# Patient Record
Sex: Female | Born: 1955
Health system: Southern US, Community
[De-identification: ages and names within clinical notes are randomized; demographics above are authoritative.]

## PROBLEM LIST (undated history)

## (undated) DIAGNOSIS — Z8042 Family history of malignant neoplasm of prostate: Secondary | ICD-10-CM

## (undated) DIAGNOSIS — F32A Depression, unspecified: Secondary | ICD-10-CM

## (undated) DIAGNOSIS — H9319 Tinnitus, unspecified ear: Secondary | ICD-10-CM

## (undated) DIAGNOSIS — J302 Other seasonal allergic rhinitis: Secondary | ICD-10-CM

## (undated) DIAGNOSIS — D751 Secondary polycythemia: Secondary | ICD-10-CM

## (undated) DIAGNOSIS — E6609 Other obesity due to excess calories: Secondary | ICD-10-CM

## (undated) DIAGNOSIS — K529 Noninfective gastroenteritis and colitis, unspecified: Secondary | ICD-10-CM

## (undated) DIAGNOSIS — E079 Disorder of thyroid, unspecified: Secondary | ICD-10-CM

## (undated) DIAGNOSIS — R5383 Other fatigue: Secondary | ICD-10-CM

## (undated) DIAGNOSIS — R61 Generalized hyperhidrosis: Secondary | ICD-10-CM

## (undated) DIAGNOSIS — T4145XA Adverse effect of unspecified anesthetic, initial encounter: Secondary | ICD-10-CM

## (undated) DIAGNOSIS — Z87440 Personal history of urinary (tract) infections: Secondary | ICD-10-CM

## (undated) DIAGNOSIS — Z803 Family history of malignant neoplasm of breast: Secondary | ICD-10-CM

## (undated) DIAGNOSIS — R112 Nausea with vomiting, unspecified: Secondary | ICD-10-CM

## (undated) DIAGNOSIS — Z8489 Family history of other specified conditions: Secondary | ICD-10-CM

## (undated) DIAGNOSIS — E039 Hypothyroidism, unspecified: Secondary | ICD-10-CM

## (undated) DIAGNOSIS — G4700 Insomnia, unspecified: Secondary | ICD-10-CM

## (undated) DIAGNOSIS — Z923 Personal history of irradiation: Secondary | ICD-10-CM

## (undated) DIAGNOSIS — I499 Cardiac arrhythmia, unspecified: Secondary | ICD-10-CM

## (undated) DIAGNOSIS — G8929 Other chronic pain: Secondary | ICD-10-CM

## (undated) DIAGNOSIS — I639 Cerebral infarction, unspecified: Secondary | ICD-10-CM

## (undated) DIAGNOSIS — K649 Unspecified hemorrhoids: Secondary | ICD-10-CM

## (undated) DIAGNOSIS — Z86718 Personal history of other venous thrombosis and embolism: Secondary | ICD-10-CM

## (undated) DIAGNOSIS — R2689 Other abnormalities of gait and mobility: Secondary | ICD-10-CM

## (undated) DIAGNOSIS — R296 Repeated falls: Secondary | ICD-10-CM

## (undated) DIAGNOSIS — M1990 Unspecified osteoarthritis, unspecified site: Secondary | ICD-10-CM

## (undated) DIAGNOSIS — Z87898 Personal history of other specified conditions: Secondary | ICD-10-CM

## (undated) DIAGNOSIS — Z9289 Personal history of other medical treatment: Secondary | ICD-10-CM

## (undated) DIAGNOSIS — Z72820 Sleep deprivation: Secondary | ICD-10-CM

## (undated) DIAGNOSIS — F419 Anxiety disorder, unspecified: Secondary | ICD-10-CM

## (undated) DIAGNOSIS — J189 Pneumonia, unspecified organism: Secondary | ICD-10-CM

## (undated) DIAGNOSIS — Z8619 Personal history of other infectious and parasitic diseases: Secondary | ICD-10-CM

## (undated) DIAGNOSIS — M545 Low back pain, unspecified: Secondary | ICD-10-CM

## (undated) DIAGNOSIS — Z9889 Other specified postprocedural states: Secondary | ICD-10-CM

## (undated) DIAGNOSIS — Z8709 Personal history of other diseases of the respiratory system: Secondary | ICD-10-CM

## (undated) DIAGNOSIS — Z87442 Personal history of urinary calculi: Secondary | ICD-10-CM

## (undated) DIAGNOSIS — G459 Transient cerebral ischemic attack, unspecified: Secondary | ICD-10-CM

## (undated) DIAGNOSIS — T8859XA Other complications of anesthesia, initial encounter: Secondary | ICD-10-CM

## (undated) DIAGNOSIS — W19XXXA Unspecified fall, initial encounter: Secondary | ICD-10-CM

## (undated) DIAGNOSIS — F329 Major depressive disorder, single episode, unspecified: Secondary | ICD-10-CM

## (undated) DIAGNOSIS — G4730 Sleep apnea, unspecified: Secondary | ICD-10-CM

## (undated) DIAGNOSIS — Z8719 Personal history of other diseases of the digestive system: Secondary | ICD-10-CM

## (undated) DIAGNOSIS — I10 Essential (primary) hypertension: Secondary | ICD-10-CM

## (undated) DIAGNOSIS — T7840XA Allergy, unspecified, initial encounter: Secondary | ICD-10-CM

## (undated) DIAGNOSIS — C801 Malignant (primary) neoplasm, unspecified: Secondary | ICD-10-CM

## (undated) HISTORY — DX: Depression, unspecified: F32.A

## (undated) HISTORY — PX: BACK SURGERY: SHX140

## (undated) HISTORY — DX: Allergy, unspecified, initial encounter: T78.40XA

## (undated) HISTORY — PX: BREAST BIOPSY: SHX20

## (undated) HISTORY — PX: TONSILLECTOMY: SUR1361

## (undated) HISTORY — DX: Other chronic pain: G89.29

## (undated) HISTORY — DX: Cerebral infarction, unspecified: I63.9

## (undated) HISTORY — DX: Other obesity due to excess calories: E66.09

## (undated) HISTORY — PX: OTHER SURGICAL HISTORY: SHX169

## (undated) HISTORY — PX: CHOLECYSTECTOMY: SHX55

## (undated) HISTORY — DX: Low back pain, unspecified: M54.50

## (undated) HISTORY — DX: Other seasonal allergic rhinitis: J30.2

## (undated) HISTORY — DX: Sleep apnea, unspecified: G47.30

## (undated) HISTORY — DX: Sleep deprivation: Z72.820

## (undated) HISTORY — PX: ABDOMINAL HYSTERECTOMY: SHX81

## (undated) HISTORY — PX: FRACTURE SURGERY: SHX138

## (undated) HISTORY — DX: Family history of malignant neoplasm of prostate: Z80.42

## (undated) HISTORY — DX: Family history of malignant neoplasm of breast: Z80.3

## (undated) HISTORY — PX: KNEE SURGERY: SHX244

## (undated) HISTORY — DX: Anxiety disorder, unspecified: F41.9

## (undated) HISTORY — DX: Noninfective gastroenteritis and colitis, unspecified: K52.9

## (undated) HISTORY — PX: BREAST LUMPECTOMY: SHX2

## (undated) HISTORY — PX: JOINT REPLACEMENT: SHX530

## (undated) HISTORY — PX: SPINE SURGERY: SHX786

---
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
## 1997-11-04 ENCOUNTER — Ambulatory Visit (HOSPITAL_BASED_OUTPATIENT_CLINIC_OR_DEPARTMENT_OTHER): Admission: RE | Admit: 1997-11-04 | Discharge: 1997-11-04 | Payer: Self-pay | Admitting: Orthopedic Surgery

## 1999-02-02 ENCOUNTER — Other Ambulatory Visit: Admission: RE | Admit: 1999-02-02 | Discharge: 1999-02-02 | Payer: Self-pay | Admitting: Family Medicine

## 1999-04-05 ENCOUNTER — Ambulatory Visit (HOSPITAL_COMMUNITY): Admission: RE | Admit: 1999-04-05 | Discharge: 1999-04-05 | Payer: Self-pay | Admitting: Gastroenterology

## 1999-09-13 ENCOUNTER — Other Ambulatory Visit: Admission: RE | Admit: 1999-09-13 | Discharge: 1999-09-13 | Payer: Self-pay | Admitting: Orthopedic Surgery

## 2000-10-14 ENCOUNTER — Encounter: Payer: Self-pay | Admitting: Emergency Medicine

## 2000-10-14 ENCOUNTER — Inpatient Hospital Stay (HOSPITAL_COMMUNITY): Admission: EM | Admit: 2000-10-14 | Discharge: 2000-10-15 | Payer: Self-pay | Admitting: Emergency Medicine

## 2001-01-02 ENCOUNTER — Encounter: Payer: Self-pay | Admitting: Emergency Medicine

## 2001-01-02 ENCOUNTER — Emergency Department (HOSPITAL_COMMUNITY): Admission: EM | Admit: 2001-01-02 | Discharge: 2001-01-02 | Payer: Self-pay | Admitting: Emergency Medicine

## 2001-01-17 ENCOUNTER — Encounter: Payer: Self-pay | Admitting: Emergency Medicine

## 2001-01-17 ENCOUNTER — Emergency Department (HOSPITAL_COMMUNITY): Admission: EM | Admit: 2001-01-17 | Discharge: 2001-01-18 | Payer: Self-pay | Admitting: Emergency Medicine

## 2001-01-18 ENCOUNTER — Encounter: Payer: Self-pay | Admitting: Emergency Medicine

## 2001-01-24 ENCOUNTER — Encounter: Payer: Self-pay | Admitting: Specialist

## 2001-01-24 ENCOUNTER — Encounter: Admission: RE | Admit: 2001-01-24 | Discharge: 2001-01-24 | Payer: Self-pay | Admitting: Specialist

## 2001-02-08 ENCOUNTER — Encounter: Payer: Self-pay | Admitting: Orthopedic Surgery

## 2001-02-09 ENCOUNTER — Inpatient Hospital Stay (HOSPITAL_COMMUNITY): Admission: RE | Admit: 2001-02-09 | Discharge: 2001-02-10 | Payer: Self-pay | Admitting: Orthopedic Surgery

## 2001-02-16 ENCOUNTER — Ambulatory Visit (HOSPITAL_COMMUNITY): Admission: RE | Admit: 2001-02-16 | Discharge: 2001-02-16 | Payer: Self-pay | Admitting: Cardiology

## 2001-02-25 ENCOUNTER — Inpatient Hospital Stay (HOSPITAL_COMMUNITY): Admission: EM | Admit: 2001-02-25 | Discharge: 2001-03-01 | Payer: Self-pay | Admitting: Emergency Medicine

## 2001-03-13 ENCOUNTER — Ambulatory Visit (HOSPITAL_COMMUNITY): Admission: RE | Admit: 2001-03-13 | Discharge: 2001-03-13 | Payer: Self-pay | Admitting: Cardiology

## 2001-04-16 ENCOUNTER — Encounter: Admission: RE | Admit: 2001-04-16 | Discharge: 2001-04-16 | Payer: Self-pay | Admitting: *Deleted

## 2001-04-16 ENCOUNTER — Encounter: Payer: Self-pay | Admitting: *Deleted

## 2001-05-14 ENCOUNTER — Encounter: Admission: RE | Admit: 2001-05-14 | Discharge: 2001-05-14 | Payer: Self-pay | Admitting: *Deleted

## 2001-05-14 ENCOUNTER — Encounter: Payer: Self-pay | Admitting: *Deleted

## 2001-05-19 ENCOUNTER — Emergency Department (HOSPITAL_COMMUNITY): Admission: EM | Admit: 2001-05-19 | Discharge: 2001-05-19 | Payer: Self-pay | Admitting: Emergency Medicine

## 2001-05-20 ENCOUNTER — Ambulatory Visit (HOSPITAL_COMMUNITY): Admission: RE | Admit: 2001-05-20 | Discharge: 2001-05-20 | Payer: Self-pay | Admitting: Emergency Medicine

## 2001-05-20 ENCOUNTER — Encounter: Payer: Self-pay | Admitting: Emergency Medicine

## 2001-07-27 ENCOUNTER — Encounter: Payer: Self-pay | Admitting: *Deleted

## 2001-07-27 ENCOUNTER — Encounter: Admission: RE | Admit: 2001-07-27 | Discharge: 2001-07-27 | Payer: Self-pay | Admitting: *Deleted

## 2001-09-05 ENCOUNTER — Encounter: Payer: Self-pay | Admitting: Physical Medicine and Rehabilitation

## 2001-09-05 ENCOUNTER — Encounter
Admission: RE | Admit: 2001-09-05 | Discharge: 2001-09-05 | Payer: Self-pay | Admitting: Physical Medicine and Rehabilitation

## 2001-11-28 ENCOUNTER — Ambulatory Visit (HOSPITAL_COMMUNITY): Admission: RE | Admit: 2001-11-28 | Discharge: 2001-11-28 | Payer: Self-pay | Admitting: Internal Medicine

## 2001-12-08 ENCOUNTER — Emergency Department: Admission: EM | Admit: 2001-12-08 | Discharge: 2001-12-09 | Payer: Self-pay

## 2001-12-19 ENCOUNTER — Encounter
Admission: RE | Admit: 2001-12-19 | Discharge: 2001-12-19 | Payer: Self-pay | Admitting: Physical Medicine and Rehabilitation

## 2001-12-19 ENCOUNTER — Encounter: Payer: Self-pay | Admitting: Physical Medicine and Rehabilitation

## 2002-02-25 ENCOUNTER — Encounter: Payer: Self-pay | Admitting: Specialist

## 2002-02-25 ENCOUNTER — Inpatient Hospital Stay (HOSPITAL_COMMUNITY): Admission: RE | Admit: 2002-02-25 | Discharge: 2002-02-28 | Payer: Self-pay | Admitting: Specialist

## 2002-03-11 ENCOUNTER — Emergency Department (HOSPITAL_COMMUNITY): Admission: EM | Admit: 2002-03-11 | Discharge: 2002-03-11 | Payer: Self-pay | Admitting: Emergency Medicine

## 2002-09-03 ENCOUNTER — Emergency Department (HOSPITAL_COMMUNITY): Admission: EM | Admit: 2002-09-03 | Discharge: 2002-09-03 | Payer: Self-pay | Admitting: Emergency Medicine

## 2002-09-17 ENCOUNTER — Ambulatory Visit (HOSPITAL_COMMUNITY): Admission: RE | Admit: 2002-09-17 | Discharge: 2002-09-17 | Payer: Self-pay | Admitting: Gastroenterology

## 2003-03-12 ENCOUNTER — Observation Stay (HOSPITAL_COMMUNITY): Admission: RE | Admit: 2003-03-12 | Discharge: 2003-03-13 | Payer: Self-pay | Admitting: Specialist

## 2003-03-12 ENCOUNTER — Encounter: Payer: Self-pay | Admitting: Specialist

## 2003-06-23 ENCOUNTER — Encounter: Admission: RE | Admit: 2003-06-23 | Discharge: 2003-06-23 | Payer: Self-pay | Admitting: Specialist

## 2003-08-06 ENCOUNTER — Inpatient Hospital Stay (HOSPITAL_COMMUNITY): Admission: RE | Admit: 2003-08-06 | Discharge: 2003-08-11 | Payer: Self-pay | Admitting: Specialist

## 2004-01-13 ENCOUNTER — Observation Stay (HOSPITAL_COMMUNITY): Admission: EM | Admit: 2004-01-13 | Discharge: 2004-01-14 | Payer: Self-pay | Admitting: Emergency Medicine

## 2004-07-02 ENCOUNTER — Encounter: Admission: RE | Admit: 2004-07-02 | Discharge: 2004-07-02 | Payer: Self-pay | Admitting: Specialist

## 2004-10-12 ENCOUNTER — Other Ambulatory Visit: Admission: RE | Admit: 2004-10-12 | Discharge: 2004-10-12 | Payer: Self-pay | Admitting: Internal Medicine

## 2004-10-25 ENCOUNTER — Inpatient Hospital Stay (HOSPITAL_COMMUNITY): Admission: RE | Admit: 2004-10-25 | Discharge: 2004-10-27 | Payer: Self-pay | Admitting: Specialist

## 2005-02-11 ENCOUNTER — Ambulatory Visit (HOSPITAL_COMMUNITY): Admission: RE | Admit: 2005-02-11 | Discharge: 2005-02-11 | Payer: Self-pay | Admitting: Internal Medicine

## 2005-03-08 ENCOUNTER — Observation Stay (HOSPITAL_COMMUNITY): Admission: EM | Admit: 2005-03-08 | Discharge: 2005-03-10 | Payer: Self-pay | Admitting: Emergency Medicine

## 2005-03-08 ENCOUNTER — Ambulatory Visit: Payer: Self-pay | Admitting: Family Medicine

## 2005-03-09 ENCOUNTER — Encounter: Payer: Self-pay | Admitting: Cardiology

## 2005-03-09 ENCOUNTER — Ambulatory Visit: Payer: Self-pay | Admitting: Cardiology

## 2005-08-31 ENCOUNTER — Encounter: Admission: RE | Admit: 2005-08-31 | Discharge: 2005-08-31 | Payer: Self-pay | Admitting: Specialist

## 2005-09-27 ENCOUNTER — Encounter: Admission: RE | Admit: 2005-09-27 | Discharge: 2005-09-27 | Payer: Self-pay | Admitting: Specialist

## 2005-10-19 ENCOUNTER — Ambulatory Visit (HOSPITAL_COMMUNITY): Admission: RE | Admit: 2005-10-19 | Discharge: 2005-10-19 | Payer: Self-pay | Admitting: Internal Medicine

## 2006-07-05 ENCOUNTER — Emergency Department (HOSPITAL_COMMUNITY): Admission: EM | Admit: 2006-07-05 | Discharge: 2006-07-05 | Payer: Self-pay | Admitting: Emergency Medicine

## 2006-08-09 ENCOUNTER — Encounter: Admission: RE | Admit: 2006-08-09 | Discharge: 2006-08-09 | Payer: Self-pay | Admitting: Neurosurgery

## 2006-10-26 ENCOUNTER — Ambulatory Visit (HOSPITAL_COMMUNITY): Admission: RE | Admit: 2006-10-26 | Discharge: 2006-10-26 | Payer: Self-pay | Admitting: Internal Medicine

## 2006-11-07 ENCOUNTER — Encounter: Payer: Self-pay | Admitting: Cardiovascular Disease

## 2006-11-07 ENCOUNTER — Ambulatory Visit: Payer: Self-pay

## 2006-11-13 ENCOUNTER — Other Ambulatory Visit: Admission: RE | Admit: 2006-11-13 | Discharge: 2006-11-13 | Payer: Self-pay | Admitting: Internal Medicine

## 2006-12-21 ENCOUNTER — Ambulatory Visit (HOSPITAL_COMMUNITY): Admission: RE | Admit: 2006-12-21 | Discharge: 2006-12-21 | Payer: Self-pay | Admitting: Internal Medicine

## 2006-12-21 ENCOUNTER — Ambulatory Visit: Payer: Self-pay | Admitting: Vascular Surgery

## 2007-05-23 ENCOUNTER — Ambulatory Visit (HOSPITAL_COMMUNITY): Admission: RE | Admit: 2007-05-23 | Discharge: 2007-05-23 | Payer: Self-pay | Admitting: Internal Medicine

## 2007-06-11 ENCOUNTER — Ambulatory Visit: Payer: Self-pay | Admitting: Cardiology

## 2007-06-11 ENCOUNTER — Observation Stay (HOSPITAL_COMMUNITY): Admission: EM | Admit: 2007-06-11 | Discharge: 2007-06-12 | Payer: Self-pay | Admitting: Emergency Medicine

## 2007-06-18 ENCOUNTER — Ambulatory Visit: Payer: Self-pay | Admitting: Cardiology

## 2007-06-18 ENCOUNTER — Ambulatory Visit: Payer: Self-pay | Admitting: Pulmonary Disease

## 2007-06-22 ENCOUNTER — Telehealth: Payer: Self-pay | Admitting: Pulmonary Disease

## 2007-06-26 ENCOUNTER — Encounter: Payer: Self-pay | Admitting: Pulmonary Disease

## 2007-06-26 DIAGNOSIS — F329 Major depressive disorder, single episode, unspecified: Secondary | ICD-10-CM | POA: Insufficient documentation

## 2007-06-26 DIAGNOSIS — T7840XA Allergy, unspecified, initial encounter: Secondary | ICD-10-CM | POA: Insufficient documentation

## 2007-06-26 DIAGNOSIS — K644 Residual hemorrhoidal skin tags: Secondary | ICD-10-CM

## 2007-06-26 DIAGNOSIS — K5730 Diverticulosis of large intestine without perforation or abscess without bleeding: Secondary | ICD-10-CM | POA: Insufficient documentation

## 2007-06-26 DIAGNOSIS — F32A Depression, unspecified: Secondary | ICD-10-CM | POA: Insufficient documentation

## 2007-06-26 HISTORY — DX: Residual hemorrhoidal skin tags: K64.4

## 2008-09-18 IMAGING — CT CT ANGIO CHEST
2 of 4 series · 19 of 46 positions shown · IV contrast (omnipaque)
Comparison: None.

CLINICAL DATA: Shortness of breath, left chest pain, substernal pressure, cough, wheezing, and lower extremity edema.
CT ANGIOGRAPHY OF CHEST:
TECHNIQUE: Multidetector CT imaging of the chest was performed during bolus injection of intravenous contrast.  Multiplanar CT angiographic image reconstructions were generated to evaluate the vascular anatomy.
Contrast:  80 cc Omnipaque 300

[Series 5: pe_xxl 1.0 b20f st · axial · 0.76mm/px · z∈[-281,-29]mm · 16 of 278 slices shown]
[im 13/278  lung]
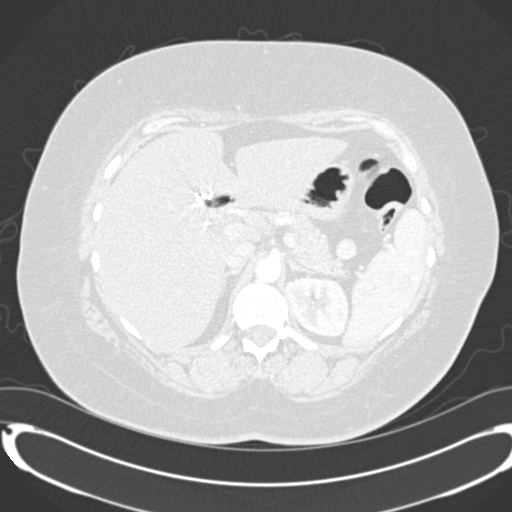
[im 37/278  soft-tissue]
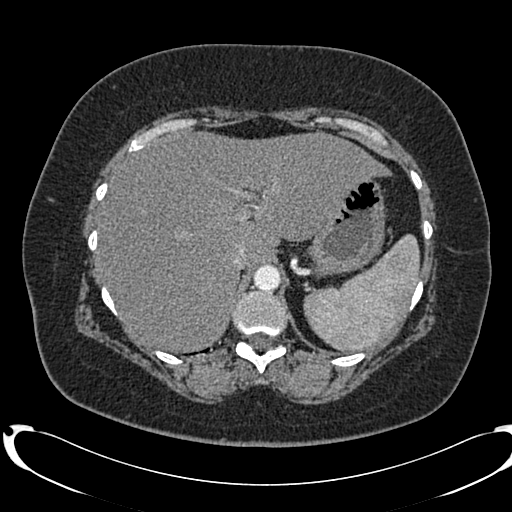
[im 49/278  lung]
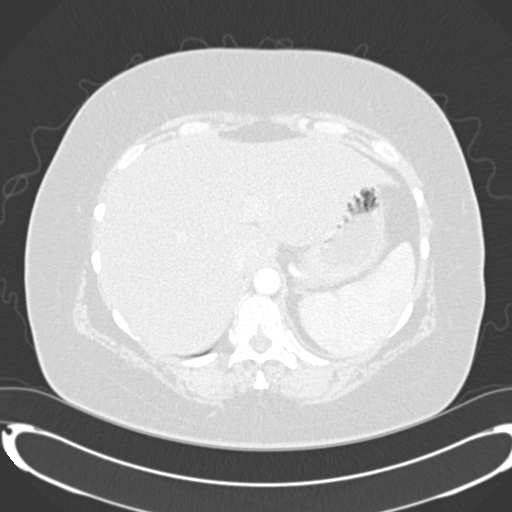
[im 61/278  soft-tissue]
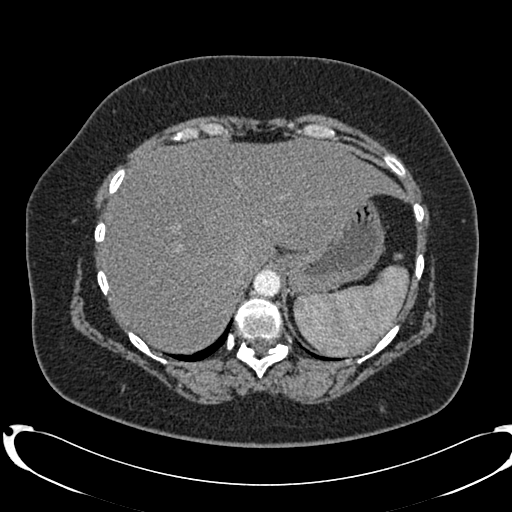
[im 85/278  lung]
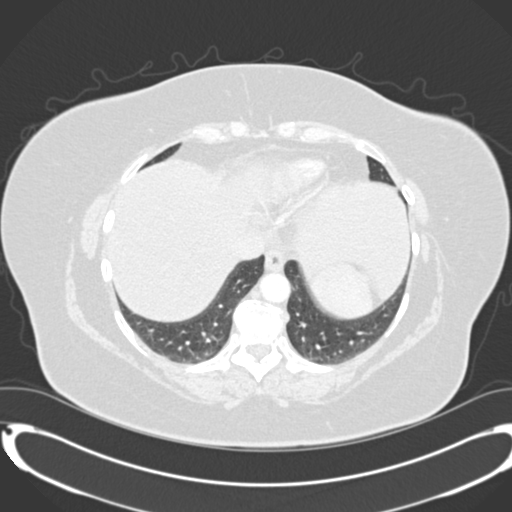
[im 97/278  soft-tissue]
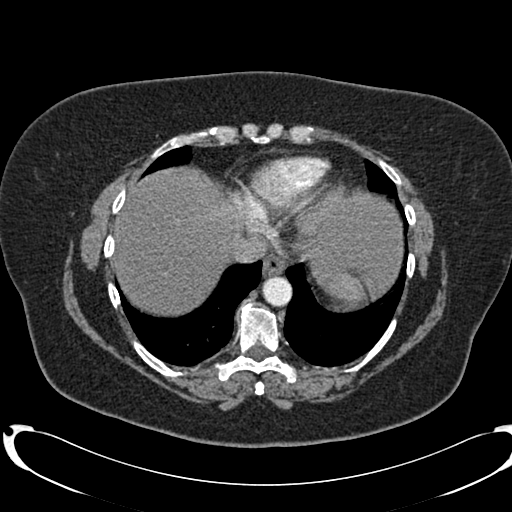
[im 109/278  lung]
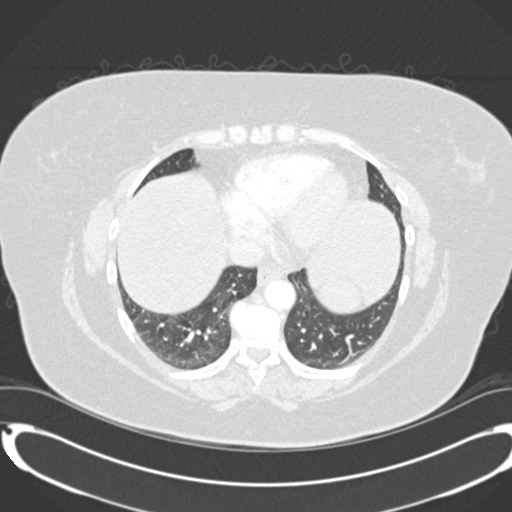
[im 133/278  soft-tissue]
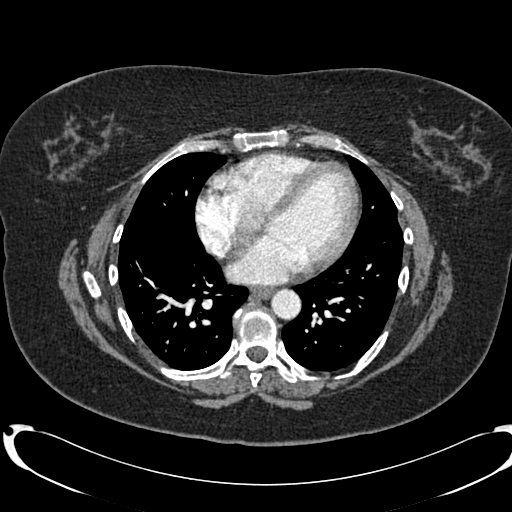
[im 145/278  lung]
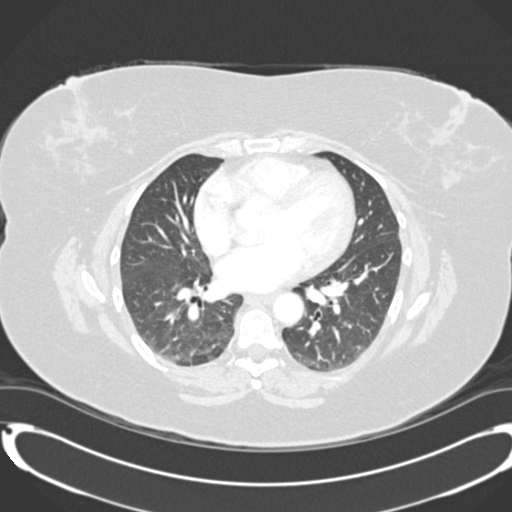
[im 169/278  soft-tissue]
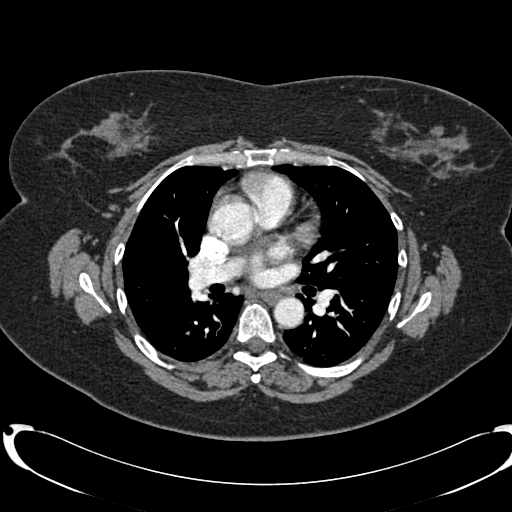
[im 181/278  lung]
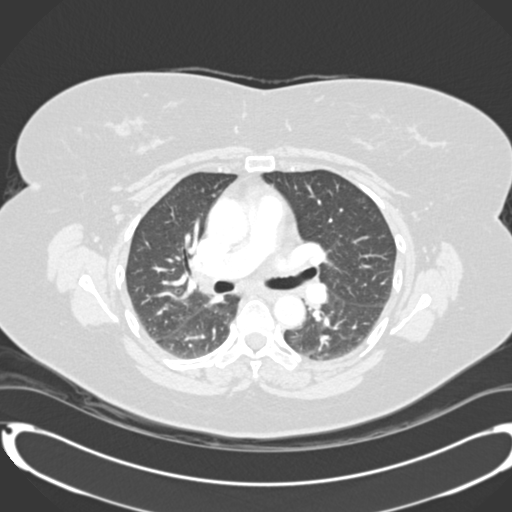
[im 193/278  soft-tissue]
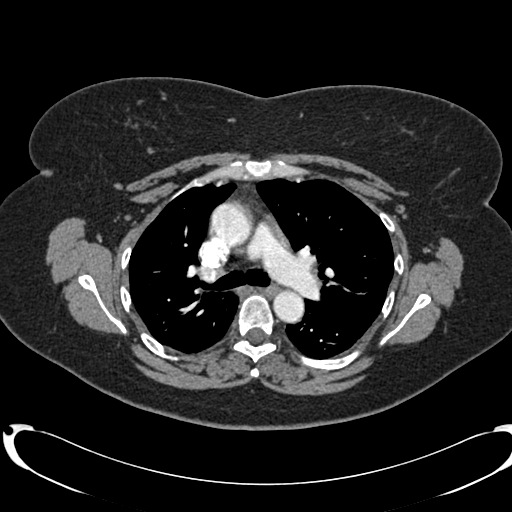
[im 217/278  lung]
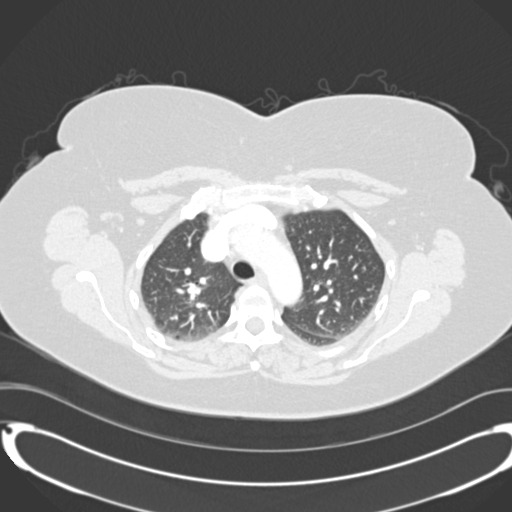
[im 229/278  soft-tissue]
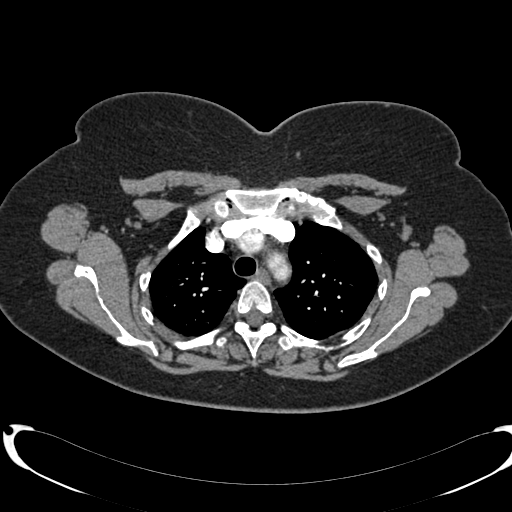
[im 241/278  lung]
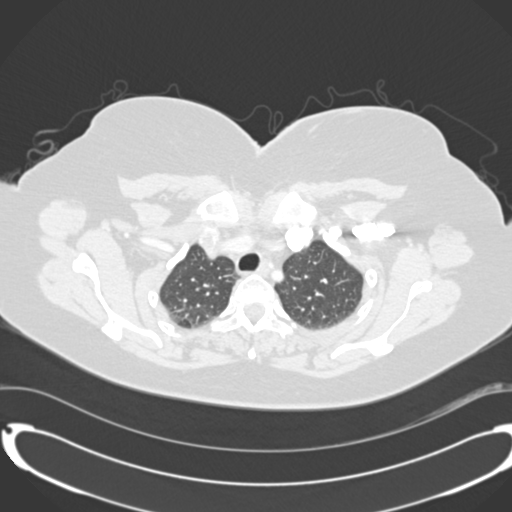
[im 265/278  soft-tissue]
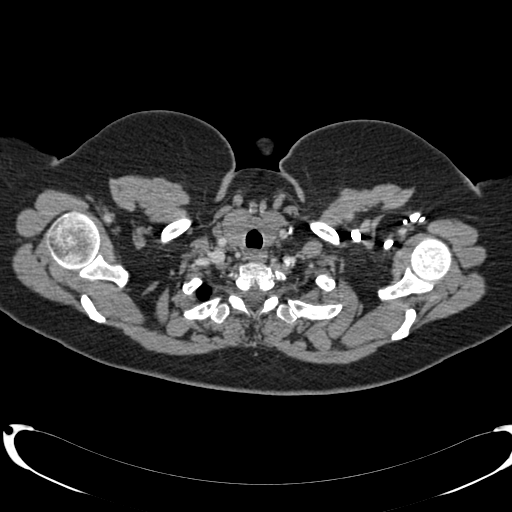

[Series 602: <mpr thick range> · coronal · 0.76mm/px · 3 of 108 slices shown]
[im 36/108  soft-tissue]
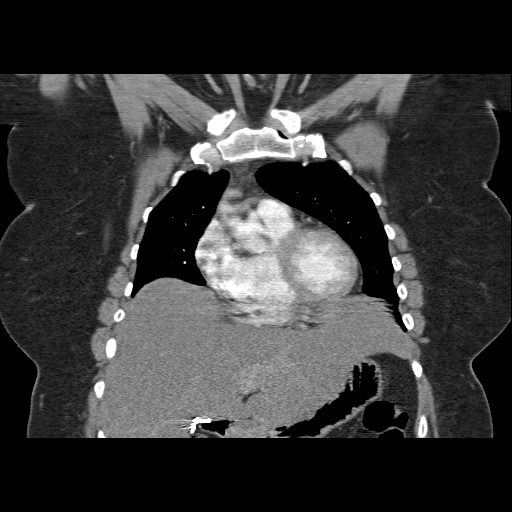
[im 48/108  soft-tissue]
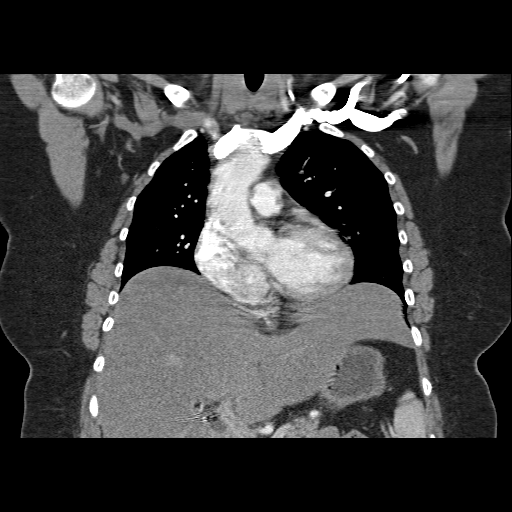
[im 60/108  soft-tissue]
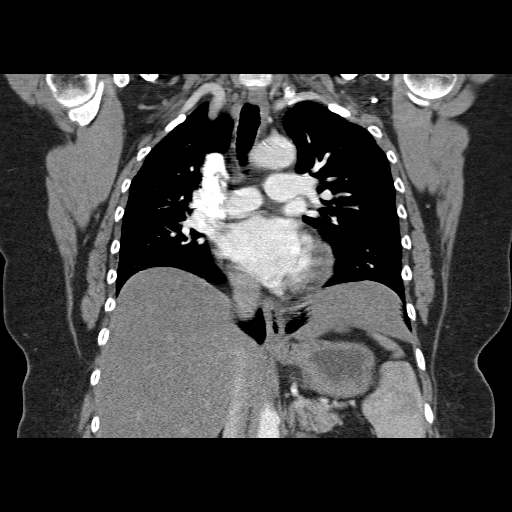

[19 of 46 positions shown; findings below may reference images not displayed]

FINDINGS: No pulmonary embolus.  No pathologically enlarged mediastinal, hilar, or axillary lymph nodes.  Lymphoid tissue is seen in the right hilar region. Heart size is mildly enlarged.  No pericardial effusion.
Mild basilar atelectasis.  Lungs are otherwise clear.  No pleural fluid.  Airway unremarkable.
Incidental imaging of the upper abdomen shows low attenuation throughout the visualized liver.   Cholecystectomy.  Extrahepatic bile duct measures approximately 13 mm.  This is likely related to cholecystectomy.  No worrisome lytic or sclerotic lesion.
IMPRESSION: No pulmonary embolus.  No findings to explain the patient?s given symptoms.
Fatty liver.

## 2008-11-12 ENCOUNTER — Ambulatory Visit: Payer: Self-pay | Admitting: Thoracic Surgery

## 2009-08-22 ENCOUNTER — Emergency Department (HOSPITAL_COMMUNITY): Admission: EM | Admit: 2009-08-22 | Discharge: 2009-08-22 | Payer: Self-pay | Admitting: Family Medicine

## 2009-08-22 ENCOUNTER — Emergency Department (HOSPITAL_COMMUNITY): Admission: EM | Admit: 2009-08-22 | Discharge: 2009-08-23 | Payer: Self-pay | Admitting: Emergency Medicine

## 2009-08-23 ENCOUNTER — Ambulatory Visit: Payer: Self-pay | Admitting: Vascular Surgery

## 2009-08-23 ENCOUNTER — Ambulatory Visit (HOSPITAL_COMMUNITY): Admission: RE | Admit: 2009-08-23 | Discharge: 2009-08-23 | Payer: Self-pay | Admitting: Emergency Medicine

## 2009-08-23 ENCOUNTER — Encounter (INDEPENDENT_AMBULATORY_CARE_PROVIDER_SITE_OTHER): Payer: Self-pay | Admitting: Emergency Medicine

## 2009-09-25 ENCOUNTER — Ambulatory Visit (HOSPITAL_BASED_OUTPATIENT_CLINIC_OR_DEPARTMENT_OTHER): Admission: RE | Admit: 2009-09-25 | Discharge: 2009-09-25 | Payer: Self-pay | Admitting: Specialist

## 2009-11-02 ENCOUNTER — Encounter (INDEPENDENT_AMBULATORY_CARE_PROVIDER_SITE_OTHER): Payer: Self-pay | Admitting: *Deleted

## 2009-12-13 ENCOUNTER — Ambulatory Visit (HOSPITAL_COMMUNITY): Admission: RE | Admit: 2009-12-13 | Discharge: 2009-12-13 | Payer: Self-pay | Admitting: Psychiatry

## 2009-12-13 ENCOUNTER — Emergency Department (HOSPITAL_COMMUNITY): Admission: EM | Admit: 2009-12-13 | Discharge: 2009-12-14 | Payer: Self-pay | Admitting: Emergency Medicine

## 2010-05-13 ENCOUNTER — Emergency Department (HOSPITAL_COMMUNITY): Admission: EM | Admit: 2010-05-13 | Discharge: 2010-05-13 | Payer: Self-pay | Admitting: Emergency Medicine

## 2010-07-05 ENCOUNTER — Encounter
Admission: RE | Admit: 2010-07-05 | Discharge: 2010-07-05 | Payer: Self-pay | Source: Home / Self Care | Attending: Internal Medicine | Admitting: Internal Medicine

## 2010-08-15 ENCOUNTER — Encounter: Payer: Self-pay | Admitting: Internal Medicine

## 2010-08-24 NOTE — Progress Notes (Signed)
Summary: to discuss test results.  Phone Note Other Incoming Call back at lab test review   Call placed by: Barbaraann Share MD,  June 22, 2007 5:08 PM Call placed to: Patient Reason for Call: Discuss lab or test results Summary of Call: I have called pt on multiple occasions and have left message on her machine to call us with a number where she can be reached to discuss lab test results.

## 2010-08-24 NOTE — Letter (Signed)
Summary: Colonoscopy Date Change Letter  [facility]  [address] [place], [place] [address]   Phone: [phone]  Fax: [phone]      November 02, 2009 MRN: [id number]   Carol [name] [address] [place], [place]  [address]   Dear Ms. Ordway,   Previously you were recommended to have a repeat colonoscopy around this time. Your chart was recently reviewed by Dr. Judie Petit T. Russella Dar of [facility]. Follow up colonoscopy is now recommended in April 2014. This revised recommendation is based on current, nationally recognized guidelines for colorectal cancer screening and polyp surveillance. These guidelines are endorsed by the American Cancer Society, [employer] on Colorectal Cancer as well as numerous other major medical organizations.  Please understand that our recommendation assumes that you do not have any new symptoms such as bleeding, a change in bowel habits, anemia, or significant abdominal discomfort. If you do have any concerning GI symptoms or want to discuss the guideline recommendations, please call to arrange an office visit at your earliest convenience. Otherwise we will keep you in our reminder system and contact you 1-2 months prior to the date listed above to schedule your next colonoscopy.  Thank you,  Judie Petit T. Russella Dar, M.D.  [facility] [phone]

## 2010-10-10 LAB — CBC
HCT: 37 % (ref 36.0–46.0)
Hemoglobin: 12.5 g/dL (ref 12.0–15.0)
MCHC: 33.8 g/dL (ref 30.0–36.0)
MCV: 91.2 fL (ref 78.0–100.0)
Platelets: 341 10*3/uL (ref 150–400)
RBC: 4.06 MIL/uL (ref 3.87–5.11)
RDW: 13.6 % (ref 11.5–15.5)
WBC: 9.5 10*3/uL (ref 4.0–10.5)

## 2010-10-10 LAB — POCT I-STAT, CHEM 8
BUN: 6 mg/dL (ref 6–23)
Calcium, Ion: 1.05 mmol/L — ABNORMAL LOW (ref 1.12–1.32)
Chloride: 99 mEq/L (ref 96–112)
Creatinine, Ser: 0.7 mg/dL (ref 0.4–1.2)
Glucose, Bld: 112 mg/dL — ABNORMAL HIGH (ref 70–99)
HCT: 39 % (ref 36.0–46.0)
Hemoglobin: 13.3 g/dL (ref 12.0–15.0)
Potassium: 3.9 mEq/L (ref 3.5–5.1)
Sodium: 136 mEq/L (ref 135–145)
TCO2: 32 mmol/L (ref 0–100)

## 2010-10-10 LAB — PROTIME-INR
INR: 0.91 (ref 0.00–1.49)
Prothrombin Time: 12.2 seconds (ref 11.6–15.2)

## 2010-10-10 LAB — APTT: aPTT: 34 seconds (ref 24–37)

## 2010-10-10 LAB — DIFFERENTIAL
Basophils Absolute: 0.2 10*3/uL — ABNORMAL HIGH (ref 0.0–0.1)
Basophils Relative: 2 % — ABNORMAL HIGH (ref 0–1)
Eosinophils Absolute: 0.2 10*3/uL (ref 0.0–0.7)
Eosinophils Relative: 3 % (ref 0–5)
Lymphocytes Relative: 30 % (ref 12–46)
Lymphs Abs: 2.8 10*3/uL (ref 0.7–4.0)
Monocytes Absolute: 0.6 10*3/uL (ref 0.1–1.0)
Monocytes Relative: 6 % (ref 3–12)
Neutro Abs: 5.7 10*3/uL (ref 1.7–7.7)
Neutrophils Relative %: 60 % (ref 43–77)

## 2010-10-11 LAB — RAPID URINE DRUG SCREEN, HOSP PERFORMED
Amphetamines: NOT DETECTED
Barbiturates: NOT DETECTED
Benzodiazepines: POSITIVE — AB
Cocaine: NOT DETECTED
Opiates: POSITIVE — AB
Tetrahydrocannabinol: NOT DETECTED

## 2010-10-11 LAB — ETHANOL: Alcohol, Ethyl (B): <5 mg/dL (ref 0–10)

## 2010-10-11 LAB — BASIC METABOLIC PANEL
BUN: 10 mg/dL (ref 6–23)
CO2: 29 mEq/L (ref 19–32)
Calcium: 9.3 mg/dL (ref 8.4–10.5)
Chloride: 105 mEq/L (ref 96–112)
Creatinine, Ser: 1.02 mg/dL (ref 0.4–1.2)
GFR calc Af Amer: >60 mL/min (ref >60)
GFR calc non Af Amer: 57 mL/min — ABNORMAL LOW (ref >60)
Glucose, Bld: 99 mg/dL (ref 70–99)
Potassium: 3.6 mEq/L (ref 3.5–5.1)
Sodium: 142 mEq/L (ref 135–145)

## 2010-10-11 LAB — CBC
HCT: 42.2 % (ref 36.0–46.0)
Hemoglobin: 13.9 g/dL (ref 12.0–15.0)
MCHC: 32.9 g/dL (ref 30.0–36.0)
MCV: 88.5 fL (ref 78.0–100.0)
Platelets: 413 10*3/uL — ABNORMAL HIGH (ref 150–400)
RBC: 4.76 MIL/uL (ref 3.87–5.11)
RDW: 14.1 % (ref 11.5–15.5)
WBC: 10.1 10*3/uL (ref 4.0–10.5)

## 2010-10-11 LAB — DIFFERENTIAL
Basophils Absolute: 0 10*3/uL (ref 0.0–0.1)
Basophils Relative: 0 % (ref 0–1)
Eosinophils Absolute: 0.2 10*3/uL (ref 0.0–0.7)
Eosinophils Relative: 2 % (ref 0–5)
Lymphocytes Relative: 30 % (ref 12–46)
Lymphs Abs: 3.1 10*3/uL (ref 0.7–4.0)
Monocytes Absolute: 0.6 10*3/uL (ref 0.1–1.0)
Monocytes Relative: 6 % (ref 3–12)
Neutro Abs: 6.2 10*3/uL (ref 1.7–7.7)
Neutrophils Relative %: 62 % (ref 43–77)

## 2010-10-11 LAB — TRICYCLICS SCREEN, URINE: TCA Scrn: NOT DETECTED

## 2010-10-18 LAB — POCT HEMOGLOBIN-HEMACUE: Hemoglobin: 13.4 g/dL (ref 12.0–15.0)

## 2010-12-07 NOTE — Assessment & Plan Note (Signed)
[facility]                             PULMONARY OFFICE NOTE   Carol [name], Carol [name]                         MRN:          [id number]  DATE:06/18/2007                            DOB:          1955/12/03    HISTORY OF PRESENT ILLNESS:  Patient is a 55 year old white female who I  have been asked to see for dyspnea.  Patient states that she has been  short of breath for the last seven weeks and has recently had a  hospitalization where she was having shortness of breath and atypical  chest pain and apparently had a negative cardiac workup, by her  description.  The patient has had intermittent lower extremity edema  since March of this year and has been treated with diuretics.  She  currently describes less than one block dyspnea on exertion at a  moderate pace and will get short of breath vacuuming one room of her  house or bringing groceries in from her car.  The patient has noticed it  quite a bit with walking stairs or trying to walk and talk at the same  time.  She feels this is getting worse.  She states that her weight can  go up and down 10 pounds fairly rapidly because of her lower extremity  edema.  She has been having atypical chest pain that she describes as  both sharp and dull and is always associated with the shortness of  breath.  She has had minimal cough that is dry in nature.  She has had  negative lower extremity Dopplers in April of this year.   PAST MEDICAL HISTORY:  1. Chronic headaches.  2. History of hypothyroidism with the recently normal TSH level.  3. Status post cholecystectomy.  4. History of multiple spine surgeries.  5. Status post hysterectomy and appendectomy.   CURRENT MEDICATIONS:  Synthroid 0.125 mg daily.   Patient is allergic to PENICILLIN and CODEINE.   SOCIAL HISTORY:  She is married.  She has never smoked.  She is self  employed and lives with her husband and children.   FAMILY HISTORY:  Remarkable for her  mother having had emphysema.  Father  had a myocardial infarction.  Otherwise, noncontributory in first-degree  relatives.   REVIEW OF SYSTEMS:  As per history of present illness.   PHYSICAL EXAMINATION:  GENERAL:  She is a morbidly obese female in no  acute distress.  Blood pressure is 128/80, pulse 67, temperature 98.2.  Weight is 245  pounds.  She is 5 feet 7 inches tall.  O2 saturation on room air is 97%.  HEENT:  Pupils are equal, round and reactive to light and accommodation.  Extraocular muscles are intact.  Nares are patent without discharge.  Oropharynx is clear.  NECK:  Supple without JVD or lymphadenopathy.  There is no palpable  thyromegaly.  CHEST:  Totally clear to auscultation with no wheezes or rhonchi.  CARDIAC:  Regular rate and rhythm with no murmurs, rubs or gallops.  ABDOMEN:  Soft and nontender with good bowel sounds.  GENITAL/BREAST/RECTAL:  Not  done, not indicated.  LOWER EXTREMITIES:  Without significant edema.  Pulses are intact  distally.  NEUROLOGIC:  Alert and oriented with no obvious motor deficits.   LABORATORY DATA:  Patient had recent arterial blood gas with a pO2 of  77, pCO2 37, pH 7.42 on room air.  She had full PFTs with no evidence of  air flow obstruction; however, the technique was very poor, secondary to  short expiratory time; however, there was no indication of obstruction,  even on her flow volume loop.  Lung volumes did show very mild  restriction with a total lung capacity of 77% of predicted.  Her DLCO  was totally normal.   IMPRESSION:  Dyspnea on exertion of unknown etiology.  I suspect this is  primarily secondary to her obesity and her deconditioning.  There may  also be a component of intermittent volume overload.  There is no  evidence of obstructive disease on pulmonary function tests, and her  very minimal restriction is most consistent with her centripetal  obesity.  I do think with her history that we need to exclude the   possibility of thromboembolic disease; therefore, I think she should  undergo a CT scan of the chest with spiral imaging.  This will also  allow [place] to look at her lung fields and make sure there is no obvious  occult process.   PLAN:  1. Spiral CT of the chest, rule out PE and other parenchymal lung      disease.  2. Work on weight loss and deconditioning.  3. I will call the patient after the above and if negative, she needs      to work aggressively on an exercise program as well as losing      weight.  4. The patient also apparently had an echo while she was in the      hospital, and these results are not available to me.  I would make      sure they did not show evidence of pulmonary hypertension.     Barbaraann Share, MD,FCCP  Electronically Signed    [facility]/MedQ  DD: 06/18/2007  DT: 06/19/2007  Job #: [id number]   cc:   Lovenia Kim, D.O.

## 2010-12-07 NOTE — Letter (Signed)
November 12, 2008   Carol Kim, DO  [address]  [place], [place] [address]   Re:  Carol, [name]                  DOB:  1956/05/22   Dear Dr. Elisabeth Most:   I saw the patient today.  She is extremely interesting patient.  This 55-  year-old patient has had multiple back surgeries with the last one being  approximately 2-3 years ago.  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  She has no axillary, palmar, or  plantar hyperhidrosis.  She has been on multiple medications with little  change.  She says this inferiors with her social life. She also  apparently is being worked up for urinary tract infections after being  on oxybutynin.  While in the office, there was no evidence of any  sweating and palmar, plantar areas as well as the axillary areas were  dry.  She says that this comes on with exertion or anxiety.   PAST MEDICAL HISTORY:  She has no allergies.  Her medications include  Synthroid 0.125 mg for hypothyroidism, Phenergan, Xanax, Prilosec,  Astelin, Cymbalta, Percocet for back pain, and oxybutynin.  She had 4  back surgeries, hypothyroidism, and moderate obesity. She is very  sedentary in her lifestyle.   FAMILY HISTORY:  Noncontributory.   SOCIAL HISTORY:  She is married, 2 children.  She is disabled.  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  Does not smoke.   REVIEW OF SYSTEMS:  She weighs 235 pounds and she is 5 feet 7.  She has  had no weight loss.  Cardiac:  No angina or atrial fibrillation.  Pulmonary:  No hemoptysis, fever, chills, excessive sputum.  GI:  Constipation.  GU:  Question of dysuria, no kidney disease.  Vascular:  She gets pain in her leg with walking and had a history of phlebitis.  No TIAs.  Neurological:  No dizziness, headaches, blackouts, or  seizures.  Musculoskeletal:  No  joint pain and no muscular pain.  Psychiatric:  She is in depression.  Eyes/ENT:  No change in her  eyesight or hearing.  Hematological:  No problems with bleeding,  clotting disorders, or anemia.   PHYSICAL EXAMINATION:  VITAL SIGNS:  Her blood pressure is 138/89, pulse  79, respirations 18, and saturations were 98%.  HEAD, EYES, EARS, NOSE, and THROAT:  Unremarkable.  There is no evidence  of sweating.  CHEST:  Clear to auscultation and percussion.  HEART:  Regular sinus rhythm.  ABDOMEN:  Soft.  EXTREMITIES:  Pulses are 2+.  She has no evidence of plantar or axillary  sweating.   This is a difficult situation and dorsal sympathectomy is very effective  for palmar and plantar hyperhidrosis with about 90% cure rate.  However,  with plantar is only about 60%, but the one that do the worst are scalp  and facial sweating, which only has been at least 50% rate with  sympathectomy. Also because this is primarily controlled by the lower  stellate ganglion and the T2 ganglion, there is a much higher incidence  of Horner syndrome with sympathectomy.  Also given that this starting  late in life, I do not think that she would benefit from surgery and the  risk-benefit ratio is too high.  She might benefit with from some  stellate-ganglion blocks to see if that would help the hyperhidrosis and  if she gets a decent response, then maybe consider surgery at that time.  The other possibilities, she has been using Botox injections in this  area .  I have discussed this in detail, although we probably have to  see her at any time, but again I would need to consider surgery as a  last resort.   Ines Bloomer, M.D.  Electronically Signed   DPB/MEDQ  D:  11/12/2008  T:  11/13/2008  Job:  [id number]

## 2010-12-07 NOTE — H&P (Signed)
NAMEJERRYE, Carol [name]:  [id number]   MEDICAL RECORD NO.:  [id number]          PATIENT TYPE:  INP   LOCATION:  2023                         FACILITY:  [facility]   PHYSICIAN:  Vernice Jefferson, MD          DATE OF BIRTH:  [birth year]   DATE OF ADMISSION:  06/11/2007  DATE OF DISCHARGE:                              HISTORY & PHYSICAL   CARDIOLOGIST:  Armanda Magic with [facility]   CHIEF COMPLAINT:  Syncope.   HISTORY OF PRESENT ILLNESS:  Patient is a 55 year old white female with  history of prior syncopal episodes, no formal cardiac history, who comes  in with complaint of syncopal episode that occurred today while watching  TV.  Patient states that she had a prodrome of lightheadedness with some  visual changes prior to the episode and when she stood up, she passed  out and the next thing she knew she was on the floor.  There is no  seizure activity noted, there is no tongue biting, no bladder or bowel  incontinence.  Patient did have a postictal.  Patient additionally did  have some left-sided chest pain just prior to the episode and when she  woke up, the chest pain was still present.  Patient states that the  chest pain resolved after receiving nitroglycerin while here in the ED.  She had some associated symptoms just prior to syncopal episode  including shortness of breath and some diaphoresis.  Patient's last  syncopal episode was in 2006.  She has had a couple episodes of these  chest pains too that have not been associated with syncope over the last  several months.  She has had a negative stress test and negative prior  workup for etiology of her syncope.   PAST MEDICAL HISTORY:  1. Hypothyroidism.  2. GERD.  3. History of vitamin D deficiency.  4. Several back surgeries in the past.   MEDICATIONS:  Include:  1. Synthroid 0.25 mg every day.  2. Vitamin D.  3. Oxycodone p.r.n.   ALLERGIES:  TO CODEINE AND BETADINE.   SOCIAL HISTORY:  She lives in  [place] with husband.  Negative  smoking, negative alcohol, no drug use.   FAMILY HISTORY:  Noncontributory to patient's current medical condition.   REVIEW OF SYSTEMS:  Negative review of systems except otherwise dictated  in the above HPI.   PHYSICAL EXAMINATION:  VITAL SIGNS:  Her blood pressure is 139/88, heart  rate of 97, respirations of 18, T-max 97.7, satting 97% on room air.  GENERAL:  Well-developed, well-nourished white female in no acute  distress.  HEENT:  Moist mucous membranes.  No sclerae icterus, normal conjunctival  pattern.  NECK:  Supple with full range of motion.  No jugular venous distention.  CARDIOVASCULAR:  Regular rate and rhythm.  No rubs, murmurs or gallops.  CHEST:  Clear to auscultation bilaterally.  No wheezes, rales or  rhonchi.  ABDOMEN:  Soft, nontender, nondistended.  Normoactive bowel sounds.  EXTREMITIES:  No peripheral edema, pulses 2+ bilaterally.   Chest x-ray personally reviewed  by me demonstrates no acute infiltrates,  no acute processes.   EKG personally reviewed by me shows a normal sinus rhythm, normal ECG.   LABORATORY DATA:  Significant for a hemoglobin of 14.5, BUN and  creatinine of 13 and 0.9, D-dimer negative, and cardiac biomarkers  negative x2 sets.   IMPRESSION:  1. Syncope, unknown etiology.  Doubt with last echo with a normal      ejection fraction a cardiogenic source, but possibly a      neurocardiogenic source, but no evidence at this point that she has      a neurogenic source for her syncope.  Will watch her on telemetry      and further evaluate.  May consider tilt-table testing if      everything becomes nonrevealing.  Given that she has had several      isolated episodes, I think her risk of having sudden cardiac death      as etiology for syncope is pretty low.  May consider repeat      echocardiogram.  2. Chest pain, possible acute coronary syndrome versus atypical      musculoskeletal chest pain versus  pulmonary embolism.  Although her      D-dimer is negative, her clinical history does sound more      compatible with pulmonary embolism.  May consider a VQ scan or ACTA      which could evaluate for chronic thromboembolic disease, as she      does have some baseline shortness of breath.  Again, an echo would      help in terms of risk stratifying her in terms of her pulmonary      arterial pressures to see if this is the etiology although it has      been normal in the past.  We will follow biomarkers.  At this      point, with her negative two sets of enzymes and TIMI score of 1      will not put her on any heparin, continue aspirin and may consider      stress testing in the morning.  3. For her hypothyroidism, will continue her home medications and her      chronic pain will continue that as well.  Follow up with a.m. labs.      Vernice Jefferson, MD  Electronically Signed     JT/MEDQ  D:  06/11/2007  T:  06/11/2007  Job:  [id number]

## 2010-12-10 NOTE — Op Note (Signed)
NAME:  Carol [name], Carol [name]                            ACCOUNT NO.:  [id number]   MEDICAL RECORD NO.:  [id number]                   PATIENT TYPE:  INP   LOCATION:  5023                                 FACILITY:  [facility]   PHYSICIAN:  [name], M.D.              DATE OF BIRTH:  [birth year]   DATE OF PROCEDURE:  02/25/2002  DATE OF DISCHARGE:  02/28/2002                                 OPERATIVE REPORT   PREOPERATIVE DIAGNOSIS:  Degenerative disk disease L4-L5, foraminal  herniated nucleus pulposus L4-L5.   POSTOPERATIVE DIAGNOSIS:  Degenerative disk disease L4-L5, foraminal  herniated nucleus pulposus L4-L5.   OPERATION:  Posterolateral lumbar interbody fusion utilizing Allograft bone  graft, posterolateral lumbar fusion utilizing pedicle screw instrumentation,  local cancellous and AlloMatrix bone graft, facetectomy L4-L5 left.   SURGEON:  [name], M.D.   ANESTHESIA:  General.   BRIEF HISTORY/INDICATIONS:  A 55 year old with refractory left lower  extremity radicular pain L4 nerve root distribution secondary to lateral  disk herniation compressing the L4 nerve root.  The patient has been  experiencing mechanical back pain as well with positive diskography  discordant at L3-L4 and L5-S1.  Operative intervention is indicated for  diskectomy and fusion interbody utilizing pedicle screw instrumentation and  interbody device Brantigan versus Allograft bone.  Risks and benefits  discussed including bleeding, infection, damage to surrounding structures,  CSF leakage, epidural fibrosis, need for fusion in the future, etc., also  __________, PE, DVT.   TECHNIQUE:  The patient in supine position.  After induction of adequate  general anesthesia, 1 g of Kefzol and 500 mg of vancomycin, the patient was  placed prone on the Martha frame.  All bony prominences were well padded.  The lumbar region was prepped and draped in the usual sterile fashion.  Incision was made from the  spinous process of L3 through S1.  Subcutaneous  tissue was dissected.  Electrocautery was utilized to achieve hemostasis.  Placed a Kocher on the spinous process of L3 and L4 to identify them and  then finally on L4 and L5.  The paraspinous muscles were elevated from the  lamina of L4 and L5 and L3 bilaterally.  We placed a deep retractor.  We  exposed the facet and removed the capsule of the L4-L5 facet as well as  decorticated it. Removing the cartilage from within the facet.  Found the  transverse processes of L4 and L5, preserved the facet at L3-L4.  Also found  the pars at L4 as well, this was bilaterally.  Next we removed the  interspinous ligament, skeletonized the spinous processes at L4 and L5 and  then morselized them and saved them for later bone grafting.  It was felt to  be excellent cancellous bone graft.  We then performed a central laminectomy  with significant ligamentum flavum hypertrophy noted especially on the left  with lateral recess  stenosis noted.  Placed a hockey-stick probe in the  foramen of L4 and it was found to be stenotic probably secondary to this  lateral and far lateral disk herniation.  Bipolar electrocautery was  utilized to assure hemostasis.  There was extensive epidural venous plexus  noted laterally.  We had to remove the inferior process of the facet of L4  on the left and partial removal of the superior portion of the facet at L5  to the pedicle.  We went through the pars, decompressed some ligamentum and  bone into the foramen.  We identified the L4 nerve root, gently mobilized it  medially.  It was found to be compressed and pushed superiorly due to this  disk herniation.  Found a far lateral disk herniation and annulotomy was  performed and the disk material was removed  decompressing.  Bipolar  electrocautery was utilized to achieve hemostasis.  We made a fairly large  laminotomy and with the thecal sac gently retracted medially we thoroughly   curetted the disk space at L4-L5 and the endplates utilizing a series of  curets decorticating the endplates, removing the disk material.  A thorough  diskectomy was performed.  Also we used a disk scraper as well.  A thorough  diskectomy was performed.   We first then placed the pedicle screws on the contralateral side after  identifying the intersection between the transverse process and the superior  articulating facet of the joint.  This was first found with an awl and then  subsequently with a pedicle finder, converging in the appropriate angulation  as predetermined preoperatively.  Found the pedicles at L4 and L5, ball tip  probe placed, and found to be fully contained.  This was confirmed with x-  ray.  We then tapped above with a 5.5 tap and then inserted 6.5, 40-mm screw  after depth gauge was determined.  Then we distracted off these pedicle  screws after burring the TP at L4 and L5 in the pars. Then from the left  symptomatic side after full diskectomy, the cancellous bone impacted into  the disk space as much as we felt was appropriate.  Then we took an  Allograft bone which was felt to be appropriate because with the Brantigan  cage we did not feel we could get the full amount of convergence due to the  narrow medial lateral distance of her spinal canal.  This Allograft bone was  25 mm in length and 12 in height. This was kidney shaped and we advanced it  across the midline utilizing a series of inserters.  Excellent purchase was  visualized on x-ray throughout all time.  We then placed cancellous bone  around that and packed it.  This was found to be satisfactory on the x-ray.  Hockey-stick probe placed in the foramen bilaterally and found to be widely  patent.  The nerve root was protected at all times.  Bipolar electrocautery  was utilized to achieve hemostasis and thoroughly irrigated.  In a similar fashion we placed pedicle screws on the left side after finding the   pedicles, advancing them using a ball tip probe and placing 40 mm length.  After tapping and insertion of the screws, excellent purchase was noted.  We  burred the pars in the transverse processes.  We inserted the screws with  excellent purchase 40 mm in length.  We placed a rod, short curved, on both  sides.  Compressed down on the graft and then placed  set screws and torqued  them to 80 foot pounds bilaterally.  Hockey-stick probe placed in the  foramen bilaterally and found to be widely patent.  Placed cancellous bone  graft from the spinous processes in the facet and moved laterally into the  lateral recesses.  This was excellent bone quality.  We also placed  AlloMatrix bone graft out in the lateral recesses as well.   Wound copiously irrigated once again.  Electrocautery utilized to achieve  hemostasis.  Thrombin-soaked Gelfoam was placed in the laminotomy defect  after inspection revealed no evidence of CSF leakage or active bleeding.  Next placed a Hemovac drain brought out through a lateral stab wound in the  skin.  The dorsolumbar fascia was then reapproximated with #1 Vicryl in a  figure-of-eight suture watertight closure.  The subcutaneous tissue  reapproximated with #2-0 Vicryl simple suture.  The skin was reapproximated  with staples.  The wound was dressed sterilely.  The patient was placed  supine, extubated without difficulty and transported to the recovery room in  satisfactory condition.   We did get a final x-ray in the AP and lateral plane with appropriate  convergence.  We also felt with a pedicle finder and the hockey-stick probe  bilaterally and it felt to be contained within the pedicles.   Approximate blood loss was 400 cc.                                               [name], M.D.    JCB/MEDQ  D:  02/25/2002  T:  02/28/2002  Job:  [id number]

## 2010-12-10 NOTE — Op Note (Signed)
NAME:  Carol [name], Carol [name]                            ACCOUNT NO.:  [id number]   MEDICAL RECORD NO.:  [id number]                   PATIENT TYPE:  INP   LOCATION:  5001                                 FACILITY:  [facility]   PHYSICIAN:  Jene Every, M.D.                 DATE OF BIRTH:  [birth year]   DATE OF PROCEDURE:  08/06/2003  DATE OF DISCHARGE:                                 OPERATIVE REPORT   PREOPERATIVE DIAGNOSES:  Degenerative disk disease, recurrent disk  herniation L3-4 status post fusion L4-5.   POSTOPERATIVE DIAGNOSES:  Degenerative disk disease, recurrent disk  herniation L3-4 status post fusion L4-5.   PROCEDURE:  1. Removal of hardware pedicle screws of L4 and L5 and rods at L4-5.  2. Re-exploration of fusion L4-5.  3. Augmentation of fusion in the lateral mass at L4-5 utilizing autologous     and symphony bone graft.  4. Redo decompression at L3-4 with TLIF at L3-4 utilizing a Depuy interbody     cage with allograft and autograft followed by pedicle screw     reinstrumentation at L3 bilaterally, L4 bilaterally, L5 bilaterally     followed by lateral mass fusion at L3-4 utilizing autologous and     allograft bone graft.  5. Intraoperative neural monitoring four hours utilizing free running EMG     sensory evoked potentials.  6. Intraoperative C-arm interpretation with the pedicle screw and interbody     spacer.   ANESTHESIA:  General.   ASSISTANT:  1. Roma Schanz, P.A. for exploration of fusion, removal of hardware.  2. Kerrin Champagne, M.D. for augmentation of fusion and the remaining     procedures.   BRIEF HISTORY:  This is a 55 year old status post fusion at 4-5 who  developed stenosis, recurrent disk herniation at 3-4 and degenerative disk.  Operative intervention was indicated for redo decompression at 3-4 followed  by fusion interbody and lateral mass pedicle screw instrumentation, removal  of the previous hardware at 4-5, exploration of fusion, possible  augmentation and instrumentation from 4 to 3.  Risks and benefits discussed  including bleeding, infection, injury to neurovascular structures, CSF  leakage, epidural fibrosis, adjacent segment disease, nonunion, etc.   TECHNIQUE:  The patient in the supine position after an adequate level of  general anesthesia and 500 mg of vancomycin, she was placed prone on the  Wilson frame.  All bony prominences were well padded. The lumbar region was  prepped and draped in the usual sterile fashion.  The patient had a latex  allergy and a Betadine allergy and we used none of those products. The  previous surgical incision was utilized.  We excised the previous surgical  scar. The subcutaneous tissue was dissected, electrocautery was utilized to  achieve hemostasis.  We used cell saver.   We identified the remaining spinous process of 3 cephalad and the spinous  process of S1  posteriorly, divided the scar tissue in the midline, utilizing  the Cobb elevated it laterally identifying the pedicle screws and rods at 4-  5.  We placed a retractor utilizing the curette to delineate and expose the  rods, pedicle screws, the lamina of 3, facet of 4 and the transverse  processes of 3.  The previous rods were removed as was the screws.  We  curetted the scar tissue from there and the bone in the lateral recess.  There was no motion noted at 4-5.  We used a high speed bur to decorticate  the bone in the lateral recess between the TPs at 4-5 bilaterally.  The pars  and the TPs at 3.  We replaced the screws at 4 and 5 with 7-0 screws with  excellent purchase. The screws removed were not loose. They were replaced  with the identical length. At 3, we utilized an awl at the intersection of  the transverse process and superior articularly in the facet and at the  appropriate convergence, pedicle finders utilized to find the pedicle,  measured the depth at 40, tapped 5, 5 and this was done bilaterally.  We  inserted  the screws with excellent purchase. We tested the pedicle screws  with the neural monitors, each showing satisfactory milliamps.  Next we  decompressed at 3-4 on the right, removed the 3-4 facet utilizing osteotome,  2 and 3 mm Kerrison.  We then applied the pedicles at 3 and 4.  The thecal  sac was mobilized, we decompressed the lateral recess at 3-4.  The thecal  sac and neural elements were well protected. The disk space was identified.  We placed a contoured rod from the pedicle screws of 3 to 4, placed our caps  over them.  Annulotomy was performed to distract the disk space with a 7 and  8 mm distraction and it found to be fairly tight.  Felt the 7 cage would be  the appropriate width.  We then with the neural elements well protected  prepared the interbody space and curetted thoroughly the endplates removing  all cartilage and all disk from the disk space.  I then used the symphony  bone graft and packed it anteriorly.  We also packed the autogenous and the  symphony bone graft in the carbon fiber cage and then under x-ray  distracting out the pedicle screws inserted the 7 mm cage across the midline  with excellent purchase released the traction.  In the AP and lateral  planes, the pedicle screws were found to be satisfactory in converging and  the cage was found to be in the midline in the AP and lateral plane.   Next the wound was copiously irrigated once again.  A hockey stick probe  placed in the foramen of 3 and 4;  3, 4 and 2 found to be widely patent with  no evidence of CSF leakage or active bleeding.  Next we compressed at 3-4  across the interbody spacer and torqued the screws on the [facility] system  with 60 foot pounds.  This was done symmetrically.  We placed a hockey stick  probe in the foramen found to be widely patent after the compression.  We  then torqued the facet screw at 5 on the pedicle screws at 5.  We then took the remainder of the symphony bone graft and  augmented the bone in the  lateral recesses.  Bipolar electrocautery was utilized to achieve  hemostasis.  We then  copiously irrigated the wound.  Thrombin soaked Gelfoam  was placed in the laminotomy defect.  We performed an annulotomy and the 3-4  disk was found to be ruptured into the foramen.  This was the pathology as  was the lateral recess.  We utilized the symphony platelet rich concentrate  with hemostasis plus a Hemovac brought out through a lateral stab wound in  the skin.  We prepared the dorsolumbar fascia with #1 Vicryl interrupted  figure-of-eight sutures with water tight closure. The subcutaneous tissue  was reapproximated with 2-0 Vicryl simple sutures, skin was reapproximated  with staples.  We made sure prior to the closure that the length of rods  above and below the screws were appropriate.   Sterile dressing applied, she was placed supine on the hospital bed,  extubated without difficulty and transported to the recovery room in  satisfactory condition.   The patient tolerated the procedure well with no complications. Blood loss  600 mL.  We used retransfused 200 mL blood with autotransfusion.                                               Jene Every, M.D.    Cordelia Pen  D:  08/06/2003  T:  08/07/2003  Job:  [id number]

## 2010-12-10 NOTE — Discharge Summary (Signed)
NAME:  Carol [name], Carol [name]                            ACCOUNT NO.:  [id number]   MEDICAL RECORD NO.:  [id number]                   PATIENT TYPE:  INP   LOCATION:  5023                                 FACILITY:  [facility]   PHYSICIAN:  [name], M.D.              DATE OF BIRTH:  07/01/1956   DATE OF ADMISSION:  02/25/2002  DATE OF DISCHARGE:  02/28/2002                                 DISCHARGE SUMMARY   ADMISSION DIAGNOSES:  1. Herniated nucleus pulposis at L4-5 with left-sided stenosis.  2. Hypoglycemic episodes.  3. Chronic constipation.   DISCHARGE DIAGNOSES:  1. Status post posterior lumbar interbody fusion at L4-5 with pedicle screws     and lateral mass fusion.  2. Postoperative hemorrhagic anemia, which was mild and asymptomatic and did     not require blood transfusion.  3. History of hypoglycemic episodes.  4. Chronic constipation.   PROCEDURES:  Transverse lumbar interbody fusion/posterior lumbar interbody  fusion of L4-5, pedicle screws at L4-5, and lateral mass fusion at L4-5 with  local allograft bone graft.  This was done by [name], M.D., with  the assistance of Kerrin Champagne, M.D.  Anesthesia used was general.   CONSULTS:  None.   HISTORY OF PRESENT ILLNESS:  The patient is a 55 year old white female who  has been seen by [name], M.D., for continuing progressive problems  concerning her lumbar spine with radiation to the left lower extremity.  This pain was reproducible by discogram.  The patient had been treated by  injections in the lumbar spine.  She has also been treated in the [facility].  Unfortunately the pain continued despite Duragesic patches.  She has positive straight leg raise on the left, which was exacerbated by  left ankle dorsiflexion.  Fortunately she developed no sensory loss or  muscle weakness.  MRI showed a herniated disk at L4-5 with left-sided  stenosis.  The patient had continuing progressive low back  pain which was  not relieved even with strong analgesics and Duragesic patch.  It was  thought that her best course at this point would be operative intervention.  The risks and benefits of the proposed surgery were discussed with the  patient by [name], M.D.  She indicated an understanding and opted  to proceed.   LABORATORY DATA:  On February 22, 2002, the patient's CBC was within normal  limits with the exception of an RDW of 15.0.  The hemoglobin and hematocrit  were monitored postoperatively for three days in the hospital and reached a  level of 10.9 and 33.8, respectively, on February 28, 2002, however, she was  asymptomatic and did not require any blood transfusions.  The basic  metabolic panel preoperatively was within normal limits.  It was monitored  on postoperative day #1.  The glucose was slightly elevated at 150, BUN  slightly down at 4, and otherwise normal.  Blood typing on February 22, 2002,  showed blood type to be O, Rh positive, and antibody screen negative.   X-RAY REPORTS:  On February 25, 2002, the intraoperative C-arm showed posterior  fusion at L4-5.  The EKG from February 22, 2002, showed sinus bradycardia, low-  voltage QRS, cannot rule out an anterior infarct, age undetermined.  Additional finding was a nonspecific abnormality which was new, read by  Armanda Magic, M.D.   HOSPITAL COURSE:  On February 25, 2002, the patient was taken to the operating  room for the above-listed procedures.  She tolerated it very well without  any intraoperative complications.  The estimated blood loss was  approximately 400 cc.  She was transferred to the recovery room in stable  condition.   The patient was started on the appropriate antibiotic course.  She also had  a combination of p.o., as well as IV analgesics utilized for pain control.  She was weaned over to p.o. analgesics quickly on postoperative days #1 and  #2, did very well with this, and pain control remained  adequate.   Neurovascularly intact upon admission to the hospital and continued until  discharge and remained intact without any complications.  Bedside incentive  spirometry was utilized to decrease the chance of any pulmonary  complications.  Physical therapy and occupational therapy were consulted to  assist the patient with transfers and getting out of bed, as well as  ambulating in her brace.  She did well with therapy.   Her diet was slowly advanced to a regular diet.  By the time of discharge,  she was eating a regular diet, had passed flatus, and also had had a bowel  movement following Dulcolax suppository.   The operative dressing was taken down on postoperative day #2.  The incision  looked excellent without any signs or symptoms of infection.  No significant  drainage and no purulence was present.  Daily dressing changes were done  until discharge.  The incision continued to look good without any infected  signs.   Discharge planning was consulted to assist [place] with arranging any home needs  and equipment for the patient.  This was done prior to discharge.   By February 28, 2002, the patient had met all orthopedic goals, was medically  stable, and was ready for discharge.  Her vital signs were stable.  She was  afebrile.  She was neurovascularly intact.  The numbness and tingling that  she had had preoperatively had resolved.  Her motor and sensation of  bilateral lower extremities was intact and unchanged throughout the hospital  course.   ACTIVITY:  She is to maintain brace precautions at all times.  This was  discussed with the patient at length.  Brace on when she is up out of bed.  She may put it on sitting on the edge of the bed.  She can ambulate as  tolerated progressively.   WOUND CARE:  Dressing changes will be done on a daily basis.   FOLLOW UP:  She is to follow up in 10-14 days postoperatively with [name], M.D.  Instructed to call for an  appointment.  DISCHARGE MEDICATIONS:  Percocet and Robaxin.  Prescriptions were written  for both of these.  She was instructed to use Tylenol as needed for any  temperature, over the count laxatives as needed for constipation, and  suppositories, which she does have at home, as needed.  DIET:  As tolerated.   CONDITION ON DISCHARGE:  Stable and improved.    DISPOSITION:  The patient is being discharged to her home.  Instructed to  call should she have any problems or questions.     Verlin Fester, P.A.                       [name], M.D.    CM/MEDQ  D:  04/03/2002  T:  04/05/2002  Job:  [id number]
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
## 2010-12-10 NOTE — Op Note (Signed)
NAME:  Carol [name], Carol [name]                            ACCOUNT NO.:  [id number]   MEDICAL RECORD NO.:  [id number]                   PATIENT TYPE:  AMB   LOCATION:  DAY                                  [facility]ACILITY[facility]  [facility]   PHYSICIAN:  Jene Every, M.D.                 DATE OF BIRTH:  1955-10-10   DATE OF PROCEDURE:  03/12/2003  DATE OF DISCHARGE:                                 OPERATIVE REPORT   PREOPERATIVE DIAGNOSES:  1. Herniated nucleus pulposus L3-4, right.  2. Spinal stenosis L3-4, right.   POSTOPERATIVE DIAGNOSES:  1. Herniated nucleus pulposus L3-4, right.  2. Spinal stenosis L3-4, right.   OPERATION/PROCEDURE:  1. Lateral recess decompression.  2. Foraminotomies, L3 and L4.  3. Microdiskectomy L3-4, right.   SURGEON:  Jene Every, M.D.   ANESTHESIA:  General.   ASSISTANT:  Roma Schanz, P.A.-C.   BRIEF HISTORY AND INDICATIONS:  This is a 55 year old, status post fusion of  L4-5, adjacent segment disease and L3-4 disk herniation.  Seen on the MRI  was some lateral recess stenosis of the foramen.  Operative intervention  indicated for decompression.  The risks and benefits were discussed  including bleeding, infection, anesthetic risks, CSF leakage, epidural  fibrosis, adjacent segment disease and need for fusion.  We discussed  adjacent segment fusion.  May require that in the future but preliminarily  without significant back pain, we felt that a decompression was warranted.   DESCRIPTION OF PROCEDURE:  With the patient in the supine position and after  induction of adequate general anesthesia and 1 g of Kefzol, she was placed  prone on the [place] frame.  All bony prominences were well padded.  The  lumbar region was prepped and draped in the usual sterile fashion.  A mid  portion of the previous surgical incision was excised and extended  approximately 4 cm.  Subcutaneous tissue was dissected. Electrocautery was  utilized to achieve hemostasis.  We identified  the spinous processes of L4  and L2, placed a Kocher retractor upon them.  Confirmed it with x-ray.  Cobra retractor was placed and the operating microscope draped and brought  onto the surgical field.  I delineated the lamina of L3.  Obtained another x-  ray with Penfield 4 in the laminar space and on the spinous processes of L3.  Curets detached the ligamentum flavum from the caudad edge of L3,  hemilaminotomy was performed with 2 mm and 3 mm Kerrisons to the attachment  of ligamentum flavum which was removed from the interspace.  There was  hypertrophic ligamentum vaginating at the lateral recess.  Lateral recess  stenosis was noted.  Facet was undercut with a 2 mm and 3 mm Kerrisons  observing neural elements at all times.  Bone wax was utilized for  hemostasis as well.  Lateral recess was noted.  Gently mobilized the nerve  root medially and  a focal foraminal disk herniation was appreciated.  Hemilaminotomy was performed and a copious amount of disk material was  removed from subannular space, performing a diskectomy.  The nerve root was  visualized and protected throughout the case.  The extruded fragment was  mobilized from the foramen and removed with pituitary rongeur.  Disk  material was removed from the interspace as well medially with minimal  traction on the thecal sac.  Bipolar electrocautery was utilized to achieve  hemostasis in the extensive epidural venous plexus.  Foraminotomy at L3 was  performed, then L4.  Hockey=stick probe placed in the foramina of L4 and  particularly of L3, found to be widely patent following decompressing.  Nerve roots were erythematous and edematous at L3 but it was intact.  No CSF  leakage or active bleeding.  Copiously irrigated the disk space with  antibiotic irrigation.  Thrombin-soaked Gelfoam was placed in the lateral  recess.  Removed the multicolored retractor.  Paraspinous muscles inspected  and no sign of active bleeding.  __________  drain brought up through a stab  wound of the skin.  Dorsal lumbar fascia was reapproximated with #1 Vicryl  interrupted figure-of-eight sutures, subcutaneous tissue reapproximated with  2-0 Vicryl interrupted sutures.  Skin reapproximated with staples. The wound  was dressed sterilely.  The patient was placed supine on the hospital bed,  extubated without difficulty and transported to the recovery room in stable  condition.  The patient tolerated the procedure well.  There were no  complications.                                               Jene Every, M.D.    Carol [name]  D:  03/12/2003  T:  03/12/2003  Job:  [id number]

## 2010-12-10 NOTE — Op Note (Signed)
Carol [name], [place] NO.:  [id number]   MEDICAL RECORD NO.:  [id number]          PATIENT TYPE:  INP   LOCATION:  2899                         FACILITY:  [facility]   PHYSICIAN:  Jene Every, M.D.    DATE OF BIRTH:  [birth year]   DATE OF PROCEDURE:  10/25/2004  DATE OF DISCHARGE:                                 OPERATIVE REPORT   PREOPERATIVE DIAGNOSES:  1.  Spinal stenosis.  2.  Painful retained hardware.  3.  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   POSTOPERATIVE DIAGNOSES:  1.  Spinal stenosis at L3-4 and L2-3.  2.  Painful retained hardware.   PROCEDURES PERFORMED:  1.  Reexploration of fusion at L3-4 and L4-5.  2.  Removal of hardware, including the left L3 pedicle screw and partial      removal of the left spinal rod.  3.  Lumbar decompression of L2-3 and redo decompression at L3-4.  4.  Augmentation of fusion of L3-4, left.  5.  Autograft and Grafton bone graft.  6.  Intraoperative neurologic monitoring for three hours utilizing free-      running EMG sensory evoked potentials.   ANESTHESIA:  General.   SURGEON:  Jene Every, M.D.   ASSISTANTS:  1.  Kerrin Champagne, M.D.  2.  Roma Schanz, P.A.   BRIEF HISTORY AND INDICATIONS:  This is a 55 year old who is status post  extension of her fusion to L3-4 and developed postoperatively, approximately  three months after fall, severe left lower extremity radicular pain.  She  had been refractory to conservative treatment, requiring fentanyl patches.  She had temporary relief from an epidural sternoid injection at 3 on the  left.  Her MRI indicated possible lateral recess stenosis at 3-4.  After she  was refractory to conservative treatment, we indicated exploration of her  fusion and the lateral recess at 3-4 and evaluation of 3 pedicle was  appropriate to evaluate the pathology.  We discussed possibilities,  including decompression, fusion, removal of all hardware, removal of partial  hardware, removal of her  T-lift __________  bone graft depending upon the  intraoperative findings.  She had minimal to no back pain and a normal nerve  conduction study.  The risks and benefits were discussed, including  bleeding, infection, damage to vascular structures, pseudoarthritis, need  for adjacent segment fusion, worsening of symptoms, no change in symptoms,  etc.   TECHNIQUE:  With the patient in the supine position after adequate general  anesthesia and 1 g of Kefzol, she was placed prone on a Wilson frame in the  flexed position.  All bony prominences were well padded, the axilla and the  elbow as well.  After prepping and draping with Hibiclens and alcohol, I  removed the previous surgical scar.  The incision was incised.  Electrocautery utilized to achieve hemostasis.  I divided the subcutaneous  tissue down to the dorsolumbar fascia and divided that in line with the skin  incision and palpated the residual spinous process of 3.  I entered the  spinous process of S1.  I  was able to find the lamina of 3 on the left.  The  paraspinous muscles were elevated from that, taking it out laterally to  identify the rod and the pedicle screw at 3.  I therefore identified the  pedicle screw and the rod on the left at 3, 4 and 5.  I carried dissection  out just to the medial border of the rod at 3 on the right.  I then loosened  the facet screws on the left and slid the rid caudad.  Following this, I  decorticated the residual lamina of 3 and the spinous process.  I performed  hemilaminotomy here and it seemed like she had lateral recess stenosis here.  I performed a hemilaminectomy at 3 and at 2-3.  I then removed the pedicle  screw on the left at 3.  There was some question as to whether it was  medially directed or had broached the medial cortex.  We then palpated the  pedicle screw canal.  There was no evidence of any broaching of the medial  cortex.  We evaluated the pedicle of 3.  There was some recess  stenosis and  ligamentum flavum hypertrophy at 2-3 and at 3-4.  This was removed with a 2  and a 3 mm Kerrison.  It was severely stenotic in the lateral recess.  I  carried out to the pedicle of the foramen of 3 and this was decompressed as  well and to the pedicle of 4.  The T-lift had been performed at this level.  I placed a hockey stick in the lateral recess.  There was no compromise of  the medial, superior or inferior cortex of the L3 pedicle.  We were able to  probe down to the disk space.  I found no projection here from the cage.  The posterior point of the cage was just subannular.  I initially thought  that might have been the offending agent, but that was found to be identical  to that which was seen preoperatively and there had been no change.  She had  no pain postoperatively.  Following this, we copiously irrigated.  Bone wax  was placed on the cancellus surface.  We also did a pedicle probe testing  within the pedicle canal at 3 and it was found to be 12 and 60 MA.  Further  evidence of this was not broached.  Next, we explored the fusion.  There  appeared to be a paucity of bone in the lateral mass at 3-4.  I felt it was  solid and not moving at 3-4.  We left the construct on the right therefore  and then used autologous bone graft from the spinous process that we removed  at 3 and partial of 2 and placed down the lateral recess of 3-4, also with  the Grafton bone graft at the pedicle opening.  We had removed the rod, cut  it and then reinserted it and reset the screws of 4-5 on the left.  No  motion was noted at 3-4.  It was felt that due to a fair amount of fibrosis  just distal to the 3 root and along the lateral recess that further  exploration into the area of the T-lift and exploring down there would place  her at a high risk for a dural rent.  We felt that the stenosis noted at the lateral recess at 2-3 was significant to the point that it explained her  left-sided  leg pain.  Would copiously irrigated.  Then we took AP and  lateral plane x-rays and examined the lateral recesses.  There were no  retained sponges.  The sponge count was correct.  I placed a Hemovac and  brought it out through a lateral stab wound in the skin and repaired the  dorsal lumbar fascia with 1-0 Vicryl in interrupted figure-of-eight sutures.  The subcutaneous tissues were reapproximated with 2-0 Vicryl simple sutures.  The skin was reapproximated with staples.  The wound was dressed sterilely.  She was placed supine in the hospital bed, extubated without difficulty and  transported to recovery in satisfactory condition.   The patient tolerated the procedure well.  No complications.  Blood loss 300  mL.  We used the Cell Saver.      JB/MEDQ  D:  10/25/2004  T:  10/25/2004  Job:  [id number]

## 2010-12-10 NOTE — H&P (Signed)
Carol [name], [place] NO.:  [id number]   MEDICAL RECORD NO.:  [id number]          PATIENT TYPE:  INP   LOCATION:  1825                         FACILITY:  [facility]   PHYSICIAN:  Santiago Bumpers. Hensel, M.D.DATE OF BIRTH:  1956/01/19   DATE OF ADMISSION:  03/08/2005  DATE OF DISCHARGE:                                HISTORY & PHYSICAL   PRIMARY CARE PHYSICIAN:  Dr. Elisabeth Most at [facility].   CHIEF COMPLAINT:  Syncope.   HISTORY OF PRESENT ILLNESS:  The patient is a 55 year old female who comes  in with fatigue and headache for one month. The patient has a headache with  scatoma and bilateral throbbing that is new in the past month. The headaches  will come on at any time of the day. The patient also has fatigue to which  she will fall asleep 32 times in one hour which has progressed over the past  month. She recently had a sleep study. She was not officially told the  results for it but she was called by someone and told that they would like  to set up some sort of night time sleep machine for her. The patient also  complains of menopause sweats x1 month. She had vomiting four days ago which  resulted in exacerbation of dry heaving but this has resolved. She still has  some nausea associated with the headaches. The patient also reports having  bright red blood per rectum yesterday by Dr. Elisabeth Most diagnosed her with  hemorrhoids.   The patient had an episode of syncope today while in the shower. She  starting feeling hot, sweaty, and with chills. She knew she had to get out  of the shower or else she would faint on her way out. She apparently passed  out and her son found her half way out of the shower a little while later  and then brought her to the hospital.   PAST MEDICAL HISTORY:  1.  Hypothyroidism.  2.  Arthritis.  3.  Constipation secondary to Fentanyl.  4.  Back pain secondary to ruptured disc, had surgery four times.  5.  Obstructive  sleep apnea/hypopnea.  6.  GERD.  7.  Hysterectomy in 1996.  8.  Cholecystectomy.   MEDICATIONS:  1.  Fentanyl patch 50 mcg every three days.  2.  Fiberall for constipation.  3.  Synthroid 100 mcg p.o. daily.  4.  Nexium 20 mg p.o. daily.   ALLERGIES:  The patient has nausea when she takes CODEINE.   SOCIAL HISTORY:  The patient does not smoke, drink, or do drugs. The patient  quit work about two years ago due to back pain. 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 The patient lives with her husband, son, and daughter. She feels  safe at home.   CODE STATUS:  The patient is a full code.   FAMILY HISTORY:  Dad died of a MI at age 81. Grandma died of diabetes. Mom  has COPD.   PHYSICAL EXAMINATION:  VITAL SIGNS:  Temperature 97.4, respirations 16,  pulse 80,  blood pressure 125/89, 99% on room air.  HEENT:  TMs clear. Oropharynx clear. Dry mucus membranes.  NECK:  No lymphadenopathy. No nuchal rigidity.  CARDIOVASCULAR:  Regular rate and rhythm. No murmurs. A 2/6 pedal and radial  pulses. No JVD.  LUNGS:  Clear to auscultation bilaterally.  ABDOMEN:  Soft. Tender in the midepigastrium, positive bowel sounds.  EXTREMITIES:  No clubbing, cyanosis, or edema.  NEUROLOGICAL:  Cranial nerves II through XII intact. Cerebellar intact. Grip  strength 5/5.   LABORATORY DATA:  White blood cells 9.9, H&H 14.7/44, platelets 299,000. PMN  67%. Sodium 137, potassium 4.2, chloride 104, bicarbonate 23, BUN 15,  creatinine 0.8, glucose 85, protein 8, albumin 3.8, AST 20, ALT 14, alkaline  phosphate 82, bilirubin 0.8. Point of care cardiac enzymes negative x1. Head  CT with no acute bleed. EKG showed normal sinus rhythm, normal axis, no ST  changes.   ASSESSMENT:  A 55 year old female with a syncopal episode.   PLAN:  1.  Syncope:  The differential diagnosis is arrhythmias, valvular problems,      pulmonary embolus, intracranial bleed, myocardial infarction.  2.  To rule out arrhythmias, we will  continue to monitor the patient on      telemetry; her first EKG showed no irregularities and we will repeat one      in the a.m.  3.  To rule out valvular problems, we will get a 2-D echocardiogram in the      a.m. which should also indicate if she had a decreased ejection      fraction.  4.  To rule out a MI, we will get cardiac enzymes x2 q.8h. Her point of care      markers were already negative and an EKG showed no change from previous.  5.  To rule out a PE, we will get d-dimer and a chest x-ray if the d-dimer      is positive. The head CT was negative for an intracranial bleed. The      patient continues to be dehydrated. We will give her one liter of bolus      for rehydration and get orthostatic each shift on her.  6.  Back pain:  The patient will continue on her Fentanyl patch.  7.  Constipation:  Senokot S b.i.d.  8.  Hypothyroidism:  The patient will continue on home dose of Synthroid.  9.  GERD:  The patient will continue on home dose of Nexium.  10. DVT prophylaxis:  The patient will be put on STD's.      Rolm Gala, M.D.    ______________________________  Santiago Bumpers. Leveda Anna, M.D.    Bennetta Laos  D:  03/08/2005  T:  03/08/2005  Job:  [id number]

## 2010-12-10 NOTE — H&P (Signed)
NAME:  Carol [name], Carol [name]                            ACCOUNT NO.:  [id number]   MEDICAL RECORD NO.:  [id number]                   PATIENT TYPE:  INP   LOCATION:  NA                                   FACILITY:  [facility]   PHYSICIAN:  Jene Every, M.D.                 DATE OF BIRTH:  [birth year]   DATE OF ADMISSION:  08/06/2003  DATE OF DISCHARGE:                                HISTORY & PHYSICAL   ADMISSION DIAGNOSIS:  Low back with left lower extremity pain.   HISTORY OF PRESENT ILLNESS:  The patient is a 55 year old female who is a  longstanding patient of Dr. Shelle Iron.  She has had multiple lumbar surgeries  including a fusion and decompression, most recently done in August of 2004.  The patient was doing well until October of 2004 when she was attacked by a  neighbors dog.  At that time, the patient fell and noted increased pain in  her back with immediate onset of left lower extremity radiculopathy.  The  patient was treated conservatively, however, symptoms continued to progress.  MRI was obtained which showed bulging disk material on the right contacting  the right L3 nerve root, but no evidence of neural compression lesion on the  left.  The patient was initially sent for an ESI at the L3-4 level on the  left, however, after continued treatments, the patient reported her pain as  being incapacitating.  CT myelogram was ordered which does show a  compressive lesion.  It is felt at this point, the patient would benefit  from an extension of her fusion with removal of hardware at the L4-5 level  with extension of the fusion to L3-4 with pedicle screw instrumentation,  possible TLIF, and decompression at the L3-4 level with lateral mass fusion.  The risks and benefits of the surgery were discussed with the patient and  she wishes to proceed.   PAST MEDICAL HISTORY:  Significant for hypoglycemia.   CURRENT MEDICATIONS:  Vicodin p.r.n. pain.   ALLERGIES:  BETADINE which causes a rash.   PAST SURGICAL HISTORY:  Hysterectomy, left foot, fusion at the L4-5 level  and decompression.   SOCIAL HISTORY:  The patient is married.  She denies any tobacco or alcohol  intake.  Family will be caregivers following surgery.   FAMILY HISTORY:  Father deceased at age 22 of massive MI.   REVIEW OF SYSTEMS:  GENERAL:  The patient denies any fever, chills, night  sweats, or bleeding tendencies.  CNS:  No blurry, double vision, seizure,  headache, or paralysis.  RESPIRATORY:  No shortness of breath, productive  cough, or hemoptysis.  CARDIOVASCULAR:  No chest pain, angina, or orthopnea.  GENITOURINARY:  No dysuria, hematuria, or discharge.  GASTROINTESTINAL:  No  nausea, vomiting, diarrhea, constipation, melena, bloody stools.  MUSCULOSKELETAL:  Not pertinent.   PHYSICAL EXAMINATION:  VITAL SIGNS:  Pulse 76, respiratory  rate 16, blood  pressure 118/80.  GENERAL:  This is a well-developed, well-nourished, 55 year old female in  mild distress.  HEENT:  Atraumatic, normocephalic.  Pupils equal, round, and reactive to  light.  EOM's intact.  NECK:  Supple with no lymphadenopathy.  CHEST:  Clear to auscultation bilaterally.  No rhonchi, wheezes, or rales.  BREASTS:  GENITOURINARY:  Not examined and not pertinent.  HEART:  Regular rate and rhythm without murmurs, rubs, or gallops.  ABDOMEN:  Soft and nontender and nondistended.  Bowel sounds x4.  SKIN:  No rashes or lesions are noted.  EXTREMITIES:  The patient does have pain with forward flexion.  Positive  straight leg raise bilaterally.  She does have decreased quad strength on  the right.  She does have slightly altered sensation at L3 nerve root  distribution.   IMPRESSION:  HNP with degenerative disk disease at L3-4.   PLAN:  The patient will be admitted to [facility] and undergo the  above stated procedure.      Roma Schanz, P.A.                   Jene Every, M.D.    CS/MEDQ  D:  08/04/2003  T:  08/04/2003   Job:  [id number]

## 2010-12-10 NOTE — Op Note (Signed)
[facility]  Patient:    Carol [name], Carol [name]                         MRN: [id number] Proc. Date: 02/08/01 Adm. Date:  [id number] Attending:  Leonides Grills A                           Operative Report  PREOPERATIVE DIAGNOSIS:  Left second tarsometatarsal through joint, loose frank fracture dislocation.  POSTOPERATIVE DIAGNOSIS:  Left second tarsometatarsal through joint, loose frank fracture dislocation.  OPERATION:  Open reduction internal fixation, left second tarsometatarsal joint fracture dislocation.  ANESTHESIA:  General with endotracheal tube.  SURGEON:  Sherri Rad, M.D.  ASSISTANT:  Druscilla Brownie. Underwood III, P.A.  ESTIMATED BLOOD LOSS:  Minimal.  TOURNIQUET TIME:  30 min.  COMPLICATIONS:  None.  DISPOSITION:  Stable to PR.  INDICATIONS:  This is a 55 year old female who sustained a left second tarsometatarsal joint fracture dislocation.  She was evaluated by Dr. Otelia Sergeant and referred to me for operative management as well as postoperative care. She was consented for the above procedure.  All risks, which include infection, nerve or vessel injury, pain, arthritis, failure of hardware, malunion, nonunion, TBP and PA were explained.  All questions were answered.  OPERATION:  The patient was brought to the operating room and placed in the supine position, after adequate general endotracheal tube anesthesia was administered, as well as Ancef 1 g IV piggyback.  Due to the fact that the patient had a Betadine and latex allergy, precautions were done and Betadine and latex were note used.  A longitudinal incision between the first and second tarsometatarsal joints were identified and drawn out, after the dorsalis pedis artery was palpated just lateral to the incision.  The limb was then gravity exsanguinated and the tourniquet was elevated to 290 mmHg.  A longitudinal incision over the marked site was made.  Dissection was carried down through the  skin with the scalpel.  Dissection was then carried out through the subcutaneous with knife or scissors.  The tarsometatarsal joint was then identified under C-arm guidance with the [facility].  This was then reduced with a two-point Weber reduction clamp under C-arm guidance. With the fracture site reduced, then visually reduced where it was dorsally subluxed on CT.  A loose frank type screw was then placed in line loose frank ligament.  This was done in the positional fashion, once the reduction was performed.  This was then visualized under C-arm guidance in the AP, lateral and oblique planes.  The screw was placed through a stab wound through the medial ______ .  Clamp was removed.  Final x-rays in the AP, lateral and oblique planes were obtained.  The wound was copiously irrigated with normal saline.  Tourniquet was deflated.  Dorsalis pedis was palpated.  Hemostasis was obtained.  The wound was closed with 4-0 nylon ______ two-step stitch over the dorsal wound, and simple stitch over the medial wound.  Sterile dressing was applied.  Jones dressing was applied.  The patient was taken to PR.  POSTOPERATIVE COURSE:  The patient will be admitted 23 hours.  She will be non-weightbearing for two months, and then weightbearing as tolerated.  Cam walk boot.  We will remove the screw most likely at six months time.  She is to follow up in two weeks, at that time we will obtain three views of the  left foot.  We will also remove the sutures at that time as well. DD:  02/08/01 TD:  02/09/01 Job: [id number]

## 2010-12-10 NOTE — Discharge Summary (Signed)
NAMESHELSEA, HANGARTNER NO.:  [id number]   MEDICAL RECORD NO.:  [id number]          PATIENT TYPE:  INP   LOCATION:  5022                         FACILITY:  [facility]   PHYSICIAN:  Roma Schanz, P.A.DATE OF BIRTH:  [birth year]   DATE OF ADMISSION:  10/25/2004  DATE OF DISCHARGE:  10/27/2004                                 DISCHARGE SUMMARY   Audio too short to transcribe (less than 5 seconds)       CS/MEDQ  D:  02/10/2005  T:  02/10/2005  Job:  [id number]

## 2010-12-10 NOTE — H&P (Signed)
NAMEADRIANE, Carol [name]:  [id number]   MEDICAL RECORD NO.:  [id number]          PATIENT TYPE:  INP   LOCATION:  NA                           FACILITY:  [facility]   PHYSICIAN:  Jene Every, M.D.    DATE OF BIRTH:  January 20, 1956   DATE OF ADMISSION:  10/25/2004  DATE OF DISCHARGE:                                HISTORY & PHYSICAL   CHIEF COMPLAINT:  Low back and left lower extremity pain.   HISTORY OF PRESENT ILLNESS:  Carol [name] is a 55 year old female who is a  long-standing patient of Dr. Shelle Iron.  She has had multiple lumbar surgeries  including lumbar fusion and decompression.  She has actually had a revision  of her fusion following her fall.  She did well for a couple of months  following this, but then noted acute exacerbation of her symptoms following  several falls.  She was initially treated conservative with pain medication,  rest and treatment modalities.  She continued to notice worsening of her  symptoms.  She actually underwent SI injections with minimal relief of her  pain.  MRI was obtained which showed shallow disc protrusion at T8-T9  without evidence of neural compression.  She also obtained a bone stimulator  for pseudoarthrosis, but despite multiple attempts to manage her pain  conservatively,  she continued to note disabling back and lower extremity  pain.  X-rays did not show any interval effusion at L3-L4.  It was felt at  this point, the patient may benefit from a revision of her fusion at L3-L4  with possible reinstrumentation using iliac crest bone graft, repeat T-lift.  The risks and benefits of this surgery were discussed with the patient and  she wishes to proceed.   PAST MEDICAL HISTORY:  1.  Hypothyroidism.  2.  Anxiety.  3.  Hypoglycemia.   CURRENT MEDICATIONS:  1.  Fentanyl 50 mg patch, change every 3 days.  2.  Levothroid 0.075 mg one p.o. daily.  3.  Oxycodone 5 mg p.r.n.  4.  Xanax 5 mg p.r.n.  5.  Promethazine 25 mg  p.r.n.   ALLERGIES:  CODEINE which causes nausea.  BETADINE which causes a rash.   PAST SURGICAL HISTORY:  1.  Cholecystectomy.  2.  Hysterectomy.  3.  Lumbar surgery x3.  4.  Left foot surgery.   SOCIAL HISTORY:  The patient is married.  She denies any tobacco or alcohol  intake.  Family will be caregivers following surgery.  She lives in a two-  story home.  Primary care physician is Lovenia Kim, D.O.   FAMILY HISTORY:  Father deceased at age 4 of massive MI.  Maternal  grandmother has history of coronary artery disease and diabetes.  Mother has  history of coronary artery disease, osteoarthritis, COPD as well as cancer.   REVIEW OF SYSTEMS:  GENERAL:  The patient denies any fevers, chills, night  sweats or bleeding.  NEUROLOGIC:  No blurred or double vision, seizure,  headache or paralysis.  RESPIRATORY:  No shortness of breath, productive  cough or hemoptysis.  CARDIOVASCULAR:  No chest pain or orthopnea.  GENITOURINARY:  No dysuria, hematuria or discharge.  GASTROINTESTINAL:  No  nausea, vomiting or diarrhea, melena or bloody stools.  The patient is  positive for constipation secondary to pain medication.   PHYSICAL EXAMINATION:  VITAL SIGNS:  Pulse 76, respirations 16, BP 98/70.  GENERAL:  This is well-developed, well-nourished, 55 year old female sitting  upright on the table in mild distress.  She does walk with an antalgic gait.  HEENT:  Atraumatic, normocephalic.  Pupils equal round and reactive to  light.  EOMs intact.  NECK:  Supple with no lymphadenopathy.  CHEST:  Clear to auscultation bilaterally.  No rhonchi, wheezes or rales.  BREASTS/GENITALIA:  Not examined, not pertinent to HPI.  HEART:  Regular rate and rhythm without murmurs, rubs or gallops.  ABDOMEN:  Soft, nontender, nondistended.  Bowel sounds x4.  SKIN:  No rashes or lesions are noted.  EXTREMITIES:  The patient does have a global decreased range of motion of  the lumbar spine.  Straight leg on  the right does produce buttock posterior  thigh and calf pain.  Mild buttock pain is noted with straight leg raise on  the right.  She is tender to palpation at the lumbosacral junction.   IMPRESSION:  Pseudoarthrosis at L3-L4.   PLAN:  Revision of her fusion at L3-L4 with reinstrumentation and bone  grafting with possible ICBG and T-lift.      CS/MEDQ  D:  10/20/2004  T:  10/20/2004  Job:  [id number]

## 2010-12-10 NOTE — Discharge Summary (Signed)
Carol [name], Carol [name]                  ACCOUNT NO.:  [id number]   MEDICAL RECORD NO.:  [id number]          PATIENT TYPE:  INP   LOCATION:  4733                         FACILITY:  [facility]   PHYSICIAN:  Santiago Bumpers. Hensel, M.D.DATE OF BIRTH:  1955-11-04   DATE OF ADMISSION:  03/08/2005  DATE OF DISCHARGE:  03/10/2005                                 DISCHARGE SUMMARY   PRIMARY CARE PHYSICIAN:  Dr. Carmela Hurt with [facility].   DISCHARGE DIAGNOSES:  1.  Acute syncope.  2.  Nausea and vomiting.  3.  Headaches.  4.  Fatigue.  5.  Back pain.  6.  Hypothyroidism.   CONSULTATIONS:  None.   PROCEDURES:  1.  A 2-D echocardiogram showed an EF of 55-65% and was otherwise normal.  2.  A head CT showed no acute intracranial abnormalities.  3.  EKG showed normal sinus rhythm, normal axis.  4.  The patient was on telemetry for 48 hours without any events.   DISCHARGE MEDICATIONS:  1.  Fentanyl patch 50 mcg replaced every three days.  2.  Fiberall as needed for constipation.  3.  Synthroid 100 mcg once daily.  4.  Nexium 20 mg daily.  5.  Phenergan 25 mg one q.4h. p.r.n. nausea.  6.  Reglan 10 mg q.a.c. and q.h.s.   HOSPITAL COURSE:  This is a 55 year old female who came in with a chief  complaint of an episode of syncope.   Problem 1.  Syncope.  The patient was feeling tired and fatigued for about a  month and quite dizzy.  On March 08, 2005, she had an episode of syncope in  the shower, and her son found her fallen on the floor.  The patient was  ruled out for an acute intracranial bleed with a head CT.  The patient did  not likely have a PE, as her D-dimer was within normal limits.  The  patient's EKG was normal.  The patient had a 2-D echocardiogram to look for  valvular abnormalities, but this was normal.  The patient was kept on  telemetry for 48 hours to look for arrhythmias, but no events were seen.  We  have ruled out things that could kill her while she has been at  the  hospital.  Most likely, we feel that her story fits with her being tired,  having a decreased p.o. intake, and that in the shower this led secondarily  to a vasovagal event versus orthostasis.  Further workup for this problem  can be done as an outpatient.   Problem 2.  Nausea and vomiting.  The patient apparently has had this  problem for quite some time intermittently.  Most recently, she complains of  abdominal pain, nausea, and vomiting for several weeks.  She started taking  Nexium with some relief but also has had a 10 pound weight loss.  She is  status post hysterectomy, appendectomy, and cholecystectomy.  This might be  due to GERD.  We will add Reglan 10 mg q.a.c. and q.h.s. to see if this  helps with the problem.  Problem 3.  Headaches.  The patient describes scotoma, photophobia,  phonophobia, and nausea.  This certainly meets the criteria for migraines  which would be a new diagnosis for her.  We feel that this is best followed  up as an outpatient.   Problem 4.  Fatigue and excessive sleepiness.  This could be due to a  variety of reasons; however, the patient was ruled out for a sleep study, so  this is not likely sleep apnea.  The patient is hypothyroid, but her TSH was  only slightly elevated while here at the hospital which would not explain  her symptoms.  Her dose of Synthroid has recently been increased.  The  patient has also been on Fentanyl for one year, but the symptoms do not  correlate well with drug effects.  This may be due to depression, though  when asked, she does not seem to have many symptoms or possibly narcolepsy.  We feel that this is best followed up as an outpatient.   Problem 5.  Back pain.  The patient is stable on Fentanyl patch.   Problem 6.  Hypothyroidism.  The patient's TSH was 6.757 on admission, but  she had a recent increase in her dose of levothyroxine.   LABORATORY DATA:  The patient's cardiac enzymes were negative x2.  White   blood cells 9.1, H&H 14.7/44.3.  Sodium 137, potassium 4.2, chloride 104,  bicarb 23, BUN 15, creatinine 0.8, glucose 85.  The patient's D-dimer was  0.33.  TSH 6.757.      Carol [name], M.D.    ______________________________  Santiago Bumpers. Leveda Anna, M.D.    Bennetta Laos  D:  03/10/2005  T:  03/10/2005  Job:  [id number]   cc:   Carmela Hurt, M.D.  [facility]

## 2010-12-10 NOTE — Discharge Summary (Signed)
[place]  Patient:    Carol [name], Carol [name] Visit Number: [id number] MRN: [id number]          Service Type: CAT Location: [facility] 2871 01 Attending Physician:  Armanda Magic Dictated by:   Anselm Lis, N.P. Admit Date:  03/13/2001 Discharge Date: 03/13/2001                             Discharge Summary  DATE OF BIRTH:  1956/03/23  DISCHARGE DIAGNOSES: 1. Possible wound infection versus inflammation wound site, status post    insertion of implantable loop recorder some 10 days prior to admission.    The patient remained afebrile throughout course of hospital stay.  Normal    complete blood count with differential.  Wound culture was negative for    organisms and white blood cells.  Blood cultures were negative.  She had    been admitted with increased pain and redness on chest wound site with    scant amount of yellowish drainage increasing and feeling that wound was    starting to spread open.  There was increased soreness and tenderness at    that site.  She was treated with intravenous vancomycin and discharged on    Cipro and clindamycin with plans for follow-up with Dr. Armanda Magic two    days postdischarge. 2. History of several episodes of syncope over the three weeks prior to    admission, one resulting in a fairly severe fall and fracture of her left    leg, which is currently casted.  She had a subsequent implantation loop    recorder approximately nine days prior to admission.  She has not had any    episodes of shortness of breath or chest discomfort but continues to have    some intermittent episodes of presyncope.  During the course of admission    she remained without symptoms of syncope or near syncope.  Her vital signs    were stable, and she was discharged home to follow up with Dr. Mayford Knife as an    outpatient. 3. Status post fracture of left leg post fall associated with syncopal    episode.  Left leg continues casted.  Good  circulation, sensation, and    movement to toes.  PLAN: 1. Patient discharged home in stable condition. 2. Discharge medications:    a. (New) Tylox 1-2 tablets every six hours p.r.n. pain, prescription given.    b. Paxil 20 mg p.o. q.d.    c. Aspirin as before.    d. Cipro 500 mg p.o. b.i.d.    e. Clindamycin 300 mg q.i.d. 3. Activity:  As before. 4. Special instructions:  Patient advised not to shower, but may bathe at    wound site area. 5. Follow-up:  Dr. Armanda Magic Friday, March 02, 2001, at 1 p.m.  LABORATORY DATA:  WBC 7.5, hemoglobin 13, hematocrit 37.6, platelets 377. Differential within normal range.  Sodium 138, potassium 3.8, chloride 106, CO2 26, BUN 11, creatinine 0.6, and glucose 91.  LFTs within normal range. Admission coagulations were within normal range.  Wound culture was negative for wbcs, no organisms, no growth x 2 days.  Blood culture was no growth x 5 days.  Admission EKG revealed sinus bradycardia at 58 beats per minute without ischemic changes.  Total time preparing this discharge greater than 30 minutes, including filling out and discussion of discharge paperwork with patient and  dictating this discharge summary, as well as filling out prescription. Dictated by:   Anselm Lis, N.P. Attending Physician:  Armanda Magic DD:  04/16/01 TD:  04/16/01 Job: [id number]

## 2010-12-10 NOTE — H&P (Signed)
NAME:  Carol [name], ARMIJO                            ACCOUNT NO.:  [id number]   MEDICAL RECORD NO.:  [id number]                   PATIENT TYPE:  INP   LOCATION:  3741                                 FACILITY:  [facility]   PHYSICIAN:  Colleen Can. Deborah Chalk, M.D.            DATE OF BIRTH:  Feb 13, 1956   DATE OF ADMISSION:  01/13/2004  DATE OF DISCHARGE:  01/14/2004                                HISTORY & PHYSICAL   HISTORY:  Carol [name] is a 55 year old white female who is married with two  children, ages 26 and 68.  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  She has no  cigarette or alcohol use.  Stresses in her life include conflicts with her  43 year old son as well as business stresses.   HISTORY OF PRESENT ILLNESS:  Yesterday she awoke and felt somewhat tired and  has been sleeping well and has had somewhat of an epigastric discomfort and  anorexia for the last couple of weeks.  She went to see Dr. Marisue Brooklyn.  While on the way she had a short episode of chest discomfort radiating to  her left arm and some numbness in her fourth and fifth fingers on her left  arm.  She had some nausea and mild diaphoresis.  EKG was normal.  Labs were  checked and basically were all subsequently okay.  Today she had an episode  of nausea and a cold sweat and sharp chest pain radiating to her left arm  lasting approximately an hour, associated with near-syncope, pallor,  diaphoresis, and shortness of breath.  She called Dr. Elisabeth Most and was  referred to the emergency room.  She took an aspirin on the way with 10+  pain being eased to a 7 and while arriving in the emergency room, she was  given nitroglycerin with the chest pain being relieved to a 4 or 5 pain.  The nausea persisted.  However, with morphine she had pain resolved with  total duration of pain being about one hour.  Phenergan was given with  relief of the nausea.  She is a  nonsmoker, cholesterol levels have been  satisfactory, and blood pressure generally has been in the low ranges, in  the 100-100/70-80 usual.  She has had a history of hypoglycemia but has no  diabetes.  She has a very strong positive family history of heart disease on  her father's side, and her father died of sudden death and had autopsy-  confirmed coronary artery disease.   FAMILY HISTORY:  Her father died at age 25 of sudden cardiac death, autopsy-  confirmed, with the entire paternal side of the family with heart disease.  Mother is age 59 and has COPD and is a smoker.  She had catheterization  which showed early coronary disease.  She has one half-sister who is  alive  and well.   PAST MEDICAL HISTORY:  Allergies:  LATEX and BETADINE.   Current medicines:  None.  She does use p.r.n. Vicodin.   Surgeries:  She has had at least three back surgeries, the most recent being  in January 2005.  She had a hysterectomy in 1996, syncope with a fractured  left leg.  She has had cholecystectomy in 1998.  She has had apparently an  infected loop recorder implanted for evaluation of that syncope.   Hospitalizations:  She has had elbow surgery.   REVIEW OF SYSTEMS:  Recent GI and epigastric discomfort with decreased  appetite.  She has had no recent syncope.  HEENT:  Basically normal.  PULMONARY:  Negative.  GASTROINTESTINAL:  Associated with diarrhea.  She has  had colonoscopy intermittently.  Review of systems otherwise negative except  for back pain and hypoglycemia.   PHYSICAL EXAMINATION:  GENERAL:  She is a somewhat obese white female.  She  is in no acute distress.  VITAL SIGNS:  Blood pressure is 110/70, heart rate is 63.  HEENT:  Negative.  CHEST:  Lungs are clear.  Chest is not tender.  CARDIAC:  Regular rate and rhythm without murmur.  BREASTS:  Not examined.  ABDOMEN:  Soft, nontender, no masses, normal bowel sounds.  EXTREMITIES:  Without edema.   Her EKG is basically  normal.  Cardiac enzymes were negative.   OVERALL IMPRESSION:  1. Chest pain, atypical for coronary artery disease.  2. History of nausea and epigastric discomfort.  3. Lumbar disk disease.  4. History of syncope.   PLAN:  Will admit, rule out myocardial infarction.  Will consider an  adenosine Cardiolite study, have Dr. Mayford Knife see in follow-up during her  hospital stay.                                                Colleen Can. Deborah Chalk, M.D.    SNT/MEDQ  D:  01/13/2004  T:  01/15/2004  Job:  [id number]   cc:   Lovenia Kim, D.O.  [address]  [place]  [place] [address]  Fax: [phone]   Armanda Magic, M.D.  [address]  [place], [place] [address]  Fax: [phone]

## 2010-12-10 NOTE — H&P (Signed)
[place]  Patient:    Carol [name], Carol [name]                         MRN: [id number] Adm. Date:  [id number] Attending:  Armanda Magic CC:         Armanda Magic, M.D.   History and Physical  CHIEF COMPLAINT: Carol [name] is a 55 year old female, with a history of syncope and recent implantation of an implantable loop recorder.  She returns to the hospital with a probable wound infection.  HISTORY OF PRESENT ILLNESS: Carol [name] has had several episodes of syncope over the past several weeks, one resulting in a fairly severe fall and fracture of her left leg.  She had implantation of a loop recorder approximately nine days ago.  She did fairly well for the past several days but then noticed some increased pain and redness associated with the wound. Yesterday she noticed a scant amount of yellowish drainage.  Today she noticed a considerable amount of yellowish drainage on her nightgown.  She also noticed that the wound had started to spread open.  She has felt the edge of a suture at the corner of the wound for the past day or so.  She has noticed increased soreness and tenderness associated with this wound.  She has not had any episodes of shortness of breath or chest pain.  She has continued to have some intermittent episodes of presyncope.  She denies any fever, rigors, syncope, PND, or orthopnea.  CURRENT MEDICATIONS:  1. Paxil 20 mg q.d.  2. Vicodin q.d.  3. Aspirin b.i.d.  ALLERGIES:  1. BETADINE.  2. PENICILLIN.  3. CODEINE.  PAST MEDICAL HISTORY: History of syncope.  SOCIAL HISTORY: The patient is a nonsmoker and does not drink alcohol.  FAMILY HISTORY: Noncontributory.  REVIEW OF SYSTEMS: Negative.  PHYSICAL EXAMINATION:  GENERAL: She is a 55 year old white female in no acute distress.  VITAL SIGNS: She is currently afebrile.  Blood pressure 112/63, heart rate 74.  HEENT/NECK: Carotids 2+.  No bruits.  No JVD.  LUNGS: Clear to  auscultation.  HEART: Regular rate, S1 and S2, no murmurs, rubs, or gallops.  Her wound reveals slight drainage.  There are some areas of erythema around the edges of the wound.  The wound is not completely open although the external edges are not approximated at all.  There is a small end of suture material visible from the lateral corner of the wound.  A very scant amount of clearish drainage can be expressed from the wound.  ABDOMEN: Good bowel sounds, nontender.  No hepatosplenomegaly.  EXTREMITIES: No clubbing, cyanosis, or edema.  Examination nonfocal.  LABORATORY DATA: Pending.  Chest x-ray pending.  IMPRESSION: The patient presents with a recent implantation of a loop recorder.  She has some evidence of infection.  She will probably need to have it removed.  It will be impossible to explore the wound and the device without contaminating the wound itself.  We will check blood cultures at this point and will start vancomycin 1 g now and then every day.  We will give her a clear liquid breakfast tomorrow in case she goes for loop recorder removal. All of her other medical problems remain fairly stable. DD:  02/25/01 TD:  02/25/01 Job: [id number]

## 2010-12-10 NOTE — Discharge Summary (Signed)
NAMEJOOD, RETANA NO.:  [id number]   MEDICAL RECORD NO.:  [id number]          PATIENT TYPE:  INP   LOCATION:  5022                         FACILITY:  [facility]   PHYSICIAN:  Jene Every, M.D.    DATE OF BIRTH:  12-Apr-1956   DATE OF ADMISSION:  10/25/2004  DATE OF DISCHARGE:  10/27/2004                                 DISCHARGE SUMMARY   ADMISSION DIAGNOSES:  1.  Low back and left lower extremity pain.  2.  Hypertension.  3.  Anxiety.  4.  Hyperglycemia.   DISCHARGE DIAGNOSES:  1.  Low back and left lower extremity pain.  2.  Hypertension.  3.  Anxiety.  4.  Hyperglycemia.  5.  Status post revision fusion L3-4   CONSULTATIONS:  PT and OT.   PROCEDURE:  The patient was taken to the OR on October 25, 2004, to undergo  revision of fusion L3-4 and L4-5, removal of hardware at L3, lumbar  decompression at L2-3 with redo decompression at L3-4.   SURGEON:  Jene Every, M.D.   ASSISTANTS:  1.  Roma Schanz, P.A.-C.  2.  Kerrin Champagne, M.D.   ANESTHESIA:  General anesthesia.   COMPLICATIONS:  None.   HISTORY:  Ms. Losey is a 55 year old female who is a longstanding patient  of Dr. Shelle Iron.  She has had multiple lumbar surgeries including fusion and  decompression.  Patient did very well following her last fusion, however,  she is status post fall, she had recurrence of her symptoms.  Patient was  treated conservatively with pain medication treatment modalities.  Her  symptoms continued to be disabling.  It was felt at this point that the  patient will benefit from revision of her fusion at L3-4 with decompression  of T3 and T4.  Risks and benefits of surgery were discussed with the patient  and she wished to proceed.   LABORATORY DATA:  CBC shows white cell count of 7.9, hemoglobin 13.5,  hematocrit 39.7.  Routine CBC are followed throughout the hospital course.  Patient did have slight elevation of white cell count of 11.2, however, this  resolved at the time of discharge with level of 10.3.  At the time of  discharge, hemoglobin was 11.0, hematocrit 32.5, however, the patient was  asymptomatic.  Routine chemistries done preoperatively showed sodium 139,  potassium 5.5, glucose 89, BUN 9, creatinine 0.8.  Throughout the hospital  course, chemistries were monitored.  Sodium remained stable, at the time of  discharge, is 140; slight decrease in potassium at 3.4.  At time of  discharge elevation of glucose to high of 154, at time of discharge, level  was 105; slightly decreased BUN at 5 with creatinine 0.9 at the time of  discharge.  Blood type O positive.   Preoperative chest x-ray showed no active disease.   I do not see a preoperative EKG in the chart.   HOSPITAL COURSE:  The patient was admitted and taken to the OR, underwent  the above stated procedure without difficulty.  She is then transferred to  the PACU  and then to the orthopedic floor for continued postoperative care.  Postoperatively she is placed on the PCA for pain control.   On postoperative day #1, Hemovac drain was placed.  Postoperatively, patient  did very well.  She noted decrease in her lower extremity symptoms.  On  postoperative day #1, she was __________ diet was advanced.  Incision was  clean and dry.  Motor and neurovascular function remained intact.   On postoperative day #2, Foley was discontinued.  PCA was weaned.  PlexiPulse was utilized for DVT prophylaxis.  Hemovac was discontinued.  PT  and OT was consulted.  Patient continued to do fairly well.  On  postoperative day #2, the patient was requesting to be discharged home.  She  was voiding without difficulty.  She had had a bowel movement.  She was up  ambulating with physical therapy.  Pain was well controlled on p.o.  analgesics.  It was felt at this point, the patient could be discharged  home.  Patient was discharged home with home health therapy.  She should  follow up with Dr. Shelle Iron  in approximately 10 days.   DIET:  As tolerated.   WOUND CARE:  Change daily.   Will be discharged in 72 hours.   ACTIVITY:  Walk as tolerated.  Use proper back precautions.   MEDICATIONS:  1.  Fentanyl patch.  2.  Oxycodone.  Patient has prescriptions at home.   CONDITION ON DISCHARGE:  Stable and improved.   FINAL DIAGNOSIS:  Revision and fusion L4-5 with decompression of 2-3 and 3-  4.       CS/MEDQ  D:  02/10/2005  T:  02/10/2005  Job:  [id number]

## 2010-12-10 NOTE — Discharge Summary (Signed)
Carol [name], CALK NO.:  [id number]   MEDICAL RECORD NO.:  [id number]          PATIENT TYPE:  INP   LOCATION:  5022                         FACILITY:  [facility]   PHYSICIAN:  Jene Every, M.D.    DATE OF BIRTH:  08-18-1955   DATE OF ADMISSION:  10/25/2004  DATE OF DISCHARGE:  10/27/2004                                 DISCHARGE SUMMARY   ADMISSION DIAGNOSES:  1.  Pseudoarthrosis, L3-4 fusion.  2.  Hypothyroidism.  3.  Anxiety.  4.  Hyperglycemia.   DISCHARGE DIAGNOSES:  1.  Pseudoarthrosis, L3-4 fusion.  2.  Hypothyroidism.  3.  Anxiety.  4.  Hyperglycemia.  5.  Revision fusion of L3-4 with decompression of L2-3.   CONSULTATIONS:  PT, OT.   PROCEDURE:  The patient was taken to the OR on April 3rd to undergo re-  exploration and fusion of L3-4 and L4-5, removal of hardware including the  left L3 pedicle screw, lumbar decompression of L2-3 with redo decompression  of L3-4, and augmentation of fusion at L3-4 on the left. Surgeon was Dr.  Jene Every; assistant was Dr. Kerrin Champagne, Roma Schanz, PA-c.  Anesthesia was general.   HISTORY:  Ms. Carol [name] is a longstanding patient of Dr. Shelle Iron who has had  multiple lumbar surgeries including lumbar fusion and decompression. The  patient did fairly well following her last fusion. However, following a fall  she had recurrence of her symptoms. The patient was treated conservatively  with pain medication and other treatment modalities without any significant  relief of her symptoms. Due to this disabling nature of the patient's back  pain and lower extremity symptoms it was felt she would benefit from re-  exploration of fusion, L3-4, and possible decompression. The risks and  benefits were discussed and the patient wishes to proceed.   LABORATORY VALUE:  Preoperative CBC showed white cell count of 7.9,  hemoglobin 13.5, hematocrit 39.7. These were repeated throughout the  hospital course. The patient's  white cell count did elevate to a level of  11.2. However at the time of discharge resolved to a level of 10.3. At time  of discharge hemoglobin was 11.3, hematocrit 32.5. Routine chemistries done  preoperatively showed sodium of 139, potassium 4.5, glucose 89, BUN 9,  creatinine 0.8. These were followed by a positive course. Sodium remained  stable in the 140s, slight decrease in potassium at time of discharge 3.4.  Slight elevation of glucose to a  high of 154 and at time of discharge 105.  Blood type O positive. Preoperative chest x-ray showed no active disease. No  EKG noted in the chart.   HOSPITAL COURSE:  The patient was admitted and taken to the OR, and  underwent the above-stated procedure without difficulty. She was then  transferred to the PACU and then to the orthopedic floor for continued  postoperative care. She was placed on PCA postoperatively for pain control.  One Hemovac drain was placed. Postoperatively she did well. She noted  decrease in lower extremity symptoms. On postoperative day one, her vital  signs were  stable. The patient was passing flatus without difficulty.  Incision was clean and dry. On postoperative day #2, the patient continued  to do well, diet was advanced, she was voiding without difficulty, had a  bowel movement, and pain was well controlled on p.o. analgesics. Incision  was clean and dry. Hemovac was discontinued. Motor and neurovascular  function were intact. So at this point the patient could be discharged home  with home health needs met. She should follow up with Dr. Shelle Iron in  approximately 10 days. Activities warm as tolerated. She is to use proper  back precautions and her brace. Wound care change daily. She is told she can  shower in 72 hours.   DISCHARGE MEDICATIONS:  Fentanyl patch, oxycodone.   CONDITION ON DISCHARGE:  Stable and improved.   DIET:  As tolerated.   FINAL DIAGNOSES:  Status post revision of fusion L3-4, decompression  L2-3,  with redo decompression of L3-4.       CS/MEDQ  D:  02/10/2005  T:  02/10/2005  Job:  [id number]

## 2010-12-10 NOTE — Discharge Summary (Signed)
NAME:  Carol [name], Carol [name]                            ACCOUNT NO.:  [id number]   MEDICAL RECORD NO.:  [id number]                   PATIENT TYPE:  INP   LOCATION:  5001                                 FACILITY:  [facility]   PHYSICIAN:  Jene Every, M.D.                 DATE OF BIRTH:  02-27-1956   DATE OF ADMISSION:  08/06/2003  DATE OF DISCHARGE:  08/11/2003                                 DISCHARGE SUMMARY   ADMISSION DIAGNOSES:  1. Herniated nucleus pulposus and degenerative disc disease at L3-4.  2. History of hyperglycemia.   DISCHARGE DIAGNOSES:  1. Herniated nucleus pulposus with degenerative disc disease at L3-4 status     post exploration L4-5 fusion, with a transforaminal lumbar interbody     fusion at L3-4 with instrumentation at this level, with extension of the     fusion to the L3-4 level.  2. History of hyperglycemia.  3. Asymptomatic postoperative anemia.  4. Resolved hyponatremia.  5. Resolved hypokalemia.   PROCEDURES:  The patient was sent to the OR on August 06, 2003 to undergo  exploration and fusion L4-5 with removal of hardware, with extension of the  fusion to include L3-4 using pedicle screw instrumentation.  Transforaminal  interbody fusion was performed with decompression at L3-4 level.  Surgeon:  Dr. Jene Every.  Assistant:  Roma Schanz, P.A.-C. and  Vira Browns, M.D.  Anesthesia:  General.  Complications:  None.   CONSULTS:  PT/OT.   HISTORY:  Carol [name] with a 55 year old female with a known extended history  of low back pain.  She has had a previous fusion and decompression done in  August 2004.  The patient had been doing well until October when she was  attacked by a neighbor's dog.  At the time she fell and noted increase in  her back pain with the immediate onset of left lower extremity  radiculopathy.  The patient was treated conservatively; however, noted the  worsening of her symptoms.  MRI was obtained which showed disc bulge on the  right contacting the L3 nerve root.  The patient then underwent an ESI at  the L3-4 level without any significant relief of her pain.  CT myelogram was  ordered which does show a __________ lesion.  At this time it was felt at  this time the patient would benefit from extension of the fusion and removal  of hardware at the L4-5 level with extension of the fusion to L3-4 with  pedicle screws.  The risks and benefits of surgery were discussed with the  patient and she opted to proceed.   LABORATORY DATA:  Preoperative laboratory shows a white blood cell count  8.1, hemoglobin 13.9, hematocrit 41.0.  Routine H&H were followed throughout  the hospital course.  The patient did have a drop postoperatively to a  hemoglobin of 7.8 and hematocrit 23.4.  At time of discharge  hemoglobin was  8.0, hematocrit 23.6 and the patient was asymptomatic.  Routine chemistries  preoperative:  Sodium was 137, potassium 5.1.  The patient did have a slight  drop in her sodium to 134.  There was a drop in her potassium to 3.2,  elevation of glucose145.  However at time of discharge it normalized to  sodium of 136, potassium 3.9, glucose 90.  Blood type O positive.   Chest x-ray shows no active disease.   EKG shows normal sinus rhythm.   HOSPITAL COURSE:  The patient was admitted and taken to the OR for the above-  stated procedure.  She underwent this without difficulty.  She was then  transferred to the PACU then to the orthopedic floor to continue  postoperative care.  The patient was placed on PC analgesics for pain  relief.  Hemovac was placed.  Routine protocol was followed for DVT  prophylaxis.  Postoperative day #1 the patient was doing well.  Pain was  well controlled with PC analgesics.  She notes __________ lower extremity  pain.  She noted soreness in the lumbar spine.  She did have a drop in her  hemoglobin level to 12.0, hematocrit 31.4.  however, the patient was  asymptomatic.  By postoperative  day #2 the patient continued to do well.  She had not passed flatus.  Foley was discontinued.  She did spike a slight  temperature of 100.5, drop in the hemoglobin to 8.8.  However, she continued  to remain asymptomatic.  She had a slight drop in her sodium to 134,  potassium 3.2.  This was treated accordingly.  PT/OT was consulted for out  of bed.  Hemoglobin would be checked tomorrow.  Hemovac was discontinued  without difficulty.  The patient continued to do well from a pain  standpoint.  PCA was discontinued.  She was doing well on p.o. analgesics.  Her hemoglobin was continued to be monitored.  This did drop over the next  couple of days.  However, the patient remained asymptomatic.  At the lowest  point hemoglobin was 7.8 and hematocrit 23.4.  Iron tablets were continued.  The patient did not want a transfusion at this point.  She continued to do  well with therapy.  She had a bowel movement and her diet was advanced as  tolerated.  Postoperative day #5 the patient was doing well.  She had been  up without difficulty.  She denied any dizziness or fatigue and no nausea or  vomiting.  She was voiding and had bowel movement without difficulty.  Hemoglobin was 8.0.  Motor and neurovascular function remained intact.  The  patient wishes to be discharged home at this point.   DISPOSITION:  The patient was stable to be discharged home.  She should have  a repeat H&H done in approximately 1 day to make sure her hemoglobin  continued to normalize.  She will continue with her iron supplement.  Follow  up in the office with Dr. Shelle Iron in approximately 10-14 days.  She should  change her dressing daily and activity is as tolerated.  She should walk on  a daily basis.  No bending, stooping, or twisting.   DISCHARGE MEDICATIONS:  Percocet, Robaxin, Trinsicon, vitamin C.   DIET:  As tolerated.   FINAL DIAGNOSIS:  Status post extension of lumbar fusion.      Roma Schanz, P.A.  Jene Every, M.D.    CS/MEDQ  D:  08/25/2003  T:  08/25/2003  Job:  [id number]

## 2011-01-16 ENCOUNTER — Inpatient Hospital Stay (INDEPENDENT_AMBULATORY_CARE_PROVIDER_SITE_OTHER)
Admission: RE | Admit: 2011-01-16 | Discharge: 2011-01-16 | Disposition: A | Discharge status: Home / Self Care | Payer: 59 | Source: Ambulatory Visit | Attending: Emergency Medicine | Admitting: Emergency Medicine

## 2011-01-16 ENCOUNTER — Ambulatory Visit (INDEPENDENT_AMBULATORY_CARE_PROVIDER_SITE_OTHER): Payer: 59

## 2011-01-16 DIAGNOSIS — IMO0002 Reserved for concepts with insufficient information to code with codable children: Secondary | ICD-10-CM

## 2011-02-12 ENCOUNTER — Emergency Department (HOSPITAL_COMMUNITY)
Admission: EM | Admit: 2011-02-12 | Discharge: 2011-02-13 | Disposition: A | Discharge status: Home / Self Care | Payer: 59 | Attending: Emergency Medicine | Admitting: Emergency Medicine

## 2011-02-12 ENCOUNTER — Emergency Department (HOSPITAL_COMMUNITY): Payer: 59

## 2011-02-12 DIAGNOSIS — S40029A Contusion of unspecified upper arm, initial encounter: Secondary | ICD-10-CM | POA: Insufficient documentation

## 2011-02-12 DIAGNOSIS — F329 Major depressive disorder, single episode, unspecified: Secondary | ICD-10-CM | POA: Insufficient documentation

## 2011-02-12 DIAGNOSIS — R51 Headache: Secondary | ICD-10-CM | POA: Insufficient documentation

## 2011-02-12 DIAGNOSIS — F3289 Other specified depressive episodes: Secondary | ICD-10-CM | POA: Insufficient documentation

## 2011-02-12 DIAGNOSIS — IMO0002 Reserved for concepts with insufficient information to code with codable children: Secondary | ICD-10-CM | POA: Insufficient documentation

## 2011-02-12 DIAGNOSIS — F1010 Alcohol abuse, uncomplicated: Secondary | ICD-10-CM | POA: Insufficient documentation

## 2011-02-12 DIAGNOSIS — W108XXA Fall (on) (from) other stairs and steps, initial encounter: Secondary | ICD-10-CM | POA: Insufficient documentation

## 2011-02-12 DIAGNOSIS — S40019A Contusion of unspecified shoulder, initial encounter: Secondary | ICD-10-CM | POA: Insufficient documentation

## 2011-02-12 DIAGNOSIS — M79609 Pain in unspecified limb: Secondary | ICD-10-CM | POA: Insufficient documentation

## 2011-02-12 DIAGNOSIS — M542 Cervicalgia: Secondary | ICD-10-CM | POA: Insufficient documentation

## 2011-02-12 DIAGNOSIS — Z9889 Other specified postprocedural states: Secondary | ICD-10-CM | POA: Insufficient documentation

## 2011-02-12 DIAGNOSIS — Z79899 Other long term (current) drug therapy: Secondary | ICD-10-CM | POA: Insufficient documentation

## 2011-02-12 DIAGNOSIS — L988 Other specified disorders of the skin and subcutaneous tissue: Secondary | ICD-10-CM | POA: Insufficient documentation

## 2011-02-12 DIAGNOSIS — E039 Hypothyroidism, unspecified: Secondary | ICD-10-CM | POA: Insufficient documentation

## 2011-02-12 DIAGNOSIS — S0180XA Unspecified open wound of other part of head, initial encounter: Secondary | ICD-10-CM | POA: Insufficient documentation

## 2011-02-12 LAB — BASIC METABOLIC PANEL
BUN: 12 mg/dL (ref 6–23)
CO2: 21 mEq/L (ref 19–32)
Calcium: 9.2 mg/dL (ref 8.4–10.5)
Chloride: 93 mEq/L — ABNORMAL LOW (ref 96–112)
Creatinine, Ser: 0.72 mg/dL (ref 0.50–1.10)
GFR calc Af Amer: >60 mL/min (ref >60)
GFR calc non Af Amer: >60 mL/min (ref >60)
Glucose, Bld: 93 mg/dL (ref 70–99)
Potassium: 3.4 mEq/L — ABNORMAL LOW (ref 3.5–5.1)
Sodium: 131 mEq/L — ABNORMAL LOW (ref 135–145)

## 2011-02-12 LAB — ETHANOL: Alcohol, Ethyl (B): 131 mg/dL — ABNORMAL HIGH (ref 0–11)

## 2011-02-12 LAB — DIFFERENTIAL
Basophils Absolute: 0 10*3/uL (ref 0.0–0.1)
Basophils Relative: 0 % (ref 0–1)
Eosinophils Absolute: 0.2 10*3/uL (ref 0.0–0.7)
Eosinophils Relative: 1 % (ref 0–5)
Lymphocytes Relative: 27 % (ref 12–46)
Lymphs Abs: 3 10*3/uL (ref 0.7–4.0)
Monocytes Absolute: 0.7 10*3/uL (ref 0.1–1.0)
Monocytes Relative: 6 % (ref 3–12)
Neutro Abs: 7.3 10*3/uL (ref 1.7–7.7)
Neutrophils Relative %: 65 % (ref 43–77)

## 2011-02-12 LAB — CBC
HCT: 39.8 % (ref 36.0–46.0)
Hemoglobin: 13.8 g/dL (ref 12.0–15.0)
MCH: 30.6 pg (ref 26.0–34.0)
MCHC: 34.7 g/dL (ref 30.0–36.0)
MCV: 88.2 fL (ref 78.0–100.0)
Platelets: 332 10*3/uL (ref 150–400)
RBC: 4.51 MIL/uL (ref 3.87–5.11)
RDW: 13.2 % (ref 11.5–15.5)
WBC: 11.2 10*3/uL — ABNORMAL HIGH (ref 4.0–10.5)

## 2011-02-12 LAB — PROTIME-INR
INR: 1.05 (ref 0.00–1.49)
Prothrombin Time: 13.9 seconds (ref 11.6–15.2)

## 2011-02-12 LAB — APTT: aPTT: 39 seconds — ABNORMAL HIGH (ref 24–37)

## 2011-02-13 LAB — RAPID URINE DRUG SCREEN, HOSP PERFORMED
Amphetamines: NOT DETECTED
Barbiturates: NOT DETECTED
Benzodiazepines: NOT DETECTED
Cocaine: NOT DETECTED
Opiates: NOT DETECTED
Tetrahydrocannabinol: NOT DETECTED

## 2011-02-16 ENCOUNTER — Other Ambulatory Visit (HOSPITAL_COMMUNITY)
Admission: RE | Admit: 2011-02-16 | Discharge: 2011-02-16 | Disposition: A | Discharge status: Home / Self Care | Payer: 59 | Source: Ambulatory Visit | Attending: Internal Medicine | Admitting: Internal Medicine

## 2011-02-16 ENCOUNTER — Other Ambulatory Visit: Payer: Self-pay | Admitting: Internal Medicine

## 2011-02-16 DIAGNOSIS — Z01419 Encounter for gynecological examination (general) (routine) without abnormal findings: Secondary | ICD-10-CM | POA: Insufficient documentation

## 2011-05-02 ENCOUNTER — Ambulatory Visit (HOSPITAL_BASED_OUTPATIENT_CLINIC_OR_DEPARTMENT_OTHER)
Admission: RE | Admit: 2011-05-02 | Discharge: 2011-05-02 | Disposition: A | Discharge status: Home / Self Care | Payer: Medicare Other | Source: Ambulatory Visit | Attending: Specialist | Admitting: Specialist

## 2011-05-02 DIAGNOSIS — M2240 Chondromalacia patellae, unspecified knee: Secondary | ICD-10-CM | POA: Insufficient documentation

## 2011-05-02 DIAGNOSIS — M2340 Loose body in knee, unspecified knee: Secondary | ICD-10-CM | POA: Insufficient documentation

## 2011-05-02 DIAGNOSIS — F329 Major depressive disorder, single episode, unspecified: Secondary | ICD-10-CM | POA: Insufficient documentation

## 2011-05-02 DIAGNOSIS — F3289 Other specified depressive episodes: Secondary | ICD-10-CM | POA: Insufficient documentation

## 2011-05-02 DIAGNOSIS — M9420 Chondromalacia, unspecified site: Secondary | ICD-10-CM | POA: Insufficient documentation

## 2011-05-02 DIAGNOSIS — M23329 Other meniscus derangements, posterior horn of medial meniscus, unspecified knee: Secondary | ICD-10-CM | POA: Insufficient documentation

## 2011-05-02 DIAGNOSIS — E669 Obesity, unspecified: Secondary | ICD-10-CM | POA: Insufficient documentation

## 2011-05-02 LAB — POCT HEMOGLOBIN-HEMACUE: Hemoglobin: 13.7 g/dL (ref 12.0–15.0)

## 2011-05-03 LAB — DIFFERENTIAL
Basophils Absolute: 0.1
Basophils Relative: 1
Eosinophils Absolute: 0.1 — ABNORMAL LOW
Eosinophils Relative: 1
Lymphocytes Relative: 18
Lymphs Abs: 2.1
Monocytes Absolute: 0.6
Monocytes Relative: 6
Neutro Abs: 8.9 — ABNORMAL HIGH
Neutrophils Relative %: 76

## 2011-05-03 LAB — HEMOGLOBIN A1C
Hgb A1c MFr Bld: 5.5
Mean Plasma Glucose: 119

## 2011-05-03 LAB — I-STAT 8, (EC8 V) (CONVERTED LAB)
BUN: 13
Bicarbonate: 26.9 — ABNORMAL HIGH
Chloride: 104
Glucose, Bld: 94
HCT: 47 — ABNORMAL HIGH
Hemoglobin: 16 — ABNORMAL HIGH
Operator id: 151321
Potassium: 3.9
Sodium: 140
TCO2: 28
pCO2, Ven: 50
pH, Ven: 7.34 — ABNORMAL HIGH

## 2011-05-03 LAB — TROPONIN I: Troponin I: 0.02

## 2011-05-03 LAB — CK TOTAL AND CKMB (NOT AT ARMC)
CK, MB: 0.8
Relative Index: INVALID
Total CK: 42

## 2011-05-03 LAB — LIPID PANEL
Cholesterol: 156
HDL: 41
LDL Cholesterol: 100 — ABNORMAL HIGH
Total CHOL/HDL Ratio: 3.8
Triglycerides: 76
VLDL: 15

## 2011-05-03 LAB — CARDIAC PANEL(CRET KIN+CKTOT+MB+TROPI)
CK, MB: 1
CK, MB: 1
Relative Index: INVALID
Relative Index: INVALID
Total CK: 39
Total CK: 42
Troponin I: 0.01
Troponin I: 0.03

## 2011-05-03 LAB — POCT CARDIAC MARKERS
CKMB, poc: <1 — ABNORMAL LOW
CKMB, poc: <1 — ABNORMAL LOW
Myoglobin, poc: 79.8
Myoglobin, poc: 96.3
Operator id: 151321
Operator id: 151321
Troponin i, poc: <0.05
Troponin i, poc: <0.05

## 2011-05-03 LAB — COMPREHENSIVE METABOLIC PANEL
ALT: 25
AST: 19
Albumin: 3.7
Alkaline Phosphatase: 96
BUN: 10
CO2: 27
Calcium: 9.1
Chloride: 106
Creatinine, Ser: 0.76
GFR calc Af Amer: >60
GFR calc non Af Amer: >60
Glucose, Bld: 94
Potassium: 3.6
Sodium: 141
Total Bilirubin: 1
Total Protein: 7.3

## 2011-05-03 LAB — CBC
HCT: 42.5
Hemoglobin: 14.5
MCHC: 34.1
MCV: 86.2
Platelets: 357
RBC: 4.93
RDW: 13.6
WBC: 11.8 — ABNORMAL HIGH

## 2011-05-03 LAB — PROTIME-INR
INR: 1
Prothrombin Time: 13.6

## 2011-05-03 LAB — TSH: TSH: 0.178 — ABNORMAL LOW

## 2011-05-03 LAB — POCT I-STAT CREATININE
Creatinine, Ser: 0.9
Operator id: 151321

## 2011-05-03 LAB — APTT: aPTT: 34

## 2011-05-03 LAB — D-DIMER, QUANTITATIVE: D-Dimer, Quant: 0.28

## 2011-05-04 LAB — BLOOD GAS, ARTERIAL
Acid-base deficit: 0.4
Bicarbonate: 23.5
Drawn by: 122601
FIO2: 0.21
O2 Saturation: 96.1
Patient temperature: 98.6
TCO2: 24.6
pCO2 arterial: 36.7
pH, Arterial: 7.421 — ABNORMAL HIGH
pO2, Arterial: 77.1 — ABNORMAL LOW

## 2011-05-05 NOTE — Op Note (Signed)
  NAMESHAUN, Carol [name]:  [id number]  MEDICAL RECORD NO.:  [id number]  LOCATION:                               FACILITY:  [facility]  PHYSICIAN:  Jene Every, M.D.    DATE OF BIRTH:  [birth year]  DATE OF PROCEDURE:  05/02/2011 DATE OF DISCHARGE:                              OPERATIVE REPORT   PREOPERATIVE DIAGNOSIS:  Degenerative meniscus tear of the left knee.  POSTOPERATIVE DIAGNOSIS: 1. Degenerative meniscus tear of the left knee. 2. 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  PROCEDURE PERFORMED: 1. Left knee arthroscopy. 2. Partial medial meniscectomy. 3. Chondroplasty of patella and medial femoral condyle.  ANESTHESIA:  General.  ASSISTANT:  None.  BRIEF HISTORY:  This is a 55 year old knee pain, locking, giving way, mechanical symptoms, refractory to conservative treatment.  Signs and symptoms consistent with refractory symptomatic osteoarthritis and degenerative meniscus tear indicated for arthroscopy.  Risks and benefits were discussed including bleeding, infection, no change in symptoms, worsening symptoms, need for repeat debridement, DVT, PE, anesthetic complications, etc.  TECHNIQUE:  With the patient in supine position after induction of adequate anesthesia, 2 g Kefzol left lower extremity prepped draped usual sterile fashion.  A lateral parapatellar portal was fashioned with a 11 blade.  Ingress cannula atraumatically placed.  Irrigant was utilized to insufflate the joint.  Due to the patient's size and obesity, a leg holder precluded the suprapatellar portal.  Irrigant was utilized to insufflate joints.  A 65 mmHg was utilized throughout the case.  Under direct visualization, medial parapatellar portal was fashioned with a 11 blade after localization with an 18-gauge needle sparing the medial meniscus.  Noted was extensive grade 3 changes medial femoral condyle, loose cartilaginous debris was noted as well,  with degenerative fraying and tearing of the posterior third of the medial meniscus.  Introduced a 3.5 Engineer, site.  Performed a light chondroplasty medial femoral condyle, evacuated loose bodies, and debrided the posterior horn of the medial meniscus to a stable base.  It was further probed.  It was stable base.  Minor grade 2 changes of the tibia. ACL was unremarkable.  Lateral compartment revealed minor degenerative changes patellofemoral joint.  Mild fraying of the meniscus. Suprapatellar pouch revealed extensive grade 3 changes of the patella. Normal patellofemoral tracking.  Gutters were unremarkable. Performed light chondroplasty of the patella.  The knee was copiously lavaged.  Reexamined all compartments.  No further pathology.  Amenable arthroscopic intervention therefore removed all instrumentation. Portals were closed with 4-0 nylon simple sutures.  Copious Marcaine with epinephrine was infiltrated in the joint.  Wound was dressed sterilely.  Awoken without difficulty and transported to recovery in satisfactory condition. The patient tolerated the procedure well without complications. Assistants none.  Minimal blood loss.     Jene Every, M.D.     Cordelia Pen  D:  05/02/2011  T:  05/03/2011  Job:  [id number]  Electronically Signed by Jene Every M.D. on 05/05/2011 02:25:52 PM

## 2011-05-06 ENCOUNTER — Inpatient Hospital Stay (HOSPITAL_COMMUNITY)
Admission: EM | Admit: 2011-05-06 | Discharge: 2011-05-07 | DRG: 313 | Disposition: A | Discharge status: Home / Self Care | Payer: Medicare Other | Attending: Internal Medicine | Admitting: Internal Medicine

## 2011-05-06 ENCOUNTER — Emergency Department (HOSPITAL_COMMUNITY): Payer: Medicare Other

## 2011-05-06 DIAGNOSIS — G43909 Migraine, unspecified, not intractable, without status migrainosus: Secondary | ICD-10-CM | POA: Diagnosis present

## 2011-05-06 DIAGNOSIS — F329 Major depressive disorder, single episode, unspecified: Secondary | ICD-10-CM | POA: Diagnosis present

## 2011-05-06 DIAGNOSIS — Z7982 Long term (current) use of aspirin: Secondary | ICD-10-CM

## 2011-05-06 DIAGNOSIS — F3289 Other specified depressive episodes: Secondary | ICD-10-CM | POA: Diagnosis present

## 2011-05-06 DIAGNOSIS — R079 Chest pain, unspecified: Principal | ICD-10-CM | POA: Diagnosis present

## 2011-05-06 DIAGNOSIS — E039 Hypothyroidism, unspecified: Secondary | ICD-10-CM | POA: Diagnosis present

## 2011-05-06 DIAGNOSIS — I10 Essential (primary) hypertension: Secondary | ICD-10-CM | POA: Diagnosis present

## 2011-05-06 LAB — COMPREHENSIVE METABOLIC PANEL
ALT: 51 U/L — ABNORMAL HIGH (ref 0–35)
AST: 36 U/L (ref 0–37)
Albumin: 3.6 g/dL (ref 3.5–5.2)
Alkaline Phosphatase: 149 U/L — ABNORMAL HIGH (ref 39–117)
BUN: 12 mg/dL (ref 6–23)
CO2: 30 mEq/L (ref 19–32)
Calcium: 9.8 mg/dL (ref 8.4–10.5)
Chloride: 97 mEq/L (ref 96–112)
Creatinine, Ser: 0.85 mg/dL (ref 0.50–1.10)
GFR calc Af Amer: 89 mL/min — ABNORMAL LOW (ref >90)
GFR calc non Af Amer: 77 mL/min — ABNORMAL LOW (ref >90)
Glucose, Bld: 91 mg/dL (ref 70–99)
Potassium: 4 mEq/L (ref 3.5–5.1)
Sodium: 137 mEq/L (ref 135–145)
Total Bilirubin: 0.5 mg/dL (ref 0.3–1.2)
Total Protein: 7.6 g/dL (ref 6.0–8.3)

## 2011-05-06 LAB — CBC
HCT: 43.3 % (ref 36.0–46.0)
Hemoglobin: 14.7 g/dL (ref 12.0–15.0)
MCH: 31.2 pg (ref 26.0–34.0)
MCHC: 33.9 g/dL (ref 30.0–36.0)
MCV: 91.9 fL (ref 78.0–100.0)
Platelets: 316 10*3/uL (ref 150–400)
RBC: 4.71 MIL/uL (ref 3.87–5.11)
RDW: 14 % (ref 11.5–15.5)
WBC: 10 10*3/uL (ref 4.0–10.5)

## 2011-05-06 LAB — POCT I-STAT TROPONIN I: Troponin i, poc: 0 ng/mL (ref 0.00–0.08)

## 2011-05-06 LAB — DIFFERENTIAL
Basophils Absolute: 0.1 10*3/uL (ref 0.0–0.1)
Basophils Relative: 1 % (ref 0–1)
Eosinophils Absolute: 0.3 10*3/uL (ref 0.0–0.7)
Eosinophils Relative: 3 % (ref 0–5)
Lymphocytes Relative: 32 % (ref 12–46)
Lymphs Abs: 3.2 10*3/uL (ref 0.7–4.0)
Monocytes Absolute: 0.7 10*3/uL (ref 0.1–1.0)
Monocytes Relative: 7 % (ref 3–12)
Neutro Abs: 5.8 10*3/uL (ref 1.7–7.7)
Neutrophils Relative %: 58 % (ref 43–77)

## 2011-05-06 LAB — PROTIME-INR
INR: 0.94 (ref 0.00–1.49)
Prothrombin Time: 12.8 seconds (ref 11.6–15.2)

## 2011-05-06 LAB — APTT: aPTT: 34 seconds (ref 24–37)

## 2011-05-06 MED ORDER — IOHEXOL 300 MG/ML  SOLN
100.0000 mL | Freq: Once | INTRAMUSCULAR | Status: AC | PRN
  Administered 2011-05-06: 100 mL via INTRAVENOUS

## 2011-05-07 LAB — TSH: TSH: 15.207 u[IU]/mL — ABNORMAL HIGH (ref 0.350–4.500)

## 2011-05-07 LAB — BASIC METABOLIC PANEL
BUN: 13 mg/dL (ref 6–23)
CO2: 30 mEq/L (ref 19–32)
Calcium: 9.6 mg/dL (ref 8.4–10.5)
Chloride: 99 mEq/L (ref 96–112)
Creatinine, Ser: 0.79 mg/dL (ref 0.50–1.10)
GFR calc Af Amer: >90 mL/min (ref >90)
GFR calc non Af Amer: >90 mL/min (ref >90)
Glucose, Bld: 111 mg/dL — ABNORMAL HIGH (ref 70–99)
Potassium: 3.9 mEq/L (ref 3.5–5.1)
Sodium: 139 mEq/L (ref 135–145)

## 2011-05-07 LAB — CBC
HCT: 42.1 % (ref 36.0–46.0)
Hemoglobin: 13.9 g/dL (ref 12.0–15.0)
MCH: 30.6 pg (ref 26.0–34.0)
MCHC: 33 g/dL (ref 30.0–36.0)
MCV: 92.7 fL (ref 78.0–100.0)
Platelets: 337 10*3/uL (ref 150–400)
RBC: 4.54 MIL/uL (ref 3.87–5.11)
RDW: 14.3 % (ref 11.5–15.5)
WBC: 9.5 10*3/uL (ref 4.0–10.5)

## 2011-05-07 LAB — LIPID PANEL
Cholesterol: 169 mg/dL (ref 0–200)
HDL: 44 mg/dL (ref >39)
LDL Cholesterol: 88 mg/dL (ref 0–99)
Total CHOL/HDL Ratio: 3.8 RATIO
Triglycerides: 183 mg/dL — ABNORMAL HIGH (ref <150)
VLDL: 37 mg/dL (ref 0–40)

## 2011-05-07 LAB — CARDIAC PANEL(CRET KIN+CKTOT+MB+TROPI)
CK, MB: 1.7 ng/mL (ref 0.3–4.0)
CK, MB: 1.7 ng/mL (ref 0.3–4.0)
CK, MB: 1.6 ng/mL (ref 0.3–4.0)
Relative Index: INVALID (ref 0.0–2.5)
Relative Index: INVALID (ref 0.0–2.5)
Relative Index: INVALID (ref 0.0–2.5)
Total CK: 35 U/L (ref 7–177)
Total CK: 31 U/L (ref 7–177)
Total CK: 32 U/L (ref 7–177)
Troponin I: <0.3 ng/mL (ref <0.30)
Troponin I: <0.3 ng/mL (ref <0.30)
Troponin I: <0.3 ng/mL (ref <0.30)

## 2011-05-07 LAB — SEDIMENTATION RATE: Sed Rate: 27 mm/hr — ABNORMAL HIGH (ref 0–22)

## 2011-05-07 LAB — HEMOGLOBIN A1C
Hgb A1c MFr Bld: 5.6 % (ref <5.7)
Mean Plasma Glucose: 114 mg/dL (ref <117)

## 2011-05-09 NOTE — Discharge Summary (Signed)
[name], [name]:  [id number]  MEDICAL RECORD NO.:  [id number]  LOCATION:  2027                         FACILITY:  [facility]  PHYSICIAN:  Thad Ranger, MD       DATE OF BIRTH:  12-08-1955  DATE OF ADMISSION:  05/06/2011 DATE OF DISCHARGE:                        DISCHARGE SUMMARY - REFERRING   PRIMARY CARE PHYSICIAN:  Lucky Cowboy, MD.  FINAL DISCHARGE DIAGNOSES: 1. Atypical chest pain, resolved. 2. Hypothyroidism. 3. Chronic headaches versus migraine. 4. Depression/anxiety. 5. Hypothyroidism.  DISCHARGE MEDICATIONS: 1. Aspirin 81 mg p.o. daily. 2. Synthroid 125 mcg p.o. daily. 3. Oxycodone 10 mg p.o. every 6 hours as needed for pain. 4. Cymbalta 60 mg p.o. q.a.m. 5. Xanax 0.5 mg p.o. at bedtime. 6. Phenergan 25 mg every 6 hours as needed for nausea. 7. Topamax 50 mg p.o. at bedtime. 8. Omeprazole 20 mg p.o. daily. 9. Clonidine 0.1 mg p.r.n. to take only if SBP is above 170 or     diastolic is above 100.  BRIEF HISTORY OF PRESENT ILLNESS:  At the time of admission, Carol [name] is a 55 year old female, who has a history of hypothyroidism, polycythemia vera, DVT in the past, presented to the emergency room with chief complaint of chest discomfort, shortness of breath, and headaches. The patient recently had a knee surgery done this past Monday on May 03, 2011, by Dr. Shelle Iron.  The patient stated that she after the surgery started to experience some episodes of chest pain and shortness of breath.  This would occur at random with both at rest and exertion.  She also noticed that her blood pressure was elevated at 180 at home and does not have a prior history of hypertension.  She also has associated headaches and headaches have been present since she had a fall in July when she had fallen another time.  For details, please refer to the admission note dictated by Dr. Erick Blinks.  RADIOLOGICAL DATA:  CT angiogram of the chest showed negative  for pulmonary embolism.  No acute abnormality.  Echo with contrast on May 07, 2011, showed EF of 55 to 60%, normal wall motion, no regional wall motion abnormalities.  No defect or patent foramen ovale was identified in the atrial septum.  BRIEF HOSPITALIZATION COURSE:  Carol [name] is a 55 year old female, who was admitted with atypical chest pain. 1. Atypical chest pain, completely resolved.  The patient was admitted     to the tele monitor floor and ruled out for acute ACS.  She     underwent 2D echo with contrast, which was essentially unremarkable     the report as dictated above.  She was also placed on omeprazole     for possibility of esophagitis.  Given the constellation of her     headaches and reported high blood pressure, even though the blood     pressure readings in the hospital were completely normal, in that     basis, the blood work for ruling out pheochromocytoma is sent out,     which should be followed up by her primary care physician.  I also     ordered ESR to rule out  any temporal arteritis, however,     differential is low at this time. 2. Hypothyroidism.  The patient was continued on Synthroid. 3. Accelerated hypertension.  This appears to be somewhat transient     after the surgery.  The patient's blood pressure readings remained     quite stable during the hospitalization.  I gave her a prescription     for clonidine only to be taken in the case of accelerated     hypertension with symptoms.  The patient will follow up with Dr.     Oneta Rack in next 7-10 days for hospital follow up.  Discharge time 35 minutes.     Thad Ranger, MD     RR/MEDQ  D:  05/07/2011  T:  05/07/2011  Job:  [id number]  cc:   Lucky Cowboy, M.D.  Electronically Signed by Andres Labrum RAI  on 05/09/2011 02:27:57 PM

## 2011-05-12 LAB — CATECHOLAMINES, FRACTIONATED, PLASMA
Epinephrine: 46 pg/mL
Norepinephrine: 606 pg/mL
Total Catecholamines (Nor+Epi): 652 pg/mL

## 2011-05-12 LAB — METANEPHRINES, PLASMA
Metanephrine, Free: <25 pg/mL (ref <=57)
Normetanephrine, Free: 90 pg/mL (ref <=148)
Total Metanephrines-Plasma: 90 pg/mL (ref <=205)

## 2011-05-14 NOTE — H&P (Signed)
Carol [name], Carol [name]:  [id number]  MEDICAL RECORD NO.:  [id number]  LOCATION:  2027                         FACILITY:  [facility]  PHYSICIAN:  Erick Blinks, MD     DATE OF BIRTH:  [birth year]  DATE OF ADMISSION:  05/06/2011 DATE OF DISCHARGE:                             HISTORY & PHYSICAL   PRIMARY CARE PHYSICIAN:  Dr. Ronne Binning.  Formerly, Dr. Marisue Brooklyn.  CHIEF COMPLAINT:  Chest discomfort and headache.  HISTORY OF PRESENT ILLNESS:  This is a 55 year old female who has a history of hypothyroidism, history of polycythemia vera, as well as DVT in the past.  The patient presents to the emergency room today with complaints of chest discomfort, shortness of breath, and headache.  She reports that she had recently had knee surgery done on this past Monday by Dr. Shelle Iron.  She reported, on Wednesday, she started to experience some episodes of chest pain and shortness of breath.  This would occur at random both at rest and on exertion.  She has also noticed that she was checking her blood pressure at home and was found to be elevated at 180.  She does not report a history of hypertension.  She said that she had associated headache with this, but her headaches have been right- sided and have been present since she had a fall in July where she suffered some head trauma.  She was evaluated in the emergency room today with a CT angio of the chest to rule out pulmonary embolism and this was found to be negative.  She was subsequently referred to the hospital for admission to rule out any underlying cardiac chest pain. She reports that on and off, she has been having headaches since the July of this past year.  She had fallen at that time and a CT of the head done ruled out any intracranial injury, but she says persistently she has had a right-sided headache since then.  She also reports that when she had associated chest pain she does also have some shortness  of breath and some diaphoresis as well.  She becomes very anxious and these episodes last a few hours and then resolve on their own.  She reports having a stress test done approximately a year and a half ago which she reported to be normal.  She denies any fever, cough, or vomiting, although she does have some nausea.  No diarrhea or abdominal pain.  No dysuria.  No changes in her vision.  No unilateral weakness or numbness and basically denies any other complaints.  PAST MEDICAL HISTORY: 1. Hypothyroidism. 2. Depression. 3. History of polycythemia vera. 4. History of DVT in the past.  ALLERGIES:  IODINE causes a rash.  CODEINE causes nausea and vomiting.  MEDICATIONS PRIOR TO ADMISSION: 1. Synthroid 125 mcg p.o. daily. 2. Cymbalta 60 mg p.o. daily. 3. Aspirin 81 mg p.o. daily. 4. Xanax 0.5 mg p.o. nightly p.r.n.  SOCIAL HISTORY:  The patient does not smoke.  She was drinking alcohol on occasion, but since her fall she has not been drinking any alcohol. Denies any illicit drug use.  FAMILY HISTORY:  Father died in  his 30s from an acute coronary syndrome.  REVIEW OF SYSTEMS:  All systems reviewed and pertinent positives as stated in the HPI.  PHYSICAL EXAMINATION:  VITAL SIGNS:  Temperature 97.8, respiratory rate of 18, pulse rate of 87, blood pressure 131/82, pulse ox 93% on room air.  GENERAL:  The patient in no acute distress, lying comfortably in bed. HEENT:  Normocephalic, atraumatic.  Pupils are equal, round, reactive to light. NECK:  Supple. CHEST:  Clear to auscultation bilaterally. CARDIAC:  S1, S2 with a regular rate and rhythm.  ABDOMEN:  Soft, nontender.  Bowel sounds are active.  EXTREMITIES:  Show no signs of cyanosis, clubbing, or edema. NEUROLOGIC:  The patient has 5/5 strength bilaterally.  Cranial nerves II-XII appear to be grossly intact.  LABORATORY DATA:  Sodium 137, potassium 4.0, chloride 97, bicarb 30, glucose 91, BUN 12, creatinine 0.85.  Liver  function tests are within normal limits.  INR of 0.94.  Cardiac enzymes were negative x1.  WBC 10.0, hemoglobin 14.7, platelet count of 316.  EKG shows normal sinus rhythm at 69 with no acute ST or T wave changes.  CT angio of the chest is negative for pulmonary embolism.  ASSESSMENT AND PLAN: 1. Chest pain.  The patient will be admitted to telemetry unit.  We     will cycle her cardiac markers and do a 2D echocardiogram to rule     out any acute coronary syndrome.  We will continue her on aspirin.     We will check a fasting lipid panel as well as a hemoglobin A1c.     With her dizziness, we will monitor her on telemetry and rule out     any underlying cardiac arrhythmias.  With her constellation of     headaches, reported high blood pressure, diaphoresis and anxiety, a     possible consideration could be an underlying pheochromocytoma,     although workup for this can primarily be conducted as an     outpatient.  I have advised her that if her workup is negative, to     pursue this with her primary care doctor. 2. Hypothyroidism.  We will check a TSH as well as continue her     Synthroid. 3. Depression.  We will continue her Cymbalta. 4. Code status.  The patient is a full code.  Further orders per the     clinical course.     Erick Blinks, MD     JM/MEDQ  D:  05/06/2011  T:  05/06/2011  Job:  [id number]  Electronically Signed by Durward Mallard MEMON  on 05/14/2011 07:47:50 PM

## 2011-06-07 DIAGNOSIS — I82409 Acute embolism and thrombosis of unspecified deep veins of unspecified lower extremity: Secondary | ICD-10-CM | POA: Insufficient documentation

## 2011-06-07 DIAGNOSIS — F411 Generalized anxiety disorder: Secondary | ICD-10-CM | POA: Insufficient documentation

## 2011-06-07 DIAGNOSIS — J309 Allergic rhinitis, unspecified: Secondary | ICD-10-CM | POA: Insufficient documentation

## 2011-06-07 DIAGNOSIS — K760 Fatty (change of) liver, not elsewhere classified: Secondary | ICD-10-CM | POA: Insufficient documentation

## 2012-02-22 ENCOUNTER — Emergency Department (HOSPITAL_COMMUNITY)
Admission: EM | Admit: 2012-02-22 | Discharge: 2012-02-22 | Disposition: A | Discharge status: Home / Self Care | Payer: Medicare Other | Attending: Emergency Medicine | Admitting: Emergency Medicine

## 2012-02-22 DIAGNOSIS — R251 Tremor, unspecified: Secondary | ICD-10-CM

## 2012-02-22 DIAGNOSIS — R259 Unspecified abnormal involuntary movements: Secondary | ICD-10-CM | POA: Insufficient documentation

## 2012-02-22 LAB — POCT I-STAT, CHEM 8
BUN: 6 mg/dL (ref 6–23)
Calcium, Ion: 1.13 mmol/L (ref 1.12–1.23)
Chloride: 102 mEq/L (ref 96–112)
Creatinine, Ser: 0.9 mg/dL (ref 0.50–1.10)
Glucose, Bld: 97 mg/dL (ref 70–99)
HCT: 49 % — ABNORMAL HIGH (ref 36.0–46.0)
Hemoglobin: 16.7 g/dL — ABNORMAL HIGH (ref 12.0–15.0)
Potassium: 3.9 mEq/L (ref 3.5–5.1)
Sodium: 141 mEq/L (ref 135–145)
TCO2: 24 mmol/L (ref 0–100)

## 2012-02-22 LAB — T4, FREE: Free T4: 0.81 ng/dL (ref 0.80–1.80)

## 2012-02-22 LAB — TSH: TSH: 2.5 u[IU]/mL (ref 0.350–4.500)

## 2012-02-22 MED ORDER — SODIUM CHLORIDE 0.9 % IV BOLUS (SEPSIS)
1000.0000 mL | Freq: Once | INTRAVENOUS | Status: AC
  Administered 2012-02-22: 1000 mL via INTRAVENOUS

## 2012-02-22 NOTE — ED Notes (Signed)
Patient is having nausea and pain in back

## 2012-02-22 NOTE — ED Provider Notes (Signed)
History     CSN: [id number]  Arrival date & time 02/22/12  0306   First MD Initiated Contact with Patient 02/22/12 0410      Chief Complaint  Patient presents with  . Spasms     The history is provided by the patient.   the patient reports several days of what she describes as tremor and also describes as some sense of restlessness in size and inability to stay still.  She's cut a primary care physician for followup and they recommended that she stop her Cymbalta several days ago.  She stopped her Cymbalta but continues to have symptoms at this time.  She reports her appointment with her physicians Dr. several weeks.  There is sensation became worse this evening and thus she tried multiple times at home and she also drank 8 beers to see if this would help.  None of this has helped and therefore she presents for evaluation.  She does report diarrhea for approximately the last week without melena or hematochezia.  She has no chest pain shortness of breath.  She's no abdominal pain.  She denies weakness of her upper lower extremities.  She has no headache.  She does report some nausea earlier today.  No nausea at this time.  No past medical history on file.  No past surgical history on file.  No family history on file.  History  Substance Use Topics  . Smoking status: Not on file  . Smokeless tobacco: Not on file  . Alcohol Use: Not on file    OB History    No data available      Review of Systems  All other systems reviewed and are negative.    Allergies  Celebrex and Codeine  Home Medications   Current Outpatient Rx  Name Route Sig Dispense Refill  . DIAZEPAM 5 MG PO TABS Oral Take 5 mg by mouth every 6 (six) hours as needed. To relax    . LEVOTHYROXINE SODIUM 150 MCG PO TABS Oral Take 150 mcg by mouth daily.    Marland Kitchen LOPERAMIDE HCL 2 MG PO CAPS Oral Take 2 mg by mouth daily as needed. For diarrhea      BP 130/85  Pulse 95  Temp 98.3 F (36.8 C) (Oral)  Resp 18   SpO2 95%  Physical Exam  Nursing note and vitals reviewed. Constitutional: She is oriented to person, place, and time. She appears well-developed and well-nourished. No distress.  HENT:  Head: Normocephalic and atraumatic.  Eyes: EOM are normal.  Neck: Normal range of motion.  Cardiovascular: Normal rate, regular rhythm and normal heart sounds.   Pulmonary/Chest: Effort normal and breath sounds normal.  Abdominal: Soft. She exhibits no distension. There is no tenderness.  Musculoskeletal: Normal range of motion.  Neurological: She is alert and oriented to person, place, and time.       No tremor noted  Skin: Skin is warm and dry.  Psychiatric: She has a normal mood and affect. Judgment normal.    ED Course  Procedures (including critical care time)  Labs Reviewed  POCT I-STAT, CHEM 8 - Abnormal; Notable for the following:    Hemoglobin 16.7 (*)     HCT 49.0 (*)     All other components within normal limits  TSH  T4, FREE   No results found.   1. Tremor of unknown origin       MDM  The patient seems to feel some tremor that I'm unable to visualize.  This may be more active seizures than anything else.  She recent stopped her Cymbalta.  Recommend that she followup with her primary care physician.  Thyroid studies pending to be followed up with PCP.r        Lyanne Co, MD 02/22/12 [phone]

## 2012-02-22 NOTE — ED Notes (Addendum)
ems - 5 days interm. Spasms in hands feet and neck , throughout the 5 days taken valium  that helped the spasm. Worsen today , 8 valium - totaling 40 mg .. 8 beers.Marland Kitchenspasm has not stopped. Very talkataive, c/o back pain - chronic. Nausea,  X 5 days. No sob no neuro. Deficit vss. Hx hypothyroism, djd, etc..

## 2012-10-31 ENCOUNTER — Encounter: Payer: Self-pay | Admitting: Gastroenterology

## 2013-12-11 ENCOUNTER — Other Ambulatory Visit: Payer: Self-pay | Admitting: Internal Medicine

## 2013-12-11 DIAGNOSIS — Z1231 Encounter for screening mammogram for malignant neoplasm of breast: Secondary | ICD-10-CM

## 2013-12-23 ENCOUNTER — Ambulatory Visit: Payer: Medicare Other

## 2014-05-13 ENCOUNTER — Ambulatory Visit: Payer: Medicare Other | Admitting: Psychiatry

## 2014-08-05 DIAGNOSIS — M961 Postlaminectomy syndrome, not elsewhere classified: Secondary | ICD-10-CM | POA: Diagnosis not present

## 2014-08-05 DIAGNOSIS — Z79899 Other long term (current) drug therapy: Secondary | ICD-10-CM | POA: Diagnosis not present

## 2014-08-05 DIAGNOSIS — M545 Low back pain: Secondary | ICD-10-CM | POA: Diagnosis not present

## 2014-08-05 DIAGNOSIS — M5137 Other intervertebral disc degeneration, lumbosacral region: Secondary | ICD-10-CM | POA: Diagnosis not present

## 2014-08-05 DIAGNOSIS — M47817 Spondylosis without myelopathy or radiculopathy, lumbosacral region: Secondary | ICD-10-CM | POA: Diagnosis not present

## 2014-08-05 DIAGNOSIS — G894 Chronic pain syndrome: Secondary | ICD-10-CM | POA: Diagnosis not present

## 2014-10-11 ENCOUNTER — Emergency Department (HOSPITAL_COMMUNITY)
Admission: EM | Admit: 2014-10-11 | Discharge: 2014-10-11 | Disposition: A | Discharge status: Home / Self Care | Payer: Medicare Other | Source: Home / Self Care | Attending: Family Medicine | Admitting: Family Medicine

## 2014-10-11 ENCOUNTER — Encounter (HOSPITAL_COMMUNITY): Payer: Self-pay | Admitting: Emergency Medicine

## 2014-10-11 DIAGNOSIS — L500 Allergic urticaria: Secondary | ICD-10-CM | POA: Diagnosis not present

## 2014-10-11 HISTORY — DX: Disorder of thyroid, unspecified: E07.9

## 2014-10-11 MED ORDER — METHYLPREDNISOLONE SODIUM SUCC 125 MG IJ SOLR
INTRAMUSCULAR | Status: AC
  Filled 2014-10-11: qty 2

## 2014-10-11 MED ORDER — PREDNISONE 10 MG PO TABS: ORAL_TABLET | ORAL | Status: DC

## 2014-10-11 MED ORDER — HYDROXYZINE HCL 25 MG PO TABS: 25.0000 mg | ORAL_TABLET | Freq: Four times a day (QID) | ORAL | Status: DC | PRN

## 2014-10-11 MED ORDER — METHYLPREDNISOLONE SODIUM SUCC 125 MG IJ SOLR
125.0000 mg | Freq: Once | INTRAMUSCULAR | Status: AC
  Administered 2014-10-11: 125 mg via INTRAMUSCULAR

## 2014-10-11 NOTE — ED Provider Notes (Signed)
CSN: [id number]     Arrival date & time 10/11/14  1142 History   First MD Initiated Contact with Patient 10/11/14 1240     Chief Complaint  Patient presents with  . Rash   (Consider location/radiation/quality/duration/timing/severity/associated sxs/prior Treatment) HPI      59 year old female presents for evaluation of a rash. She says that yesterday she started to develop bronchitis with a slight cough. She started taking doxycycline and soon developed a rash. It is very itchy and is on her arms, legs, and trunk. She has had rashes like this in the past responses to other medications. No wheezing or shortness of breath. No swelling of lips, tongue, or throat. No upset stomach and diarrhea.  Past Medical History  Diagnosis Date  . Asthma   . Thyroid disease    Past Surgical History  Procedure Laterality Date  . Back surgery    . Cholecystectomy    . Abdominal hysterectomy    . Knee surgery     No family history on file. History  Substance Use Topics  . Smoking status: Never Smoker   . Smokeless tobacco: Not on file  . Alcohol Use: No   OB History    No data available     Review of Systems  Respiratory: Positive for cough. Negative for shortness of breath and wheezing.   Gastrointestinal: Negative for nausea, vomiting and diarrhea.  Skin: Positive for rash.  All other systems reviewed and are negative.   Allergies  Celebrex; Codeine; and Doxycycline  Home Medications   Prior to Admission medications   Medication Sig Start Date End Date Taking? Authorizing Provider  Albuterol (VENTOLIN IN) Inhale into the lungs.   Yes Historical Provider, MD  ALPRAZolam Duanne Moron) 0.5 MG tablet Take 0.5 mg by mouth at bedtime as needed for anxiety.   Yes Historical Provider, MD  gabapentin (NEURONTIN) 300 MG capsule Take 300 mg by mouth 3 (three) times daily.   Yes Historical Provider, MD  METRONIDAZOLE PO Take by mouth.   Yes Historical Provider, MD  promethazine (PHENERGAN) 25 MG  tablet Take 25 mg by mouth every 6 (six) hours as needed for nausea or vomiting.   Yes Historical Provider, MD  diazepam (VALIUM) 5 MG tablet Take 5 mg by mouth every 6 (six) hours as needed. To relax    Historical Provider, MD  hydrOXYzine (ATARAX/VISTARIL) 25 MG tablet Take 1 tablet (25 mg total) by mouth every 6 (six) hours as needed for itching. 10/11/14   Liam Graham, PA-C  levothyroxine (SYNTHROID, LEVOTHROID) 150 MCG tablet Take 150 mcg by mouth daily.    Historical Provider, MD  loperamide (IMODIUM) 2 MG capsule Take 2 mg by mouth daily as needed. For diarrhea    Historical Provider, MD  predniSONE (DELTASONE) 10 MG tablet 4 tabs PO QD for 4 days; 3 tabs PO QD for 3 days; 2 tabs PO QD for 2 days; 1 tab PO QD for 1 day 10/11/14   Liam Graham, PA-C   BP 149/92 mmHg  Pulse 91  Temp(Src) 97.8 F (36.6 C) (Oral)  Resp 16  SpO2 96% Physical Exam  Constitutional: She is oriented to person, place, and time. Vital signs are normal. She appears well-developed and well-nourished. No distress.  HENT:  Head: Normocephalic and atraumatic.  Cardiovascular: Normal rate, regular rhythm and normal heart sounds.   Pulmonary/Chest: Effort normal and breath sounds normal. No respiratory distress.  Neurological: She is alert and oriented to person, place, and time. She  has normal strength. Coordination normal.  Skin: Skin is warm and dry. Rash noted. Rash is urticarial (urticarial/maculopapular rash on the trunk and extremities, pruritic). She is not diaphoretic.  Psychiatric: She has a normal mood and affect. Judgment normal.  Nursing note and vitals reviewed.   ED Course  Procedures (including critical care time) Labs Review Labs Reviewed - No data to display  Imaging Review No results found.   MDM   1. Allergic urticaria    No signs of anaphylaxis. Stop doxycycline, she will get an injection of Solu-Medrol here and be discharged with prednisone and hydroxyzine. I also encouraged her  to not take antibiotics for cough, unless she develops symptoms of pneumonia or bacterial sinusitis which were reviewed with her.      Liam Graham, PA-C 10/11/14 1324

## 2014-10-11 NOTE — Discharge Instructions (Signed)
Allergies °Allergies may happen from anything your body is sensitive to. This may be food, medicines, pollens, chemicals, and nearly anything around you in everyday life that produces allergens. An allergen is anything that causes an allergy producing substance. Heredity is often a factor in causing these problems. This means you may have some of the same allergies as your parents. °Food allergies happen in all age groups. Food allergies are some of the most severe and life threatening. Some common food allergies are cow's milk, seafood, eggs, nuts, wheat, and soybeans. °SYMPTOMS  °· Swelling around the mouth. °· An itchy red rash or hives. °· Vomiting or diarrhea. °· Difficulty breathing. °SEVERE ALLERGIC REACTIONS ARE LIFE-THREATENING. °This reaction is called anaphylaxis. It can cause the mouth and throat to swell and cause difficulty with breathing and swallowing. In severe reactions only a trace amount of food (for example, peanut oil in a salad) may cause death within seconds. °Seasonal allergies occur in all age groups. These are seasonal because they usually occur during the same season every year. They may be a reaction to molds, grass pollens, or tree pollens. Other causes of problems are house dust mite allergens, pet dander, and mold spores. The symptoms often consist of nasal congestion, a runny itchy nose associated with sneezing, and tearing itchy eyes. There is often an associated itching of the mouth and ears. The problems happen when you come in contact with pollens and other allergens. Allergens are the particles in the air that the body reacts to with an allergic reaction. This causes you to release allergic antibodies. Through a chain of events, these eventually cause you to release histamine into the blood stream. Although it is meant to be protective to the body, it is this release that causes your discomfort. This is why you were given anti-histamines to feel better.  If you are unable to  pinpoint the offending allergen, it may be determined by skin or blood testing. Allergies cannot be cured but can be controlled with medicine. °Hay fever is a collection of all or some of the seasonal allergy problems. It may often be treated with simple over-the-counter medicine such as diphenhydramine. Take medicine as directed. Do not drink alcohol or drive while taking this medicine. Check with your caregiver or package insert for child dosages. °If these medicines are not effective, there are many new medicines your caregiver can prescribe. Stronger medicine such as nasal spray, eye drops, and corticosteroids may be used if the first things you try do not work well. Other treatments such as immunotherapy or desensitizing injections can be used if all else fails. Follow up with your caregiver if problems continue. These seasonal allergies are usually not life threatening. They are generally more of a nuisance that can often be handled using medicine. °HOME CARE INSTRUCTIONS  °· If unsure what causes a reaction, keep a diary of foods eaten and symptoms that follow. Avoid foods that cause reactions. °· If hives or rash are present: °· Take medicine as directed. °· You may use an over-the-counter antihistamine (diphenhydramine) for hives and itching as needed. °· Apply cold compresses (cloths) to the skin or take baths in cool water. Avoid hot baths or showers. Heat will make a rash and itching worse. °· If you are severely allergic: °· Following a treatment for a severe reaction, hospitalization is often required for closer follow-up. °· Wear a medic-alert bracelet or necklace stating the allergy. °· You and your family must learn how to give adrenaline or use   an anaphylaxis kit.  If you have had a severe reaction, always carry your anaphylaxis kit or EpiPen with you. Use this medicine as directed by your caregiver if a severe reaction is occurring. Failure to do so could have a fatal outcome. SEEK MEDICAL  CARE IF:  You suspect a food allergy. Symptoms generally happen within 30 minutes of eating a food.  Your symptoms have not gone away within 2 days or are getting worse.  You develop new symptoms.  You want to retest yourself or your child with a food or drink you think causes an allergic reaction. Never do this if an anaphylactic reaction to that food or drink has happened before. Only do this under the care of a caregiver. SEEK IMMEDIATE MEDICAL CARE IF:   You have difficulty breathing, are wheezing, or have a tight feeling in your chest or throat.  You have a swollen mouth, or you have hives, swelling, or itching all over your body.  You have had a severe reaction that has responded to your anaphylaxis kit or an EpiPen. These reactions may return when the medicine has worn off. These reactions should be considered life threatening. MAKE SURE YOU:   Understand these instructions.  Will watch your condition.  Will get help right away if you are not doing well or get worse. Document Released: 10/04/2002 Document Revised: 11/05/2012 Document Reviewed: 03/10/2008 [facility] Patient Information 2015 [place], [place]. This information is not intended to replace advice given to you by your health care provider. Make sure you discuss any questions you have with your health care provider.  Drug Allergy Allergic reactions to medicines are common. Some allergic reactions are mild. A delayed type of drug allergy that occurs 1 week or more after exposure to a medicine or vaccine is called serum sickness. A life-threatening, sudden (acute) allergic reaction that involves the whole body is called anaphylaxis. CAUSES  "True" drug allergies occur when there is an allergic reaction to a medicine. This is caused by overactivity of the immune system. First, the body becomes sensitized. The immune system is triggered by your first exposure to the medicine. Following this first exposure, future exposure to  the same medicine may be life-threatening. Almost any medicine can cause an allergic reaction. Common ones are:  Penicillin.  Sulfonamides (sulfa drugs).  Local anesthetics.  X-ray dyes that contain iodine. SYMPTOMS  Common symptoms of a minor allergic reaction are:  Swelling around the mouth.  An itchy red rash or hives.  Vomiting or diarrhea. Anaphylaxis can cause swelling of the mouth and throat. This makes it difficult to breathe and swallow. Severe reactions can be fatal within seconds, even after exposure to only a trace amount of the drug that causes the reaction. HOME CARE INSTRUCTIONS   If you are unsure of what caused your reaction, keep a diary of foods and medicines used. Include the symptoms that followed. Avoid anything that causes reactions.  You may want to follow up with an allergy specialist after the reaction has cleared in order to be tested to confirm the allergy. It is important to confirm that your reaction is an allergy, not just a side effect to the medicine. If you have a true allergy to a medicine, this may prevent that medicine and related medicines from being given to you when you are very ill.  If you have hives or a rash:  Take medicines as directed by your caregiver.  You may use an over-the-counter antihistamine (diphenhydramine) as needed.  Apply cold compresses to the skin or take baths in cool water. Avoid hot baths or showers.  If you are severely allergic:  Continuous observation after a severe reaction may be needed. Hospitalization is often required.  Wear a medical alert bracelet or necklace stating your allergy.  You and your family must learn how to use an anaphylaxis kit or give an epinephrine injection to temporarily treat an emergency allergic reaction. If you have had a severe reaction, always carry your epinephrine injection or anaphylaxis kit with you. This can be lifesaving if you have a severe reaction.  Do not drive or  perform tasks after treatment until the medicines used to treat your reaction have worn off, or until your caregiver says it is okay. SEEK MEDICAL CARE IF:   You think you had an allergic reaction. Symptoms usually start within 30 minutes after exposure.  Symptoms are getting worse rather than better.  You develop new symptoms.  The symptoms that brought you to your caregiver return. SEEK IMMEDIATE MEDICAL CARE IF:   You have swelling of the mouth, difficulty breathing, or wheezing.  You have a tight feeling in your chest or throat.  You develop hives, swelling, or itching all over your body.  You develop severe vomiting or diarrhea.  You feel faint or pass out. This is an emergency. Use your epinephrine injection or anaphylaxis kit as you have been instructed. Call for emergency medical help. Even if you improve after the injection, you need to be examined at a hospital emergency department. MAKE SURE YOU:   Understand these instructions.  Will watch your condition.  Will get help right away if you are not doing well or get worse. Document Released: 07/11/2005 Document Revised: 10/03/2011 Document Reviewed: 12/15/2010 [facility] Patient Information 2015 [place], [place]. This information is not intended to replace advice given to you by your health care provider. Make sure you discuss any questions you have with your health care provider.  Hives Hives are itchy, red, swollen areas of the skin. They can vary in size and location on your body. Hives can come and go for hours or several days (acute hives) or for several weeks (chronic hives). Hives do not spread from person to person (noncontagious). They may get worse with scratching, exercise, and emotional stress. CAUSES   Allergic reaction to food, additives, or drugs.  Infections, including the common cold.  Illness, such as vasculitis, lupus, or thyroid disease.  Exposure to sunlight, heat, or  cold.  Exercise.  Stress.  Contact with chemicals. SYMPTOMS   Red or white swollen patches on the skin. The patches may change size, shape, and location quickly and repeatedly.  Itching.  Swelling of the hands, feet, and face. This may occur if hives develop deeper in the skin. DIAGNOSIS  Your caregiver can usually tell what is wrong by performing a physical exam. Skin or blood tests may also be done to determine the cause of your hives. In some cases, the cause cannot be determined. TREATMENT  Mild cases usually get better with medicines such as antihistamines. Severe cases may require an emergency epinephrine injection. If the cause of your hives is known, treatment includes avoiding that trigger.  HOME CARE INSTRUCTIONS   Avoid causes that trigger your hives.  Take antihistamines as directed by your caregiver to reduce the severity of your hives. Non-sedating or low-sedating antihistamines are usually recommended. Do not drive while taking an antihistamine.  Take any other medicines prescribed for itching as directed by  your caregiver.  Wear loose-fitting clothing.  Keep all follow-up appointments as directed by your caregiver. SEEK MEDICAL CARE IF:   You have persistent or severe itching that is not relieved with medicine.  You have painful or swollen joints. SEEK IMMEDIATE MEDICAL CARE IF:   You have a fever.  Your tongue or lips are swollen.  You have trouble breathing or swallowing.  You feel tightness in the throat or chest.  You have abdominal pain. These problems may be the first sign of a life-threatening allergic reaction. Call your local emergency services (911 in [place] MAKE SURE YOU:   Understand these instructions.  Will watch your condition.  Will get help right away if you are not doing well or get worse. Document Released: 07/11/2005 Document Revised: 07/16/2013 Document Reviewed: 10/04/2011 [facility] Patient Information 2015 [place], [place].  This information is not intended to replace advice given to you by your health care provider. Make sure you discuss any questions you have with your health care provider.

## 2014-11-25 ENCOUNTER — Ambulatory Visit: Payer: Medicare Other

## 2014-12-02 ENCOUNTER — Ambulatory Visit: Payer: Medicare Other

## 2015-01-22 ENCOUNTER — Encounter (HOSPITAL_COMMUNITY): Payer: Self-pay | Admitting: Emergency Medicine

## 2015-01-22 ENCOUNTER — Emergency Department (HOSPITAL_COMMUNITY)
Admission: EM | Admit: 2015-01-22 | Discharge: 2015-01-22 | Disposition: A | Discharge status: Home / Self Care | Payer: Medicare Other | Source: Home / Self Care | Attending: Family Medicine | Admitting: Family Medicine

## 2015-01-22 DIAGNOSIS — R55 Syncope and collapse: Secondary | ICD-10-CM

## 2015-01-22 DIAGNOSIS — K645 Perianal venous thrombosis: Secondary | ICD-10-CM

## 2015-01-22 MED ORDER — HYDROCORTISONE 2.5 % RE CREA: 1.0000 "application " | TOPICAL_CREAM | Freq: Two times a day (BID) | RECTAL | Status: DC

## 2015-01-22 NOTE — Discharge Instructions (Signed)
You're suffering from an external hemorrhoid. Please use the Anusol cream and continue with the Tucks pad for additional relief. This will take some time to completely resolve. In the future these can only be excised if you are evaluated by Dr. within roughly 24 hours of 1 occurred. Episode of passing out state is likely what is called a vasovagal response. There is no immediate evidence of this being related to your heart, or your brain. Please go to the emergency room if you have another event as you need more thorough workup.

## 2015-01-22 NOTE — ED Provider Notes (Signed)
CSN: [id number]     Arrival date & time 01/22/15  1513 History   First MD Initiated Contact with Patient 01/22/15 1619     Chief Complaint  Patient presents with  . Mass    "lump on butt"  . Loss of Consciousness   (Consider location/radiation/quality/duration/timing/severity/associated sxs/prior Treatment) HPI  3 days ago pt developed protrusion from her bottom. Tried Tucks pads w/o benefit. Painful. Came on after BM.   Patient reports single episode of syncope versus near syncope earlier today. Patient states that she is taking a hot shower when she started to feel headed and like she is going to pass out. She got out of the shower and started to sit down when she completely lost consciousness, or she states she thinks she lost consciousness. Patient fell onto a memory foam carpeted floor. Denies any head trauma. Denies any loss of bowel or bladder function, headache, shortness of breath, chest pain, palpitations, vertigo, nausea, vomiting. Patient felt somewhat weak after the episode but has had no neurologic or other deficits. Denies any change in medications recently. She states that she has been a severe pain recently from her hemorrhoid.    Past Medical History  Diagnosis Date  . Asthma   . Thyroid disease    Past Surgical History  Procedure Laterality Date  . Back surgery    . Cholecystectomy    . Abdominal hysterectomy    . Knee surgery     Family History  Problem Relation Age of Onset  . Hypertension Mother   . Heart attack Father 110   History  Substance Use Topics  . Smoking status: Never Smoker   . Smokeless tobacco: Not on file  . Alcohol Use: No   OB History    No data available     Review of Systems Per HPI with all other pertinent systems negative.   Allergies  Celebrex; Codeine; and Doxycycline  Home Medications   Prior to Admission medications   Medication Sig Start Date End Date Taking? Authorizing Provider  Albuterol (VENTOLIN IN) Inhale into  the lungs.   Yes Historical Provider, MD  ALPRAZolam Duanne Moron) 0.5 MG tablet Take 0.5 mg by mouth at bedtime as needed for anxiety.   Yes Historical Provider, MD  levothyroxine (SYNTHROID, LEVOTHROID) 150 MCG tablet Take 150 mcg by mouth daily.   Yes Historical Provider, MD  hydrocortisone (ANUSOL-HC) 2.5 % rectal cream Place 1 application rectally 2 (two) times daily. 01/22/15   Waldemar Dickens, MD   BP 137/87 mmHg  Pulse 97  Temp(Src) 97.2 F (36.2 C) (Oral)  Resp 16  SpO2 95% Physical Exam Physical Exam  Constitutional: oriented to person, place, and time. appears well-developed and well-nourished. No distress.  HENT:  Head: Normocephalic and atraumatic.  Eyes: EOMI. PERRL.  Neck: Normal range of motion.  Cardiovascular: RRR, no m/r/g, 2+ distal pulses,  Pulmonary/Chest: Effort normal and breath sounds normal. No respiratory distress.  Abdominal: Soft. Bowel sounds are normal. NonTTP, no distension.  Musculoskeletal: Normal range of motion. Non ttp, no effusion.  Neurological: alert and oriented to person, place, and time. C through 12 grossly intact, moves all extremities and coordinated fashion fashion, gait normal, able to sit unassisted.  Skin: Skin is warm. No rash noted. non diaphoretic.  Psychiatric: normal mood and affect. behavior is normal. Judgment and thought content normal.  GU: Single external thrombosed hemorrhoid, tender to palpation.   ED Course  Procedures (including critical care time) Labs Review Labs Reviewed - No  data to display  Imaging Review No results found.   MDM   1. Thrombosed external hemorrhoid   2. Syncope, unspecified syncope type     Thrombosed external hemorrhoid. Occurred 3 days ago. The benefit of excision of this point time. Anusol and Tucks pads. Discussed management in the future if these recur. Sunday patient complaining of syncopal versus near-syncopal episode today. EKG normal. No evidence of arrhythmia, seizure, medication induced  episode based on description of patient's symptoms. Suspect vasovagal episode as patient was in a hot shower and still struggling with the pain of her external hemorrhoid. Patient counseled to go to the emergency room she feels that the symptoms return or she develops any secondary symptoms such as palpitations chest pain shortness of breath nausea vomiting severe headache.    Waldemar Dickens, MD 01/22/15 [phone]

## 2015-03-09 ENCOUNTER — Ambulatory Visit: Payer: Self-pay | Admitting: Orthopedic Surgery

## 2015-03-19 ENCOUNTER — Encounter (HOSPITAL_COMMUNITY)
Admission: RE | Admit: 2015-03-19 | Discharge: 2015-03-19 | Disposition: A | Discharge status: Home / Self Care | Payer: Medicare Other | Source: Ambulatory Visit | Attending: Specialist | Admitting: Specialist

## 2015-03-19 ENCOUNTER — Ambulatory Visit (HOSPITAL_COMMUNITY)
Admission: RE | Admit: 2015-03-19 | Discharge: 2015-03-19 | Disposition: A | Discharge status: Home / Self Care | Payer: Medicare Other | Source: Ambulatory Visit | Attending: Orthopedic Surgery | Admitting: Orthopedic Surgery

## 2015-03-19 ENCOUNTER — Encounter (HOSPITAL_COMMUNITY): Payer: Self-pay

## 2015-03-19 DIAGNOSIS — Z0183 Encounter for blood typing: Secondary | ICD-10-CM | POA: Diagnosis not present

## 2015-03-19 DIAGNOSIS — Z01818 Encounter for other preprocedural examination: Secondary | ICD-10-CM | POA: Diagnosis present

## 2015-03-19 DIAGNOSIS — Z01812 Encounter for preprocedural laboratory examination: Secondary | ICD-10-CM | POA: Insufficient documentation

## 2015-03-19 DIAGNOSIS — M1711 Unilateral primary osteoarthritis, right knee: Secondary | ICD-10-CM | POA: Diagnosis not present

## 2015-03-19 HISTORY — DX: Personal history of urinary (tract) infections: Z87.440

## 2015-03-19 HISTORY — DX: Tinnitus, unspecified ear: H93.19

## 2015-03-19 HISTORY — DX: Unspecified hemorrhoids: K64.9

## 2015-03-19 HISTORY — DX: Secondary polycythemia: D75.1

## 2015-03-19 HISTORY — DX: Family history of other specified conditions: Z84.89

## 2015-03-19 HISTORY — DX: Other complications of anesthesia, initial encounter: T88.59XA

## 2015-03-19 HISTORY — DX: Unspecified osteoarthritis, unspecified site: M19.90

## 2015-03-19 HISTORY — DX: Anxiety disorder, unspecified: F41.9

## 2015-03-19 HISTORY — DX: Personal history of other diseases of the respiratory system: Z87.09

## 2015-03-19 HISTORY — DX: Other fatigue: R53.83

## 2015-03-19 HISTORY — DX: Personal history of urinary calculi: Z87.442

## 2015-03-19 HISTORY — DX: Generalized hyperhidrosis: R61

## 2015-03-19 HISTORY — DX: Personal history of other medical treatment: Z92.89

## 2015-03-19 HISTORY — DX: Other specified postprocedural states: Z98.890

## 2015-03-19 HISTORY — DX: Pneumonia, unspecified organism: J18.9

## 2015-03-19 HISTORY — DX: Personal history of other diseases of the digestive system: Z87.19

## 2015-03-19 HISTORY — DX: Personal history of other infectious and parasitic diseases: Z86.19

## 2015-03-19 HISTORY — DX: Insomnia, unspecified: G47.00

## 2015-03-19 HISTORY — DX: Repeated falls: R29.6

## 2015-03-19 HISTORY — DX: Other abnormalities of gait and mobility: R26.89

## 2015-03-19 HISTORY — DX: Personal history of other specified conditions: Z87.898

## 2015-03-19 HISTORY — DX: Personal history of other venous thrombosis and embolism: Z86.718

## 2015-03-19 HISTORY — DX: Adverse effect of unspecified anesthetic, initial encounter: T41.45XA

## 2015-03-19 HISTORY — DX: Unspecified fall, initial encounter: W19.XXXA

## 2015-03-19 HISTORY — DX: Hypothyroidism, unspecified: E03.9

## 2015-03-19 HISTORY — DX: Other specified postprocedural states: R11.2

## 2015-03-19 LAB — CBC
HCT: 46.4 % — ABNORMAL HIGH (ref 36.0–46.0)
Hemoglobin: 15 g/dL (ref 12.0–15.0)
MCH: 29.2 pg (ref 26.0–34.0)
MCHC: 32.3 g/dL (ref 30.0–36.0)
MCV: 90.4 fL (ref 78.0–100.0)
Platelets: 393 10*3/uL (ref 150–400)
RBC: 5.13 MIL/uL — ABNORMAL HIGH (ref 3.87–5.11)
RDW: 13.9 % (ref 11.5–15.5)
WBC: 11.7 10*3/uL — ABNORMAL HIGH (ref 4.0–10.5)

## 2015-03-19 LAB — COMPREHENSIVE METABOLIC PANEL
ALT: 20 U/L (ref 14–54)
AST: 22 U/L (ref 15–41)
Albumin: 4.4 g/dL (ref 3.5–5.0)
Alkaline Phosphatase: 126 U/L (ref 38–126)
Anion gap: 9 (ref 5–15)
BUN: 18 mg/dL (ref 6–20)
CO2: 28 mmol/L (ref 22–32)
Calcium: 9.7 mg/dL (ref 8.9–10.3)
Chloride: 104 mmol/L (ref 101–111)
Creatinine, Ser: 0.8 mg/dL (ref 0.44–1.00)
GFR calc Af Amer: >60 mL/min (ref >60)
GFR calc non Af Amer: >60 mL/min (ref >60)
Glucose, Bld: 102 mg/dL — ABNORMAL HIGH (ref 65–99)
Potassium: 4.7 mmol/L (ref 3.5–5.1)
Sodium: 141 mmol/L (ref 135–145)
Total Bilirubin: 0.6 mg/dL (ref 0.3–1.2)
Total Protein: 8 g/dL (ref 6.5–8.1)

## 2015-03-19 LAB — PROTIME-INR
INR: 0.96 (ref 0.00–1.49)
Prothrombin Time: 12.9 seconds (ref 11.6–15.2)

## 2015-03-19 LAB — ABO/RH: ABO/RH(D): O POS

## 2015-03-19 LAB — APTT: aPTT: 31 seconds (ref 24–37)

## 2015-03-19 LAB — SURGICAL PCR SCREEN
MRSA, PCR: NEGATIVE
Staphylococcus aureus: POSITIVE — AB

## 2015-03-19 NOTE — Progress Notes (Signed)
EKG per epic 01/22/2015 ECHO per epic 11/07/2006

## 2015-03-19 NOTE — Patient Instructions (Signed)
Carol [name]  03/19/2015   Your procedure is scheduled on: Thursday March 26, 2015  Report to [facility] Main  Entrance take [place]  elevators to 3rd floor to  [place] at 5:30 AM.  Call this number if you have problems the morning of surgery [phone]   Remember: ONLY 1 PERSON MAY GO WITH YOU TO SHORT STAY TO GET  READY MORNING OF [place].  Do not eat food or drink liquids :After Midnight.     Take these medicines the morning of surgery with A SIP OF WATER: Levothyroxine; Oxycodone if needed; May use Albuterol Inhaler if needed (bring with you day of surgery)                               You may not have any metal on your body including hair pins and              piercings  Do not wear jewelry, make-up, lotions, powders or perfumes, deodorant             Do not wear nail polish.  Do not shave  48 hours prior to surgery.                Do not bring valuables to the hospital. [place].  Contacts, dentures or bridgework may not be worn into surgery.  Leave suitcase in the car. After surgery it may be brought to your room.              Please read over the following fact sheets you were given:MRSA INFORMATION SHEET; INCENTIVE SPIROMETER; BLOOD TRANSFUSION INFORMATION SHEET _____________________________________________________________________             [facility] - Preparing for Surgery Before surgery, you can play an important role.  Because skin is not sterile, your skin needs to be as free of germs as possible.  You can reduce the number of germs on your skin by washing with CHG (chlorahexidine gluconate) soap before surgery.  CHG is an antiseptic cleaner which kills germs and bonds with the skin to continue killing germs even after washing. Please DO NOT use if you have an allergy to CHG or antibacterial soaps.  If your skin becomes reddened/irritated stop using the CHG and inform your nurse  when you arrive at Short Stay. Do not shave (including legs and underarms) for at least 48 hours prior to the first CHG shower.  You may shave your face/neck. Please follow these instructions carefully:  1.  Shower with CHG Soap the night before surgery and the  morning of Surgery.  2.  If you choose to wash your hair, wash your hair first as usual with your  normal  shampoo.  3.  After you shampoo, rinse your hair and body thoroughly to remove the  shampoo.                           4.  Use CHG as you would any other liquid soap.  You can apply chg directly  to the skin and wash                       Gently  with a scrungie or clean washcloth.  5.  Apply the CHG Soap to your body ONLY FROM THE NECK DOWN.   Do not use on face/ open                           Wound or open sores. Avoid contact with eyes, ears mouth and genitals (private parts).                       Wash face,  Genitals (private parts) with your normal soap.             6.  Wash thoroughly, paying special attention to the area where your surgery  will be performed.  7.  Thoroughly rinse your body with warm water from the neck down.  8.  DO NOT shower/wash with your normal soap after using and rinsing off  the CHG Soap.                9.  Pat yourself dry with a clean towel.            10.  Wear clean pajamas.            11.  Place clean sheets on your bed the night of your first shower and do not  sleep with pets. Day of Surgery : Do not apply any lotions/deodorants the morning of surgery.  Please wear clean clothes to the hospital/surgery center.  FAILURE TO FOLLOW THESE INSTRUCTIONS MAY RESULT IN THE CANCELLATION OF YOUR SURGERY PATIENT SIGNATURE_________________________________  NURSE SIGNATURE__________________________________  ________________________________________________________________________   Carol [name]  An incentive spirometer is a tool that can help keep your lungs clear and active. This tool  measures how well you are filling your lungs with each breath. Taking long deep breaths may help reverse or decrease the chance of developing breathing (pulmonary) problems (especially infection) following:  A long period of time when you are unable to move or be active. BEFORE THE PROCEDURE   If the spirometer includes an indicator to show your best effort, your nurse or respiratory therapist will set it to a desired goal.  If possible, sit up straight or lean slightly forward. Try not to slouch.  Hold the incentive spirometer in an upright position. INSTRUCTIONS FOR USE   Sit on the edge of your bed if possible, or sit up as far as you can in bed or on a chair.  Hold the incentive spirometer in an upright position.  Breathe out normally.  Place the mouthpiece in your mouth and seal your lips tightly around it.  Breathe in slowly and as deeply as possible, raising the piston or the ball toward the top of the column.  Hold your breath for 3-5 seconds or for as long as possible. Allow the piston or ball to fall to the bottom of the column.  Remove the mouthpiece from your mouth and breathe out normally.  Rest for a few seconds and repeat Steps 1 through 7 at least 10 times every 1-2 hours when you are awake. Take your time and take a few normal breaths between deep breaths.  The spirometer may include an indicator to show your best effort. Use the indicator as a goal to work toward during each repetition.  After each set of 10 deep breaths, practice coughing to be sure your lungs are clear. If you have an incision (the cut made at the time of surgery), support  your incision when coughing by placing a pillow or rolled up towels firmly against it. Once you are able to get out of bed, walk around indoors and cough well. You may stop using the incentive spirometer when instructed by your caregiver.  RISKS AND COMPLICATIONS  Take your time so you do not get dizzy or light-headed.  If  you are in pain, you may need to take or ask for pain medication before doing incentive spirometry. It is harder to take a deep breath if you are having pain. AFTER USE  Rest and breathe slowly and easily.  It can be helpful to keep track of a log of your progress. Your caregiver can provide you with a simple table to help with this. If you are using the spirometer at home, follow these instructions: [place] IF:   You are having difficultly using the spirometer.  You have trouble using the spirometer as often as instructed.  Your pain medication is not giving enough relief while using the spirometer.  You develop fever of 100.5 F (38.1 C) or higher. SEEK IMMEDIATE MEDICAL CARE IF:   You cough up bloody sputum that had not been present before.  You develop fever of 102 F (38.9 C) or greater.  You develop worsening pain at or near the incision site. MAKE SURE YOU:   Understand these instructions.  Will watch your condition.  Will get help right away if you are not doing well or get worse. Document Released: 11/21/2006 Document Revised: 10/03/2011 Document Reviewed: 01/22/2007 ExitCare Patient Information 2014 ExitCare, [place].   ________________________________________________________________________  WHAT IS A BLOOD TRANSFUSION? Blood Transfusion Information  A transfusion is the replacement of blood or some of its parts. Blood is made up of multiple cells which provide different functions.  Red blood cells carry oxygen and are used for blood loss replacement.  White blood cells fight against infection.  Platelets control bleeding.  Plasma helps clot blood.  Other blood products are available for specialized needs, such as hemophilia or other clotting disorders. BEFORE THE TRANSFUSION  Who gives blood for transfusions?   Healthy volunteers who are fully evaluated to make sure their blood is safe. This is blood bank blood. Transfusion therapy is the  safest it has ever been in the practice of medicine. Before blood is taken from a donor, a complete history is taken to make sure that person has no history of diseases nor engages in risky social behavior (examples are intravenous drug use or sexual activity with multiple partners). The donor's travel history is screened to minimize risk of transmitting infections, such as malaria. The donated blood is tested for signs of infectious diseases, such as HIV and hepatitis. The blood is then tested to be sure it is compatible with you in order to minimize the chance of a transfusion reaction. If you or a relative donates blood, this is often done in anticipation of surgery and is not appropriate for emergency situations. It takes many days to process the donated blood. RISKS AND COMPLICATIONS Although transfusion therapy is very safe and saves many lives, the main dangers of transfusion include:   Getting an infectious disease.  Developing a transfusion reaction. This is an allergic reaction to something in the blood you were given. Every precaution is taken to prevent this. The decision to have a blood transfusion has been considered carefully by your caregiver before blood is given. Blood is not given unless the benefits outweigh the risks. AFTER THE TRANSFUSION  Right after receiving a blood transfusion, you will usually feel much better and more energetic. This is especially true if your red blood cells have gotten low (anemic). The transfusion raises the level of the red blood cells which carry oxygen, and this usually causes an energy increase.  The nurse administering the transfusion will monitor you carefully for complications. HOME CARE INSTRUCTIONS  No special instructions are needed after a transfusion. You may find your energy is better. Speak with your caregiver about any limitations on activity for underlying diseases you may have. SEEK MEDICAL CARE IF:   Your condition is not improving  after your transfusion.  You develop redness or irritation at the intravenous (IV) site. SEEK IMMEDIATE MEDICAL CARE IF:  Any of the following symptoms occur over the next 12 hours:  Shaking chills.  You have a temperature by mouth above 102 F (38.9 C), not controlled by medicine.  Chest, back, or muscle pain.  People around you feel you are not acting correctly or are confused.  Shortness of breath or difficulty breathing.  Dizziness and fainting.  You get a rash or develop hives.  You have a decrease in urine output.  Your urine turns a dark color or changes to pink, red, or brown. Any of the following symptoms occur over the next 10 days:  You have a temperature by mouth above 102 F (38.9 C), not controlled by medicine.  Shortness of breath.  Weakness after normal activity.  The white part of the eye turns yellow (jaundice).  You have a decrease in the amount of urine or are urinating less often.  Your urine turns a dark color or changes to pink, red, or brown. Document Released: 07/08/2000 Document Revised: 10/03/2011 Document Reviewed: 02/25/2008 [facility] Patient Information 2014 [place], [place].  _______________________________________________________________________

## 2015-03-20 NOTE — Progress Notes (Signed)
Surgical screening results in epic per PAT visit 03/19/2015 positive for Staph. Results sent to Dr Tonita Cong. Prescription for Mupriocin Ointment called to [facility] spoke with Amber/pharmacy tech. Pt notified; reviewed instruction.

## 2015-03-24 ENCOUNTER — Ambulatory Visit: Payer: Self-pay | Admitting: Orthopedic Surgery

## 2015-03-24 NOTE — H&P (Signed)
Carol [name] DOB: 04-May-1956 Married / Language: English / Race: White Female  H&P Date 03/23/15  Chief Complaint: Right knee pain  History of Present Illness The patient is a 59 year old female who comes in today for a preoperative History and Physical. The patient is scheduled for a right total knee arthroplasty to be performed by Dr. Johnn Hai, MD at [facility] on April 03, 2015. Carol [name] reports severe R knee pain for years, prior hx of arthroscopy, pain progressively worsening and refractory to steroid injections, viscosupplementation, bracing, activity modifications, home exercise program, pain medications, NSAIDs. Pain is interfering with ADLs at this point and she desires to proceed with surgery. She's starting taking oxycodone again since her last visit for the knee pain, it's making her itchy and she states she can't take benadryl.  Dr. Tonita Cong and the patient mutually agreed to proceed with a total knee replacement. Risks and benefits of the procedure were discussed including stiffness, suboptimal range of motion, persistent pain, infection requiring removal of prosthesis and reinsertion, need for prophylactic antibiotics in the future, for example, dental procedures, possible need for manipulation, revision in the future and also anesthetic complications including DVT, PE, etc. We discussed the perioperative course, time in the hospital, postoperative recovery and the need for elevation to control swelling. We also discussed the predicted range of motion and the probability that squatting and kneeling would be unobtainable in the future. In addition, postoperative anticoagulation was discussed. We have obtained preoperative medical clearance as necessary. Provided her illustrated handout and discussed it in detail. They will enroll in the total joint replacement educational forum at the hospital.  Allergies Ultram *ANALGESICS - OPIOID* Penicillin G Sodium  *PENICILLINS* Cymbalta *ANTIDEPRESSANTS* Neurontin *ANTICONVULSANTS* Ibuprofen *ANALGESICS - ANTI-INFLAMMATORY* Codeine Sulfate *ANALGESICS - OPIOID* Betadine *ANTISEPTICS & DISINFECTANTS*  Family History Heart Disease father Hypertension mother and grandmother mothers side Osteoarthritis mother and grandmother mothers side Diabetes Mellitus grandmother mothers side Cerebrovascular Accident grandmother mothers side Chronic Obstructive Lung Disease mother Congestive Heart Failure father First Degree Relatives  Social History  Current work status disabled Drug/Alcohol Rehab (Currently) no Pain Contract yes Tobacco / smoke exposure yes outdoors only Drug/Alcohol Rehab (Previously) no Marital status married Alcohol use current drinker; drinks beer Children 2 No alcohol use Number of flights of stairs before winded greater than 5 Illicit drug use no Living situation live with spouse Exercise Exercises never; does other Tobacco use Never smoker. never smoker Advance Directives none Post-Surgical Plans home with son as caregiver, lives on 1st level with 3 steps to enter  Medication History OxyCODONE HCl (10MG  Tablet, Oral) Active. Levothyroxine Sodium (200MCG Tablet, Oral) Active. ALPRAZolam (0.5MG  Tablet, Oral) Active. Promethazine HCl (Oral) Specific dose unknown - Active. Ventolin (Inhalation) Specific dose unknown - Active. Medications Reconciled  Pregnancy / Birth History Pregnant no  Past Surgical History  Hysterectomy partial (non-cancerous) Arthroscopy of Knee bilateral Colon Polyp Removal - Colonoscopy Appendectomy Spinal Surgery x4 Spinal Fusion lower back Gallbladder Surgery open Foot Surgery left Tonsillectomy Other Surgery bladder repair  Past Medical History Chronic Pain Hypothyroidism Anxiety Disorder Asthma Bronchitis past Pneumonia past Hemorrhoids Gallbladder Problems Urinary Tract  Infection past Kidney Stone Degenerative Disc Disease Measles Mumps  Review of Systems General Present- Fatigue and Night Sweats. Not Present- Chills, Fever, Memory Loss, Weight Gain and Weight Loss. Skin Present- Hives, Itching and Rash. Not Present- Eczema and Lesions. HEENT Present- Tinnitus. Not Present- Dentures, Double Vision, Headache, Hearing Loss and Visual Loss. Respiratory Present- Allergies, Cough,  Shortness of breath with exertion and Wheezing. Not Present- Chronic Cough, Coughing up blood and Shortness of breath at rest. Cardiovascular Not Present- Chest Pain, Difficulty Breathing Lying Down, Murmur, Palpitations, Racing/skipping heartbeats and Swelling. Gastrointestinal Present- Diarrhea, Jaundice and Nausea. Not Present- Abdominal Pain, Bloody Stool, Constipation, Difficulty Swallowing, Heartburn, Loss of appetitie and Vomiting. Female Genitourinary Present- Painful Urination and Urinating at Night. Not Present- Blood in Urine, Discharge, Flank Pain, Incontinence, Urgency, Urinary frequency, Urinary Retention and Weak urinary stream. Musculoskeletal Present- Back Pain, Joint Pain, Joint Swelling, Morning Stiffness, Muscle Pain, Muscle Weakness and Spasms. Neurological Not Present- Blackout spells, Difficulty with balance, Dizziness, Paralysis, Tremor and Weakness. Psychiatric Present- Insomnia.  Physical Exam General Mental Status -Alert, cooperative and good historian. General Appearance-pleasant, Not in acute distress. Orientation-Oriented X3. Build & Nutrition-Well nourished and Well developed.  Head and Neck Head-normocephalic, atraumatic . Neck Global Assessment - supple, no bruit auscultated on the right, no bruit auscultated on the left.  Eye Pupil - Bilateral-Regular and Round. Motion - Bilateral-EOMI.  Chest and Lung Exam Auscultation Breath sounds - clear at anterior chest wall and clear at posterior chest wall. Adventitious sounds - No  Adventitious sounds.  Cardiovascular Auscultation Rhythm - Regular rate and rhythm. Heart Sounds - S1 WNL and S2 WNL. Murmurs & Other Heart Sounds - Auscultation of the heart reveals - No Murmurs.  Abdomen Palpation/Percussion Tenderness - Abdomen is non-tender to palpation. Rigidity (guarding) - Abdomen is soft. Auscultation Auscultation of the abdomen reveals - Bowel sounds normal.  Female Genitourinary Not done, not pertinent to present illness  Musculoskeletal Right Knee: Inspection and Palpation - Tenderness - medial joint line tender to palpation and lateral joint line tender to palpation, no tenderness to palpation of the superior calf, no tenderness to palpation of the quadriceps tendon, no tenderness to palpation of the patellar tendon, no tenderness to palpation of the patella, no tenderness to palpation of the fibular head, no tenderness to palpation of the peroneal nerve. Patellar Tendon - no pain to palpation of the patellar tendon. Swelling - periarticular swelling present. Effusion - trace. Tissue tension/texture is - soft. Crepitus - moderate patellofemoral crepitus. Pulses - 2+. Sensation - intact to light touch. Skin - Color - no ecchymosis, no erythema. Strength and Tone - Quadriceps - 5/5. Hamstrings - 5/5. ROM: Flexion - AROM - 100 . Extension - AROM - 0 . Note: painful. Stability - Valgus Laxity at 30 - None. Valgus Laxity at 0 - None. Varus Laxity at 30 - None. Varus Laxity at 0 - None. Lachman - Negative. Anterior Drawer Test - Negative. Posterior Drawer Test - Negative. Right Knee - Deformities/Malalignments/Discrepancies - no deformities noted. Special Tests - McMurray Test (lateral) - negative. McMurray Test (medial) - negative. Patellar Compression Pain - mild pain. Left Lower Extremity: Left Knee: Inspection and Palpation - Tenderness - medial joint line tender to palpation, no tenderness to palpation of the superior calf, no tenderness to palpation of the pes  anserine bursa, no tenderness to palpation of the quadriceps tendon, no tenderness to palpation of the patellar tendon, no tenderness to palpation of the patella, no tenderness to palpation of the lateral joint line, no tenderness to palpation of the fibular head, no tenderness to palpation of the peroneal nerve. Patellar Tendon - no pain to palpation of the patellar tendon. Swelling - periarticular swelling present. Effusion - trace. Tissue tension/texture is - soft. Pulses - 2+. Sensation - intact to light touch. Skin - Color - no ecchymosis, no erythema.  Strength and Tone - Quadriceps - 5/5. Hamstrings - 5/5. ROM: Flexion - AROM - 110 . Extension - AROM - 0 . Stability - Valgus Laxity at 30 - None. Valgus Laxity at 0 - None. Varus Laxity at 30 - None. Varus Laxity at 0 - None. Lachman - Negative. Anterior Drawer Test - Negative. Posterior Drawer Test - Negative. Left Knee - Deformities/Malalignments/Discrepancies - no deformities noted. Special Tests - McMurray Test (lateral) - negative. McMurray Test (medial) - negative. Patellar Compression Pain - no pain.  Imaging Xrays with end-stage osteoarthritis right knee, severe, bone on bone medial joint line, varus deformity.  Assessment & Plan Primary osteoarthritis of right knee (M17.11) Pt with end-stage Right knee DJD, bone-on-bone, refractory to conservative tx, scheduled for right total knee replacement on 03/26/15 by Dr. Tonita Cong. We again discussed the procedure itself as well as risks, complications and alternatives, including but not limited to DVT, PE, infx, bleeding, failure of procedure, need for secondary procedure including manipulation, nerve injury, ongoing pain/symptoms, anesthesia risk, even stroke or death. Also discussed typical post-op protocols, activity restrictions, need for PT, flexion/extension exercises, time out of work. Discussed need for DVT ppx post-op with Xarelto then ASA per protocol. Discussed dental ppx. Also discussed  limitations post-operatively such as kneeling and squatting. All questions were answered. Patient desires to proceed with surgery as scheduled. Will hold ASA and NSAIDs accordingly. Will remain NPO after MN night before surgery. Will present to [facility] for pre-op testing- staph positive, plan clinda and kefzol peri-op for ppx. Plan Xarelto 2 weeks post-op for DVT ppx then ASA. Plan Oxycodone or PO Dilaudid for pain, Robaxin, Colace. Plan home with HHPT post-op with family members at home for assistance. Will follow up 10-14 days post-op for suture removal and xrays. We also discussed getting back into her pool once her incision is fully healed as well as eventual mederma use to decrease scarring.  Plan right total knee replacement  Signed electronically by Cecilie Kicks, PA-C for Dr. Tonita Cong

## 2015-03-25 NOTE — Progress Notes (Signed)
Patient called and informed of date and time  change for surgery. Surgery date now for Friday 03/27/2015 and to arrive at Short Stay at [facility] at 1100 am on 03/27/2015. Patient instructed may have clear liquids from midnight up until 0700 am morning of surgery and nothing after 0700 am until after surgery. Patient verbalized understanding.

## 2015-03-27 ENCOUNTER — Encounter (HOSPITAL_COMMUNITY): Payer: Self-pay | Admitting: *Deleted

## 2015-03-27 ENCOUNTER — Inpatient Hospital Stay (HOSPITAL_COMMUNITY): Payer: Medicare Other | Admitting: Anesthesiology

## 2015-03-27 ENCOUNTER — Inpatient Hospital Stay (HOSPITAL_COMMUNITY)
Admission: RE | Admit: 2015-03-27 | Discharge: 2015-03-29 | DRG: 470 | Disposition: A | Discharge status: Home Health | Payer: Medicare Other | Source: Ambulatory Visit | Attending: Specialist | Admitting: Specialist

## 2015-03-27 ENCOUNTER — Encounter (HOSPITAL_COMMUNITY)
Admission: RE | Disposition: A | Discharge status: Home Health | Payer: Self-pay | Source: Ambulatory Visit | Attending: Specialist

## 2015-03-27 ENCOUNTER — Inpatient Hospital Stay (HOSPITAL_COMMUNITY): Payer: Medicare Other

## 2015-03-27 DIAGNOSIS — Z86010 Personal history of colonic polyps: Secondary | ICD-10-CM | POA: Diagnosis not present

## 2015-03-27 DIAGNOSIS — Z833 Family history of diabetes mellitus: Secondary | ICD-10-CM | POA: Diagnosis not present

## 2015-03-27 DIAGNOSIS — R0981 Nasal congestion: Secondary | ICD-10-CM | POA: Diagnosis not present

## 2015-03-27 DIAGNOSIS — Z79899 Other long term (current) drug therapy: Secondary | ICD-10-CM | POA: Diagnosis not present

## 2015-03-27 DIAGNOSIS — M25561 Pain in right knee: Secondary | ICD-10-CM | POA: Diagnosis present

## 2015-03-27 DIAGNOSIS — E039 Hypothyroidism, unspecified: Secondary | ICD-10-CM | POA: Diagnosis present

## 2015-03-27 DIAGNOSIS — Z87442 Personal history of urinary calculi: Secondary | ICD-10-CM | POA: Diagnosis not present

## 2015-03-27 DIAGNOSIS — Z8249 Family history of ischemic heart disease and other diseases of the circulatory system: Secondary | ICD-10-CM | POA: Diagnosis not present

## 2015-03-27 DIAGNOSIS — Z96659 Presence of unspecified artificial knee joint: Secondary | ICD-10-CM

## 2015-03-27 DIAGNOSIS — Z981 Arthrodesis status: Secondary | ICD-10-CM | POA: Diagnosis not present

## 2015-03-27 DIAGNOSIS — Z825 Family history of asthma and other chronic lower respiratory diseases: Secondary | ICD-10-CM

## 2015-03-27 DIAGNOSIS — M1711 Unilateral primary osteoarthritis, right knee: Principal | ICD-10-CM | POA: Diagnosis present

## 2015-03-27 HISTORY — PX: TOTAL KNEE ARTHROPLASTY: SHX125

## 2015-03-27 LAB — TYPE AND SCREEN
ABO/RH(D): O POS
Antibody Screen: NEGATIVE

## 2015-03-27 SURGERY — ARTHROPLASTY, KNEE, TOTAL
Anesthesia: Spinal | Site: Knee | Laterality: Right

## 2015-03-27 MED ORDER — FENTANYL CITRATE (PF) 100 MCG/2ML IJ SOLN
INTRAMUSCULAR | Status: AC
  Filled 2015-03-27: qty 4

## 2015-03-27 MED ORDER — DIPHENHYDRAMINE HCL 12.5 MG/5ML PO ELIX
12.5000 mg | ORAL_SOLUTION | ORAL | Status: DC | PRN
  Filled 2015-03-27: qty 10

## 2015-03-27 MED ORDER — LACTATED RINGERS IV SOLN
INTRAVENOUS | Status: DC
  Administered 2015-03-27: 1000 mL via INTRAVENOUS
  Administered 2015-03-27: 15:00:00 via INTRAVENOUS

## 2015-03-27 MED ORDER — LIDOCAINE HCL (CARDIAC) 20 MG/ML IV SOLN
INTRAVENOUS | Status: AC
  Filled 2015-03-27: qty 5

## 2015-03-27 MED ORDER — VITAMIN C 500 MG PO TABS
500.0000 mg | ORAL_TABLET | Freq: Every day | ORAL | Status: DC
  Filled 2015-03-27 (×3): qty 1

## 2015-03-27 MED ORDER — POLYMYXIN B SULFATE 500000 UNITS IJ SOLR
INTRAMUSCULAR | Status: AC
  Filled 2015-03-27: qty 1

## 2015-03-27 MED ORDER — ACETAMINOPHEN 650 MG RE SUPP
650.0000 mg | Freq: Four times a day (QID) | RECTAL | Status: DC | PRN
  Filled 2015-03-27: qty 1

## 2015-03-27 MED ORDER — RIVAROXABAN 10 MG PO TABS
10.0000 mg | ORAL_TABLET | Freq: Every day | ORAL | Status: DC
  Administered 2015-03-28 – 2015-03-29 (×2): 10 mg via ORAL
  Filled 2015-03-27 (×3): qty 1

## 2015-03-27 MED ORDER — SODIUM CHLORIDE 0.9 % IR SOLN
Status: DC | PRN
  Administered 2015-03-27 (×2): 1000 mL

## 2015-03-27 MED ORDER — METOCLOPRAMIDE HCL 5 MG PO TABS
5.0000 mg | ORAL_TABLET | Freq: Three times a day (TID) | ORAL | Status: DC | PRN
  Administered 2015-03-29: 10 mg via ORAL
  Filled 2015-03-27 (×3): qty 2

## 2015-03-27 MED ORDER — VITAMIN B-12 1000 MCG PO TABS
1000.0000 ug | ORAL_TABLET | Freq: Every day | ORAL | Status: DC
  Filled 2015-03-27 (×3): qty 1

## 2015-03-27 MED ORDER — METOCLOPRAMIDE HCL 5 MG/ML IJ SOLN
5.0000 mg | Freq: Three times a day (TID) | INTRAMUSCULAR | Status: DC | PRN
  Administered 2015-03-27 – 2015-03-28 (×2): 10 mg via INTRAVENOUS
  Filled 2015-03-27 (×2): qty 2

## 2015-03-27 MED ORDER — PROPOFOL 10 MG/ML IV BOLUS
INTRAVENOUS | Status: AC
  Filled 2015-03-27: qty 20

## 2015-03-27 MED ORDER — ALBUTEROL SULFATE (2.5 MG/3ML) 0.083% IN NEBU: 3.0000 mL | INHALATION_SOLUTION | Freq: Four times a day (QID) | RESPIRATORY_TRACT | Status: DC | PRN

## 2015-03-27 MED ORDER — PROMETHAZINE HCL 25 MG/ML IJ SOLN: 6.2500 mg | INTRAMUSCULAR | Status: DC | PRN

## 2015-03-27 MED ORDER — HYDROMORPHONE HCL 1 MG/ML IJ SOLN: 0.2500 mg | INTRAMUSCULAR | Status: DC | PRN

## 2015-03-27 MED ORDER — BUPIVACAINE IN DEXTROSE 0.75-8.25 % IT SOLN
INTRATHECAL | Status: DC | PRN
  Administered 2015-03-27: 2 mL via INTRATHECAL

## 2015-03-27 MED ORDER — BUPIVACAINE-EPINEPHRINE (PF) 0.25% -1:200000 IJ SOLN
INTRAMUSCULAR | Status: AC
  Filled 2015-03-27: qty 30

## 2015-03-27 MED ORDER — MAGNESIUM CITRATE PO SOLN
1.0000 | Freq: Once | ORAL | Status: DC | PRN
  Filled 2015-03-27: qty 296

## 2015-03-27 MED ORDER — ONDANSETRON HCL 4 MG PO TABS
4.0000 mg | ORAL_TABLET | Freq: Four times a day (QID) | ORAL | Status: DC | PRN
  Filled 2015-03-27: qty 1

## 2015-03-27 MED ORDER — FENTANYL CITRATE (PF) 100 MCG/2ML IJ SOLN
INTRAMUSCULAR | Status: DC | PRN
  Administered 2015-03-27 (×2): 50 ug via INTRAVENOUS

## 2015-03-27 MED ORDER — RISAQUAD PO CAPS
1.0000 | ORAL_CAPSULE | Freq: Every day | ORAL | Status: DC
  Administered 2015-03-28 – 2015-03-29 (×2): 1 via ORAL
  Filled 2015-03-27 (×3): qty 1

## 2015-03-27 MED ORDER — LEVOTHYROXINE SODIUM 100 MCG PO TABS
100.0000 ug | ORAL_TABLET | Freq: Every day | ORAL | Status: DC
  Administered 2015-03-28 – 2015-03-29 (×2): 100 ug via ORAL
  Filled 2015-03-27 (×3): qty 1

## 2015-03-27 MED ORDER — MENTHOL 3 MG MT LOZG
1.0000 | LOZENGE | OROMUCOSAL | Status: DC | PRN
  Filled 2015-03-27: qty 9

## 2015-03-27 MED ORDER — CEFAZOLIN SODIUM-DEXTROSE 2-3 GM-% IV SOLR
2.0000 g | Freq: Four times a day (QID) | INTRAVENOUS | Status: AC
  Administered 2015-03-27 – 2015-03-28 (×3): 2 g via INTRAVENOUS
  Filled 2015-03-27 (×3): qty 50

## 2015-03-27 MED ORDER — DEXAMETHASONE SODIUM PHOSPHATE 10 MG/ML IJ SOLN
INTRAMUSCULAR | Status: AC
  Filled 2015-03-27: qty 1

## 2015-03-27 MED ORDER — SODIUM CHLORIDE 0.9 % IR SOLN
Status: DC | PRN
  Administered 2015-03-27: 500 mL

## 2015-03-27 MED ORDER — BUPROPION HCL 100 MG PO TABS
100.0000 mg | ORAL_TABLET | Freq: Every morning | ORAL | Status: DC
Start: 2015-03-28 — End: 2015-03-29
  Filled 2015-03-27 (×2): qty 1

## 2015-03-27 MED ORDER — METHOCARBAMOL 1000 MG/10ML IJ SOLN
500.0000 mg | Freq: Four times a day (QID) | INTRAVENOUS | Status: DC | PRN
  Administered 2015-03-27: 500 mg via INTRAVENOUS
  Filled 2015-03-27: qty 5

## 2015-03-27 MED ORDER — ONDANSETRON HCL 4 MG/2ML IJ SOLN
INTRAMUSCULAR | Status: AC
  Filled 2015-03-27: qty 2

## 2015-03-27 MED ORDER — MIDAZOLAM HCL 2 MG/2ML IJ SOLN
INTRAMUSCULAR | Status: AC
  Filled 2015-03-27: qty 4

## 2015-03-27 MED ORDER — ALUM & MAG HYDROXIDE-SIMETH 200-200-20 MG/5ML PO SUSP
30.0000 mL | ORAL | Status: DC | PRN
Start: 2015-03-27 — End: 2015-03-29
  Filled 2015-03-27: qty 30

## 2015-03-27 MED ORDER — OXYCODONE HCL 5 MG PO TABS
5.0000 mg | ORAL_TABLET | ORAL | Status: DC | PRN
  Administered 2015-03-27 – 2015-03-29 (×12): 10 mg via ORAL
  Filled 2015-03-27 (×12): qty 2

## 2015-03-27 MED ORDER — CEFAZOLIN SODIUM-DEXTROSE 2-3 GM-% IV SOLR
2.0000 g | INTRAVENOUS | Status: AC
  Administered 2015-03-27: 2 g via INTRAVENOUS

## 2015-03-27 MED ORDER — OXYCODONE HCL 5 MG PO TABS: ORAL_TABLET | ORAL | Status: DC

## 2015-03-27 MED ORDER — HYDROMORPHONE HCL 1 MG/ML IJ SOLN
1.0000 mg | INTRAMUSCULAR | Status: DC | PRN
  Administered 2015-03-27 – 2015-03-28 (×8): 1 mg via INTRAVENOUS
  Filled 2015-03-27 (×8): qty 1

## 2015-03-27 MED ORDER — LIDOCAINE HCL (CARDIAC) 20 MG/ML IV SOLN
INTRAVENOUS | Status: DC | PRN
  Administered 2015-03-27: 30 mg via INTRAVENOUS

## 2015-03-27 MED ORDER — CEFAZOLIN SODIUM-DEXTROSE 2-3 GM-% IV SOLR
INTRAVENOUS | Status: AC
Start: 2015-03-27 — End: 2015-03-27
  Filled 2015-03-27: qty 50

## 2015-03-27 MED ORDER — ALPRAZOLAM 0.5 MG PO TABS
0.5000 mg | ORAL_TABLET | Freq: Every evening | ORAL | Status: DC | PRN
  Administered 2015-03-27 – 2015-03-28 (×2): 0.5 mg via ORAL
  Filled 2015-03-27 (×2): qty 1

## 2015-03-27 MED ORDER — CLINDAMYCIN PHOSPHATE 900 MG/50ML IV SOLN
INTRAVENOUS | Status: AC
  Filled 2015-03-27: qty 50

## 2015-03-27 MED ORDER — METHOCARBAMOL 500 MG PO TABS
500.0000 mg | ORAL_TABLET | Freq: Four times a day (QID) | ORAL | Status: DC | PRN
  Administered 2015-03-28 – 2015-03-29 (×2): 500 mg via ORAL
  Filled 2015-03-27 (×3): qty 1

## 2015-03-27 MED ORDER — RIVAROXABAN 10 MG PO TABS
10.0000 mg | ORAL_TABLET | Freq: Every day | ORAL | Status: DC
Start: 2015-03-27 — End: 2015-09-17

## 2015-03-27 MED ORDER — DOCUSATE SODIUM 100 MG PO CAPS
100.0000 mg | ORAL_CAPSULE | Freq: Two times a day (BID) | ORAL | Status: DC
  Administered 2015-03-27 – 2015-03-29 (×4): 100 mg via ORAL
  Filled 2015-03-27 (×3): qty 1

## 2015-03-27 MED ORDER — MIDAZOLAM HCL 5 MG/5ML IJ SOLN
INTRAMUSCULAR | Status: DC | PRN
  Administered 2015-03-27: 2 mg via INTRAVENOUS

## 2015-03-27 MED ORDER — BUPIVACAINE-EPINEPHRINE 0.25% -1:200000 IJ SOLN
INTRAMUSCULAR | Status: DC | PRN
  Administered 2015-03-27: 50 mL

## 2015-03-27 MED ORDER — PHENOL 1.4 % MT LIQD
1.0000 | OROMUCOSAL | Status: DC | PRN
  Filled 2015-03-27: qty 177

## 2015-03-27 MED ORDER — METHOCARBAMOL 500 MG PO TABS: 500.0000 mg | ORAL_TABLET | Freq: Three times a day (TID) | ORAL | Status: DC

## 2015-03-27 MED ORDER — MEPERIDINE HCL 50 MG/ML IJ SOLN: 6.2500 mg | INTRAMUSCULAR | Status: DC | PRN

## 2015-03-27 MED ORDER — KCL IN DEXTROSE-NACL 20-5-0.45 MEQ/L-%-% IV SOLN
INTRAVENOUS | Status: AC
  Administered 2015-03-27: 19:00:00 via INTRAVENOUS
  Administered 2015-03-28: 75 mL/h via INTRAVENOUS
  Filled 2015-03-27 (×3): qty 1000

## 2015-03-27 MED ORDER — LACTATED RINGERS IV SOLN: INTRAVENOUS | Status: DC

## 2015-03-27 MED ORDER — CLINDAMYCIN PHOSPHATE 900 MG/50ML IV SOLN
900.0000 mg | INTRAVENOUS | Status: AC
  Administered 2015-03-27: 900 mg via INTRAVENOUS

## 2015-03-27 MED ORDER — ACETAMINOPHEN 325 MG PO TABS: 650.0000 mg | ORAL_TABLET | Freq: Four times a day (QID) | ORAL | Status: DC | PRN

## 2015-03-27 MED ORDER — PROPOFOL INFUSION 10 MG/ML OPTIME
INTRAVENOUS | Status: DC | PRN
  Administered 2015-03-27: 100 ug/kg/min via INTRAVENOUS

## 2015-03-27 MED ORDER — BISACODYL 5 MG PO TBEC
5.0000 mg | DELAYED_RELEASE_TABLET | Freq: Every day | ORAL | Status: DC | PRN
  Filled 2015-03-27: qty 1

## 2015-03-27 MED ORDER — ONDANSETRON HCL 4 MG/2ML IJ SOLN
4.0000 mg | Freq: Four times a day (QID) | INTRAMUSCULAR | Status: DC | PRN
  Administered 2015-03-27 – 2015-03-28 (×2): 4 mg via INTRAVENOUS
  Filled 2015-03-27 (×2): qty 2

## 2015-03-27 MED ORDER — DEXAMETHASONE SODIUM PHOSPHATE 10 MG/ML IJ SOLN
INTRAMUSCULAR | Status: DC | PRN
  Administered 2015-03-27: 10 mg via INTRAVENOUS

## 2015-03-27 MED ORDER — SENNOSIDES-DOCUSATE SODIUM 8.6-50 MG PO TABS
1.0000 | ORAL_TABLET | Freq: Every evening | ORAL | Status: DC | PRN
  Filled 2015-03-27: qty 1

## 2015-03-27 MED ORDER — PROPOFOL 10 MG/ML IV BOLUS
INTRAVENOUS | Status: DC | PRN
  Administered 2015-03-27 (×3): 20 mg via INTRAVENOUS

## 2015-03-27 SURGICAL SUPPLY — 71 items
BAG ZIPLOCK 12X15 (MISCELLANEOUS) IMPLANT
BANDAGE ELASTIC 4 VELCRO ST LF (GAUZE/BANDAGES/DRESSINGS) ×2 IMPLANT
BANDAGE ELASTIC 6 VELCRO ST LF (GAUZE/BANDAGES/DRESSINGS) ×2 IMPLANT
BANDAGE ESMARK 6X9 LF (GAUZE/BANDAGES/DRESSINGS) ×1 IMPLANT
BLADE SAG 18X100X1.27 (BLADE) ×2 IMPLANT
BLADE SAW SGTL 13.0X1.19X90.0M (BLADE) ×2 IMPLANT
BNDG ESMARK 6X9 LF (GAUZE/BANDAGES/DRESSINGS) ×2
CAP KNEE TOTAL 3 SIGMA ×2 IMPLANT
CEMENT HV SMART SET (Cement) ×4 IMPLANT
CHLORAPREP W/TINT 26ML (MISCELLANEOUS) IMPLANT
CLOTH 2% CHLOROHEXIDINE 3PK (PERSONAL CARE ITEMS) ×2 IMPLANT
CUFF TOURN SGL QUICK 34 (TOURNIQUET CUFF) ×1
CUFF TRNQT CYL 34X4X40X1 (TOURNIQUET CUFF) ×1 IMPLANT
DECANTER SPIKE VIAL GLASS SM (MISCELLANEOUS) ×2 IMPLANT
DRAPE INCISE IOBAN 66X45 STRL (DRAPES) IMPLANT
DRAPE ORTHO SPLIT 77X108 STRL (DRAPES) ×2
DRAPE POUCH INSTRU U-SHP 10X18 (DRAPES) ×2 IMPLANT
DRAPE SHEET LG 3/4 BI-LAMINATE (DRAPES) ×6 IMPLANT
DRAPE SURG ORHT 6 SPLT 77X108 (DRAPES) ×2 IMPLANT
DRAPE U-SHAPE 47X51 STRL (DRAPES) ×2 IMPLANT
DRSG ADAPTIC 3X8 NADH LF (GAUZE/BANDAGES/DRESSINGS) IMPLANT
DRSG AQUACEL AG ADV 3.5X10 (GAUZE/BANDAGES/DRESSINGS) ×2 IMPLANT
DRSG PAD ABDOMINAL 8X10 ST (GAUZE/BANDAGES/DRESSINGS) IMPLANT
DRSG TEGADERM 4X4.75 (GAUZE/BANDAGES/DRESSINGS) IMPLANT
DURAPREP 26ML APPLICATOR (WOUND CARE) ×2 IMPLANT
ELECT REM PT RETURN 9FT ADLT (ELECTROSURGICAL) ×2
ELECTRODE REM PT RTRN 9FT ADLT (ELECTROSURGICAL) ×1 IMPLANT
EVACUATOR 1/8 PVC DRAIN (DRAIN) IMPLANT
FACESHIELD WRAPAROUND (MASK) ×10 IMPLANT
GAUZE SPONGE 2X2 8PLY STRL LF (GAUZE/BANDAGES/DRESSINGS) IMPLANT
GAUZE SPONGE 4X4 12PLY STRL (GAUZE/BANDAGES/DRESSINGS) IMPLANT
GLOVE BIOGEL PI IND STRL 7.5 (GLOVE) ×1 IMPLANT
GLOVE BIOGEL PI IND STRL 8 (GLOVE) ×1 IMPLANT
GLOVE BIOGEL PI INDICATOR 7.5 (GLOVE) ×1
GLOVE BIOGEL PI INDICATOR 8 (GLOVE) ×1
GLOVE SURG SS PI 7.5 STRL IVOR (GLOVE) ×2 IMPLANT
GLOVE SURG SS PI 8.0 STRL IVOR (GLOVE) ×4 IMPLANT
GOWN STRL REUS W/TWL XL LVL3 (GOWN DISPOSABLE) ×4 IMPLANT
HANDPIECE INTERPULSE COAX TIP (DISPOSABLE) ×1
IMMOBILIZER KNEE 20 (SOFTGOODS) ×4 IMPLANT
IMMOBILIZER KNEE 20 THIGH 36 (SOFTGOODS) ×1 IMPLANT
KIT BASIN OR (CUSTOM PROCEDURE TRAY) ×2 IMPLANT
MANIFOLD NEPTUNE II (INSTRUMENTS) ×2 IMPLANT
NDL SAFETY ECLIPSE 18X1.5 (NEEDLE) ×1 IMPLANT
NEEDLE HYPO 18GX1.5 SHARP (NEEDLE) ×1
NS IRRIG 1000ML POUR BTL (IV SOLUTION) IMPLANT
PACK TOTAL JOINT (CUSTOM PROCEDURE TRAY) ×2 IMPLANT
PADDING CAST COTTON 6X4 STRL (CAST SUPPLIES) IMPLANT
PEN SKIN MARKING BROAD (MISCELLANEOUS) ×2 IMPLANT
POSITIONER SURGICAL ARM (MISCELLANEOUS) ×2 IMPLANT
SET HNDPC FAN SPRY TIP SCT (DISPOSABLE) ×1 IMPLANT
SPONGE GAUZE 2X2 STER 10/PKG (GAUZE/BANDAGES/DRESSINGS)
SPONGE SURGIFOAM ABS GEL 100 (HEMOSTASIS) IMPLANT
STAPLER VISISTAT (STAPLE) IMPLANT
STRIP CLOSURE SKIN 1/2X4 (GAUZE/BANDAGES/DRESSINGS) IMPLANT
SUCTION FRAZIER 12FR DISP (SUCTIONS) ×2 IMPLANT
SUT BONE WAX W31G (SUTURE) IMPLANT
SUT MNCRL AB 4-0 PS2 18 (SUTURE) IMPLANT
SUT VIC AB 1 CT1 27 (SUTURE) ×1
SUT VIC AB 1 CT1 27XBRD ANTBC (SUTURE) ×1 IMPLANT
SUT VIC AB 2-0 CT1 27 (SUTURE) ×3
SUT VIC AB 2-0 CT1 TAPERPNT 27 (SUTURE) ×3 IMPLANT
SUT VLOC 180 0 24IN GS25 (SUTURE) ×2 IMPLANT
SYR 50ML LL SCALE MARK (SYRINGE) ×2 IMPLANT
TOWEL OR 17X26 10 PK STRL BLUE (TOWEL DISPOSABLE) ×2 IMPLANT
TOWEL OR NON WOVEN STRL DISP B (DISPOSABLE) IMPLANT
TOWER CARTRIDGE SMART MIX (DISPOSABLE) ×2 IMPLANT
TRAY FOLEY W/METER SILVER 14FR (SET/KITS/TRAYS/PACK) ×2 IMPLANT
WATER STERILE IRR 1500ML POUR (IV SOLUTION) ×2 IMPLANT
WRAP KNEE MAXI GEL POST OP (GAUZE/BANDAGES/DRESSINGS) ×2 IMPLANT
YANKAUER SUCT BULB TIP 10FT TU (MISCELLANEOUS) ×2 IMPLANT

## 2015-03-27 NOTE — Op Note (Signed)
Carol [name], Carol [name]:  [id number]  MEDICAL RECORD NO.:  [id number]  LOCATION:  2297                         FACILITY:  [facility]  PHYSICIAN:  Susa Day, M.D.    DATE OF BIRTH:  12/22/1955  DATE OF PROCEDURE:  03/27/2015 DATE OF DISCHARGE:                              OPERATIVE REPORT   PREOPERATIVE DIAGNOSIS:  End-stage osteoarthrosis, degenerative joint disease of the right knee.  POSTOPERATIVE DIAGNOSIS:  End-stage osteoarthrosis, degenerative joint disease of the right knee.  PROCEDURE PERFORMED:  Right total knee arthroplasty.  COMPLEMENTS:  DePuy rotating platform, 3 femur, 3 tibia, 12.5 insert, 38 patella.  ANESTHESIA:  General.  ASSISTANT:  Cleophas Dunker.  COMPLICATIONS:  Technical difficulty increased due to the patient's BMI which is 40.  HISTORY:  A 59 year old with bone-on-bone, elevated BMI.  Refractory to conservative treatment, significant affect to her activities of daily living indicated for replacement of degenerated joint.  Risk and benefits discussed including bleeding, infection, DVT, PE, suboptimal range of motion, need for revision, arthrofibrosis, etc.  TECHNIQUE:  With the patient in supine position, after induction of adequate general anesthesia, 3 g Kefzol, 900 clindamycin, the right lower extremity was prepped and draped and exsanguinated in usual sterile fashion.  Thigh tourniquet inflated to 300 mmHg.  Knee flexed. Midline incision was made.  Full-thickness flaps developed.  Deep subcutaneous adipose tissue noted.  Median parapatellar arthrotomy performed.  Patella everted and knee flexed.  We slightly elevated soft tissues medially preserving the MCL.  Tricompartmental osteoarthrosis was noted particularly on the medial compartment, grade 4 changes, and of the patellofemoral joint.  Osteophytes removed with a rongeur.  ACL and remnants of the medial and lateral menisci were removed.  Step drill was utilized to  enter the femoral canal, was irrigated and 5-degree right placed, 10 of the distal femur selected, distal femoral cut, placed.  We sized off the distal femur to a 3, pinned it in 3 degrees of external rotation.  I then performed our anterior, posterior, and chamfer cuts with soft tissues protected at all times with retractor. Attention turned towards the tibia.  External alignment guide, 10 off the high side, which was lateral.  Parallel to the shaft, bisecting the tibiotalar joint, slight slope, we pinned that and then performed our cut.  Soft tissues protected.  The flexion-extension gaps were measured, they were equivalent.  Pins were pulled.  We then turned our attention towards completing the tibia, subluxed, retractors placed, measured to a 3 just to the medial aspect of tibial tubercle maximizing our coverage, pinned it and centrally drilled it and used our punch guide.  We then completed the femur with a box cut using our 3 jig, pinned it, performed our box cut, removed that jig and placed a trial 3 femur and a 10 insert.  We had full extension, full flexion, and slight translation in anterior drawer.  We then proceeded with the patella, it was measured to a 23.  We planed it to a 15 using a patellar jig.  Drilled our peg holes medially after measuring a 35, and we had excellent patellofemoral tracking with that trial patella.  All trials were  removed.  We checked posteriorly, popliteus was intact as was the capsule.  Cauterized the geniculates.  Pulsatile lavage utilized and cleaned the knee, flexed the knee, all surfaces thoroughly dried.  We mixed the cement on the back table in appropriate fashion.  I then injected into the tibial canal, proximal tibia, and digitally pressurized it.  We selected a 3 tray and impacted it, redundant cement removed.  We cemented the 3 femur, redundant cement removed.  Placed a 12.5 insert and reduced the knee and held an axial load throughout the  curing of the cement.  We cemented the patellar button as well with a clamp.  After appropriate curing of the cement, we had excellent patellofemoral tracking.  Excellent stability, full extension, full flexion, and good stability with varus and valgus stressing at 0-30 degrees.  Trials were removed, and we meticulously removed all cement.  Next, copiously irrigated.  Marcaine with epinephrine was infiltrated in the periarticular tissues.  I then selected a 12.5 insert.  Flexed the knee, subluxed it again, no cement remained.  We inserted our 12.5 insert and reduced the knee.  Full extension, full flexion, and good stability.  Copiously irrigated the wound.  We then deflated the tourniquet at 65 minutes, minimal bleeding, cauterized any vessels.  Copiously irrigated the wound.  In slight flexion, we reapproximated the patellar arthrotomy with #1 Vicryl interrupted figure-of-eight sutures, then over sewed with a running V- Loc.  Copiously irrigated the wound.  Multiple layers of 2-0 were used due to the patient's size and potential tension on the wound was selected with staples.  This was performed and a sterile dressing applied.  Full extension, full flexion.  Flexion to gravity at 90 degrees.  Negative anterior drawer and good stability with varus and valgus stressing 0-30 degrees.  Next, she was transported to the recovery room in satisfactory condition.  Placed under anesthesia was spinal.  Again, assistant, Cleophas Dunker, PA, and the technical difficulty increased due to the patient's elevated BMI of 40.     Susa Day, M.D.     Geralynn Rile  D:  03/27/2015  T:  03/27/2015  Job:  [id number]

## 2015-03-27 NOTE — Brief Op Note (Signed)
03/27/2015  3:56 PM  PATIENT:  Carol [name]  59 y.o. female  PRE-OPERATIVE DIAGNOSIS:  RIGHT KNEE DEGENERATIVE JOINT DISEASE  POST-OPERATIVE DIAGNOSIS:  RIGHT KNEE DEGENERATIVE JOINT DISEASE  PROCEDURE:  Procedure(s): RIGHT TOTAL KNEE ARTHROPLASTY (Right)  SURGEON:  Surgeon(s) and Role:    * Susa Day, MD - Primary  PHYSICIAN ASSISTANT:   ASSISTANTS: Bissell   ANESTHESIA:   general  EBL:  Total I/O In: 1000 [I.V.:1000] Out: -   BLOOD ADMINISTERED:none  DRAINS: none   LOCAL MEDICATIONS USED:  MARCAINE     SPECIMEN:  No Specimen  DISPOSITION OF SPECIMEN:  N/A  COUNTS:  YES  TOURNIQUET:   Total Tourniquet Time Documented: Thigh (Right) - 65 minutes Total: Thigh (Right) - 65 minutes   DICTATION: .Other Dictation: Dictation Number [id number]  PLAN OF CARE: Admit to inpatient   PATIENT DISPOSITION:  PACU - hemodynamically stable.   Delay start of Pharmacological VTE agent (>24hrs) due to surgical blood loss or risk of bleeding: no

## 2015-03-27 NOTE — Anesthesia Preprocedure Evaluation (Addendum)
Anesthesia Evaluation  Patient identified by MRN, date of birth, ID band Patient awake    Reviewed: Allergy & Precautions, NPO status , Patient's Chart, lab work & pertinent test results  History of Anesthesia Complications (+) PONV  Airway Mallampati: II  TM Distance: >3 FB Neck ROM: Full    Dental  (+) Teeth Intact   Pulmonary asthma ,  breath sounds clear to auscultation        Cardiovascular negative cardio ROS  Rhythm:Regular Rate:Normal     Neuro/Psych PSYCHIATRIC DISORDERS Anxiety Depression negative neurological ROS     GI/Hepatic   Endo/Other  Hypothyroidism   Renal/GU   negative genitourinary   Musculoskeletal  (+) Arthritis -, Osteoarthritis,    Abdominal   Peds negative pediatric ROS (+)  Hematology   Anesthesia Other Findings   Reproductive/Obstetrics                            Lab Results  Component Value Date   WBC 11.7* 03/19/2015   HGB 15.0 03/19/2015   HCT 46.4* 03/19/2015   MCV 90.4 03/19/2015   PLT 393 03/19/2015   Lab Results  Component Value Date   CREATININE 0.80 03/19/2015   BUN 18 03/19/2015   NA 141 03/19/2015   K 4.7 03/19/2015   CL 104 03/19/2015   CO2 28 03/19/2015   Lab Results  Component Value Date   INR 0.96 03/19/2015   INR 0.94 05/06/2011   INR 1.05 02/12/2011   12/2014: EKG: normal sinus rhythm.  Anesthesia Physical Anesthesia Plan  ASA: III  Anesthesia Plan: Spinal   Post-op Pain Management:    Induction: Intravenous  Airway Management Planned: Natural Airway and Simple Face Mask  Additional Equipment:   Intra-op Plan:   Post-operative Plan:   Informed Consent: I have reviewed the patients History and Physical, chart, labs and discussed the procedure including the risks, benefits and alternatives for the proposed anesthesia with the patient or authorized representative who has indicated his/her understanding and  acceptance.   Dental advisory given  Plan Discussed with: CRNA  Anesthesia Plan Comments:         Anesthesia Quick Evaluation

## 2015-03-27 NOTE — Interval H&P Note (Signed)
History and Physical Interval Note:  03/27/2015 1:41 PM  Carol [name]  has presented today for surgery, with the diagnosis of RIGHT KNEE DJD  The various methods of treatment have been discussed with the patient and family. After consideration of risks, benefits and other options for treatment, the patient has consented to  Procedure(s): RIGHT TOTAL KNEE ARTHROPLASTY (Right) as a surgical intervention .  The patient's history has been reviewed, patient examined, no change in status, stable for surgery.  I have reviewed the patient's chart and labs.  Questions were answered to the patient's satisfaction.     BEANE,JEFFREY C

## 2015-03-27 NOTE — H&P (View-Only) (Signed)
Carol [name] DOB: Aug 13, 1955 Married / Language: English / Race: White Female  H&P Date 03/23/15  Chief Complaint: Right knee pain  History of Present Illness The patient is a 59 year old female who comes in today for a preoperative History and Physical. The patient is scheduled for a right total knee arthroplasty to be performed by Dr. Johnn Hai, MD at [facility] on April 03, 2015. Carol [name] reports severe R knee pain for years, prior hx of arthroscopy, pain progressively worsening and refractory to steroid injections, viscosupplementation, bracing, activity modifications, home exercise program, pain medications, NSAIDs. Pain is interfering with ADLs at this point and she desires to proceed with surgery. She's starting taking oxycodone again since her last visit for the knee pain, it's making her itchy and she states she can't take benadryl.  Dr. Tonita Cong and the patient mutually agreed to proceed with a total knee replacement. Risks and benefits of the procedure were discussed including stiffness, suboptimal range of motion, persistent pain, infection requiring removal of prosthesis and reinsertion, need for prophylactic antibiotics in the future, for example, dental procedures, possible need for manipulation, revision in the future and also anesthetic complications including DVT, PE, etc. We discussed the perioperative course, time in the hospital, postoperative recovery and the need for elevation to control swelling. We also discussed the predicted range of motion and the probability that squatting and kneeling would be unobtainable in the future. In addition, postoperative anticoagulation was discussed. We have obtained preoperative medical clearance as necessary. Provided her illustrated handout and discussed it in detail. They will enroll in the total joint replacement educational forum at the hospital.  Allergies Ultram *ANALGESICS - OPIOID* Penicillin G Sodium  *PENICILLINS* Cymbalta *ANTIDEPRESSANTS* Neurontin *ANTICONVULSANTS* Ibuprofen *ANALGESICS - ANTI-INFLAMMATORY* Codeine Sulfate *ANALGESICS - OPIOID* Betadine *ANTISEPTICS & DISINFECTANTS*  Family History Heart Disease father Hypertension mother and grandmother mothers side Osteoarthritis mother and grandmother mothers side Diabetes Mellitus grandmother mothers side Cerebrovascular Accident grandmother mothers side Chronic Obstructive Lung Disease mother Congestive Heart Failure father First Degree Relatives  Social History  Current work status disabled Drug/Alcohol Rehab (Currently) no Pain Contract yes Tobacco / smoke exposure yes outdoors only Drug/Alcohol Rehab (Previously) no Marital status married Alcohol use current drinker; drinks beer Children 2 No alcohol use Number of flights of stairs before winded greater than 5 Illicit drug use no Living situation live with spouse Exercise Exercises never; does other Tobacco use Never smoker. never smoker Advance Directives none Post-Surgical Plans home with son as caregiver, lives on 1st level with 3 steps to enter  Medication History OxyCODONE HCl (10MG  Tablet, Oral) Active. Levothyroxine Sodium (200MCG Tablet, Oral) Active. ALPRAZolam (0.5MG  Tablet, Oral) Active. Promethazine HCl (Oral) Specific dose unknown - Active. Ventolin (Inhalation) Specific dose unknown - Active. Medications Reconciled  Pregnancy / Birth History Pregnant no  Past Surgical History  Hysterectomy partial (non-cancerous) Arthroscopy of Knee bilateral Colon Polyp Removal - Colonoscopy Appendectomy Spinal Surgery x4 Spinal Fusion lower back Gallbladder Surgery open Foot Surgery left Tonsillectomy Other Surgery bladder repair  Past Medical History Chronic Pain Hypothyroidism Anxiety Disorder Asthma Bronchitis past Pneumonia past Hemorrhoids Gallbladder Problems Urinary Tract  Infection past Kidney Stone Degenerative Disc Disease Measles Mumps  Review of Systems General Present- Fatigue and Night Sweats. Not Present- Chills, Fever, Memory Loss, Weight Gain and Weight Loss. Skin Present- Hives, Itching and Rash. Not Present- Eczema and Lesions. HEENT Present- Tinnitus. Not Present- Dentures, Double Vision, Headache, Hearing Loss and Visual Loss. Respiratory Present- Allergies, Cough,  Shortness of breath with exertion and Wheezing. Not Present- Chronic Cough, Coughing up blood and Shortness of breath at rest. Cardiovascular Not Present- Chest Pain, Difficulty Breathing Lying Down, Murmur, Palpitations, Racing/skipping heartbeats and Swelling. Gastrointestinal Present- Diarrhea, Jaundice and Nausea. Not Present- Abdominal Pain, Bloody Stool, Constipation, Difficulty Swallowing, Heartburn, Loss of appetitie and Vomiting. Female Genitourinary Present- Painful Urination and Urinating at Night. Not Present- Blood in Urine, Discharge, Flank Pain, Incontinence, Urgency, Urinary frequency, Urinary Retention and Weak urinary stream. Musculoskeletal Present- Back Pain, Joint Pain, Joint Swelling, Morning Stiffness, Muscle Pain, Muscle Weakness and Spasms. Neurological Not Present- Blackout spells, Difficulty with balance, Dizziness, Paralysis, Tremor and Weakness. Psychiatric Present- Insomnia.  Physical Exam General Mental Status -Alert, cooperative and good historian. General Appearance-pleasant, Not in acute distress. Orientation-Oriented X3. Build & Nutrition-Well nourished and Well developed.  Head and Neck Head-normocephalic, atraumatic . Neck Global Assessment - supple, no bruit auscultated on the right, no bruit auscultated on the left.  Eye Pupil - Bilateral-Regular and Round. Motion - Bilateral-EOMI.  Chest and Lung Exam Auscultation Breath sounds - clear at anterior chest wall and clear at posterior chest wall. Adventitious sounds - No  Adventitious sounds.  Cardiovascular Auscultation Rhythm - Regular rate and rhythm. Heart Sounds - S1 WNL and S2 WNL. Murmurs & Other Heart Sounds - Auscultation of the heart reveals - No Murmurs.  Abdomen Palpation/Percussion Tenderness - Abdomen is non-tender to palpation. Rigidity (guarding) - Abdomen is soft. Auscultation Auscultation of the abdomen reveals - Bowel sounds normal.  Female Genitourinary Not done, not pertinent to present illness  Musculoskeletal Right Knee: Inspection and Palpation - Tenderness - medial joint line tender to palpation and lateral joint line tender to palpation, no tenderness to palpation of the superior calf, no tenderness to palpation of the quadriceps tendon, no tenderness to palpation of the patellar tendon, no tenderness to palpation of the patella, no tenderness to palpation of the fibular head, no tenderness to palpation of the peroneal nerve. Patellar Tendon - no pain to palpation of the patellar tendon. Swelling - periarticular swelling present. Effusion - trace. Tissue tension/texture is - soft. Crepitus - moderate patellofemoral crepitus. Pulses - 2+. Sensation - intact to light touch. Skin - Color - no ecchymosis, no erythema. Strength and Tone - Quadriceps - 5/5. Hamstrings - 5/5. ROM: Flexion - AROM - 100 . Extension - AROM - 0 . Note: painful. Stability - Valgus Laxity at 30 - None. Valgus Laxity at 0 - None. Varus Laxity at 30 - None. Varus Laxity at 0 - None. Lachman - Negative. Anterior Drawer Test - Negative. Posterior Drawer Test - Negative. Right Knee - Deformities/Malalignments/Discrepancies - no deformities noted. Special Tests - McMurray Test (lateral) - negative. McMurray Test (medial) - negative. Patellar Compression Pain - mild pain. Left Lower Extremity: Left Knee: Inspection and Palpation - Tenderness - medial joint line tender to palpation, no tenderness to palpation of the superior calf, no tenderness to palpation of the pes  anserine bursa, no tenderness to palpation of the quadriceps tendon, no tenderness to palpation of the patellar tendon, no tenderness to palpation of the patella, no tenderness to palpation of the lateral joint line, no tenderness to palpation of the fibular head, no tenderness to palpation of the peroneal nerve. Patellar Tendon - no pain to palpation of the patellar tendon. Swelling - periarticular swelling present. Effusion - trace. Tissue tension/texture is - soft. Pulses - 2+. Sensation - intact to light touch. Skin - Color - no ecchymosis, no erythema.  Strength and Tone - Quadriceps - 5/5. Hamstrings - 5/5. ROM: Flexion - AROM - 110 . Extension - AROM - 0 . Stability - Valgus Laxity at 30 - None. Valgus Laxity at 0 - None. Varus Laxity at 30 - None. Varus Laxity at 0 - None. Lachman - Negative. Anterior Drawer Test - Negative. Posterior Drawer Test - Negative. Left Knee - Deformities/Malalignments/Discrepancies - no deformities noted. Special Tests - McMurray Test (lateral) - negative. McMurray Test (medial) - negative. Patellar Compression Pain - no pain.  Imaging Xrays with end-stage osteoarthritis right knee, severe, bone on bone medial joint line, varus deformity.  Assessment & Plan Primary osteoarthritis of right knee (M17.11) Pt with end-stage Right knee DJD, bone-on-bone, refractory to conservative tx, scheduled for right total knee replacement on 03/26/15 by Dr. Tonita Cong. We again discussed the procedure itself as well as risks, complications and alternatives, including but not limited to DVT, PE, infx, bleeding, failure of procedure, need for secondary procedure including manipulation, nerve injury, ongoing pain/symptoms, anesthesia risk, even stroke or death. Also discussed typical post-op protocols, activity restrictions, need for PT, flexion/extension exercises, time out of work. Discussed need for DVT ppx post-op with Xarelto then ASA per protocol. Discussed dental ppx. Also discussed  limitations post-operatively such as kneeling and squatting. All questions were answered. Patient desires to proceed with surgery as scheduled. Will hold ASA and NSAIDs accordingly. Will remain NPO after MN night before surgery. Will present to [facility] for pre-op testing- staph positive, plan clinda and kefzol peri-op for ppx. Plan Xarelto 2 weeks post-op for DVT ppx then ASA. Plan Oxycodone or PO Dilaudid for pain, Robaxin, Colace. Plan home with HHPT post-op with family members at home for assistance. Will follow up 10-14 days post-op for suture removal and xrays. We also discussed getting back into her pool once her incision is fully healed as well as eventual mederma use to decrease scarring.  Plan right total knee replacement  Signed electronically by Cecilie Kicks, PA-C for Dr. Tonita Cong

## 2015-03-27 NOTE — Anesthesia Postprocedure Evaluation (Signed)
  Anesthesia Post-op Note  Patient: Carol [name]  Procedure(s) Performed: Procedure(s): RIGHT TOTAL KNEE ARTHROPLASTY (Right)  Patient Location: PACU  Anesthesia Type:Spinal  Level of Consciousness: awake, alert  and oriented  Airway and Oxygen Therapy: Patient Spontanous Breathing  Post-op Pain: none  Post-op Assessment: Post-op Vital signs reviewed and Patient's Cardiovascular Status Stable LLE Motor Response: No movement due to regional block LLE Sensation: Numbness RLE Motor Response: No movement due to regional block RLE Sensation: Numbness L Sensory Level: S1-Sole of foot, small toes R Sensory Level: S1-Sole of foot, small toes  Post-op Vital Signs: Reviewed and stable  Last Vitals:  Filed Vitals:   03/27/15 1707  BP:   Pulse: 79  Temp:   Resp: 14    Complications: No apparent anesthesia complications

## 2015-03-27 NOTE — Anesthesia Procedure Notes (Addendum)
Spinal Patient location during procedure: OR Start time: 03/27/2015 1:55 PM End time: 03/27/2015 2:10 PM Staffing Anesthesiologist: Montez Hageman Performed by: anesthesiologist  Preanesthetic Checklist Completed: patient identified, site marked, surgical consent, pre-op evaluation, timeout performed, IV checked, risks and benefits discussed and monitors and equipment checked Spinal Block Patient position: sitting Prep: Betadine Patient monitoring: heart rate, continuous pulse ox, blood pressure and cardiac monitor Approach: midline Location: L2-3 Injection technique: single-shot Needle Needle type: Whitacre and Introducer  Needle gauge: 24 G Needle length: 9 cm Additional Notes Negative paresthesia. Negative blood return. Positive free-flowing CSF. Expiration date of kit checked and confirmed. Patient tolerated procedure well, without complications.

## 2015-03-27 NOTE — Transfer of Care (Signed)
Immediate Anesthesia Transfer of Care Note  Patient: Carol [name]  Procedure(s) Performed: Procedure(s): RIGHT TOTAL KNEE ARTHROPLASTY (Right)  Patient Location: PACU  Anesthesia Type:Spinal  Level of Consciousness:  sedated, patient cooperative and responds to stimulation  Airway & Oxygen Therapy:Patient Spontanous Breathing    Post-op Assessment:  Report given to PACU RN and Post -op Vital signs reviewed and stable  Post vital signs:  Reviewed and stable  Last Vitals:  Filed Vitals:   03/27/15 1050  BP: 151/91  Pulse: 101  Temp: 36.6 C  Resp: 18    Complications: No apparent anesthesia complications

## 2015-03-28 LAB — CBC
HCT: 39.9 % (ref 36.0–46.0)
Hemoglobin: 12.8 g/dL (ref 12.0–15.0)
MCH: 28.8 pg (ref 26.0–34.0)
MCHC: 32.1 g/dL (ref 30.0–36.0)
MCV: 89.7 fL (ref 78.0–100.0)
Platelets: 372 10*3/uL (ref 150–400)
RBC: 4.45 MIL/uL (ref 3.87–5.11)
RDW: 13.6 % (ref 11.5–15.5)
WBC: 20.8 10*3/uL — ABNORMAL HIGH (ref 4.0–10.5)

## 2015-03-28 LAB — BASIC METABOLIC PANEL
Anion gap: 9 (ref 5–15)
BUN: 11 mg/dL (ref 6–20)
CO2: 24 mmol/L (ref 22–32)
Calcium: 8.6 mg/dL — ABNORMAL LOW (ref 8.9–10.3)
Chloride: 96 mmol/L — ABNORMAL LOW (ref 101–111)
Creatinine, Ser: 0.83 mg/dL (ref 0.44–1.00)
GFR calc Af Amer: >60 mL/min (ref >60)
GFR calc non Af Amer: >60 mL/min (ref >60)
Glucose, Bld: 219 mg/dL — ABNORMAL HIGH (ref 65–99)
Potassium: 4.4 mmol/L (ref 3.5–5.1)
Sodium: 129 mmol/L — ABNORMAL LOW (ref 135–145)

## 2015-03-28 MED ORDER — PROMETHAZINE HCL 25 MG PO TABS
12.5000 mg | ORAL_TABLET | Freq: Three times a day (TID) | ORAL | Status: DC | PRN
Start: 2015-03-28 — End: 2015-03-29
  Administered 2015-03-28 – 2015-03-29 (×3): 12.5 mg via ORAL
  Filled 2015-03-28 (×3): qty 1

## 2015-03-28 NOTE — Progress Notes (Signed)
Subjective: 1 Day Post-Op Procedure(s) (LRB): RIGHT TOTAL KNEE ARTHROPLASTY (Right) Patient reports pain as moderate to right knee.  Tolerating PO's, but does report nausea, no vomiting.  Denies calf pain or cp, sob. Reports a good night.  Objective: Vital signs in last 24 hours: Temp:  [97.7 F (36.5 C)-99.1 F (37.3 C)] 98.9 F (37.2 C) (09/03 0648) Pulse Rate:  [74-101] 82 (09/03 0648) Resp:  [13-18] 16 (09/03 0648) BP: (108-151)/(44-91) 125/51 mmHg (09/03 0648) SpO2:  [93 %-100 %] 95 % (09/03 0648) Weight:  [108.863 kg (240 lb)] 108.863 kg (240 lb) (09/02 1050)  Intake/Output from previous day: 09/02 0701 - 09/03 0700 In: 2660 [P.O.:960; I.V.:1700] Out: 1625 [Urine:1625] Intake/Output this shift: Total I/O In: -  Out: 50 [Urine:50]   Recent Labs  03/28/15 0420  HGB 12.8    Recent Labs  03/28/15 0420  WBC 20.8*  RBC 4.45  HCT 39.9  PLT 372    Recent Labs  03/28/15 0420  NA 129*  K 4.4  CL 96*  CO2 24  BUN 11  CREATININE 0.83  GLUCOSE 219*  CALCIUM 8.6*   No results for input(s): LABPT, INR in the last 72 hours.  Well nourished. Alert and oriented x3. RRR, Lungs clear, BS x4. Abdomen soft and non tender. Right Calf soft and non tender. Right knee dressing C/D/I. No DVT signs. Compartment soft. No signs of infection.  Right LE grossly neurovascular intact.  Assessment/Plan: 1 Day Post-Op Procedure(s) (LRB): RIGHT TOTAL KNEE ARTHROPLASTY (Right) Up with PT Advance diet as tolerated Continue current care Planning for D/c Monday No drain in place Nausea: Change antiemetic to Promethazine.   STILWELL, BRYSON L 03/28/2015, 8:18 AM

## 2015-03-28 NOTE — Progress Notes (Signed)
OT Cancellation Note  Patient Details Name: Carol [name] MRN: [id number] DOB: May 26, 1956   Cancelled Treatment:    Reason Eval/Treat Not Completed: Fatigue/lethargy limiting ability to participate   Spoke with PT- pt not ready for OT this morning  REDDING, Thereasa Parkin 03/28/2015, 9:35 AM

## 2015-03-28 NOTE — Care Management Note (Signed)
Case Management Note  Patient Details  Name: Carol [name] MRN: [id number] Date of Birth: 1956-05-26  Subjective/Objective:       Right total knee arthroplasty        Action/Plan: [place] preoperatively arranged. NCM spoke to pt and offered choice. Pt agreeable to [facility] for [facility]. Pt requesting RW and shower chair (needs out of pocket cost for shower chair). Made her aware shower chair not covered by insurance. Will contact [place] DME rep on 03/29/2015 to verify coverage for shower chair.   Expected Discharge Date:  03/30/2015              Expected Discharge Plan:  [place]  In-House Referral:     Discharge planning Services  CM Consult  Post Acute Care Choice:  Home Health Choice offered to:  Patient  DME Arranged:  [facility] D[facility] Agency:     HH Arranged:  PT HH Agency:  [facility]  Status of Service:  Completed, signed off  Medicare Important Message Given:    Date Medicare IM Given:    Medicare IM give by:    Date Additional Medicare IM Given:    Additional Medicare Important Message give by:     If discussed at [place] of Stay Meetings, dates discussed:    Additional Comments:  Erenest Rasher, RN 03/28/2015, 7:16 PM

## 2015-03-28 NOTE — Progress Notes (Signed)
Physical Therapy Treatment Patient Details Name: ENDA SANTO MRN: [id number] DOB: Nov 24, 1955 Today's Date: 03/28/2015    History of Present Illness R TKA    PT Comments    Progressing slowly due to [pain and dizziness. Had IV meds prior.  Follow Up Recommendations  Home health PT;Supervision/Assistance - 24 hour     Equipment Recommendations  Rolling walker with 5" wheels    Recommendations for Other Services       Precautions / Restrictions Precautions Precautions: Knee;Fall Required Braces or Orthoses: Knee Immobilizer - Right Knee Immobilizer - Right: Discontinue once straight leg raise with < 10 degree lag    Mobility  Bed Mobility   Bed Mobility: Sit to Supine       Sit to supine: Mod assist   General bed mobility comments: assist  R leg   Transfers Overall transfer level: Needs assistance Equipment used: Rolling walker (2 wheeled) Transfers: Sit to/from Stand Sit to Stand: Mod assist;+2 physical assistance;From elevated surface;+2 safety/equipment         General transfer comment: cues for hand placement , lifting assist to power up.  Ambulation/Gait Ambulation/Gait assistance: Mod assist;+2 physical assistance;+2 safety/equipment Ambulation Distance (Feet): 5 Feet Assistive device: Rolling walker (2 wheeled) Gait Pattern/deviations: Step-to pattern     General Gait Details: c/o dizziness, assited to bed.   Stairs            Wheelchair Mobility    Modified Rankin (Stroke Patients Only)       Balance                                    Cognition Arousal/Alertness: Awake/alert                          Exercises      General Comments        Pertinent Vitals/Pain Pain Score: 7  Pain Location: R knee Pain Descriptors / Indicators: Aching Pain Intervention(s): Limited activity within patient's tolerance;Monitored during session;Premedicated before session;Repositioned;Ice applied    Home Living                       Prior Function            PT Goals (current goals can now be found in the care plan section) Progress towards PT goals: Progressing toward goals    Frequency  7X/week    PT Plan Current plan remains appropriate    Co-evaluation             End of Session Equipment Utilized During Treatment: Left knee immobilizer Activity Tolerance: Patient limited by fatigue;Patient limited by pain Patient left: in bed;with call bell/phone within reach;with bed alarm set;with family/visitor present     Time: 1141-1158 PT Time Calculation (min) (ACUTE ONLY): 17 min  Charges:  $Gait Training: 8-22 mins                    G Codes:      Claretha Cooper 03/28/2015, 5:35 PM

## 2015-03-28 NOTE — Progress Notes (Signed)
Physical Therapy Treatment Patient Details Name: Carol [name] MRN: [id number] DOB: 07-09-1956 Today's Date: 03/28/2015    History of Present Illness R TKA    PT Comments    Progressing slowly, nauseated, assisted to [facility] abd to bed. Had IV pain meds prior.  Follow Up Recommendations  Home health PT;Supervision/Assistance - 24 hour     Equipment Recommendations  Rolling walker with 5" wheels    Recommendations for Other Services       Precautions / Restrictions Precautions Precautions: Knee;Fall Required Braces or Orthoses: Knee Immobilizer - Right Knee Immobilizer - Right: Discontinue once straight leg raise with < 10 degree lag    Mobility  Bed Mobility   Bed Mobility: Supine to Sit;Sit to Supine     Supine to sit: Mod assist;+2 for safety/equipment Sit to supine: Mod assist;+2 for safety/equipment   General bed mobility comments: assist  R leg   Transfers Overall transfer level: Needs assistance Equipment used: Rolling walker (2 wheeled) Transfers: Sit to/from [facility] Sit to Stand: Mod assist Stand pivot transfers: Mod assist       General transfer comment: assist support R leg, did not use KI, pivot w/ RW ed to [facility] to bed.  Ambulation/Gait Ambulation/Gait assistance: Mod assist;+2 physical assistance;+2 safety/equipment Ambulation Distance (Feet): 5 Feet Assistive device: Rolling walker (2 wheeled) Gait Pattern/deviations: Step-to pattern     General Gait Details: c/o dizziness, assited to bed.   Stairs            Wheelchair Mobility    Modified Rankin (Stroke Patients Only)       Balance                                    Cognition Arousal/Alertness: Awake/alert                          Exercises Total Joint Exercises Ankle Circles/Pumps: AROM;Both;10 reps;Supine Quad Sets: Both;Supine Towel Squeeze: AROM;Both;10 reps;Supine Heel Slides: AAROM;Supine;10 reps;Right Hip  ABduction/ADduction: AAROM;Right;Supine;10 reps Straight Leg Raises: AAROM;Right;Supine;10 reps Goniometric ROM: 10-50 R knee    General Comments        Pertinent Vitals/Pain Pain Score: 7  Pain Location: R knee Pain Descriptors / Indicators: Aching Pain Intervention(s): Monitored during session;Repositioned;Ice applied;Premedicated before session    Home Living                      Prior Function            PT Goals (current goals can now be found in the care plan section) Progress towards PT goals: Progressing toward goals    Frequency  7X/week    PT Plan Current plan remains appropriate    Co-evaluation             End of Session Equipment Utilized During Treatment: Left knee immobilizer Activity Tolerance: Patient limited by fatigue;Patient limited by pain Patient left: in bed;with call bell/phone within reach;with bed alarm set;with family/visitor present     Time: 4696-2952 PT Time Calculation (min) (ACUTE ONLY): 28 min  Charges:  $Gait Training: 8-22 mins $Therapeutic Exercise: 8-22 mins                    G Codes:      Carol [name] 03/28/2015, 5:39 PM

## 2015-03-28 NOTE — Evaluation (Signed)
Physical Therapy Evaluation Patient Details Name: Carol [name] MRN: [id number] DOB: Dec 03, 1955 Today's Date: 03/28/2015   History of Present Illness  R TKA  Clinical Impression  Evaluation limited by nausea, had been medicated. Only able to sit at the edge of the bed. Patient will benefit from PT to address problems listed in note below.    Follow Up Recommendations Home health PT;Supervision/Assistance - 24 hour    Equipment Recommendations  Rolling walker with 5" wheels    Recommendations for Other Services       Precautions / Restrictions Precautions Precautions: Knee Required Braces or Orthoses: Knee Immobilizer - Right Knee Immobilizer - Right: Discontinue once straight leg raise with < 10 degree lag Restrictions RLE Weight Bearing: Weight bearing as tolerated      Mobility  Bed Mobility Overal bed mobility: Needs Assistance Bed Mobility: Supine to Sit;Sit to Supine     Supine to sit: Mod assist Sit to supine: Mod assist   General bed mobility comments: assist  R leg to edge,  Transfers                 General transfer comment: unable to stand due to onset of nausea/dry heaves, also very drowsy after medication given.assisted back into bed.  Ambulation/Gait                Stairs            Wheelchair Mobility    Modified Rankin (Stroke Patients Only)       Balance                                             Pertinent Vitals/Pain Pain Assessment: 0-10 Pain Score: 7  Pain Descriptors / Indicators: Aching;Sore;Discomfort;Grimacing;Throbbing Pain Intervention(s): Limited activity within patient's tolerance;Monitored during session;Premedicated before session;Repositioned    Home Living Family/patient expects to be discharged to:: Private residence Living Arrangements: Children;Spouse/significant other Available Help at Discharge: Family Type of Home: House Home Access: Stairs to enter Entrance Stairs-Rails:  Right;Left;Can reach both Technical brewer of Steps: 3 Home Layout: One level Home Equipment: None      Prior Function Level of Independence: Independent               Hand Dominance        Extremity/Trunk Assessment               Lower Extremity Assessment: RLE deficits/detail RLE Deficits / Details: performs a SLR with min assist       Communication   Communication: No difficulties  Cognition Arousal/Alertness: Awake/alert Behavior During Therapy: WFL for tasks assessed/performed Overall Cognitive Status: Within Functional Limits for tasks assessed                      General Comments      Exercises        Assessment/Plan    PT Assessment Patient needs continued PT services  PT Diagnosis Difficulty walking;Acute pain   PT Problem List Decreased strength;Decreased range of motion;Decreased activity tolerance;Decreased balance;Decreased mobility;Decreased knowledge of precautions;Decreased safety awareness;Decreased knowledge of use of DME;Pain  PT Treatment Interventions DME instruction;Gait training;Stair training;Functional mobility training;Therapeutic activities;Therapeutic exercise;Patient/family education   PT Goals (Current goals can be found in the Care Plan section) Acute Rehab PT Goals Patient Stated Goal: to get up  and walk. PT Goal Formulation: With patient/family Time  For Goal Achievement: 04/04/15    Frequency 7X/week   Barriers to discharge        Co-evaluation               End of Session   Activity Tolerance: Patient limited by lethargy;Treatment limited secondary to medical complications (Comment) (nausea) Patient left: in bed;with call bell/phone within reach;with family/visitor present Nurse Communication: Mobility status         Time: 2257-5051 PT Time Calculation (min) (ACUTE ONLY): 14 min   Charges:   PT Evaluation $Initial PT Evaluation Tier I: 1 Procedure     PT G CodesClaretha Cooper 03/28/2015, 10:19 AM

## 2015-03-28 NOTE — Discharge Instructions (Signed)
Elevate leg above heart 6x a day for 54minutes each Use knee immobilizer while walking until can SLR x 10 Use knee immobilizer in bed to keep knee in extension Aquacel dressing may remain in place until follow up. May shower with aquacel dressing in place. If the dressing becomes saturated or peels off, you may remove aquacel dressing. Do not remove steri-strips if they are present. Place new dressing with gauze and tape or ACE bandage which should be kept clean and dry and changed daily.   Information on my medicine - XARELTO (Rivaroxaban)  This medication education was reviewed with me or my healthcare representative as part of my discharge preparation.  The pharmacist that spoke with me during my hospital stay was:  Luiz Ochoa [facility]  Why was Xarelto prescribed for you? Xarelto was prescribed for you to reduce the risk of blood clots forming after orthopedic surgery. The medical term for these abnormal blood clots is venous thromboembolism (VTE).  What do you need to know about xarelto ? Take your Xarelto ONCE DAILY at the same time every day. You may take it either with or without food.  If you have difficulty swallowing the tablet whole, you may crush it and mix in applesauce just prior to taking your dose.  Take Xarelto exactly as prescribed by your doctor and DO NOT stop taking Xarelto without talking to the doctor who prescribed the medication.  Stopping without other VTE prevention medication to take the place of Xarelto may increase your risk of developing a clot.  After discharge, you should have regular check-up appointments with your healthcare provider that is prescribing your Xarelto.    What do you do if you miss a dose? If you miss a dose, take it as soon as you remember on the same day then continue your regularly scheduled once daily regimen the next day. Do not take two doses of Xarelto on the same day.   Important Safety Information A possible side effect  of Xarelto is bleeding. You should call your healthcare provider right away if you experience any of the following: ? Bleeding from an injury or your nose that does not stop. ? Unusual colored urine (red or dark brown) or unusual colored stools (red or black). ? Unusual bruising for unknown reasons. ? A serious fall or if you hit your head (even if there is no bleeding).  Some medicines may interact with Xarelto and might increase your risk of bleeding while on Xarelto. To help avoid this, consult your healthcare provider or pharmacist prior to using any new prescription or non-prescription medications, including herbals, vitamins, non-steroidal anti-inflammatory drugs (NSAIDs) and supplements.  This website has more information on Xarelto: [email or web address]

## 2015-03-28 NOTE — Plan of Care (Signed)
Problem: Phase III Progression Outcomes Goal: Anticoagulant follow-up in place Outcome: Not Applicable Date Met:  32/44/01 xarelto

## 2015-03-29 LAB — CBC
HCT: 37 % (ref 36.0–46.0)
Hemoglobin: 12 g/dL (ref 12.0–15.0)
MCH: 29.1 pg (ref 26.0–34.0)
MCHC: 32.4 g/dL (ref 30.0–36.0)
MCV: 89.6 fL (ref 78.0–100.0)
Platelets: 320 10*3/uL (ref 150–400)
RBC: 4.13 MIL/uL (ref 3.87–5.11)
RDW: 13.8 % (ref 11.5–15.5)
WBC: 14.7 10*3/uL — ABNORMAL HIGH (ref 4.0–10.5)

## 2015-03-29 MED ORDER — TRIAMCINOLONE ACETONIDE 55 MCG/ACT NA AERO
1.0000 | INHALATION_SPRAY | Freq: Every day | NASAL | Status: DC
  Administered 2015-03-29: 1 via NASAL
  Filled 2015-03-29: qty 10.8

## 2015-03-29 NOTE — Progress Notes (Signed)
Physical Therapy Treatment Patient Details Name: Carol [name] MRN: [id number] DOB: 11-23-1955 Today's Date: 03/29/2015    History of Present Illness R TKA    PT Comments    POD # 2 pm session.  Pt feeling a little better.  Still nausea with activity but tolerable.  Assisted OOB and practiced stairs with spouse.  Returned to bed and applied ICE.  Pt would like to go home now.  BP's Supine 155/86 EOB 168/80 Standing 144/88 After amb 5 feet 141/72  Follow Up Recommendations        Equipment Recommendations       Recommendations for Other Services       Precautions / Restrictions Precautions Precautions: Knee;Fall Precaution Comments: pt aware to yuse KI for sleeping and walking/stairs Required Braces or Orthoses: Knee Immobilizer - Right Knee Immobilizer - Right: Discontinue once straight leg raise with < 10 degree lag Restrictions Weight Bearing Restrictions: No RLE Weight Bearing: Weight bearing as tolerated    Mobility  Bed Mobility      Min assist to supoort R LE            Transfers        MinGuard assist for safety with RW            Ambulation/Gait        MinGuard Assist RW with spouse 35 feet         Stairs Stairs: Yes Stairs assistance: Min guard Stair Management: Two rails;Forwards;Step to pattern Number of Stairs: 2 General stair comments: with spouse performed 2 steps forward using B rails and 25% VC's on proper tech  Wheelchair Mobility    Modified Rankin (Stroke Patients Only)       Balance                                    Cognition                            Exercises      General Comments        Pertinent Vitals/Pain      Home Living                      Prior Function            PT Goals (current goals can now be found in the care plan section)      Frequency       PT Plan      Co-evaluation             End of Session           Time:  1410-1435 PT Time Calculation (min) (ACUTE ONLY): 25 min  Charges:  $Gait Training: 8-22 mins $Therapeutic Activity: 8-22 mins                    G Codes:      [name]  PTA [facility]  Acute  Rehab Pager      [phone]

## 2015-03-29 NOTE — Progress Notes (Signed)
Contacted [facility] DME rep for equipment for scheduled dc home today. See previous NCM notes. Jonnie Finner RN CCM Case Mgmt phone [phone]

## 2015-03-29 NOTE — Progress Notes (Signed)
OT Cancellation Note  Patient Details Name: Carol [name] MRN: [id number] DOB: 01/07/1956   Cancelled Treatment:    Reason Eval/Treat Not Completed: Other (comment)  Pt states that family member has been taking her to the bathroom. She was not feeling well this am.  Noted that pt did not tolerate much with PT this am.  If she is still here tomorrow, we will check back for needs.  SPENCER,MARYELLEN 03/29/2015, 10:40 AM  Lesle Chris, OTR/L [phone] 03/29/2015

## 2015-03-29 NOTE — Plan of Care (Signed)
Problem: Discharge Progression Outcomes Goal: Anticoagulant follow-up in place Outcome: Not Applicable Date Met:  03/29/15 xarelto     

## 2015-03-29 NOTE — Progress Notes (Signed)
Subjective: 2 Days Post-Op Procedure(s) (LRB): RIGHT TOTAL KNEE ARTHROPLASTY (Right)  Patient reports pain as mild to moderate.   Tolerating POs.  Notes that she continues to experience mild N, however it is tolerable and this is common for her after post-op procedures.  Denies BM, but admits to flatulence.  Reports that she is ready to go home.  Asking for nasal spray due to sinus congestion.  Objective:   VITALS:  Temp:  [97.9 F (36.6 C)-99.4 F (37.4 C)] 98.5 F (36.9 C) (09/04 0610) Pulse Rate:  [81-112] 94 (09/04 0610) Resp:  [16-18] 16 (09/04 0610) BP: (122-136)/(53-108) 136/53 mmHg (09/04 0610) SpO2:  [91 %-100 %] 91 % (09/04 0610)  General: WDWN patient in NAD. Psych:  Appropriate mood and affect. Neuro:  A&O x 3, Moving all extremities, sensation intact to light touch HEENT:  EOMs intact Chest:  Even non-labored respirations Skin:  Dressing C/D/I Extremities: warm/dry, mild edema, no erythmea or echymosis.  No lymphadenopathy. Pulses: Dorsalis pedis and post tibialis 2+ MSK:  ROM:  Full Ankle ROM, MMT: can perform quad set, (-) Homan's    LABS  Recent Labs  03/28/15 0420 03/29/15 0539  HGB 12.8 12.0  WBC 20.8* 14.7*  PLT 372 320    Recent Labs  03/28/15 0420  NA 129*  K 4.4  CL 96*  CO2 24  BUN 11  CREATININE 0.83  GLUCOSE 219*   No results for input(s): LABPT, INR in the last 72 hours.   Assessment/Plan: 2 Days Post-Op Procedure(s) (LRB): RIGHT TOTAL KNEE ARTHROPLASTY (Right)  Discharge home with home health F/u with Dr. Tonita Cong in office in 2 weeks. Nasacort for sinus congestion   Mechele Claude, PA-C, [facility] Office:  [phone]

## 2015-03-29 NOTE — Progress Notes (Signed)
Physical Therapy Treatment Patient Details Name: Carol [name] MRN: [id number] DOB: Jul 25, 1956 Today's Date: 03/29/2015    History of Present Illness R TKA    PT Comments    POD # 2 am session.  Pt feeling "bad" with MAX c/o dizziness during amb.  Unable to attempt stairs.  Reported to RN. BP's supine 136/53 EOB         118/80 Standing > 2 min 104/59  Reported to RN.  Encouraged fluids and will return later to for another therapy session.   Follow Up Recommendations  Home health PT;Supervision/Assistance - 24 hour     Equipment Recommendations  Rolling walker with 5" wheels    Recommendations for Other Services       Precautions / Restrictions Precautions Precautions: Knee;Fall Precaution Comments: pt aware to yuse KI for sleeping and walking/stairs Knee Immobilizer - Right: Discontinue once straight leg raise with < 10 degree lag Restrictions Weight Bearing Restrictions: No RLE Weight Bearing: Weight bearing as tolerated    Mobility  Bed Mobility Overal bed mobility: Needs Assistance Bed Mobility: Supine to Sit     Supine to sit: Min assist     General bed mobility comments: assist  R leg and increased time.  Transfers Overall transfer level: Needs assistance Equipment used: Rolling walker (2 wheeled) Transfers: Sit to/from Stand Sit to Stand: Min assist         General transfer comment: increased time and support R LE with stand to sit  Ambulation/Gait Ambulation/Gait assistance: Min assist;+2 safety/equipment Ambulation Distance (Feet): 5 Feet Assistive device: Rolling walker (2 wheeled) Gait Pattern/deviations: Step-to pattern;Decreased stance time - right;Decreased step length - left;Decreased step length - right     General Gait Details: max c/o dizziness.  Limited amb distance and unable to attempt stairs.     Stairs            Wheelchair Mobility    Modified Rankin (Stroke Patients Only)       Balance                                     Cognition                            Exercises      General Comments        Pertinent Vitals/Pain Pain Assessment: 0-10 Pain Score: 7  Pain Location: R knee and a bad headache Pain Descriptors / Indicators: Aching Pain Intervention(s): Monitored during session;Premedicated before session;Repositioned;Ice applied    Home Living                      Prior Function            PT Goals (current goals can now be found in the care plan section) Progress towards PT goals: Progressing toward goals    Frequency  7X/week    PT Plan      Co-evaluation             End of Session Equipment Utilized During Treatment: Left knee immobilizer Activity Tolerance: Treatment limited secondary to medical complications (Comment) (dizziness/orthostatic) Patient left: in chair;with call bell/phone within reach;with family/visitor present     Time: 0910-0925 PT Time Calculation (min) (ACUTE ONLY): 15 min  Charges:  $Gait Training: 8-22 mins  G Codes:      Carol [name]  PTA [facility]  Acute  Rehab Pager      [phone]

## 2015-03-29 NOTE — Progress Notes (Signed)
Discharged from floor via w/c, belongings & spouse with pt. No changes in assessment. Council, [place]  

## 2015-03-29 NOTE — Clinical Social Work Note (Signed)
CSW met with pt at bedside to discuss discharge plans  CSW explained role and prompted pt to discuss her current needs with regards to discharge.  Pt stated that she wanted to go home with HH/PT.  Refused SNF  CSW referred to RN/case manager for in home services  CSW signing off  .Dede Query, LCSW [facility] Clinical Social Worker - Weekend Coverage cell #: [phone]

## 2015-03-29 NOTE — Progress Notes (Signed)
Utilization Review Completed.Dowell, Deborah T9/10/2014  

## 2015-03-30 NOTE — Discharge Summary (Signed)
Physician Discharge Summary   Patient ID: Carol [name] MRN: [id number] DOB/AGE: 58-28-1957 58 y.o.  Admit date: 03/27/2015 Discharge date: 03/29/2015  Primary Diagnosis: right knee DJD  Admission Diagnoses:  Past Medical History  Diagnosis Date  . Asthma   . Thyroid disease   . Complication of anesthesia     pt states has difficulty with being put to sleep   . PONV (postoperative nausea and vomiting)   . Family history of adverse reaction to anesthesia     pts daughter has N&V  . Polycythemia   . Shortness of breath dyspnea     humidity; walking   . History of bronchitis   . Pneumonia     hx of   . Hypothyroidism   . Anxiety   . History of kidney stones   . History of frequent urinary tract infections   . Arthritis   . History of blood transfusion     2006  . Tinnitus   . Falls   . History of chicken pox   . History of measles as a child   . History of mumps as a child   . Hemorrhoids   . History of jaundice   . History of gallstones   . History of blood clots   . Insomnia   . Night sweats   . Fatigue   . Balance problems    Discharge Diagnoses:   Principal Problem:   Primary osteoarthritis of right knee Active Problems:   Right knee DJD  Estimated body mass index is 38.76 kg/(m^2) as calculated from the following:   Height as of this encounter: '5\' 6"'  (1.676 m).   Weight as of this encounter: 108.863 kg (240 lb).  Procedure:  Procedure(s) (LRB): RIGHT TOTAL KNEE ARTHROPLASTY (Right)   Consults: None  HPI: see H&P Laboratory Data: Admission on 03/27/2015, Discharged on 03/29/2015  Component Date Value Ref Range Status  . WBC 03/28/2015 20.8* 4.0 - 10.5 K/uL Final  . RBC 03/28/2015 4.45  3.87 - 5.11 MIL/uL Final  . Hemoglobin 03/28/2015 12.8  12.0 - 15.0 g/dL Final  . HCT 03/28/2015 39.9  36.0 - 46.0 % Final  . MCV 03/28/2015 89.7  78.0 - 100.0 fL Final  . MCH 03/28/2015 28.8  26.0 - 34.0 pg Final  . MCHC 03/28/2015 32.1  30.0 - 36.0 g/dL Final  .  RDW 03/28/2015 13.6  11.5 - 15.5 % Final  . Platelets 03/28/2015 372  150 - 400 K/uL Final  . Sodium 03/28/2015 129* 135 - 145 mmol/L Final  . Potassium 03/28/2015 4.4  3.5 - 5.1 mmol/L Final  . Chloride 03/28/2015 96* 101 - 111 mmol/L Final  . CO2 03/28/2015 24  22 - 32 mmol/L Final  . Glucose, Bld 03/28/2015 219* 65 - 99 mg/dL Final  . BUN 03/28/2015 11  6 - 20 mg/dL Final  . Creatinine, Ser 03/28/2015 0.83  0.44 - 1.00 mg/dL Final  . Calcium 03/28/2015 8.6* 8.9 - 10.3 mg/dL Final  . GFR calc non Af Amer 03/28/2015 >60  >60 mL/min Final  . GFR calc Af Amer 03/28/2015 >60  >60 mL/min Final   Comment: (NOTE) The eGFR has been calculated using the CKD EPI equation. This calculation has not been validated in all clinical situations. eGFR's persistently <60 mL/min signify possible Chronic Kidney Disease.   . Anion gap 03/28/2015 9  5 - 15 Final  . WBC 03/29/2015 14.7* 4.0 - 10.5 K/uL Final  . RBC 03/29/2015 4.13  3.87 -  5.11 MIL/uL Final  . Hemoglobin 03/29/2015 12.0  12.0 - 15.0 g/dL Final  . HCT 03/29/2015 37.0  36.0 - 46.0 % Final  . MCV 03/29/2015 89.6  78.0 - 100.0 fL Final  . MCH 03/29/2015 29.1  26.0 - 34.0 pg Final  . MCHC 03/29/2015 32.4  30.0 - 36.0 g/dL Final  . RDW 03/29/2015 13.8  11.5 - 15.5 % Final  . Platelets 03/29/2015 320  150 - 400 K/uL Final  Hospital Outpatient Visit on 03/19/2015  Component Date Value Ref Range Status  . ABO/RH(D) 03/19/2015 O POS   Final  Hospital Outpatient Visit on 03/19/2015  Component Date Value Ref Range Status  . WBC 03/19/2015 11.7* 4.0 - 10.5 K/uL Final  . RBC 03/19/2015 5.13* 3.87 - 5.11 MIL/uL Final  . Hemoglobin 03/19/2015 15.0  12.0 - 15.0 g/dL Final  . HCT 03/19/2015 46.4* 36.0 - 46.0 % Final  . MCV 03/19/2015 90.4  78.0 - 100.0 fL Final  . MCH 03/19/2015 29.2  26.0 - 34.0 pg Final  . MCHC 03/19/2015 32.3  30.0 - 36.0 g/dL Final  . RDW 03/19/2015 13.9  11.5 - 15.5 % Final  . Platelets 03/19/2015 393  150 - 400 K/uL Final  .  Sodium 03/19/2015 141  135 - 145 mmol/L Final  . Potassium 03/19/2015 4.7  3.5 - 5.1 mmol/L Final  . Chloride 03/19/2015 104  101 - 111 mmol/L Final  . CO2 03/19/2015 28  22 - 32 mmol/L Final  . Glucose, Bld 03/19/2015 102* 65 - 99 mg/dL Final  . BUN 03/19/2015 18  6 - 20 mg/dL Final  . Creatinine, Ser 03/19/2015 0.80  0.44 - 1.00 mg/dL Final  . Calcium 03/19/2015 9.7  8.9 - 10.3 mg/dL Final  . Total Protein 03/19/2015 8.0  6.5 - 8.1 g/dL Final  . Albumin 03/19/2015 4.4  3.5 - 5.0 g/dL Final  . AST 03/19/2015 22  15 - 41 U/L Final  . ALT 03/19/2015 20  14 - 54 U/L Final  . Alkaline Phosphatase 03/19/2015 126  38 - 126 U/L Final  . Total Bilirubin 03/19/2015 0.6  0.3 - 1.2 mg/dL Final  . GFR calc non Af Amer 03/19/2015 >60  >60 mL/min Final  . GFR calc Af Amer 03/19/2015 >60  >60 mL/min Final   Comment: (NOTE) The eGFR has been calculated using the CKD EPI equation. This calculation has not been validated in all clinical situations. eGFR's persistently <60 mL/min signify possible Chronic Kidney Disease.   . Anion gap 03/19/2015 9  5 - 15 Final  . Prothrombin Time 03/19/2015 12.9  11.6 - 15.2 seconds Final  . INR 03/19/2015 0.96  0.00 - 1.49 Final  . ABO/RH(D) 03/19/2015 O POS   Final  . Antibody Screen 03/19/2015 NEG   Final  . Sample Expiration 03/19/2015 03/30/2015   Final  . aPTT 03/19/2015 31  24 - 37 seconds Final  . MRSA, PCR 03/19/2015 NEGATIVE  NEGATIVE Final  . Staphylococcus aureus 03/19/2015 POSITIVE* NEGATIVE Final   Comment:        The Xpert SA Assay (FDA approved for NASAL specimens in patients over 5 years of age), is one component of a comprehensive surveillance program.  Test performance has been validated by [facility] for patients greater than or equal to 67 year old. It is not intended to diagnose infection nor to guide or monitor treatment.      X-Rays:Dg Knee 1-2 Views Right  03/19/2015   CLINICAL DATA:  Preoperative exam for right total knee  joint replacement  EXAM: RIGHT KNEE - 1-2 VIEW  COMPARISON:  Right tibia and fibula of August 22, 2009  FINDINGS: The bones are adequately mineralized. There is mild narrowing of the medial joint compartment. There is beaking of the medial tibial spine. The proximal fibula is intact. There are spurs arising from the superior and inferior articular margins of the patella. There is no joint effusion.  IMPRESSION: There is mild medial joint space loss due to osteoarthritic change. There is no acute bony abnormality.   Electronically Signed   By: David  Martinique M.D.   On: 03/19/2015 14:39   Dg Knee Right Port  03/27/2015   CLINICAL DATA:  Postop right knee arthroplasty.  Initial encounter.  EXAM: PORTABLE RIGHT KNEE - 1-2 VIEW  COMPARISON:  03/19/2015.  FINDINGS: Patient is status post interval total knee arthroplasty. The hardware appears well positioned. There is no evidence of acute fracture or dislocation. There is a small amount of air within the joint and anterior soft tissues. Anterior skin staples are noted.  IMPRESSION: No demonstrated complication following right total knee arthroplasty.   Electronically Signed   By: Richardean Sale M.D.   On: 03/27/2015 16:49    EKG: Orders placed or performed during the hospital encounter of 01/22/15  . ED EKG  . ED EKG     Hospital Course: JANIKA JEDLICKA is a 59 y.o. who was admitted to [facility]. They were brought to the operating room on 03/27/2015 and underwent Procedure(s): RIGHT TOTAL KNEE ARTHROPLASTY.  Patient tolerated the procedure well and was later transferred to the recovery room and then to the orthopaedic floor for postoperative care.  They were given PO and IV analgesics for pain control following their surgery.  They were given 24 hours of postoperative antibiotics of  Anti-infectives    Start     Dose/Rate Route Frequency Ordered Stop   03/27/15 2000  ceFAZolin (ANCEF) IVPB 2 g/50 mL premix     2 g 100 mL/hr over 30 Minutes  Intravenous Every 6 hours 03/27/15 1725 03/28/15 0846   03/27/15 1455  polymyxin B 500,000 Units, bacitracin 50,000 Units in sodium chloride irrigation 0.9 % 500 mL irrigation  Status:  Discontinued       As needed 03/27/15 1455 03/27/15 1627   03/27/15 1048  clindamycin (CLEOCIN) IVPB 900 mg     900 mg 100 mL/hr over 30 Minutes Intravenous On call to O.R. 03/27/15 1048 03/27/15 1430   03/27/15 1047  ceFAZolin (ANCEF) IVPB 2 g/50 mL premix     2 g 100 mL/hr over 30 Minutes Intravenous On call to O.R. 03/27/15 1047 03/27/15 1420     and started on DVT prophylaxis in the form of Xarelto, TED hose and SCDs.   PT and OT were ordered for total joint protocol.  Discharge planning consulted to help with postop disposition and equipment needs.  Patient had a fair night on the evening of surgery.  They started to get up OOB with therapy on day one. Hemovac drain was pulled without difficulty.  Continued to work with therapy into day two. By day two, the patient had progressed with therapy and meeting their goals.  Incision was healing well.  Patient was seen in rounds and was ready to go home.   Diet: Regular diet Activity:WBAT Follow-up:in 10-14 days Disposition - Home Discharged Condition: good   Discharge Instructions    Call MD / Call 911  Complete by:  As directed   If you experience chest pain or shortness of breath, CALL 911 and be transported to the hospital emergency room.  If you develope a fever above 101 F, pus (white drainage) or increased drainage or redness at the wound, or calf pain, call your surgeon's office.     Constipation Prevention    Complete by:  As directed   Drink plenty of fluids.  Prune juice may be helpful.  You may use a stool softener, such as Colace (over the counter) 100 mg twice a day.  Use MiraLax (over the counter) for constipation as needed.     Diet - low sodium heart healthy    Complete by:  As directed      Increase activity slowly as tolerated     Complete by:  As directed      Partial weight bearing    Complete by:  As directed   Laterality:  right  Extremity:  Lower            Medication List    STOP taking these medications        BIOTIN PO     hydrocortisone 2.5 % rectal cream  Commonly known as:  ANUSOL-HC     oxycodone 5 MG capsule  Commonly known as:  OXY-IR  Replaced by:  oxyCODONE 5 MG immediate release tablet      TAKE these medications        albuterol 108 (90 BASE) MCG/ACT inhaler  Commonly known as:  PROVENTIL HFA;VENTOLIN HFA  Inhale 1 puff into the lungs every 6 (six) hours as needed for wheezing or shortness of breath.     ALPRAZolam 0.5 MG tablet  Commonly known as:  XANAX  Take 0.5 mg by mouth at bedtime as needed for anxiety.     buPROPion 100 MG tablet  Commonly known as:  WELLBUTRIN  Take 100 mg by mouth every morning.     levothyroxine 125 MCG tablet  Commonly known as:  SYNTHROID, LEVOTHROID  Take 100 mcg by mouth daily before breakfast.     methocarbamol 500 MG tablet  Commonly known as:  ROBAXIN  Take 1 tablet (500 mg total) by mouth 3 (three) times daily.     oxyCODONE 5 MG immediate release tablet  Commonly known as:  Oxy IR/ROXICODONE  1-2 po q4-6 prn pain     rivaroxaban 10 MG Tabs tablet  Commonly known as:  XARELTO  Take 1 tablet (10 mg total) by mouth daily.     vitamin B-12 1000 MCG tablet  Commonly known as:  CYANOCOBALAMIN  Take 1,000 mcg by mouth daily.     vitamin C 500 MG tablet  Commonly known as:  ASCORBIC ACID  Take 500 mg by mouth daily.           Follow-up Information    Follow up with BEANE,JEFFREY C, MD In 2 weeks.   Specialty:  Orthopedic Surgery   Why:  For suture removal   Contact information:   [address] [place] [address] [phone]       Follow up with Johnn Hai, MD In 2 weeks.   Specialty:  Orthopedic Surgery   Contact information:   [address] [place]  [address] [phone]       Follow up with [facility].   Why:  Home Health Physical Therapy   Contact information:   [place] [address]  [phone]       Follow up with BEANE,JEFFREY C, MD. Schedule an appointment as soon as possible for a visit in 2 weeks.   Specialty:  Orthopedic Surgery   Contact information:   [address] [place] [address] [phone]       Signed: Lacie Draft, PA-C Orthopaedic Surgery 03/30/2015, 10:00 PM

## 2015-03-31 ENCOUNTER — Encounter (HOSPITAL_COMMUNITY): Payer: Self-pay | Admitting: Specialist

## 2015-06-25 ENCOUNTER — Institutional Professional Consult (permissible substitution): Payer: Medicare Other | Admitting: Neurology

## 2015-07-07 ENCOUNTER — Institutional Professional Consult (permissible substitution): Payer: Medicare Other | Admitting: Neurology

## 2015-08-04 ENCOUNTER — Institutional Professional Consult (permissible substitution): Payer: Medicare Other | Admitting: Neurology

## 2015-08-11 DIAGNOSIS — Z471 Aftercare following joint replacement surgery: Secondary | ICD-10-CM | POA: Diagnosis not present

## 2015-08-11 DIAGNOSIS — Z96651 Presence of right artificial knee joint: Secondary | ICD-10-CM | POA: Diagnosis not present

## 2015-08-11 DIAGNOSIS — M1712 Unilateral primary osteoarthritis, left knee: Secondary | ICD-10-CM | POA: Diagnosis not present

## 2015-08-20 ENCOUNTER — Institutional Professional Consult (permissible substitution): Payer: Medicare Other | Admitting: Neurology

## 2015-08-20 ENCOUNTER — Telehealth: Payer: Self-pay

## 2015-08-20 NOTE — Telephone Encounter (Signed)
Pt did not show for their appt with Dr. Dohmeier today.  

## 2015-08-21 ENCOUNTER — Encounter: Payer: Self-pay | Admitting: Neurology

## 2015-09-11 DIAGNOSIS — R55 Syncope and collapse: Secondary | ICD-10-CM | POA: Diagnosis not present

## 2015-09-11 DIAGNOSIS — Z6838 Body mass index (BMI) 38.0-38.9, adult: Secondary | ICD-10-CM | POA: Diagnosis not present

## 2015-09-11 DIAGNOSIS — F329 Major depressive disorder, single episode, unspecified: Secondary | ICD-10-CM | POA: Diagnosis not present

## 2015-09-11 DIAGNOSIS — E161 Other hypoglycemia: Secondary | ICD-10-CM | POA: Diagnosis not present

## 2015-09-11 DIAGNOSIS — J45909 Unspecified asthma, uncomplicated: Secondary | ICD-10-CM | POA: Diagnosis not present

## 2015-09-11 DIAGNOSIS — L989 Disorder of the skin and subcutaneous tissue, unspecified: Secondary | ICD-10-CM | POA: Diagnosis not present

## 2015-09-17 ENCOUNTER — Encounter: Payer: Self-pay | Admitting: Internal Medicine

## 2015-09-17 ENCOUNTER — Ambulatory Visit (INDEPENDENT_AMBULATORY_CARE_PROVIDER_SITE_OTHER): Payer: PPO | Admitting: Internal Medicine

## 2015-09-17 VITALS — BP 132/84 | HR 88 | Ht 66.0 in | Wt 240.4 lb

## 2015-09-17 DIAGNOSIS — R55 Syncope and collapse: Secondary | ICD-10-CM

## 2015-09-17 DIAGNOSIS — R0602 Shortness of breath: Secondary | ICD-10-CM | POA: Diagnosis not present

## 2015-09-17 DIAGNOSIS — R002 Palpitations: Secondary | ICD-10-CM

## 2015-09-17 NOTE — Patient Instructions (Signed)
Medication Instructions:  Your physician recommends that you continue on your current medications as directed. Please refer to the Current Medication list given to you today.   Labwork: None Ordered   Testing/Procedures: Your physician has recommended that you wear an event monitor. Event monitors are medical devices that record the heart's electrical activity. Doctors most often [place] these monitors to diagnose arrhythmias. Arrhythmias are problems with the speed or rhythm of the heartbeat. The monitor is a small, portable device. You can wear one while you do your normal daily activities. This is usually used to diagnose what is causing palpitations/syncope (passing out).  Your physician has requested that you have an echocardiogram. Echocardiography is a painless test that uses sound waves to create images of your heart. It provides your doctor with information about the size and shape of your heart and how well your heart's chambers and valves are working. This procedure takes approximately one hour. There are no restrictions for this procedure.   Follow-Up: Your physician recommends that you schedule a follow-up appointment in: 8 weeks with Dr. Lovena Le (after 30 day event monitor and ECHO are complete)   If you need a refill on your cardiac medications before your next appointment, please call your pharmacy.

## 2015-09-17 NOTE — Progress Notes (Signed)
HPI Carol [name] is referred today by Dr. Philip Aspen for evaluation of near syncopal spells. She has never passed out completely. She has a host of medical problems including chronic diarrhea and depression, difficulty sleeping, arthritis, and chronic low back pain. She notes that she was in the kitchen, and felt as if she was going to pass out. She notes that her heart was pounding. She laid down. She tried to drink and vomited. She noted the urge to vomit. The spell finally subsided after several minutes. Her blood sugar was low. Remotely she notes that she had an episode of syncope in 2000. A Medtronic reveal was placed at that time by Dr. Radford Pax but got infected and had to be removed. No obvious diagnosis was made. Allergies  Allergen Reactions  . Cymbalta [Duloxetine Hcl] Rash  . Naproxen Rash  . Acetaminophen Nausea And Vomiting  . Celebrex [Celecoxib] Itching  . Povidone Iodine Hives  . Codeine Rash  . Doxycycline Rash     Current Outpatient Prescriptions  Medication Sig Dispense Refill  . albuterol (PROVENTIL HFA;VENTOLIN HFA) 108 (90 BASE) MCG/ACT inhaler Inhale 1 puff into the lungs every 6 (six) hours as needed for wheezing or shortness of breath.    . ALPRAZolam (XANAX) 0.5 MG tablet Take 0.5 mg by mouth at bedtime as needed for anxiety.    . cephALEXin (KEFLEX) 500 MG capsule Take 500 mg by mouth. Pt taking 1 tablet for 4 days til she get her teeth clean    . gabapentin (NEURONTIN) 300 MG capsule Take 300 mg by mouth 2 (two) times daily.  3  . levothyroxine (SYNTHROID, LEVOTHROID) 125 MCG tablet Take 112 mcg by mouth daily before breakfast.     . vitamin B-12 (CYANOCOBALAMIN) 1000 MCG tablet Take 1,000 mcg by mouth daily.    . vitamin C (ASCORBIC ACID) 500 MG tablet Take 500 mg by mouth daily.     No current facility-administered medications for this visit.     Past Medical History  Diagnosis Date  . Asthma   . Thyroid disease   . Complication of anesthesia     pt  states has difficulty with being put to sleep   . PONV (postoperative nausea and vomiting)   . Family history of adverse reaction to anesthesia     pts daughter has N&V  . Polycythemia   . Shortness of breath dyspnea     humidity; walking   . History of bronchitis   . Pneumonia     hx of   . Hypothyroidism   . Anxiety   . History of kidney stones   . History of frequent urinary tract infections   . Arthritis   . History of blood transfusion     2006  . Tinnitus   . Falls   . History of chicken pox   . History of measles as a child   . History of mumps as a child   . Hemorrhoids   . History of jaundice   . History of gallstones   . History of blood clots   . Insomnia   . Night sweats   . Fatigue   . Balance problems     ROS:   All systems reviewed and negative except as noted in the HPI.   Past Surgical History  Procedure Laterality Date  . Back surgery    . Cholecystectomy    . Abdominal hysterectomy    . Knee surgery    .  Tonsillectomy    . Left foot surgery       to repair break - 2002  . Menicus tear      bilat;   . Total knee arthroplasty Right 03/27/2015    Procedure: RIGHT TOTAL KNEE ARTHROPLASTY;  Surgeon: Susa Day, MD;  Location: [facility];  Service: Orthopedics;  Laterality: Right;     Family History  Problem Relation Age of Onset  . Hypertension Mother   . Heart attack Father 68     Social History   Social History  . Marital Status: Married    Spouse Name: N/A  . Number of Children: N/A  . Years of Education: N/A   Occupational History  . Not on file.   Social History Main Topics  . Smoking status: Never Smoker   . Smokeless tobacco: Never Used  . Alcohol Use: No  . Drug Use: No  . Sexual Activity: Not on file   Other Topics Concern  . Not on file   Social History Narrative     BP 132/84 mmHg  Pulse 88  Ht 5\' 6"  (1.676 m)  Wt 240 lb 6.4 oz (109.045 kg)  BMI 38.82 kg/m2  Physical Exam:  Well appearing 60 yo woman,  NAD HEENT: Unremarkable Neck:  No JVD, no thyromegally Lymphatics:  No adenopathy Back:  No CVA tenderness Lungs:  Clear with no wheezes HEART:  Regular rate rhythm, no murmurs, no rubs, no clicks Abd:  soft, positive bowel sounds, no organomegally, no rebound, no guarding Ext:  2 plus pulses, no edema, no cyanosis, no clubbing Skin:  No rashes no nodules Neuro:  CN II through XII intact, motor grossly intact  EKG - reviewed, nsr with a short PR but no pre-excitation  Assess/Plan: 1. Remote syncope, now with near syncope - the etiology of her episode is unclear and does not fit into any clear cut pattern. She may have autonomic dysfunction. She could also have symptomatic atrial fib with a RVR. I have recommended she undergo heart monitoring, looking for cardiac arrhythmias including atrial fib. I have also recommended she undergo 2D echo. Additional recommendations will follow based on the results of her heart monitor. 2. Chest pain - her symptoms are not related to exertion. For now she will undergo watchful waiting. 3. Obesity - she needs to lose weight.   Mikle Bosworth.D.

## 2015-09-21 ENCOUNTER — Ambulatory Visit (INDEPENDENT_AMBULATORY_CARE_PROVIDER_SITE_OTHER): Payer: PPO

## 2015-09-21 ENCOUNTER — Ambulatory Visit (HOSPITAL_COMMUNITY): Payer: PPO | Attending: Internal Medicine

## 2015-09-21 ENCOUNTER — Other Ambulatory Visit: Payer: Self-pay

## 2015-09-21 DIAGNOSIS — I517 Cardiomegaly: Secondary | ICD-10-CM | POA: Diagnosis not present

## 2015-09-21 DIAGNOSIS — R002 Palpitations: Secondary | ICD-10-CM | POA: Insufficient documentation

## 2015-09-21 DIAGNOSIS — Z8249 Family history of ischemic heart disease and other diseases of the circulatory system: Secondary | ICD-10-CM | POA: Insufficient documentation

## 2015-09-21 DIAGNOSIS — R55 Syncope and collapse: Secondary | ICD-10-CM

## 2015-09-21 DIAGNOSIS — R0602 Shortness of breath: Secondary | ICD-10-CM | POA: Diagnosis not present

## 2015-09-22 DIAGNOSIS — F331 Major depressive disorder, recurrent, moderate: Secondary | ICD-10-CM | POA: Diagnosis not present

## 2015-09-22 DIAGNOSIS — Z471 Aftercare following joint replacement surgery: Secondary | ICD-10-CM | POA: Diagnosis not present

## 2015-09-22 DIAGNOSIS — Z96651 Presence of right artificial knee joint: Secondary | ICD-10-CM | POA: Diagnosis not present

## 2015-09-30 DIAGNOSIS — F331 Major depressive disorder, recurrent, moderate: Secondary | ICD-10-CM | POA: Diagnosis not present

## 2015-10-02 DIAGNOSIS — H5203 Hypermetropia, bilateral: Secondary | ICD-10-CM | POA: Diagnosis not present

## 2015-10-02 DIAGNOSIS — H524 Presbyopia: Secondary | ICD-10-CM | POA: Diagnosis not present

## 2015-10-06 DIAGNOSIS — F331 Major depressive disorder, recurrent, moderate: Secondary | ICD-10-CM | POA: Diagnosis not present

## 2015-10-15 DIAGNOSIS — M25561 Pain in right knee: Secondary | ICD-10-CM | POA: Diagnosis not present

## 2015-10-20 DIAGNOSIS — F331 Major depressive disorder, recurrent, moderate: Secondary | ICD-10-CM | POA: Diagnosis not present

## 2015-11-03 DIAGNOSIS — M5137 Other intervertebral disc degeneration, lumbosacral region: Secondary | ICD-10-CM | POA: Diagnosis not present

## 2015-11-03 DIAGNOSIS — M25561 Pain in right knee: Secondary | ICD-10-CM | POA: Diagnosis not present

## 2015-11-03 DIAGNOSIS — M961 Postlaminectomy syndrome, not elsewhere classified: Secondary | ICD-10-CM | POA: Diagnosis not present

## 2015-11-03 DIAGNOSIS — S76111S Strain of right quadriceps muscle, fascia and tendon, sequela: Secondary | ICD-10-CM | POA: Diagnosis not present

## 2015-11-05 DIAGNOSIS — Z6838 Body mass index (BMI) 38.0-38.9, adult: Secondary | ICD-10-CM | POA: Diagnosis not present

## 2015-11-05 DIAGNOSIS — J302 Other seasonal allergic rhinitis: Secondary | ICD-10-CM | POA: Diagnosis not present

## 2015-11-05 DIAGNOSIS — L989 Disorder of the skin and subcutaneous tissue, unspecified: Secondary | ICD-10-CM | POA: Diagnosis not present

## 2015-11-05 DIAGNOSIS — R5383 Other fatigue: Secondary | ICD-10-CM | POA: Diagnosis not present

## 2015-11-09 DIAGNOSIS — F331 Major depressive disorder, recurrent, moderate: Secondary | ICD-10-CM | POA: Diagnosis not present

## 2015-11-14 DIAGNOSIS — M545 Low back pain: Secondary | ICD-10-CM | POA: Diagnosis not present

## 2015-11-19 DIAGNOSIS — Z96651 Presence of right artificial knee joint: Secondary | ICD-10-CM | POA: Diagnosis not present

## 2015-11-19 DIAGNOSIS — M545 Low back pain: Secondary | ICD-10-CM | POA: Diagnosis not present

## 2015-11-19 DIAGNOSIS — S76111S Strain of right quadriceps muscle, fascia and tendon, sequela: Secondary | ICD-10-CM | POA: Diagnosis not present

## 2015-11-23 DIAGNOSIS — M1811 Unilateral primary osteoarthritis of first carpometacarpal joint, right hand: Secondary | ICD-10-CM | POA: Diagnosis not present

## 2015-11-24 ENCOUNTER — Ambulatory Visit: Payer: PPO | Admitting: Internal Medicine

## 2015-12-29 ENCOUNTER — Ambulatory Visit: Payer: Self-pay | Admitting: Orthopedic Surgery

## 2015-12-29 NOTE — H&P (Signed)
Carol [name] is an 60 y.o. female.   Chief Complaint: R knee pain HPI: The patient is a 60 year old female being followed for their right knee pain. They are now 7 month(s) out from total knee arthroplasty. Symptoms reported today include: pain.  She reports two weeks ago she just started having just some grinding and pain in her knees. She reported that the burning on the anterior aspect of the knee has resolved.  This has only been for two weeks.  Review of systems is negative for fevers, chest pain, shortness of breath, unexplained recent weight loss, loss of bowel or bladder function, burning with urination, joint swelling, rashes, weakness or numbness, difficulty with balance, easy bruising, excessive thirst or frequent urination.  Past Medical History  Diagnosis Date  . Asthma   . Thyroid disease   . Complication of anesthesia     pt states has difficulty with being put to sleep   . PONV (postoperative nausea and vomiting)   . Family history of adverse reaction to anesthesia     pts daughter has N&V  . Polycythemia   . Shortness of breath dyspnea     humidity; walking   . History of bronchitis   . Pneumonia     hx of   . Hypothyroidism   . Anxiety   . History of kidney stones   . History of frequent urinary tract infections   . Arthritis   . History of blood transfusion     2006  . Tinnitus   . Falls   . History of chicken pox   . History of measles as a child   . History of mumps as a child   . Hemorrhoids   . History of jaundice   . History of gallstones   . History of blood clots   . Insomnia   . Night sweats   . Fatigue   . Balance problems     Past Surgical History  Procedure Laterality Date  . Back surgery    . Cholecystectomy    . Abdominal hysterectomy    . Knee surgery    . Tonsillectomy    . Left foot surgery       to repair break - 2002  . Menicus tear      bilat;   . Total knee arthroplasty Right 03/27/2015    Procedure: RIGHT TOTAL KNEE  ARTHROPLASTY;  Surgeon: Susa Day, MD;  Location: [facility];  Service: Orthopedics;  Laterality: Right;    Family History  Problem Relation Age of Onset  . Hypertension Mother   . Heart attack Father 43   Social History:  reports that she has never smoked. She has never used smokeless tobacco. She reports that she does not drink alcohol or use illicit drugs.  Allergies:  Allergies  Allergen Reactions  . Cymbalta [Duloxetine Hcl] Rash  . Naproxen Rash  . Acetaminophen Nausea And Vomiting  . Celebrex [Celecoxib] Itching  . Povidone Iodine Hives  . Codeine Rash  . Doxycycline Rash     (Not in a hospital admission)  No results found for this or any previous visit (from the past 48 hour(s)). No results found.  Review of Systems  Constitutional: Negative.   HENT: Negative.   Eyes: Negative.   Respiratory: Negative.   Cardiovascular: Negative.   Gastrointestinal: Negative.   Genitourinary: Negative.   Musculoskeletal: Positive for joint pain.  Skin: Negative.   Neurological: Negative.     There were  no vitals taken for this visit. Physical Exam  Constitutional: She is oriented to person, place, and time. She appears well-developed.  HENT:  Head: Normocephalic.  Eyes: Pupils are equal, round, and reactive to light.  Neck: Normal range of motion.  Cardiovascular: Normal rate.   Respiratory: Effort normal.  GI: Soft.  Musculoskeletal:  On exam, she rises from the seated position and there is significant crepitus at the patellofemoral joint. She has a negative anterior drawer. No instability. No varus or valgus stress from 0 to 30 degrees.  Neurological: She is alert and oriented to person, place, and time.  Skin: Skin is warm and dry.    Three view radiographs AP, lateral, and sunrise demonstrates no fracture or displacement of the prosthesis.  Assessment/Plan 1. 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 2. Rectus femoris tear with resolved  burning. 3. Elevated BMI.  I had an extensive discussion with she and her husband concerning current pathology, relevant anatomy and treatment option. At this point in time, proceed with monitoring, avoid loading the joint, gentle range of motion. We discussed arthroscopic debridement, possible open exploration of the quadriceps tendon to determine whether there is a tear in the tendon as well. Returning to lumbar spine, she presented with a lumbar MRI. She said her back is better. I see no neural compression. She does have facet arthrosis at 5-1, a small disc protrusion.  We discussed epidural, if she would like to proceed with that. She is more concerned about her knee. We will wait to hear from her. If she calls, we will proceed with arthroscopy and possible open revision of her quad tendon closure.  Plan right knee EUA, possible MUA, arthroscopy, possible open quad tendon repair  Cecilie Kicks., PA-C for Dr. Tonita Cong 12/29/2015, 4:21 PM

## 2016-01-04 NOTE — Patient Instructions (Signed)
TRINA DEATON  01/04/2016   Your procedure is scheduled on: 01/07/2016    Report to [facility] Main  Entrance take [place]  elevators to 3rd floor to  [place] at   0830 AM.  Call this number if you have problems the morning of surgery [phone]   Remember: ONLY 1 PERSON MAY GO WITH YOU TO SHORT STAY TO GET  READY MORNING OF [place].  Do not eat food or drink liquids :After Midnight.     Take these medicines the morning of surgery with A SIP OF WATER: Albuterol Inhaler if needed and bring, Synthroid                                 You may not have any metal on your body including hair pins and              piercings  Do not wear jewelry, make-up, lotions, powders or perfumes, deodorant             Do not wear nail polish.  Do not shave  48 hours prior to surgery.             Do not bring valuables to the hospital. [place].  Contacts, dentures or bridgework may not be worn into surgery.      Patients discharged the day of surgery will not be allowed to drive home.  Name and phone number of your driver:  Special Instructions: coughing and deep breathing exercises, leg exercises              Please read over the following fact sheets you were given: _____________________________________________________________________             [facility] - Preparing for Surgery Before surgery, you can play an important role.  Because skin is not sterile, your skin needs to be as free of germs as possible.  You can reduce the number of germs on your skin by washing with CHG (chlorahexidine gluconate) soap before surgery.  CHG is an antiseptic cleaner which kills germs and bonds with the skin to continue killing germs even after washing. Please DO NOT use if you have an allergy to CHG or antibacterial soaps.  If your skin becomes reddened/irritated stop using the CHG and inform your nurse when you arrive at  Short Stay. Do not shave (including legs and underarms) for at least 48 hours prior to the first CHG shower.  You may shave your face/neck. Please follow these instructions carefully:  1.  Shower with CHG Soap the night before surgery and the  morning of Surgery.  2.  If you choose to wash your hair, wash your hair first as usual with your  normal  shampoo.  3.  After you shampoo, rinse your hair and body thoroughly to remove the  shampoo.                           4.  Use CHG as you would any other liquid soap.  You can apply chg directly  to the skin and wash  Gently with a scrungie or clean washcloth.  5.  Apply the CHG Soap to your body ONLY FROM THE NECK DOWN.   Do not use on face/ open                           Wound or open sores. Avoid contact with eyes, ears mouth and genitals (private parts).                       Wash face,  Genitals (private parts) with your normal soap.             6.  Wash thoroughly, paying special attention to the area where your surgery  will be performed.  7.  Thoroughly rinse your body with warm water from the neck down.  8.  DO NOT shower/wash with your normal soap after using and rinsing off  the CHG Soap.                9.  Pat yourself dry with a clean towel.            10.  Wear clean pajamas.            11.  Place clean sheets on your bed the night of your first shower and do not  sleep with pets. Day of Surgery : Do not apply any lotions/deodorants the morning of surgery.  Please wear clean clothes to the hospital/surgery center.  FAILURE TO FOLLOW THESE INSTRUCTIONS MAY RESULT IN THE CANCELLATION OF YOUR SURGERY PATIENT SIGNATURE_________________________________  NURSE SIGNATURE__________________________________  ________________________________________________________________________

## 2016-01-05 ENCOUNTER — Encounter (HOSPITAL_COMMUNITY)
Admission: RE | Admit: 2016-01-05 | Discharge: 2016-01-05 | Disposition: A | Discharge status: Home / Self Care | Payer: PPO | Source: Ambulatory Visit | Attending: Specialist | Admitting: Specialist

## 2016-01-05 ENCOUNTER — Encounter (HOSPITAL_COMMUNITY): Payer: Self-pay

## 2016-01-05 DIAGNOSIS — M25561 Pain in right knee: Secondary | ICD-10-CM | POA: Diagnosis not present

## 2016-01-05 DIAGNOSIS — X58XXXA Exposure to other specified factors, initial encounter: Secondary | ICD-10-CM | POA: Diagnosis not present

## 2016-01-05 DIAGNOSIS — Z6839 Body mass index (BMI) 39.0-39.9, adult: Secondary | ICD-10-CM | POA: Diagnosis not present

## 2016-01-05 DIAGNOSIS — M24661 Ankylosis, right knee: Secondary | ICD-10-CM | POA: Diagnosis not present

## 2016-01-05 DIAGNOSIS — S76011A Strain of muscle, fascia and tendon of right hip, initial encounter: Secondary | ICD-10-CM | POA: Diagnosis not present

## 2016-01-05 DIAGNOSIS — M222X1 Patellofemoral disorders, right knee: Secondary | ICD-10-CM | POA: Diagnosis not present

## 2016-01-05 DIAGNOSIS — Z96651 Presence of right artificial knee joint: Secondary | ICD-10-CM | POA: Diagnosis not present

## 2016-01-05 DIAGNOSIS — M1990 Unspecified osteoarthritis, unspecified site: Secondary | ICD-10-CM | POA: Diagnosis not present

## 2016-01-05 LAB — CBC
HCT: 41.7 % (ref 36.0–46.0)
Hemoglobin: 14.2 g/dL (ref 12.0–15.0)
MCH: 29.4 pg (ref 26.0–34.0)
MCHC: 34.1 g/dL (ref 30.0–36.0)
MCV: 86.3 fL (ref 78.0–100.0)
Platelets: 310 10*3/uL (ref 150–400)
RBC: 4.83 MIL/uL (ref 3.87–5.11)
RDW: 12.9 % (ref 11.5–15.5)
WBC: 8.3 10*3/uL (ref 4.0–10.5)

## 2016-01-05 NOTE — Progress Notes (Signed)
EKG- 09/17/15- EPIC  ECHO-2/17- EPIC  09/17/15- LOV- card- epic

## 2016-01-07 ENCOUNTER — Ambulatory Visit (HOSPITAL_COMMUNITY): Payer: PPO | Admitting: Certified Registered Nurse Anesthetist

## 2016-01-07 ENCOUNTER — Encounter (HOSPITAL_COMMUNITY): Payer: Self-pay | Admitting: *Deleted

## 2016-01-07 ENCOUNTER — Ambulatory Visit (HOSPITAL_COMMUNITY)
Admission: RE | Admit: 2016-01-07 | Discharge: 2016-01-07 | Disposition: A | Discharge status: Home / Self Care | Payer: PPO | Source: Ambulatory Visit | Attending: Specialist | Admitting: Specialist

## 2016-01-07 ENCOUNTER — Encounter (HOSPITAL_COMMUNITY)
Admission: RE | Disposition: A | Discharge status: Home / Self Care | Payer: Self-pay | Source: Ambulatory Visit | Attending: Specialist

## 2016-01-07 DIAGNOSIS — S76011A Strain of muscle, fascia and tendon of right hip, initial encounter: Secondary | ICD-10-CM | POA: Diagnosis not present

## 2016-01-07 DIAGNOSIS — M24661 Ankylosis, right knee: Secondary | ICD-10-CM | POA: Insufficient documentation

## 2016-01-07 DIAGNOSIS — X58XXXA Exposure to other specified factors, initial encounter: Secondary | ICD-10-CM | POA: Insufficient documentation

## 2016-01-07 DIAGNOSIS — S76111A Strain of right quadriceps muscle, fascia and tendon, initial encounter: Secondary | ICD-10-CM | POA: Diagnosis not present

## 2016-01-07 DIAGNOSIS — Z6839 Body mass index (BMI) 39.0-39.9, adult: Secondary | ICD-10-CM | POA: Insufficient documentation

## 2016-01-07 DIAGNOSIS — M25861 Other specified joint disorders, right knee: Secondary | ICD-10-CM | POA: Diagnosis not present

## 2016-01-07 DIAGNOSIS — Z96651 Presence of right artificial knee joint: Secondary | ICD-10-CM | POA: Diagnosis not present

## 2016-01-07 DIAGNOSIS — M222X1 Patellofemoral disorders, right knee: Secondary | ICD-10-CM | POA: Insufficient documentation

## 2016-01-07 DIAGNOSIS — M1990 Unspecified osteoarthritis, unspecified site: Secondary | ICD-10-CM | POA: Insufficient documentation

## 2016-01-07 HISTORY — PX: KNEE ARTHROSCOPY: SHX127

## 2016-01-07 SURGERY — ARTHROSCOPY, KNEE
Anesthesia: General | Site: Knee | Laterality: Right

## 2016-01-07 MED ORDER — MIDAZOLAM HCL 5 MG/5ML IJ SOLN
INTRAMUSCULAR | Status: DC | PRN
  Administered 2016-01-07: 2 mg via INTRAVENOUS

## 2016-01-07 MED ORDER — PROPOFOL 10 MG/ML IV BOLUS
INTRAVENOUS | Status: DC | PRN
  Administered 2016-01-07: 250 mg via INTRAVENOUS

## 2016-01-07 MED ORDER — OXYCODONE HCL 5 MG PO TABS
5.0000 mg | ORAL_TABLET | Freq: Once | ORAL | Status: AC
  Administered 2016-01-07: 5 mg via ORAL
  Filled 2016-01-07: qty 1

## 2016-01-07 MED ORDER — ONDANSETRON HCL 4 MG/2ML IJ SOLN
INTRAMUSCULAR | Status: DC | PRN
  Administered 2016-01-07: 4 mg via INTRAVENOUS

## 2016-01-07 MED ORDER — PROPOFOL 10 MG/ML IV BOLUS
INTRAVENOUS | Status: AC
  Filled 2016-01-07: qty 20

## 2016-01-07 MED ORDER — LACTATED RINGERS IV SOLN: INTRAVENOUS | Status: DC

## 2016-01-07 MED ORDER — DEXAMETHASONE SODIUM PHOSPHATE 4 MG/ML IJ SOLN
INTRAMUSCULAR | Status: DC | PRN
  Administered 2016-01-07: 10 mg via INTRAVENOUS

## 2016-01-07 MED ORDER — SODIUM CHLORIDE 0.9 % IV SOLN
10.0000 mg | INTRAVENOUS | Status: DC | PRN
  Administered 2016-01-07: 50 ug/min via INTRAVENOUS

## 2016-01-07 MED ORDER — CEPHALEXIN 500 MG PO CAPS: 500.0000 mg | ORAL_CAPSULE | Freq: Four times a day (QID) | ORAL | Status: AC

## 2016-01-07 MED ORDER — BUPIVACAINE-EPINEPHRINE 0.5% -1:200000 IJ SOLN
INTRAMUSCULAR | Status: DC | PRN
  Administered 2016-01-07: 20 mL

## 2016-01-07 MED ORDER — CEFAZOLIN SODIUM-DEXTROSE 2-4 GM/100ML-% IV SOLN
2.0000 g | INTRAVENOUS | Status: AC
  Administered 2016-01-07: 2 g via INTRAVENOUS

## 2016-01-07 MED ORDER — LIDOCAINE HCL (CARDIAC) 20 MG/ML IV SOLN
INTRAVENOUS | Status: DC | PRN
  Administered 2016-01-07: 100 mg via INTRAVENOUS

## 2016-01-07 MED ORDER — LACTATED RINGERS IV SOLN
INTRAVENOUS | Status: DC
  Administered 2016-01-07: 1000 mL via INTRAVENOUS

## 2016-01-07 MED ORDER — FENTANYL CITRATE (PF) 100 MCG/2ML IJ SOLN
INTRAMUSCULAR | Status: DC
Start: 2016-01-07 — End: 2016-01-07
  Filled 2016-01-07: qty 2

## 2016-01-07 MED ORDER — EPINEPHRINE HCL 1 MG/ML IJ SOLN
INTRAMUSCULAR | Status: AC
  Filled 2016-01-07: qty 2

## 2016-01-07 MED ORDER — CEFAZOLIN SODIUM-DEXTROSE 2-4 GM/100ML-% IV SOLN
INTRAVENOUS | Status: AC
Start: 2016-01-07 — End: 2016-01-07
  Filled 2016-01-07: qty 100

## 2016-01-07 MED ORDER — FENTANYL CITRATE (PF) 100 MCG/2ML IJ SOLN
INTRAMUSCULAR | Status: DC | PRN
Start: 2016-01-07 — End: 2016-01-07
  Administered 2016-01-07 (×4): 50 ug via INTRAVENOUS

## 2016-01-07 MED ORDER — FENTANYL CITRATE (PF) 100 MCG/2ML IJ SOLN
INTRAMUSCULAR | Status: AC
  Filled 2016-01-07: qty 2

## 2016-01-07 MED ORDER — MIDAZOLAM HCL 2 MG/2ML IJ SOLN
INTRAMUSCULAR | Status: AC
  Filled 2016-01-07: qty 2

## 2016-01-07 MED ORDER — OXYCODONE HCL 5 MG PO TABS: 5.0000 mg | ORAL_TABLET | ORAL | Status: DC | PRN

## 2016-01-07 MED ORDER — EPINEPHRINE HCL 1 MG/ML IJ SOLN
INTRAMUSCULAR | Status: DC | PRN
  Administered 2016-01-07: 2 mg

## 2016-01-07 MED ORDER — BUPIVACAINE-EPINEPHRINE (PF) 0.5% -1:200000 IJ SOLN
INTRAMUSCULAR | Status: AC
  Filled 2016-01-07: qty 30

## 2016-01-07 MED ORDER — PHENYLEPHRINE HCL 10 MG/ML IJ SOLN
INTRAMUSCULAR | Status: DC | PRN
Start: 2016-01-07 — End: 2016-01-07
  Administered 2016-01-07 (×4): 80 ug via INTRAVENOUS

## 2016-01-07 MED ORDER — LIDOCAINE HCL (CARDIAC) 20 MG/ML IV SOLN
INTRAVENOUS | Status: AC
  Filled 2016-01-07: qty 5

## 2016-01-07 MED ORDER — ONDANSETRON HCL 4 MG/2ML IJ SOLN
INTRAMUSCULAR | Status: AC
  Filled 2016-01-07: qty 2

## 2016-01-07 MED ORDER — STERILE WATER FOR IRRIGATION IR SOLN
Status: DC | PRN
  Administered 2016-01-07: 1000 mL

## 2016-01-07 MED ORDER — DOCUSATE SODIUM 100 MG PO CAPS: 100.0000 mg | ORAL_CAPSULE | Freq: Two times a day (BID) | ORAL | Status: DC | PRN

## 2016-01-07 MED ORDER — FENTANYL CITRATE (PF) 100 MCG/2ML IJ SOLN
25.0000 ug | INTRAMUSCULAR | Status: DC | PRN
  Administered 2016-01-07 (×2): 25 ug via INTRAVENOUS

## 2016-01-07 MED ORDER — LACTATED RINGERS IR SOLN
Status: DC | PRN
  Administered 2016-01-07: 15000 mL

## 2016-01-07 MED ORDER — PHENYLEPHRINE HCL 10 MG/ML IJ SOLN
INTRAMUSCULAR | Status: AC
  Filled 2016-01-07: qty 1

## 2016-01-07 MED ORDER — ASPIRIN EC 325 MG PO TBEC: 325.0000 mg | DELAYED_RELEASE_TABLET | Freq: Every day | ORAL | Status: DC

## 2016-01-07 MED ORDER — CYCLOBENZAPRINE HCL 10 MG PO TABS: 10.0000 mg | ORAL_TABLET | Freq: Three times a day (TID) | ORAL | Status: DC | PRN

## 2016-01-07 SURGICAL SUPPLY — 27 items
BANDAGE ACE 6X5 VEL STRL LF (GAUZE/BANDAGES/DRESSINGS) ×2 IMPLANT
BLADE 4.2CUDA (BLADE) IMPLANT
BLADE CUDA SHAVER 3.5 (BLADE) ×2 IMPLANT
BNDG COHESIVE 6X5 TAN STRL LF (GAUZE/BANDAGES/DRESSINGS) ×2 IMPLANT
BOOTIES KNEE HIGH SLOAN (MISCELLANEOUS) ×2 IMPLANT
CLOTH 2% CHLOROHEXIDINE 3PK (PERSONAL CARE ITEMS) ×2 IMPLANT
DRSG EMULSION OIL 3X3 NADH (GAUZE/BANDAGES/DRESSINGS) ×2 IMPLANT
DRSG PAD ABDOMINAL 8X10 ST (GAUZE/BANDAGES/DRESSINGS) ×2 IMPLANT
DURAPREP 26ML APPLICATOR (WOUND CARE) ×2 IMPLANT
GAUZE SPONGE 4X4 12PLY STRL (GAUZE/BANDAGES/DRESSINGS) ×2 IMPLANT
GLOVE BIOGEL PI IND STRL 7.0 (GLOVE) ×1 IMPLANT
GLOVE BIOGEL PI INDICATOR 7.0 (GLOVE) ×1
GLOVE SURG SS PI 7.0 STRL IVOR (GLOVE) ×2 IMPLANT
GLOVE SURG SS PI 7.5 STRL IVOR (GLOVE) ×4 IMPLANT
GLOVE SURG SS PI 8.0 STRL IVOR (GLOVE) ×2 IMPLANT
GOWN STRL REUS W/TWL XL LVL3 (GOWN DISPOSABLE) ×8 IMPLANT
KIT BASIN OR (CUSTOM PROCEDURE TRAY) ×2 IMPLANT
MANIFOLD NEPTUNE II (INSTRUMENTS) ×2 IMPLANT
PACK ARTHROSCOPY WL (CUSTOM PROCEDURE TRAY) ×2 IMPLANT
PAD ABD 8X10 STRL (GAUZE/BANDAGES/DRESSINGS) ×2 IMPLANT
PADDING CAST COTTON 6X4 STRL (CAST SUPPLIES) ×2 IMPLANT
SUT ETHILON 4 0 PS 2 18 (SUTURE) ×2 IMPLANT
TOWEL OR 17X26 10 PK STRL BLUE (TOWEL DISPOSABLE) ×2 IMPLANT
TUBING ARTHRO INFLOW-ONLY STRL (TUBING) ×2 IMPLANT
WAND HAND CNTRL MULTIVAC 50 (MISCELLANEOUS) ×2 IMPLANT
WAND HAND CNTRL MULTIVAC 90 (MISCELLANEOUS) IMPLANT
WRAP KNEE MAXI GEL POST OP (GAUZE/BANDAGES/DRESSINGS) ×2 IMPLANT

## 2016-01-07 NOTE — H&P (View-Only) (Signed)
Carol [name] is an 60 y.o. female.   Chief Complaint: R knee pain HPI: The patient is a 60 year old female being followed for their right knee pain. They are now 7 month(s) out from total knee arthroplasty. Symptoms reported today include: pain.  Carol [name] reports two weeks ago Carol [name] just started having just some grinding and pain in Carol [name] knees. Carol [name] reported that the burning on the anterior aspect of the knee has resolved.  This has only been for two weeks.  Review of systems is negative for fevers, chest pain, shortness of breath, unexplained recent weight loss, loss of bowel or bladder function, burning with urination, joint swelling, rashes, weakness or numbness, difficulty with balance, easy bruising, excessive thirst or frequent urination.  Past Medical History  Diagnosis Date  . Asthma   . Thyroid disease   . Complication of anesthesia     pt states has difficulty with being put to sleep   . PONV (postoperative nausea and vomiting)   . Family history of adverse reaction to anesthesia     pts daughter has N&V  . Polycythemia   . Shortness of breath dyspnea     humidity; walking   . History of bronchitis   . Pneumonia     hx of   . Hypothyroidism   . Anxiety   . History of kidney stones   . History of frequent urinary tract infections   . Arthritis   . History of blood transfusion     2006  . Tinnitus   . Falls   . History of chicken pox   . History of measles as a child   . History of mumps as a child   . Hemorrhoids   . History of jaundice   . History of gallstones   . History of blood clots   . Insomnia   . Night sweats   . Fatigue   . Balance problems     Past Surgical History  Procedure Laterality Date  . Back surgery    . Cholecystectomy    . Abdominal hysterectomy    . Knee surgery    . Tonsillectomy    . Left foot surgery       to repair break - 2002  . Menicus tear      bilat;   . Total knee arthroplasty Right 03/27/2015    Procedure: RIGHT TOTAL KNEE  ARTHROPLASTY;  Surgeon: Susa Day, MD;  Location: [facility];  Service: Orthopedics;  Laterality: Right;    Family History  Problem Relation Age of Onset  . Hypertension Mother   . Heart attack Father 58   Social History:  reports that Carol [name] has never smoked. Carol [name] has never used smokeless tobacco. Carol [name] reports that Carol [name] does not drink alcohol or use illicit drugs.  Allergies:  Allergies  Allergen Reactions  . Cymbalta [Duloxetine Hcl] Rash  . Naproxen Rash  . Acetaminophen Nausea And Vomiting  . Celebrex [Celecoxib] Itching  . Povidone Iodine Hives  . Codeine Rash  . Doxycycline Rash     (Not in a hospital admission)  No results found for this or any previous visit (from the past 48 hour(s)). No results found.  Review of Systems  Constitutional: Negative.   HENT: Negative.   Eyes: Negative.   Respiratory: Negative.   Cardiovascular: Negative.   Gastrointestinal: Negative.   Genitourinary: Negative.   Musculoskeletal: Positive for joint pain.  Skin: Negative.   Neurological: Negative.     There were  no vitals taken for this visit. Physical Exam  Constitutional: Carol [name] is oriented to person, place, and time. Carol [name] appears well-developed.  HENT:  Head: Normocephalic.  Eyes: Pupils are equal, round, and reactive to light.  Neck: Normal range of motion.  Cardiovascular: Normal rate.   Respiratory: Effort normal.  GI: Soft.  Musculoskeletal:  On exam, Carol [name] rises from the seated position and there is significant crepitus at the patellofemoral joint. Carol [name] has a negative anterior drawer. No instability. No varus or valgus stress from 0 to 30 degrees.  Neurological: Carol [name] is alert and oriented to person, place, and time.  Skin: Skin is warm and dry.    Three view radiographs AP, lateral, and sunrise demonstrates no fracture or displacement of the prosthesis.  Assessment/Plan 1. 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 2. Rectus femoris tear with resolved  burning. 3. Elevated BMI.  I had an extensive discussion with Carol [name] and Carol [name] concerning current pathology, relevant anatomy and treatment option. At this point in time, proceed with monitoring, avoid loading the joint, gentle range of motion. We discussed arthroscopic debridement, possible open exploration of the quadriceps tendon to determine whether there is a tear in the tendon as well. Returning to lumbar spine, Carol [name] presented with a lumbar MRI. Carol [name] said Carol [name] back is better. I see no neural compression. Carol [name] does have facet arthrosis at 5-1, a small disc protrusion.  We discussed epidural, if Carol [name] would like to proceed with that. Carol [name] is more concerned about Carol [name] knee. We will wait to hear from Carol [name]. If Carol [name] calls, we will proceed with arthroscopy and possible open revision of Carol [name] quad tendon closure.  Plan right knee EUA, possible MUA, arthroscopy, possible open quad tendon repair  Cecilie Kicks., PA-C for Dr. Tonita Cong 12/29/2015, 4:21 PM

## 2016-01-07 NOTE — Anesthesia Postprocedure Evaluation (Signed)
Anesthesia Post Note  Patient: Carol [name]  Procedure(s) Performed: Procedure(s) (LRB): ARTHROSCOPY RIGHT KNEE, DEBRIDEMENT OF ARTHROFIBROSIS, AND EXAM UNDER ANESTHESIA (Right)  Patient location during evaluation: PACU Anesthesia Type: General Level of consciousness: awake and alert Pain management: pain level controlled Vital Signs Assessment: post-procedure vital signs reviewed and stable Respiratory status: spontaneous breathing, nonlabored ventilation, respiratory function stable and patient connected to nasal cannula oxygen Cardiovascular status: blood pressure returned to baseline and stable Postop Assessment: no signs of nausea or vomiting Anesthetic complications: no    Last Vitals:  Filed Vitals:   01/07/16 1215 01/07/16 1229  BP: 118/62 122/63  Pulse: 76 79  Temp: 36.3 C 36.3 C  Resp: 12 12    Last Pain:  Filed Vitals:   01/07/16 1246  PainSc: 2                  EWELL,CHARLES L

## 2016-01-07 NOTE — Brief Op Note (Signed)
01/07/2016  11:18 AM  PATIENT:  Carol [name]  60 y.o. female  PRE-OPERATIVE DIAGNOSIS:  right quad tendon tear  POST-OPERATIVE DIAGNOSIS:  right quad tendon tear  PROCEDURE:  Procedure(s): ARTHROSCOPY RIGHT KNEE, ARTHROFIBROSIS, AND EXAM UNDER ANESTHESIA (Right)  SURGEON:  Surgeon(s) and Role:    * Susa Day, MD - Primary  PHYSICIAN ASSISTANT:   ASSISTANTS: [place]   ANESTHESIA:   general  EBL:     BLOOD ADMINISTERED:none  DRAINS: none   LOCAL MEDICATIONS USED:  MARCAINE     SPECIMEN:  No Specimen  DISPOSITION OF SPECIMEN:  N/A  COUNTS:  YES  TOURNIQUET:  * No tourniquets in log *  DICTATION: .Other Dictation: Dictation Number [id number]  PLAN OF CARE: Discharge to home after PACU  PATIENT DISPOSITION:  PACU - hemodynamically stable.   Delay start of Pharmacological VTE agent (>24hrs) due to surgical blood loss or risk of bleeding: no

## 2016-01-07 NOTE — Interval H&P Note (Signed)
History and Physical Interval Note:  01/07/2016 7:30 AM  Carol [name]  has presented today for surgery, with the diagnosis of right quad tendopn tear  The various methods of treatment have been discussed with the patient and family. After consideration of risks, benefits and other options for treatment, the patient has consented to  Procedure(s): ARTHROSCOPY RIGHT KNEE, EVALUATION UNDER ANESTHESIA WITH POSSIBLE MANIPULATION UNDER ANESTHESIA AND POSSIBLE QUAD TENDON REPAIR (Right) as a surgical intervention .  The patient's history has been reviewed, patient examined, no change in status, stable for surgery.  I have reviewed the patient's chart and labs.  Questions were answered to the patient's satisfaction.     BEANE,JEFFREY C

## 2016-01-07 NOTE — Anesthesia Preprocedure Evaluation (Addendum)
Anesthesia Evaluation  Patient identified by MRN, date of birth, ID band Patient awake    Reviewed: Allergy & Precautions, H&P , NPO status , Patient's Chart, lab work & pertinent test results  History of Anesthesia Complications (+) PONV  Airway Mallampati: II  TM Distance: >3 FB Neck ROM: Full    Dental no notable dental hx. (+) Teeth Intact, Dental Advisory Given   Pulmonary asthma ,    Pulmonary exam normal breath sounds clear to auscultation       Cardiovascular Exercise Tolerance: Good negative cardio ROS Normal cardiovascular exam Rhythm:Regular Rate:Normal     Neuro/Psych PSYCHIATRIC DISORDERS Anxiety Depression negative neurological ROS  negative psych ROS   GI/Hepatic negative GI ROS, Neg liver ROS,   Endo/Other  negative endocrine ROSHypothyroidism Morbid obesity  Renal/GU negative Renal ROS  negative genitourinary   Musculoskeletal  (+) Arthritis , Osteoarthritis,    Abdominal (+) + obese,   Peds negative pediatric ROS (+)  Hematology polycythemia   Anesthesia Other Findings   Reproductive/Obstetrics negative OB ROS                            Anesthesia Physical Anesthesia Plan  ASA: III  Anesthesia Plan: General   Post-op Pain Management:    Induction: Intravenous  Airway Management Planned: LMA  Additional Equipment:   Intra-op Plan:   Post-operative Plan:   Informed Consent: I have reviewed the patients History and Physical, chart, labs and discussed the procedure including the risks, benefits and alternatives for the proposed anesthesia with the patient or authorized representative who has indicated his/her understanding and acceptance.   Dental Advisory Given  Plan Discussed with: CRNA  Anesthesia Plan Comments:        Anesthesia Quick Evaluation

## 2016-01-07 NOTE — Transfer of Care (Signed)
Immediate Anesthesia Transfer of Care Note  Patient: Carol [name]  Procedure(s) Performed: Procedure(s): ARTHROSCOPY RIGHT KNEE, DEBRIDEMENT OF ARTHROFIBROSIS, AND EXAM UNDER ANESTHESIA (Right)  Patient Location: PACU  Anesthesia Type:General  Level of Consciousness:  sedated, patient cooperative and responds to stimulation  Airway & Oxygen Therapy:Patient Spontanous Breathing and Patient connected to face mask oxgen  Post-op Assessment:  Report given to PACU RN and Post -op Vital signs reviewed and stable  Post vital signs:  Reviewed and stable  Last Vitals:  Filed Vitals:   01/07/16 0817  BP: 132/89  Pulse: 88  Temp: 36.4 C  Resp: 16    Complications: No apparent anesthesia complications

## 2016-01-07 NOTE — H&P (View-Only) (Signed)
EKG- 09/17/15- EPIC  ECHO-2/17- EPIC  09/17/15- LOV- card- epic

## 2016-01-07 NOTE — Anesthesia Procedure Notes (Signed)
Procedure Name: LMA Insertion Date/Time: 01/07/2016 10:26 AM Performed by: Maxwell Caul Pre-anesthesia Checklist: Patient identified, Emergency Drugs available, Suction available and Patient being monitored Patient Re-evaluated:Patient Re-evaluated prior to inductionOxygen Delivery Method: Circle system utilized Preoxygenation: Pre-oxygenation with 100% oxygen Intubation Type: IV induction LMA: LMA inserted LMA Size: 4.0 Number of attempts: 1 Placement Confirmation: positive ETCO2 and breath sounds checked- equal and bilateral Tube secured with: Tape Dental Injury: Teeth and Oropharynx as per pre-operative assessment

## 2016-01-07 NOTE — Interval H&P Note (Signed)
History and Physical Interval Note:  01/07/2016 7:20 AM  Carol [name]  has presented today for surgery, with the diagnosis of right quad tendopn tear  The various methods of treatment have been discussed with the patient and family. After consideration of risks, benefits and other options for treatment, the patient has consented to  Procedure(s): ARTHROSCOPY RIGHT KNEE, EVALUATION UNDER ANESTHESIA WITH POSSIBLE MANIPULATION UNDER ANESTHESIA AND POSSIBLE QUAD TENDON REPAIR (Right) as a surgical intervention .  The patient's history has been reviewed, patient examined, no change in status, stable for surgery.  I have reviewed the patient's chart and labs.  Questions were answered to the patient's satisfaction.     BEANE,JEFFREY C

## 2016-01-07 NOTE — Op Note (Signed)
NAMERAIN, WILHIDE NO.:  [id number]  MEDICAL RECORD NO.:  [id number]  LOCATION:  [facility]                         FACILITY:  [facility]  PHYSICIAN:  Susa Day, M.D.    DATE OF BIRTH:  [birth year]  DATE OF PROCEDURE:  01/07/2016 DATE OF DISCHARGE:                              OPERATIVE REPORT   PREOPERATIVE DIAGNOSIS: 1. Arthrofibrosis, Cyclops lesion, right knee, status post total knee     replacement. 2. Possible tear of quadriceps tendon.  POSTOPERATIVE DIAGNOSES: 1. Arthrofibrosis, Cyclops lesion, right knee, status post total knee     replacement. 2. Possible tear of quadriceps tendon.  PROCEDURE PERFORMED: 1. Exam under anesthesia. 2. Right knee arthroscopy with extensive synovectomy and debridement     of arthrofibrosis and Cyclops lesion of the patella.  ANESTHESIA:  General.  ASSISTANT:  Lacie Draft, PA was used due to the patient's elevated BMI, required for patient positioning and flexion and holding of the knee in a figure 4 position to gain access for arthroscopic debridement.  HISTORY:  This is a 41 with history of a total knee.  Developed over the past 3 months popping of the patellofemoral joint when arising from the seated position.  She has presumed to have a Cyclops lesion via conservative treatment.  She also had pain in distal quadriceps tendon, was found by ultrasound to have a tear in the rectus femoris muscle, not the tendon.  We talked about being disabling to her.  Exam under anesthesia, possible manipulation and knee arthroscopy, debridement of arthrofibrosis and presumed Cyclops lesion evaluation to determine whether there was a tear in the quadriceps tendon seen intra-articularly that we would proceed with a mini open repair.  Risks and benefits were discussed including bleeding, infection, damage to the neurovascular structures.  No change in symptoms, worsening symptoms, DVT, PE, anesthetic complications,  etc.  TECHNIQUE:  With the patient in supine position, after induction of adequate general anesthesia, 2 g of Kefzol, the right lower extremity was prepped and draped in usual sterile fashion.  Prior to that, we examined under anesthesia.  She had full flexion and full extension. Negative anterior drawer.  Good mid flexion stability.  Excellent patellofemoral tracking.  After prepping and draping, we used a previous arthroscopic portals which we fashioned with a #11 blade.  Ingress cannula was gently and atraumatically placed into the knee joint. Irrigating at 65 mmHg, was used to insufflate the joint.  Hypertrophic synovitis and scar tissue was noted within the tibiofemoral articulation.  I introduced a 4-5 shaver and debrided this extensively, mediolateral compartment and from the notch.  We then held the knee in extension.  I examined her patellofemoral joint in the suprapatellar region.  Palpation and probing of the quadriceps tendon, I did not see an obvious tear that would require open repair, but there was a Cyclops lesion on the superior pole of the patella where I met the quadriceps tendon and plica noted as well as synovitis.  Performed synovectomy and used a shaver to debulk the Cyclops lesion and then further contoured with an ArthroWand and excising the plica as well, taking care not to violate the tendon.  We  felt with flexion and extension that this would radiate into the superior pole of the patella which was snapping over the superior flange of the femoral component, and when we were finished this to no longer do this, we felt this addressed her pathology. Mediolateral gutters were unremarkable.  No active bleeding.  We decreased the pressure inside the joint.  No active bleeding. Reinflated it.  We made a good patellofemoral tracking and full removal of the arthrofibrosis.  We then removed all instrumentation.  Portals were closed with 4-0 nylon simple sutures.   Marcaine 0.25% with epinephrine was infiltrated in the joint.  Wound was dressed sterilely, woke without difficulty, and transported to the recovery room in satisfactory condition.  The patient tolerated the procedure well.  No complications.  Assistant, Lacie Draft, [place].  Minimal blood loss.     Susa Day, M.D.     Geralynn Rile  D:  01/07/2016  T:  01/07/2016  Job:  [id number]

## 2016-01-07 NOTE — Discharge Instructions (Signed)
ARTHROSCOPIC KNEE SURGERY HOME CARE INSTRUCTIONS   PAIN You will be expected to have a moderate amount of pain in the affected knee for approximately two weeks.  However, the first two to four days will be the most severe in terms of the pain you will experience.  Prescriptions have been provided for you to take as needed for the pain.  The pain can be markedly reduced by using the ice/compressive bandage given.  Exchange the ice packs whenever they thaw.  During the night, keep the bandage on because it will still provide some compression for the swelling.  Also, keep the leg elevated on pillows above your heart, and this will help alleviate the pain and swelling.  MEDICATION Prescriptions have been provided to take as needed for pain. To prevent blood clots, take Aspirin 325mg  daily with a meal if not on a blood thinner and if no history of stomach ulcers.  ACTIVITY It is preferred that you stay on bedrest for approximately 24 hours.  However, you may go to the bathroom with help.  After this, you can start to be up and about progressively more.  Remember that the swelling may still increase after three to four days if you are up and doing too much.  You may put as much weight on the affected leg as pain will allow.  Use your crutches for comfort and safety.  However, as soon as you are able, you may discard the crutches and go without them.   DRESSING Keep the current dressing as dry as possible.  Two days after your surgery, you may remove the ice/compressive wrap, and surgical dressing.  You may now take a shower, but do not scrub the sounds directly with soap.  Let water rinse over these and gently wipe with your hand.  Reapply band-aids over the puncture wounds and more gauze if needed.  A slight amount of thin drainage can be normal at this time, and do not let it frighten you.  Reapply the ice/compressive wrap.  You may now repeat this every day each time you shower.  SYMPTOMS TO REPORT TO  YOUR DOCTOR  -Extreme pain.  -Extreme swelling.  -Temperature above 101 degrees that does not come down with acetaminophen     (Tylenol).  -Any changes in the feeling, color or movement of your toes.  -Extreme redness, heat, swelling or drainage at your incision  EXERCISE It is preferred that you begin to exercise on the day of your surgery.  Straight leg raises and short arc quads should be begun the afternoon or evening of surgery and continued until you come back for your follow-up appointment.   Attached is an instruction sheet on how to perform these two simple exercises.  Do these at least three times per day if not more.  You may bend your knee as much as is comfortable.  The puncture wounds may occasionally be slightly uncomfortable with bending of the knee.  Do not let this frighten you.  It is important to keep your knee motion, but do not overdo it.  If you have significant pain, simply do not bend the knee as far.   You will be given more exercises to perform at your first return visit.   Change dressing in three days and replace RETURN APPOINTMENT Please make an appointment to be seen by your doctor in 14 days from your surgery.  Patient Signature:  ________________________________________________________  Nurse's Signature:  ________________________________________________________

## 2016-02-08 ENCOUNTER — Encounter (HOSPITAL_COMMUNITY): Payer: Self-pay | Admitting: Emergency Medicine

## 2016-02-08 ENCOUNTER — Emergency Department (HOSPITAL_COMMUNITY)
Admission: EM | Admit: 2016-02-08 | Discharge: 2016-02-09 | Disposition: A | Discharge status: Other Acute Inpatient Hospital | Payer: PPO | Attending: Emergency Medicine | Admitting: Emergency Medicine

## 2016-02-08 DIAGNOSIS — Z7982 Long term (current) use of aspirin: Secondary | ICD-10-CM | POA: Diagnosis not present

## 2016-02-08 DIAGNOSIS — F32A Depression, unspecified: Secondary | ICD-10-CM

## 2016-02-08 DIAGNOSIS — F329 Major depressive disorder, single episode, unspecified: Secondary | ICD-10-CM | POA: Insufficient documentation

## 2016-02-08 DIAGNOSIS — Z96651 Presence of right artificial knee joint: Secondary | ICD-10-CM | POA: Diagnosis not present

## 2016-02-08 LAB — COMPREHENSIVE METABOLIC PANEL
ALT: 22 U/L (ref 14–54)
AST: 33 U/L (ref 15–41)
Albumin: 4.2 g/dL (ref 3.5–5.0)
Alkaline Phosphatase: 93 U/L (ref 38–126)
Anion gap: 10 (ref 5–15)
BUN: <5 mg/dL — ABNORMAL LOW (ref 6–20)
CO2: 27 mmol/L (ref 22–32)
Calcium: 9.5 mg/dL (ref 8.9–10.3)
Chloride: 103 mmol/L (ref 101–111)
Creatinine, Ser: 0.88 mg/dL (ref 0.44–1.00)
GFR calc Af Amer: >60 mL/min (ref >60)
GFR calc non Af Amer: >60 mL/min (ref >60)
Glucose, Bld: 125 mg/dL — ABNORMAL HIGH (ref 65–99)
Potassium: 3.7 mmol/L (ref 3.5–5.1)
Sodium: 140 mmol/L (ref 135–145)
Total Bilirubin: 0.7 mg/dL (ref 0.3–1.2)
Total Protein: 7.7 g/dL (ref 6.5–8.1)

## 2016-02-08 LAB — LIPASE, BLOOD: Lipase: 19 U/L (ref 11–51)

## 2016-02-08 LAB — CBC
HCT: 47 % — ABNORMAL HIGH (ref 36.0–46.0)
Hemoglobin: 15.5 g/dL — ABNORMAL HIGH (ref 12.0–15.0)
MCH: 29.5 pg (ref 26.0–34.0)
MCHC: 33 g/dL (ref 30.0–36.0)
MCV: 89.5 fL (ref 78.0–100.0)
Platelets: 382 10*3/uL (ref 150–400)
RBC: 5.25 MIL/uL — ABNORMAL HIGH (ref 3.87–5.11)
RDW: 13.2 % (ref 11.5–15.5)
WBC: 8.8 10*3/uL (ref 4.0–10.5)

## 2016-02-08 MED ORDER — ZOLPIDEM TARTRATE 5 MG PO TABS: 5.0000 mg | ORAL_TABLET | Freq: Every evening | ORAL | Status: DC | PRN

## 2016-02-08 MED ORDER — ONDANSETRON 4 MG PO TBDP
4.0000 mg | ORAL_TABLET | Freq: Once | ORAL | Status: AC | PRN
  Administered 2016-02-08: 4 mg via ORAL

## 2016-02-08 MED ORDER — ONDANSETRON HCL 4 MG PO TABS
4.0000 mg | ORAL_TABLET | Freq: Three times a day (TID) | ORAL | Status: DC | PRN
Start: 2016-02-08 — End: 2016-02-09

## 2016-02-08 MED ORDER — LORAZEPAM 1 MG PO TABS: 1.0000 mg | ORAL_TABLET | Freq: Three times a day (TID) | ORAL | Status: DC | PRN

## 2016-02-08 MED ORDER — ONDANSETRON 4 MG PO TBDP
4.0000 mg | ORAL_TABLET | Freq: Once | ORAL | Status: AC | PRN
  Administered 2016-02-08: 4 mg via ORAL
  Filled 2016-02-08: qty 1

## 2016-02-08 MED ORDER — SODIUM CHLORIDE 0.9 % IV BOLUS (SEPSIS): 1000.0000 mL | Freq: Once | INTRAVENOUS | Status: DC

## 2016-02-08 MED ORDER — ONDANSETRON 4 MG PO TBDP
ORAL_TABLET | ORAL | Status: AC
  Filled 2016-02-08: qty 1
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
## 2016-02-08 NOTE — ED Provider Notes (Signed)
CSN: [id number]     Arrival date & time 02/08/16  1456 History   First MD Initiated Contact with Patient 02/08/16 2143     Chief Complaint  Patient presents with  . Depression  . Anorexia     (Consider location/radiation/quality/duration/timing/severity/associated sxs/prior Treatment) HPI Comments: This a 60 year old female with a history of depression and anxiety/panic attacks.  States she's been having ongoing argument with her son and daughter-in-law who keeps threatening to keep her grandson from her since the evening of July.  She states that she has been not sleeping well, not eating and is extremely stressed.  She thought about killing herself several days ago, but then thought better of it as she needs to live for her daughter  Patient is a 60 y.o. female presenting with depression. The history is provided by the patient.  Depression This is a recurrent problem. The current episode started 1 to 4 weeks ago. The problem occurs constantly. The problem has been gradually worsening. Associated symptoms include abdominal pain and anorexia. Pertinent negatives include no chest pain, chills, fever or headaches. Nothing aggravates the symptoms. She has tried nothing for the symptoms. The treatment provided no relief.    Past Medical History  Diagnosis Date  . Thyroid disease   . Complication of anesthesia     pt states has difficulty with being put to sleep   . PONV (postoperative nausea and vomiting)   . Family history of adverse reaction to anesthesia     pts daughter has N&V  . Polycythemia   . History of bronchitis   . Pneumonia     hx of   . Hypothyroidism   . Anxiety   . History of kidney stones   . History of frequent urinary tract infections   . Arthritis   . History of blood transfusion     2006  . Tinnitus   . Falls   . History of chicken pox   . History of measles as a child   . History of mumps as a child   . Hemorrhoids   . History of jaundice   . History of  gallstones   . History of blood clots   . Insomnia   . Night sweats   . Fatigue   . Balance problems    Past Surgical History  Procedure Laterality Date  . Back surgery    . Cholecystectomy    . Abdominal hysterectomy    . Knee surgery    . Tonsillectomy    . Left foot surgery       to repair break - 2002  . Menicus tear      bilat;   . Total knee arthroplasty Right 03/27/2015    Procedure: RIGHT TOTAL KNEE ARTHROPLASTY;  Surgeon: Susa Day, MD;  Location: [facility];  Service: Orthopedics;  Laterality: Right;  . Knee arthroscopy Right 01/07/2016    Procedure: ARTHROSCOPY RIGHT KNEE, DEBRIDEMENT OF ARTHROFIBROSIS, AND EXAM UNDER ANESTHESIA;  Surgeon: Susa Day, MD;  Location: [facility];  Service: Orthopedics;  Laterality: Right;   Family History  Problem Relation Age of Onset  . Hypertension Mother   . Heart attack Father 7   Social History  Substance Use Topics  . Smoking status: Never Smoker   . Smokeless tobacco: Never Used  . Alcohol Use: No   OB History    No data available     Review of Systems  Constitutional: Positive for appetite change. Negative for fever and chills.  Respiratory: Negative for shortness of breath.   Cardiovascular: Negative for chest pain.  Gastrointestinal: Positive for abdominal pain and anorexia.  Genitourinary: Positive for dysuria. Negative for frequency.  Neurological: Negative for dizziness and headaches.  Psychiatric/Behavioral: Positive for depression.  All other systems reviewed and are negative.     Allergies  Cymbalta; Naproxen; Acetaminophen; Celebrex; Povidone iodine; Codeine; and Doxycycline  Home Medications   Prior to Admission medications   Medication Sig Start Date End Date Taking? Authorizing Provider  ALPRAZolam Duanne Moron) 0.5 MG tablet Take 0.5 mg by mouth at bedtime as needed for anxiety.    Historical Provider, MD  aspirin EC 325 MG tablet Take 1 tablet (325 mg total) by mouth daily. 01/07/16   Susa Day, MD   cyclobenzaprine (FLEXERIL) 10 MG tablet Take 10 mg by mouth daily as needed for muscle spasms.  12/08/15   Historical Provider, MD  cyclobenzaprine (FLEXERIL) 10 MG tablet Take 1 tablet (10 mg total) by mouth every 8 (eight) hours as needed for muscle spasms. 01/07/16   Susa Day, MD  docusate sodium (COLACE) 100 MG capsule Take 1 capsule (100 mg total) by mouth 2 (two) times daily as needed for mild constipation. 01/07/16   Susa Day, MD  gabapentin (NEURONTIN) 300 MG capsule Take 300 mg by mouth 3 (three) times daily.    Historical Provider, MD  levothyroxine (SYNTHROID, LEVOTHROID) 112 MCG tablet Take 112 mcg by mouth daily before breakfast. 12/24/15   Historical Provider, MD  oxyCODONE (OXY IR/ROXICODONE) 5 MG immediate release tablet Take 1-2 tablets (5-10 mg total) by mouth every 4 (four) hours as needed for severe pain. 01/07/16   Susa Day, MD  promethazine (PHENERGAN) 25 MG tablet Take 25 mg by mouth as needed for nausea or vomiting.  12/08/15   Historical Provider, MD  vitamin B-12 (CYANOCOBALAMIN) 500 MCG tablet Take 1,000 mcg by mouth daily.    Historical Provider, MD   BP 129/88 mmHg  Pulse 98  Temp(Src) 98.1 F (36.7 C) (Oral)  Resp 14  SpO2 100% Physical Exam  Constitutional: She appears well-developed and well-nourished.  HENT:  Head: Normocephalic.  Eyes: Pupils are equal, round, and reactive to light.  Neck: Normal range of motion.  Cardiovascular: Normal rate and regular rhythm.   Pulmonary/Chest: Effort normal and breath sounds normal.  Abdominal: Soft. She exhibits no distension. There is no tenderness.  Musculoskeletal: Normal range of motion.  Neurological: She is alert.  Skin: Skin is warm.  Nursing note and vitals reviewed.   ED Course  Procedures (including critical care time) Labs Review Labs Reviewed  COMPREHENSIVE METABOLIC PANEL - Abnormal; Notable for the following:    Glucose, Bld 125 (*)    BUN <5 (*)    All other components within normal  limits  CBC - Abnormal; Notable for the following:    RBC 5.25 (*)    Hemoglobin 15.5 (*)    HCT 47.0 (*)    All other components within normal limits  LIPASE, BLOOD  URINALYSIS, ROUTINE W REFLEX MICROSCOPIC (NOT AT [facility])    Imaging Review No results found. I have personally reviewed and evaluated these images and lab results as part of my medical decision-making.   EKG Interpretation None     Vision does not appear ill.  I will have TTS evaluation Patient has been evaluated by TTS she does meet criteria for inpatient therapy.  She will be moved to Pod C awaiting bed placement MDM   Final diagnoses:  Depression  Junius Creamer, NP 02/08/16 2346  Junius Creamer, NP 02/09/16 [id number]  Veryl Speak, MD 02/09/16 1515

## 2016-02-08 NOTE — ED Notes (Signed)
TTS in progress 

## 2016-02-08 NOTE — BH Assessment (Addendum)
Tele Assessment Note   Carol [name] is an 60 y.o. married female who presents unaccompanied to [facility] ED due to symptoms of depression and anxiety, including not eating and drinking. Pt reports a history of treatment for depression and anxiety and says she has been very upset for the past two weeks due to family conflicts. She states that she has been in bed for the past week and no longer wants to live. She reports she feels "overwhelmed" by all her problems and "I feel like I'm a burden to my family." Pt reports symptoms including crying spells, social withdrawal, loss of interest in usual pleasures, fatigue, irritability, decreased concentration, decreased sleep, decreased appetite and feelings of guilt. worthlessness and hopelessness. Pt says she feels she is having "constant panic attacks." She says she is easily frustrated and will "scream at the top of my lungs" when having conflicts with family. She says she has thought of several ways to kill herself but she doesn't want to leave her daughter, who needs her. She reports a history of overdosing in 2007 and says she has verbally threatened suicide before. She states she has several guns in her home. Pt reports her mother attempted suicide by overdose. Pt denies homicidal ideation or history of violence. She denies any history of psychotic symptoms. Pt denies any history of alcohol or substance abuse.  Pt identifies conflicts with her son as her primary stressor. Pt says her son and his wife have told Pt she will never have contact with her newborn grandson. Pt and son also had conflict regarding an investment in a family travel trailer. Pt says she also is frustrated and depressed because she has several medical problems, including back and knee pain, and cannot do the physical activities she once did. She is also experiencing financial stress. Pt lives with her husband and adult daughter, who has chronic medical problems. She says in the past her  husband was communicating via text with a woman and even though they went to couples counseling she still doesn't fully trust him.   Pt says she has been on several antidepressants and benzodiazepines over the past ten years. She is not currently seeing a psychiatrist or therapist. She says her primary care physician, Dr. Rogers Seeds, prescribe her Xanax. Pt says Xanax is no longer affecting her anxiety. Pt reports one previous psychiatric hospitalization in 2007 at [facility] following her overdose.  Pt is dressed in hospital gown, alert, oriented x4 with normal speech and normal motor behavior. Eye contact is good and Pt is tearful at times. Pt's mood is depressed and anxious; affect is congruent with mood. Thought process is coherent and relevant. There is no indication Pt is currently responding to internal stimuli or experiencing delusional thought content. Pt states she feels she needs psychiatric treatment and is willing to sign voluntarily into a psychiatric facility.   Diagnosis: Major Depressive Disorder, Recurrent, Severe Without Psychotic Features; Generalized Anxiety Disorder  Past Medical History:  Past Medical History  Diagnosis Date  . Thyroid disease   . Complication of anesthesia     pt states has difficulty with being put to sleep   . PONV (postoperative nausea and vomiting)   . Family history of adverse reaction to anesthesia     pts daughter has N&V  . Polycythemia   . History of bronchitis   . Pneumonia     hx of   . Hypothyroidism   . Anxiety   .  History of kidney stones   . History of frequent urinary tract infections   . Arthritis   . History of blood transfusion     2006  . Tinnitus   . Falls   . History of chicken pox   . History of measles as a child   . History of mumps as a child   . Hemorrhoids   . History of jaundice   . History of gallstones   . History of blood clots   . Insomnia   . Night sweats   . Fatigue   .  Balance problems     Past Surgical History  Procedure Laterality Date  . Back surgery    . Cholecystectomy    . Abdominal hysterectomy    . Knee surgery    . Tonsillectomy    . Left foot surgery       to repair break - 2002  . Menicus tear      bilat;   . Total knee arthroplasty Right 03/27/2015    Procedure: RIGHT TOTAL KNEE ARTHROPLASTY;  Surgeon: Susa Day, MD;  Location: [facility];  Service: Orthopedics;  Laterality: Right;  . Knee arthroscopy Right 01/07/2016    Procedure: ARTHROSCOPY RIGHT KNEE, DEBRIDEMENT OF ARTHROFIBROSIS, AND EXAM UNDER ANESTHESIA;  Surgeon: Susa Day, MD;  Location: [facility];  Service: Orthopedics;  Laterality: Right;    Family History:  Family History  Problem Relation Age of Onset  . Hypertension Mother   . Heart attack Father 109    Social History:  reports that she has never smoked. She has never used smokeless tobacco. She reports that she does not drink alcohol or use illicit drugs.  Additional Social History:  Alcohol / Drug Use Pain Medications: Denies abuse Prescriptions: Denies abuse Over the Counter: Denies abuse History of alcohol / drug use?: No history of alcohol / drug abuse Longest period of sobriety (when/how long): NA  CIWA: CIWA-Ar BP: 129/88 mmHg Pulse Rate: 98 COWS:    PATIENT STRENGTHS: (choose at least two) Ability for insight Average or above average intelligence Capable of independent living Communication skills Financial means General fund of knowledge Motivation for treatment/growth Religious Affiliation Supportive family/friends Work skills  Allergies:  Allergies  Allergen Reactions  . Cymbalta [Duloxetine Hcl] Rash  . Naproxen Rash  . Acetaminophen Nausea And Vomiting  . Celebrex [Celecoxib] Itching  . Povidone Iodine Hives  . Codeine Rash  . Doxycycline Rash    Home Medications:  (Not in a hospital admission)  OB/GYN Status:  No LMP recorded. Patient has had a hysterectomy.  General  Assessment Data Location of Assessment: [facility] ED TTS Assessment: In system Is this a Tele or Face-to-Face Assessment?: Tele Assessment Is this an Initial Assessment or a Re-assessment for this encounter?: Initial Assessment Marital status: Married Wimer name: NA Is patient pregnant?: No Pregnancy Status: No Living Arrangements: Spouse/significant other, Children (Husband and adult daughter) Can pt return to current living arrangement?: Yes Admission Status: Voluntary Is patient capable of signing voluntary admission?: Yes Referral Source: Self/Family/Friend Insurance type: Unknown     Crisis Care Plan Living Arrangements: Spouse/significant other, Children (Husband and adult daughter) Legal Guardian: Other: (Self) Name of Psychiatrist: None Name of Therapist: None  Education Status Is patient currently in school?: No Current Grade: NA Highest grade of school patient has completed: NA Name of school: NA Contact person: NA  Risk to self with the past 6 months Suicidal Ideation: Yes-Currently Present Has patient been a risk to self  within the past 6 months prior to admission? : Yes Suicidal Intent: No Has patient had any suicidal intent within the past 6 months prior to admission? : No Is patient at risk for suicide?: Yes Suicidal Plan?: Yes-Currently Present Has patient had any suicidal plan within the past 6 months prior to admission? : Yes Specify Current Suicidal Plan: Pt reports she has thought of several ways to kill herself Access to Means: Yes Specify Access to Suicidal Means: Pt has firearms in home What has been your use of drugs/alcohol within the last 12 months?: Pt denies Previous Attempts/Gestures: Yes How many times?: 1 (Pt overdosed on Xanax in 2007) Other Self Harm Risks: Pt is not eating or drinking sufficiently Triggers for Past Attempts: Family contact Intentional Self Injurious Behavior: None Family Suicide History: Yes (Mother attempted suicide by  overdose) Recent stressful life event(s): Conflict (Comment), Financial Problems (Conflicts with family members) Persecutory voices/beliefs?: No Depression: Yes Depression Symptoms: Despondent, Insomnia, Tearfulness, Isolating, Fatigue, Guilt, Loss of interest in usual pleasures, Feeling worthless/self pity, Feeling angry/irritable Substance abuse history and/or treatment for substance abuse?: No Suicide prevention information given to non-admitted patients: Not applicable  Risk to Others within the past 6 months Homicidal Ideation: No Does patient have any lifetime risk of violence toward others beyond the six months prior to admission? : No Thoughts of Harm to Others: No Current Homicidal Intent: No Current Homicidal Plan: No Access to Homicidal Means: No Identified Victim: None History of harm to others?: No Assessment of Violence: None Noted Violent Behavior Description: Pt denies history of violence Does patient have access to weapons?: Yes (Comment) (Pt has firearms in home) Criminal Charges Pending?: No Does patient have a court date: No Is patient on probation?: No  Psychosis Hallucinations: None noted Delusions: None noted  Mental Status Report Appearance/Hygiene: In hospital gown Eye Contact: Good Motor Activity: Unremarkable Speech: Logical/coherent Level of Consciousness: Alert, Crying Mood: Depressed, Anxious Affect: Depressed, Anxious Anxiety Level: Panic Attacks Panic attack frequency: Daily Most recent panic attack: Today Thought Processes: Coherent, Relevant Judgement: Unimpaired Orientation: Person, Place, Time, Situation, Appropriate for developmental age Obsessive Compulsive Thoughts/Behaviors: None  Cognitive Functioning Concentration: Decreased Memory: Recent Intact, Remote Intact IQ: Average Insight: Fair Impulse Control: Good Appetite: Poor Weight Loss: 0 (Unknown) Weight Gain: 0 Sleep: Decreased Total Hours of Sleep: 4 Vegetative  Symptoms: Staying in bed, Decreased grooming  ADLScreening [facility] Assessment Services) Patient's cognitive ability adequate to safely complete daily activities?: Yes Patient able to express need for assistance with ADLs?: Yes Independently performs ADLs?: Yes (appropriate for developmental age)  Prior Inpatient Therapy Prior Inpatient Therapy: Yes Prior Therapy Dates: 2007 Prior Therapy Facilty/Provider(s): [facility] Reason for Treatment: Xanax overdose  Prior Outpatient Therapy Prior Outpatient Therapy: Yes Prior Therapy Dates: Current Prior Therapy Facilty/Provider(s): Primary Care: Dr. Rogers Seeds Reason for Treatment: Anxiety, depression Does patient have an ACCT team?: No Does patient have Intensive In-House Services?  : No Does patient have [facility] services? : No Does patient have P4CC services?: No  ADL Screening (condition at time of admission) Patient's cognitive ability adequate to safely complete daily activities?: Yes Is the patient deaf or have difficulty hearing?: No Does the patient have difficulty seeing, even when wearing glasses/contacts?: No Does the patient have difficulty concentrating, remembering, or making decisions?: No Patient able to express need for assistance with ADLs?: Yes Does the patient have difficulty dressing or bathing?: No Independently performs ADLs?: Yes (appropriate for developmental age) Does the patient have difficulty walking or  climbing stairs?: No Weakness of Legs: None Weakness of Arms/Hands: None       Abuse/Neglect Assessment (Assessment to be complete while patient is alone) Physical Abuse: Denies Verbal Abuse: Denies Sexual Abuse: Denies Exploitation of patient/patient's resources: Denies Self-Neglect: Denies     Regulatory affairs officer (For Healthcare) Does patient have an advance directive?: No Would patient like information on creating an advanced directive?: No - patient declined information    Additional  Information 1:1 In Past 12 Months?: No CIRT Risk: No Elopement Risk: No Does patient have medical clearance?: Yes     Disposition: Inocencio Homes, AC at [facility], confirmed bed availability. Gave clinical report to Patriciaann Clan, PA who said Pt meets criteria for inpatient psychiatric treatment and accepted Pt to the service of Dr. Wanda Plump. [place], room 406-1. Notified Junius Creamer, NP and Butler Denmark, RN of acceptance.  Disposition Initial Assessment Completed for this Encounter: Yes Disposition of Patient: Inpatient treatment program Type of inpatient treatment program: Adult   Evelena Peat, [facility], [facility], [facility] Triage Specialist [phone]   Evelena Peat 02/08/2016 11:31 PM

## 2016-02-09 ENCOUNTER — Inpatient Hospital Stay (HOSPITAL_COMMUNITY)
Admission: AD | Admit: 2016-02-09 | Discharge: 2016-02-12 | DRG: 885 | Disposition: A | Discharge status: Home / Self Care | Payer: PPO | Source: Intra-hospital | Attending: Psychiatry | Admitting: Psychiatry

## 2016-02-09 ENCOUNTER — Encounter (HOSPITAL_COMMUNITY): Payer: Self-pay

## 2016-02-09 DIAGNOSIS — F329 Major depressive disorder, single episode, unspecified: Secondary | ICD-10-CM | POA: Diagnosis not present

## 2016-02-09 DIAGNOSIS — R45851 Suicidal ideations: Secondary | ICD-10-CM | POA: Diagnosis not present

## 2016-02-09 DIAGNOSIS — G4700 Insomnia, unspecified: Secondary | ICD-10-CM | POA: Diagnosis present

## 2016-02-09 DIAGNOSIS — F332 Major depressive disorder, recurrent severe without psychotic features: Secondary | ICD-10-CM | POA: Diagnosis not present

## 2016-02-09 LAB — URINE MICROSCOPIC-ADD ON

## 2016-02-09 LAB — URINALYSIS, ROUTINE W REFLEX MICROSCOPIC
Glucose, UA: NEGATIVE mg/dL
Hgb urine dipstick: NEGATIVE
Ketones, ur: 15 mg/dL — AB
Nitrite: NEGATIVE
Protein, ur: NEGATIVE mg/dL
Specific Gravity, Urine: 1.022 (ref 1.005–1.030)
pH: 5 (ref 5.0–8.0)

## 2016-02-09 MED ORDER — TRAZODONE HCL 50 MG PO TABS
50.0000 mg | ORAL_TABLET | Freq: Every evening | ORAL | Status: DC | PRN
  Filled 2016-02-09 (×2): qty 1

## 2016-02-09 MED ORDER — VITAMIN B-12 1000 MCG PO TABS: 1000.0000 ug | ORAL_TABLET | Freq: Every day | ORAL | Status: DC

## 2016-02-09 MED ORDER — GABAPENTIN 300 MG PO CAPS: 300.0000 mg | ORAL_CAPSULE | Freq: Three times a day (TID) | ORAL | Status: DC

## 2016-02-09 MED ORDER — HYDROXYZINE HCL 25 MG PO TABS
25.0000 mg | ORAL_TABLET | Freq: Four times a day (QID) | ORAL | Status: AC | PRN
  Administered 2016-02-09 – 2016-02-11 (×3): 25 mg via ORAL
  Filled 2016-02-09 (×3): qty 1

## 2016-02-09 MED ORDER — TRAZODONE HCL 50 MG PO TABS
50.0000 mg | ORAL_TABLET | Freq: Every evening | ORAL | Status: DC | PRN
  Administered 2016-02-09 – 2016-02-10 (×2): 50 mg via ORAL
  Filled 2016-02-09 (×2): qty 1

## 2016-02-09 MED ORDER — LORAZEPAM 1 MG PO TABS
1.0000 mg | ORAL_TABLET | Freq: Four times a day (QID) | ORAL | Status: AC | PRN
  Administered 2016-02-09 – 2016-02-10 (×3): 1 mg via ORAL
  Filled 2016-02-09 (×3): qty 1

## 2016-02-09 MED ORDER — VENLAFAXINE HCL ER 37.5 MG PO CP24
37.5000 mg | ORAL_CAPSULE | Freq: Every day | ORAL | Status: DC
  Administered 2016-02-09 – 2016-02-11 (×3): 37.5 mg via ORAL
  Filled 2016-02-09 (×6): qty 1

## 2016-02-09 MED ORDER — ALUM & MAG HYDROXIDE-SIMETH 200-200-20 MG/5ML PO SUSP: 30.0000 mL | ORAL | Status: DC | PRN

## 2016-02-09 MED ORDER — ONDANSETRON 4 MG PO TBDP: 4.0000 mg | ORAL_TABLET | Freq: Four times a day (QID) | ORAL | Status: AC | PRN

## 2016-02-09 MED ORDER — MAGNESIUM HYDROXIDE 400 MG/5ML PO SUSP: 30.0000 mL | Freq: Every day | ORAL | Status: DC | PRN

## 2016-02-09 MED ORDER — LEVOTHYROXINE SODIUM 112 MCG PO TABS: 112.0000 ug | ORAL_TABLET | Freq: Every day | ORAL | Status: DC

## 2016-02-09 MED ORDER — CYCLOBENZAPRINE HCL 10 MG PO TABS
10.0000 mg | ORAL_TABLET | Freq: Every day | ORAL | Status: DC | PRN
  Administered 2016-02-09 – 2016-02-10 (×2): 10 mg via ORAL
  Filled 2016-02-09 (×3): qty 1

## 2016-02-09 MED ORDER — PROMETHAZINE HCL 25 MG PO TABS: 25.0000 mg | ORAL_TABLET | ORAL | Status: DC | PRN

## 2016-02-09 MED ORDER — LEVOTHYROXINE SODIUM 112 MCG PO TABS
112.0000 ug | ORAL_TABLET | Freq: Every day | ORAL | Status: DC
  Administered 2016-02-09 – 2016-02-12 (×4): 112 ug via ORAL
  Filled 2016-02-09 (×7): qty 1

## 2016-02-09 MED ORDER — VITAMIN B-12 1000 MCG PO TABS
1000.0000 ug | ORAL_TABLET | Freq: Every day | ORAL | Status: DC
  Administered 2016-02-09 – 2016-02-12 (×4): 1000 ug via ORAL
  Filled 2016-02-09 (×7): qty 1

## 2016-02-09 MED ORDER — DOCUSATE SODIUM 100 MG PO CAPS: 100.0000 mg | ORAL_CAPSULE | Freq: Two times a day (BID) | ORAL | Status: DC | PRN

## 2016-02-09 MED ORDER — HYDROXYZINE HCL 25 MG PO TABS
25.0000 mg | ORAL_TABLET | Freq: Four times a day (QID) | ORAL | Status: DC | PRN
  Administered 2016-02-09: 25 mg via ORAL
  Filled 2016-02-09: qty 1

## 2016-02-09 MED ORDER — GABAPENTIN 300 MG PO CAPS
300.0000 mg | ORAL_CAPSULE | Freq: Three times a day (TID) | ORAL | Status: DC
  Administered 2016-02-09 – 2016-02-12 (×11): 300 mg via ORAL
  Filled 2016-02-09 (×3): qty 1
  Filled 2016-02-09: qty 21
  Filled 2016-02-09 (×10): qty 1
  Filled 2016-02-09 (×2): qty 21
  Filled 2016-02-09: qty 1

## 2016-02-09 MED ORDER — ASPIRIN EC 325 MG PO TBEC: 325.0000 mg | DELAYED_RELEASE_TABLET | Freq: Every day | ORAL | Status: DC

## 2016-02-09 MED ORDER — LOPERAMIDE HCL 2 MG PO CAPS: 2.0000 mg | ORAL_CAPSULE | ORAL | Status: AC | PRN

## 2016-02-09 NOTE — BHH Group Notes (Signed)
Adult Psychoeducational Group Note  Date:  02/09/2016 Time:  9:08 PM  Group Topic/Focus:  Wrap-Up Group:   The focus of this group is to help patients review their daily goal of treatment and discuss progress on daily workbooks.  Participation Level:  Minimal  Participation Quality:  Appropriate  Affect:  Appropriate  Cognitive:  Appropriate  Insight: Good  Engagement in Group:  Limited  Modes of Intervention:  Discussion  Additional Comments:  Pt rated her day a 10 because she was around people.  Pt goal was to see the doctor and "get treatment plan together".  Pt stated she may discharge Saturday morning.  Victorino Sparrow A 02/09/2016, 9:08 PM

## 2016-02-09 NOTE — Progress Notes (Signed)
Patient ID: Carol [name], female   DOB: [birth year], 60 y.o.   MRN: [id number]  PER STATE REGULATIONS 482.30  THIS CHART WAS REVIEWED FOR MEDICAL NECESSITY WITH RESPECT TO THE PATIENT'S ADMISSION/DURATION OF STAY.  NEXT REVIEW DATE:02/13/16  Roma Schanz, RN, BSN CASE MANAGER

## 2016-02-09 NOTE — BHH Group Notes (Signed)
[facility] LCSW Group Therapy Note  Date/Time: 02/09/2016   1:30PM  Type of Therapy and Topic:  Group Therapy:  Who Am I?  Self Esteem, Self-Actualization and Understanding Self.  Participation Level:  Active  Description of Group:    In this group patients will be asked to explore values, beliefs, truths, and morals as they relate to personal self.  Patients will be guided to discuss their thoughts, feelings, and behaviors related to what they identify as important to their true self. Patients will process together how values, beliefs and truths are connected to specific choices patients make every day. Each patient will be challenged to identify changes that they are motivated to make in order to improve self-esteem and self-actualization. This group will be process-oriented, with patients participating in exploration of their own experiences as well as giving and receiving support and challenge from other group members.  Therapeutic Goals: 1. Patient will identify false beliefs that currently interfere with their self-esteem.  2. Patient will identify feelings, thought process, and behaviors related to self and will become aware of the uniqueness of themselves and of others.  3. Patient will be able to identify and verbalize values, morals, and beliefs as they relate to self. 4. Patient will begin to learn how to build self-esteem/self-awareness by expressing what is important and unique to them personally.  Summary of Patient Progress  Patient identified her weight as negatively influencing her self-esteem and reports that she feels like a burden to her family. CSW and other group members provided patient with emotional support and encouragement.     Therapeutic Modalities:   Cognitive Behavioral Therapy Solution Focused Therapy Motivational Interviewing Brief Therapy   Tilden Fossa, LCSW Clinical Social Worker [facility] [phone]

## 2016-02-09 NOTE — Tx Team (Signed)
Interdisciplinary Treatment Plan Update (Adult) Date: 02/09/2016    Time Reviewed: 9:30 AM  Progress in Treatment: Attending groups: Continuing to assess, patient new to milieu  Participating in groups: Continuing to assess, patient new to milieu Taking medication as prescribed: Yes Tolerating medication: Yes Family/Significant other contact made: No, CSW assessing for appropriate contacts Patient understands diagnosis: Yes Discussing patient identified problems/goals with staff: Yes Medical problems stabilized or resolved: Yes Denies suicidal/homicidal ideation: Yes Issues/concerns per patient self-inventory: Yes Other:  New problem(s) identified: N/A  Discharge Plan or Barriers: CSW continuing to assess, patient new to milieu.  Reason for Continuation of Hospitalization:  Depression Anxiety Medication Stabilization   Comments: N/A  Estimated length of stay: 3-5 days    Patient is a 60 year old female with a history of depression and anxiety who presented to the hospital with SI, increased depression, and panic attacks. Pt reports primary trigger(s) for admission was family and marital stressors as well as chronic pain issues. Patient will benefit from crisis stabilization, medication evaluation, group therapy and psycho education in addition to case management for discharge planning. At discharge, it is recommended that Pt remain compliant with established discharge plan and continued treatment.   Review of initial/current patient goals per problem list:  1. Goal(s): Patient will participate in aftercare plan   Met: Yes   Target date: 3-5 days post admission date   As evidenced by: Patient will participate within aftercare plan AEB aftercare provider and housing plan at discharge being identified.   7/18: Goal met. Patient plans to return home to follow up with outpatient services.    2. Goal (s): Patient will exhibit decreased depressive symptoms and suicidal  ideations.   Met: No   Target date: 3-5 days post admission date   As evidenced by: Patient will utilize self rating of depression at 3 or below and demonstrate decreased signs of depression or be deemed stable for discharge by MD.  7/18: Goal not met: Pt presents with flat affect and depressed mood.  Pt admitted with depression rating of 10.  Pt to show decreased sign of depression and a rating of 3 or less before d/c.       3. Goal(s): Patient will demonstrate decreased signs and symptoms of anxiety.   Met: No   Target date: 3-5 days post admission date   As evidenced by: Patient will utilize self rating of anxiety at 3 or below and demonstrated decreased signs of anxiety, or be deemed stable for discharge by MD   7/18: Goal not met: Pt presents with anxious mood and affect.  Pt admitted with anxiety rating of 10.  Pt to show decreased sign of anxiety and a rating of 3 or less before d/c.     Attendees: Patient:    Family:    Physician: Dr. Parke Poisson 02/09/2016 9:30 AM  Nursing: Mayra Neer, Darrol Angel, RN 02/09/2016 9:30 AM  Clinical Social Worker: Tilden Fossa, LCSW 02/09/2016 9:30 AM  Other: Peri Maris, LCSWA; Short, LCSW  02/09/2016 9:30 AM  Other:  02/09/2016 9:30 AM  Other: Lars Pinks, Case Manager 02/09/2016 9:30 AM  Other: Larose Kells, NP 02/09/2016 9:30 AM  Other:    Other:    Other:    Other:     Scribe for Treatment Team:  Tilden Fossa, [place]

## 2016-02-09 NOTE — H&P (Addendum)
Psychiatric Admission Assessment Adult  Patient Identification: Carol [name] MRN:  [id number] Date of Evaluation:  02/09/2016 Chief Complaint:  MDD Recurrent Severe without Psychosis GAD Principal Diagnosis:  Major Depression, Recurrent, no Psychotic Features  Diagnosis:   Patient Active Problem List   Diagnosis Date Noted  . Primary osteoarthritis of right knee [M17.11] 03/27/2015  . Right knee DJD [M17.9] 03/27/2015  . DEPRESSION [F32.9] 06/26/2007  . EXTERNAL HEMORRHOIDS [K64.4] 06/26/2007  . DIVERTICULOSIS, MILD [K57.30] 06/26/2007  . ALLERGY [T78.40XA] 06/26/2007   History of Present Illness: 60 year old  married female. Presented to ED reporting increased anxiety and depression .  States she has chronic depression, and frequent " anxiety attacks". States " I think I have been depressed since my mother died several years ago". States she has been feeling vaguely irritable and has noticed she is short and abrupt with people, which she states " is because I am depressed and I do not feel well ". Describes family stressors as contributing to her depression- states she had a   recent argument with her adult son.States she feels he does not allow him to see her grandson as often as she would want to. Also, reports that a few weeks ago discovered that her husband had been texting another woman . Describes neuro-vegetative symptoms, and states she had recently been having passive SI, no plan , no clear intent, " feeling like nobody cares and I should just die".  Of note, patient states she has taken antidepressants in the past, but stopped about one year ago .  Associated Signs/Symptoms: Depression Symptoms:  depressed mood, anhedonia, insomnia, suicidal thoughts without plan, anxiety, panic attacks, loss of energy/fatigue, decreased appetite, (Hypo) Manic Symptoms:  Denies, except for endorsing some irritability  Anxiety Symptoms: Describes increased anxiety, worry, ruminations about  family stressors as above, and also describes panic attacks Psychotic Symptoms:  Denies  PTSD Symptoms:denies   Total Time spent with patient: 45 minutes  Past Psychiatric History:  One prior psychiatric admission about ten years ago, related to opiate dependence/detox . Reports she has had depression since her mother passed away 9 years ago.  No history of suicide attempts , no history of self cutting. Denies history of psychosis.  Denies history of mania, hypomania.   Is the patient at risk to self? Yes.    Has the patient been a risk to self in the past 6 months? No.  Has the patient been a risk to self within the distant past? No.  Is the patient a risk to others? No.  Has the patient been a risk to others in the past 6 months? No.  Has the patient been a risk to others within the distant past? No.   Prior Inpatient Therapy:  as above  Prior Outpatient Therapy:  not currently, does not have outpatient psychiatrist at this time   Alcohol Screening: 1. How often do you have a drink containing alcohol?: Never 9. Have you or someone else been injured as a result of your drinking?: No 10. Has a relative or friend or a doctor or another health worker been concerned about your drinking or suggested you cut down?: No Alcohol Use Disorder Identification Test Final Score (AUDIT): 0 Substance Abuse History in the last 12 months:   Denies alcohol abuse , denies drug abuse , of note, she is prescribed Xanax and Hydrocodone, and denies any abuse or misuse  Consequences of Substance Abuse: Denies  Previous Psychotropic Medications: states she has been  on several antidepressants in the past, remembers having been on Zoloft, Celexa .  States she had done well on Cymbalta in the past, but developed a rash, and it was felt that Cymbalta could have been implicated .  Psychological Evaluations:  No  Past Medical History:  Reports chronic pain, reports history of UTIs  Past Medical History  Diagnosis  Date  . Thyroid disease   . Complication of anesthesia     pt states has difficulty with being put to sleep   . PONV (postoperative nausea and vomiting)   . Family history of adverse reaction to anesthesia     pts daughter has N&V  . Polycythemia   . History of bronchitis   . Pneumonia     hx of   . Hypothyroidism   . Anxiety   . History of kidney stones   . History of frequent urinary tract infections   . Arthritis   . History of blood transfusion     2006  . Tinnitus   . Falls   . History of chicken pox   . History of measles as a child   . History of mumps as a child   . Hemorrhoids   . History of jaundice   . History of gallstones   . History of blood clots   . Insomnia   . Night sweats   . Fatigue   . Balance problems     Past Surgical History  Procedure Laterality Date  . Back surgery    . Cholecystectomy    . Abdominal hysterectomy    . Knee surgery    . Tonsillectomy    . Left foot surgery       to repair break - 2002  . Menicus tear      bilat;   . Total knee arthroplasty Right 03/27/2015    Procedure: RIGHT TOTAL KNEE ARTHROPLASTY;  Surgeon: Susa Day, MD;  Location: [facility];  Service: Orthopedics;  Laterality: Right;  . Knee arthroscopy Right 01/07/2016    Procedure: ARTHROSCOPY RIGHT KNEE, DEBRIDEMENT OF ARTHROFIBROSIS, AND EXAM UNDER ANESTHESIA;  Surgeon: Susa Day, MD;  Location: [facility];  Service: Orthopedics;  Laterality: Right;   Family History: mother passed away 9 years ago, from complications of COPD , father died when she was 67 from MI. Has a half sister . Family History  Problem Relation Age of Onset  . Hypertension Mother   . Heart attack Father 75   Family Psychiatric  History:  States mother had overdosed in a possible suicide attempt years ago.  Tobacco Screening: does not smoke  Social History: married x 26 years , has two adult children, reports family stressors as noted above, no legal issues. On disability History  Alcohol  Use No     History  Drug Use No    Additional Social History:  Allergies:   Allergies  Allergen Reactions  . Cymbalta [Duloxetine Hcl] Rash  . Naproxen Rash  . Acetaminophen Nausea And Vomiting  . Celebrex [Celecoxib] Itching  . Povidone Iodine Hives  . Codeine Rash  . Doxycycline Rash   Lab Results:  Results for orders placed or performed during the hospital encounter of 02/08/16 (from the past 48 hour(s))  Lipase, blood     Status: None   Collection Time: 02/08/16  4:17 PM  Result Value Ref Range   Lipase 19 11 - 51 U/L  Comprehensive metabolic panel     Status: Abnormal   Collection Time: 02/08/16  4:17 PM  Result Value Ref Range   Sodium 140 135 - 145 mmol/L   Potassium 3.7 3.5 - 5.1 mmol/L   Chloride 103 101 - 111 mmol/L   CO2 27 22 - 32 mmol/L   Glucose, Bld 125 (H) 65 - 99 mg/dL   BUN <5 (L) 6 - 20 mg/dL   Creatinine, Ser 0.88 0.44 - 1.00 mg/dL   Calcium 9.5 8.9 - 10.3 mg/dL   Total Protein 7.7 6.5 - 8.1 g/dL   Albumin 4.2 3.5 - 5.0 g/dL   AST 33 15 - 41 U/L   ALT 22 14 - 54 U/L   Alkaline Phosphatase 93 38 - 126 U/L   Total Bilirubin 0.7 0.3 - 1.2 mg/dL   GFR calc non Af Amer >60 >60 mL/min   GFR calc Af Amer >60 >60 mL/min    Comment: (NOTE) The eGFR has been calculated using the CKD EPI equation. This calculation has not been validated in all clinical situations. eGFR's persistently <60 mL/min signify possible Chronic Kidney Disease.    Anion gap 10 5 - 15  CBC     Status: Abnormal   Collection Time: 02/08/16  4:17 PM  Result Value Ref Range   WBC 8.8 4.0 - 10.5 K/uL   RBC 5.25 (H) 3.87 - 5.11 MIL/uL   Hemoglobin 15.5 (H) 12.0 - 15.0 g/dL   HCT 47.0 (H) 36.0 - 46.0 %   MCV 89.5 78.0 - 100.0 fL   MCH 29.5 26.0 - 34.0 pg   MCHC 33.0 30.0 - 36.0 g/dL   RDW 13.2 11.5 - 15.5 %   Platelets 382 150 - 400 K/uL  Urinalysis, Routine w reflex microscopic     Status: Abnormal   Collection Time: 02/09/16 12:17 AM  Result Value Ref Range   Color, Urine  AMBER (A) YELLOW    Comment: BIOCHEMICALS MAY BE AFFECTED BY COLOR   APPearance TURBID (A) CLEAR   Specific Gravity, Urine 1.022 1.005 - 1.030   pH 5.0 5.0 - 8.0   Glucose, UA NEGATIVE NEGATIVE mg/dL   Hgb urine dipstick NEGATIVE NEGATIVE   Bilirubin Urine MODERATE (A) NEGATIVE   Ketones, ur 15 (A) NEGATIVE mg/dL   Protein, ur NEGATIVE NEGATIVE mg/dL   Nitrite NEGATIVE NEGATIVE   Leukocytes, UA SMALL (A) NEGATIVE  Urine microscopic-add on     Status: Abnormal   Collection Time: 02/09/16 12:17 AM  Result Value Ref Range   Squamous Epithelial / LPF 6-30 (A) NONE SEEN   WBC, UA 6-30 0 - 5 WBC/hpf   RBC / HPF 0-5 0 - 5 RBC/hpf   Bacteria, UA MANY (A) NONE SEEN    Blood Alcohol level:  Lab Results  Component Value Date   ETH 131* 02/12/2011   ETH  12/13/2009    <5        LOWEST DETECTABLE LIMIT FOR SERUM ALCOHOL IS 5 mg/dL FOR MEDICAL PURPOSES ONLY    Metabolic Disorder Labs:  Lab Results  Component Value Date   HGBA1C 5.6 05/06/2011   MPG 114 05/06/2011   MPG 119 06/11/2007   No results found for: PROLACTIN Lab Results  Component Value Date   CHOL 169 05/06/2011   TRIG 183* 05/06/2011   HDL 44 05/06/2011   CHOLHDL 3.8 05/06/2011   VLDL 37 05/06/2011   LDLCALC 88 05/06/2011   LDLCALC * 06/12/2007    100        Total Cholesterol/HDL:CHD Risk Coronary Heart Disease Risk Table  Men   Women  1/2 Average Risk   3.4   3.3    Current Medications: Current Facility-Administered Medications  Medication Dose Route Frequency Provider Last Rate Last Dose  . alum & mag hydroxide-simeth (MAALOX/MYLANTA) 200-200-20 MG/5ML suspension 30 mL  30 mL Oral Q4H PRN Laverle Hobby, PA-C      . cyclobenzaprine (FLEXERIL) tablet 10 mg  10 mg Oral Daily PRN Laverle Hobby, PA-C   10 mg at 02/09/16 0225  . docusate sodium (COLACE) capsule 100 mg  100 mg Oral BID PRN Laverle Hobby, PA-C      . gabapentin (NEURONTIN) capsule 300 mg  300 mg Oral TID Laverle Hobby,  PA-C   300 mg at 02/09/16 0740  . hydrOXYzine (ATARAX/VISTARIL) tablet 25 mg  25 mg Oral Q6H PRN Laverle Hobby, PA-C   25 mg at 02/09/16 0225  . levothyroxine (SYNTHROID, LEVOTHROID) tablet 112 mcg  112 mcg Oral QAC breakfast Laverle Hobby, PA-C   112 mcg at 02/09/16 0615  . magnesium hydroxide (MILK OF MAGNESIA) suspension 30 mL  30 mL Oral Daily PRN Laverle Hobby, PA-C      . promethazine (PHENERGAN) tablet 25 mg  25 mg Oral PRN Laverle Hobby, PA-C      . traZODone (DESYREL) tablet 50 mg  50 mg Oral QHS,MR X 1 Spencer E Simon, PA-C      . vitamin B-12 (CYANOCOBALAMIN) tablet 1,000 mcg  1,000 mcg Oral Daily Laverle Hobby, PA-C   1,000 mcg at 02/09/16 1610   PTA Medications: Prescriptions prior to admission  Medication Sig Dispense Refill Last Dose  . ALPRAZolam (XANAX) 0.5 MG tablet Take 1 mg by mouth at bedtime as needed for anxiety.    02/07/2016  . aspirin EC 325 MG tablet Take 1 tablet (325 mg total) by mouth daily. 30 tablet 0 02/08/2016 at Unknown time  . cyclobenzaprine (FLEXERIL) 10 MG tablet Take 10 mg by mouth daily as needed for muscle spasms.   2 02/08/2016  . docusate sodium (COLACE) 100 MG capsule Take 1 capsule (100 mg total) by mouth 2 (two) times daily as needed for mild constipation. (Patient taking differently: Take 100 mg by mouth daily as needed for mild constipation. ) 30 capsule 1 02/02/2016  . gabapentin (NEURONTIN) 300 MG capsule Take 300 mg by mouth 3 (three) times daily.   02/08/2016  . levothyroxine (SYNTHROID, LEVOTHROID) 112 MCG tablet Take 112 mcg by mouth daily before breakfast.  6 02/08/2016  . naproxen sodium (ALEVE) 220 MG tablet Take 440 mg by mouth daily as needed (for pain).   02/07/2016  . oxybutynin (DITROPAN) 5 MG tablet Take 10 mg by mouth daily as needed (for sweating in the head).   02/01/2016  . oxyCODONE (OXY IR/ROXICODONE) 5 MG immediate release tablet Take 1-2 tablets (5-10 mg total) by mouth every 4 (four) hours as needed for severe pain. 40  tablet 0 02/08/2016  . promethazine (PHENERGAN) 25 MG tablet Take 25 mg by mouth every 6 (six) hours as needed for nausea or vomiting.   6 02/08/2016  . vitamin B-12 (CYANOCOBALAMIN) 500 MCG tablet Take 1,000 mcg by mouth daily.   02/08/2016    Musculoskeletal: Strength & Muscle Tone: within normal limits Gait & Station: normal Patient leans: N/A  Psychiatric Specialty Exam: Physical Exam  Review of Systems  Constitutional: Negative.   HENT: Negative.   Eyes: Negative.   Respiratory: Negative.   Cardiovascular: Negative.   Gastrointestinal:  Positive for vomiting. Negative for heartburn, nausea, diarrhea and blood in stool.       No vomiting today   Genitourinary: Positive for dysuria and frequency.  Skin: Negative.   Neurological: Negative for seizures.  Endo/Heme/Allergies: Negative.   Psychiatric/Behavioral: Positive for depression. The patient is nervous/anxious.   All other systems reviewed and are negative. no current symptoms of BZD or opiate WDL. Vitals stable   Blood pressure 115/56, pulse 98, temperature 97.6 F (36.4 C), temperature source Oral, resp. rate 18, height 5' 5.5" (1.664 m), weight 234 lb (106.142 kg).Body mass index is 38.33 kg/(m^2).  General Appearance: Fairly Groomed  Eye Contact:  Good  Speech:  Normal Rate  Volume:  Normal  Mood:  Depressed  Affect:  Constricted  Thought Process:  Linear  Orientation:  Full (Time, Place, and Person)  Thought Content:  denies hallucinations, no delusions, not internally preoccupied   Suicidal Thoughts:  No- at this time denies any suicidal ideations, contracts for safety on the unit   Homicidal Thoughts:  No- denies any homicidal ideations   Memory:  recent and remote grossly intact   Judgement:  Fair  Insight:  Fair  Psychomotor Activity:  Decreased  Concentration:  Concentration: Good and Attention Span: Good  Recall:  Good  Fund of Knowledge:  Good  Language:  Good  Akathisia:  Negative  Handed:  Right   AIMS (if indicated):     Assets:  Communication Skills Desire for Improvement Housing Resilience  ADL's:  Intact  Cognition:  WNL  Sleep:  Number of Hours: 2.5       Treatment Plan Summary: Daily contact with patient to assess and evaluate symptoms and progress in treatment, Medication management, Plan inpatient admission and medications as below   Observation Level/Precautions:  15 minute checks  Laboratory:  TSH  Psychotherapy:  Milieu, support   Medications:  We discussed treatment options, agrees to [facility] XR trial.  States she usually takes Xanax between 0.5- 1 mgr QHS , no current symptoms of WDL- will start ATIVAN PRNs for potential withdrawal symptoms   Consultations:  As needed  Discharge Concerns:  -  Estimated LOS: 5 days   Other:     I certify that inpatient services furnished can reasonably be expected to improve the patient's condition.    Neita Garnet, MD 7/18/201710:38 AM

## 2016-02-09 NOTE — Progress Notes (Signed)
D: Pt denies SI/HI/AVH. Pt is pleasant and cooperative. Pt stated she was feeling a little better, pt stated she was having some issues dealing with everything going on but feels hopeful about the future.   A: Pt was offered support and encouragement. Pt was given scheduled medications. Pt was encourage to attend groups. Q 15 minute checks were done for safety.   R:Pt attends groups and interacts well with peers and staff. Pt is taking medication. Pt has no complaints at this time .Pt receptive to treatment and safety maintained on unit.

## 2016-02-09 NOTE — [facility] Suicide Risk Assessment (Signed)
[facility] Admission Suicide Risk Assessment   Nursing information obtained from:   patient and chart  Demographic factors:   60 year old female , married, on disability  Current Mental Status:   see below  Loss Factors:   family stressors Historical Factors:   history of depression  Risk Reduction Factors:   married , sense of responsibility to family   Total Time spent with patient: 45 minutes Principal Problem:  Major Depression Diagnosis:   Patient Active Problem List   Diagnosis Date Noted  . Primary osteoarthritis of right knee [M17.11] 03/27/2015  . Right knee DJD [M17.9] 03/27/2015  . DEPRESSION [F32.9] 06/26/2007  . EXTERNAL HEMORRHOIDS [K64.4] 06/26/2007  . DIVERTICULOSIS, MILD [K57.30] 06/26/2007  . ALLERGY [T78.40XA] 06/26/2007     Continued Clinical Symptoms:  Alcohol Use Disorder Identification Test Final Score (AUDIT): 0 The "Alcohol Use Disorders Identification Test", Guidelines for Use in Primary Care, Second Edition.  World Pharmacologist [facility]). Score between 0-7:  no or low risk or alcohol related problems. Score between 8-15:  moderate risk of alcohol related problems. Score between 16-19:  high risk of alcohol related problems. Score 20 or above:  warrants further diagnostic evaluation for alcohol dependence and treatment.   CLINICAL FACTORS:  60 year old married female, presents for worsening depression, with recent passive SI , and significant neuro-vegetative symptoms of depression. Has been facing significant family stressors , Of note, has been on Xanax and Oxycodone for years  ( states she takes low dose and  a few days a week ) - at this time not presenting with any symptoms of withdrawal    Psychiatric Specialty Exam: Physical Exam  ROS  Blood pressure 115/56, pulse 98, temperature 97.6 F (36.4 C), temperature source Oral, resp. rate 18, height 5' 5.5" (1.664 m), weight 234 lb (106.142 kg).Body mass index is 38.33 kg/(m^2).   see admit note MSE                                                          COGNITIVE FEATURES THAT CONTRIBUTE TO RISK:  Closed-mindedness and Loss of executive function    SUICIDE RISK:   Moderate:  Frequent suicidal ideation with limited intensity, and duration, some specificity in terms of plans, no associated intent, good self-control, limited dysphoria/symptomatology, some risk factors present, and identifiable protective factors, including available and accessible social support.  PLAN OF CARE: Patient will be admitted to inpatient psychiatric unit for stabilization and safety. Will provide and encourage milieu participation. Provide medication management and maked adjustments as needed.  Will follow daily.    I certify that inpatient services furnished can reasonably be expected to improve the patient's condition.   Neita Garnet, MD 02/09/2016, 11:26 AM

## 2016-02-09 NOTE — Progress Notes (Signed)
Recreation Therapy Notes  Animal-Assisted Activity (AAA) Program Checklist/Progress Notes Patient Eligibility Criteria Checklist & Daily Group note for Rec TxIntervention  Date: 07.18.2017 Time: 2:45pm Location: [address]    AAA/T Program Assumption of Risk Form signed by Patient/ or Parent Legal Guardian Yes  Patient is free of allergies or sever asthma Yes  Patient reports no fear of animals Yes  Patient reports no history of cruelty to animals Yes  Patient understands his/her participation is voluntary Yes  Patient washes hands before animal contact Yes  Patient washes hands after animal contact Yes  Behavioral Response: Engaged, Attentive   Education:Hand Washing, Appropriate Animal Interaction   Education Outcome: Acknowledges education.   Clinical Observations/Feedback: Patient attended session and interacted appropriately with therapy dog and peers. Patient asked appropriate questions about therapy dog and his training.    Laureen Ochs Blanchfield, LRT/CTRS        Blanchfield, Denise L 02/09/2016 3:14 PM

## 2016-02-09 NOTE — Progress Notes (Signed)
D: Patient presents with flat, blunted affect.  Her mood is sad and depressed.  She rates her depression as an 8; hopelessness as a 4; anxiety as a 10.  She denies any withdrawal symptoms stating, "what would I feel like if I was withdrawing?  I had my last xanax 2 days ago."  She has back and knee pain.  Patient recently underwent surgery to remove scar tissue from her right knee after a total knee replacement.  Patient's goal today is to work on "anger control, sadness and loneliness."  She denies having self harm thoughts today; she denies HI/AVH.  Patient's sleep is poor.  She states, "I haven't been able to eat anything in days due to no appetite."  Patient is pleasant and is observed in day room interacting with her peers. A: Continue to monitor medication management and MD orders.  Safety checks completed every 15 minutes per protocol. Offer support and encouragement as needed. R: Patient is receptive to staff; her behavior is appropriate.

## 2016-02-09 NOTE — Tx Team (Signed)
Initial Interdisciplinary Treatment Plan   PATIENT STRESSORS: Health problems Marital or family conflict   PATIENT STRENGTHS: Curator fund of knowledge Motivation for treatment/growth Supportive family/friends   PROBLEM LIST: Problem List/Patient Goals Date to be addressed Date deferred Reason deferred Estimated date of resolution  Risk for suicide      depression      anger      "work on depression, anger"                                     DISCHARGE CRITERIA:  Improved stabilization in mood, thinking, and/or behavior Verbal commitment to aftercare and medication compliance  PRELIMINARY DISCHARGE PLAN: Attend aftercare/continuing care group Outpatient therapy  PATIENT/FAMIILY INVOLVEMENT: This treatment plan has been presented to and reviewed with the patient, Carol [name].  The patient and family have been given the opportunity to ask questions and make suggestions.  Carol [name] A 02/09/2016, 4:20 AM

## 2016-02-09 NOTE — Progress Notes (Signed)
Patient ID: Carol [name], female   DOB: [birth year], 60 y.o.   MRN: [id number]  Admission Note:  D:59 yr female who presents VC in no acute distress for the treatment of SI and Depression. Pt appears flat and depressed. Pt was calm and cooperative with admission process. Pt presents with passive SI and contracts for safety upon admission. Pt denies AVH . Pt stated she has been decompensating since february when she found out her husband of 40 yrs was texting some woman. Pt said 2 weeks ago she had a fight with her son over them purchasing an RV together which increased her depression. Pt said she has been sleeping and not been motivated to get up and out to do things.    A: Skin was assessed and found to be clear of any abnormal marks apart from a scars on abdomen, Back, R-knee. PT searched and no contraband found, POC and unit policies explained and understanding verbalized. Consents obtained. Food and fluids offered, and fluids accepted.   R:Pt had no additional questions or concerns.

## 2016-02-10 DIAGNOSIS — F332 Major depressive disorder, recurrent severe without psychotic features: Secondary | ICD-10-CM

## 2016-02-10 MED ORDER — NAPROXEN 500 MG PO TABS
500.0000 mg | ORAL_TABLET | Freq: Two times a day (BID) | ORAL | Status: DC
  Administered 2016-02-10 – 2016-02-11 (×2): 500 mg via ORAL
  Filled 2016-02-10 (×7): qty 1

## 2016-02-10 NOTE — Progress Notes (Signed)
Recreation Therapy Notes  Date: 07.19.2017 Time: 9:30am Location: [address] Group Room   Group Topic: Stress Management  Goal Area(s) Addresses:  Patient will actively participate in stress management techniques presented during session.   Behavioral Response: Did not attend.   Laureen Ochs [place], LRT/CTRS        Lane Hacker 02/10/2016 3:19 PM

## 2016-02-10 NOTE — Progress Notes (Signed)
Pt had and allergy to Naproxen on her chart earlier, but pt stated" I take 2 aleve every day". Pt stated she did not have an allergy to Naproxen.

## 2016-02-10 NOTE — Progress Notes (Signed)
[facility] MD Progress Note  02/10/2016 1:08 PM Carol [name]  MRN:  [id number]  Subjective:  Carol [name] reports, "I came to this hospital because I was feeling very depressed, anxious & hopeless because my son told me out of anger that he is done with me. I got very concerned knowing how close I'm with my son. The thought of not having him in my life set me off to an unknown zone. I found myself at the deep end. Today, I can say that I'm beginning to feel some better. I'm not all there yet because I'm still depressed & anxious. I started my medications. I was able to sleep last night more than I had slept in the last few weeks. I feel like I'm on the road to recovery, however, the depression is still lingering. I'm not out of the woods yet. I would like to feeling like liking myself again soon.  Objective: Carol [name] is seen, chart reviewed. She is alert, oriented x 3 & aware of situation. She presents today as still exhibiting symptoms of depression, (sad facial expressions, flat affect & a lot of ruminations). She is visible on the unit, attending group sessions. She says she is still feeling very depressed, however, hoping her medications will eventually kick in & help. Carol [name] denies any adverse effects of medications. She denies any new symptoms. She is provided with support & encouragement.  Principal Problem: Major depressive disorder, recurrent episode, severe (HCC)  Diagnosis:   Patient Active Problem List   Diagnosis Date Noted  . Primary osteoarthritis of right knee [M17.11] 03/27/2015  . Right knee DJD [M17.9] 03/27/2015  . DEPRESSION [F32.9] 06/26/2007  . EXTERNAL HEMORRHOIDS [K64.4] 06/26/2007  . DIVERTICULOSIS, MILD [K57.30] 06/26/2007  . ALLERGY [T78.40XA] 06/26/2007   Total Time spent with patient: 25 minutes  Past Psychiatric History: Major depression  Past Medical History:  Past Medical History  Diagnosis Date  . Thyroid disease   . Complication of anesthesia     pt states has difficulty with  being put to sleep   . PONV (postoperative nausea and vomiting)   . Family history of adverse reaction to anesthesia     pts daughter has N&V  . Polycythemia   . History of bronchitis   . Pneumonia     hx of   . Hypothyroidism   . Anxiety   . History of kidney stones   . History of frequent urinary tract infections   . Arthritis   . History of blood transfusion     2006  . Tinnitus   . Falls   . History of chicken pox   . History of measles as a child   . History of mumps as a child   . Hemorrhoids   . History of jaundice   . History of gallstones   . History of blood clots   . Insomnia   . Night sweats   . Fatigue   . Balance problems     Past Surgical History  Procedure Laterality Date  . Back surgery    . Cholecystectomy    . Abdominal hysterectomy    . Knee surgery    . Tonsillectomy    . Left foot surgery       to repair break - 2002  . Menicus tear      bilat;   . Total knee arthroplasty Right 03/27/2015    Procedure: RIGHT TOTAL KNEE ARTHROPLASTY;  Surgeon: Jeffrey Beane, MD;  Location: [facility];  Service:   Orthopedics;  Laterality: Right;  . Knee arthroscopy Right 01/07/2016    Procedure: ARTHROSCOPY RIGHT KNEE, DEBRIDEMENT OF ARTHROFIBROSIS, AND EXAM UNDER ANESTHESIA;  Surgeon: Susa Day, MD;  Location: [facility];  Service: Orthopedics;  Laterality: Right;   Family History:  Family History  Problem Relation Age of Onset  . Hypertension Mother   . Heart attack Father 63   Family Psychiatric  History: See H&P  Social History:  History  Alcohol Use No     History  Drug Use No    Social History   Social History  . Marital Status: Married    Spouse Name: N/A  . Number of Children: N/A  . Years of Education: N/A   Social History Main Topics  . Smoking status: Never Smoker   . Smokeless tobacco: Never Used  . Alcohol Use: No  . Drug Use: No  . Sexual Activity: Yes    Birth Control/ Protection: None   Other Topics Concern  . None   Social  History Narrative   Additional Social History:   Sleep: 6.5 per documentation  Appetite:  "Improving"  Current Medications: Current Facility-Administered Medications  Medication Dose Route Frequency Provider Last Rate Last Dose  . alum & mag hydroxide-simeth (MAALOX/MYLANTA) 200-200-20 MG/5ML suspension 30 mL  30 mL Oral Q4H PRN Laverle Hobby, PA-C      . cyclobenzaprine (FLEXERIL) tablet 10 mg  10 mg Oral Daily PRN Laverle Hobby, PA-C   10 mg at 02/10/16 0807  . docusate sodium (COLACE) capsule 100 mg  100 mg Oral BID PRN Laverle Hobby, PA-C      . gabapentin (NEURONTIN) capsule 300 mg  300 mg Oral TID Laverle Hobby, PA-C   300 mg at 02/10/16 1139  . hydrOXYzine (ATARAX/VISTARIL) tablet 25 mg  25 mg Oral Q6H PRN Jenne Campus, MD   25 mg at 02/09/16 2231  . levothyroxine (SYNTHROID, LEVOTHROID) tablet 112 mcg  112 mcg Oral QAC breakfast Laverle Hobby, PA-C   112 mcg at 02/10/16 1779  . loperamide (IMODIUM) capsule 2-4 mg  2-4 mg Oral PRN Jenne Campus, MD      . LORazepam (ATIVAN) tablet 1 mg  1 mg Oral Q6H PRN Jenne Campus, MD   1 mg at 02/09/16 2231  . magnesium hydroxide (MILK OF MAGNESIA) suspension 30 mL  30 mL Oral Daily PRN Laverle Hobby, PA-C      . ondansetron (ZOFRAN-ODT) disintegrating tablet 4 mg  4 mg Oral Q6H PRN Jenne Campus, MD      . promethazine (PHENERGAN) tablet 25 mg  25 mg Oral PRN Laverle Hobby, PA-C      . traZODone (DESYREL) tablet 50 mg  50 mg Oral QHS PRN Jenne Campus, MD   50 mg at 02/09/16 2231  . venlafaxine XR (EFFEXOR-XR) 24 hr capsule 37.5 mg  37.5 mg Oral Q breakfast Myer Peer Cobos, MD   37.5 mg at 02/10/16 0804  . vitamin B-12 (CYANOCOBALAMIN) tablet 1,000 mcg  1,000 mcg Oral Daily Laverle Hobby, PA-C   1,000 mcg at 02/10/16 3903   Lab Results:  Results for orders placed or performed during the hospital encounter of 02/08/16 (from the past 48 hour(s))  Lipase, blood     Status: None   Collection Time: 02/08/16  4:17  PM  Result Value Ref Range   Lipase 19 11 - 51 U/L  Comprehensive metabolic panel     Status: Abnormal  Collection Time: 02/08/16  4:17 PM  Result Value Ref Range   Sodium 140 135 - 145 mmol/L   Potassium 3.7 3.5 - 5.1 mmol/L   Chloride 103 101 - 111 mmol/L   CO2 27 22 - 32 mmol/L   Glucose, Bld 125 (H) 65 - 99 mg/dL   BUN <5 (L) 6 - 20 mg/dL   Creatinine, Ser 0.88 0.44 - 1.00 mg/dL   Calcium 9.5 8.9 - 10.3 mg/dL   Total Protein 7.7 6.5 - 8.1 g/dL   Albumin 4.2 3.5 - 5.0 g/dL   AST 33 15 - 41 U/L   ALT 22 14 - 54 U/L   Alkaline Phosphatase 93 38 - 126 U/L   Total Bilirubin 0.7 0.3 - 1.2 mg/dL   GFR calc non Af Amer >60 >60 mL/min   GFR calc Af Amer >60 >60 mL/min    Comment: (NOTE) The eGFR has been calculated using the CKD EPI equation. This calculation has not been validated in all clinical situations. eGFR's persistently <60 mL/min signify possible Chronic Kidney Disease.    Anion gap 10 5 - 15  CBC     Status: Abnormal   Collection Time: 02/08/16  4:17 PM  Result Value Ref Range   WBC 8.8 4.0 - 10.5 K/uL   RBC 5.25 (H) 3.87 - 5.11 MIL/uL   Hemoglobin 15.5 (H) 12.0 - 15.0 g/dL   HCT 47.0 (H) 36.0 - 46.0 %   MCV 89.5 78.0 - 100.0 fL   MCH 29.5 26.0 - 34.0 pg   MCHC 33.0 30.0 - 36.0 g/dL   RDW 13.2 11.5 - 15.5 %   Platelets 382 150 - 400 K/uL  Urinalysis, Routine w reflex microscopic     Status: Abnormal   Collection Time: 02/09/16 12:17 AM  Result Value Ref Range   Color, Urine AMBER (A) YELLOW    Comment: BIOCHEMICALS MAY BE AFFECTED BY COLOR   APPearance TURBID (A) CLEAR   Specific Gravity, Urine 1.022 1.005 - 1.030   pH 5.0 5.0 - 8.0   Glucose, UA NEGATIVE NEGATIVE mg/dL   Hgb urine dipstick NEGATIVE NEGATIVE   Bilirubin Urine MODERATE (A) NEGATIVE   Ketones, ur 15 (A) NEGATIVE mg/dL   Protein, ur NEGATIVE NEGATIVE mg/dL   Nitrite NEGATIVE NEGATIVE   Leukocytes, UA SMALL (A) NEGATIVE  Urine microscopic-add on     Status: Abnormal   Collection Time:  02/09/16 12:17 AM  Result Value Ref Range   Squamous Epithelial / LPF 6-30 (A) NONE SEEN   WBC, UA 6-30 0 - 5 WBC/hpf   RBC / HPF 0-5 0 - 5 RBC/hpf   Bacteria, UA MANY (A) NONE SEEN    Blood Alcohol level:  Lab Results  Component Value Date   ETH 131* 02/12/2011   ETH  12/13/2009    <5        LOWEST DETECTABLE LIMIT FOR SERUM ALCOHOL IS 5 mg/dL FOR MEDICAL PURPOSES ONLY    Metabolic Disorder Labs: Lab Results  Component Value Date   HGBA1C 5.6 05/06/2011   MPG 114 05/06/2011   MPG 119 06/11/2007   No results found for: PROLACTIN Lab Results  Component Value Date   CHOL 169 05/06/2011   TRIG 183* 05/06/2011   HDL 44 05/06/2011   CHOLHDL 3.8 05/06/2011   VLDL 37 05/06/2011   LDLCALC 88 05/06/2011   LDLCALC * 06/12/2007    100        Total Cholesterol/HDL:CHD Risk Coronary Heart Disease Risk Table                       Men   Women  1/2 Average Risk   3.4   3.3   Physical Findings: AIMS:  , ,  ,  ,    CIWA:  CIWA-Ar Total: 2 COWS:     Musculoskeletal: Strength & Muscle Tone: within normal limits Gait & Station: normal Patient leans: N/A  Psychiatric Specialty Exam: Physical Exam  ROS  Blood pressure 113/73, pulse 86, temperature 97.5 F (36.4 C), temperature source Oral, resp. rate 18, height 5' 5.5" (1.664 m), weight 106.142 kg (234 lb).Body mass index is 38.33 kg/(m^2).  General Appearance: Fairly Groomed  Eye Contact: Good  Speech: Normal Rate  Volume: Normal  Mood: Depressed  Affect: Constricted  Thought Process: Linear  Orientation: Full (Time, Place, and Person)  Thought Content: denies hallucinations, no delusions, not internally preoccupied   Suicidal Thoughts: No- at this time denies any suicidal ideations, contracts for safety on the unit   Homicidal Thoughts: No- denies any homicidal ideations   Memory: recent and remote grossly intact   Judgement: Fair  Insight: Fair  Psychomotor Activity: Decreased   Concentration: Concentration: Good and Attention Span: Good  Recall: Good  Fund of Knowledge: Good  Language: Good  Akathisia: Negative  Handed: Right  AIMS (if indicated):    Assets: Communication Skills Desire for Improvement Housing Resilience  ADL's: Intact  Cognition: WNL  Sleep: Number of Hours: 6.5         Treatment Plan Summary: Daily contact with patient to assess and evaluate symptoms and progress in treatment and Medication management:1. Continue crisis management, mood stabilization & relapse prevention.. 2. Continue current medication management to reduce current symptoms to base line and improve the  patient's overall level of functioning;  Continue the Effexor XR 37.5 mg for depression,  Continue Lorazepam 1.0 mg for severe anxiety. Continue the Hydroxyzine 25 mg prn  for anxiety. Continue the Gabapentin 300 mg for agitation. Continue the Trazodone 50 mg for insomnia. 3. Treat health problems as indicated;  Resumed Flexeril 10 mg for muscle spasms, colace 100 mg for constipation, Synthroid 112 mcg for low thyroid hormone. 4. Develop treatment plan to enhance medication adeherance upon discharge & prevent the need for  readmission. 5. Psycho-social education regarding mental & self care. 6. Will continue PRN treatment regimen per protocols. 7.Monitor vital signs, review pertinent findings or order labs when necessary. 8. Social Worker to work on discharge disposition  Encarnacion Slates, NP , PMHNP, FNP-BC  02/10/2016, 1:08 PM Agree with NP progress note as above

## 2016-02-10 NOTE — [facility] Group Notes (Signed)
   [facility] LCSW Aftercare Discharge Planning Group Note  02/10/2016  8:45 AM   Participation Quality: Alert, Appropriate and Oriented  Mood/Affect: Appropriate  Depression Rating: 3  Anxiety Rating: Reports little to no anxiety today  Thoughts of Suicide: Pt denies SI/HI  Will you contract for safety? Yes  Current AVH: Pt denies  Plan for Discharge/Comments: Pt attended discharge planning group and actively participated in group. CSW provided pt with today's workbook. Patient plans to return home to follow up with outpatient services- would like referral.  Transportation Means: Pt reports access to transportation  Supports: No supports mentioned at this time  Tilden Fossa, MSW, [place] Social Worker [facility] [phone]

## 2016-02-10 NOTE — Progress Notes (Signed)
Patient ID: Carol [name], female   DOB: Oct 19, 1955, 60 y.o.   MRN: [id number]  Pt currently presents with an anxious affect and restless behavior. Per self inventory, pt rates depression at a 5, hopelessness 0 and anxiety 2. Pt's daily goal is to "learn how to be calm and know worry about the little things" and they intend to do so by "attend all meeting and talk with doctor." Pt also reports "problems peeing", reports urinating once yesterday.Pt also reports pain in stomach, 7/10. Pt reports good sleep, a good appetite, normal energy and good concentration. Pt interacts positively with peers in milieu.   Pt provided with medications per providers orders. Pt's labs and vitals were monitored throughout the day. Pt supported emotionally and encouraged to express concerns and questions. Pt educated on medications. Providers notified of pt physical concerns.  Pt's safety ensured with 15 minute and environmental checks. Pt currently denies SI/HI and A/V hallucinations. Pt verbally agrees to seek staff if SI/HI or A/VH occurs and to consult with staff before acting on any harmful thoughts. Will continue POC.

## 2016-02-10 NOTE — Progress Notes (Signed)
Adult Psychoeducational Group Note  Date:  02/10/2016 Time:  9:07 PM  Group Topic/Focus:  Wrap-Up Group:   The focus of this group is to help patients review their daily goal of treatment and discuss progress on daily workbooks.  Participation Level:  Active  Participation Quality:  Appropriate  Affect:  Appropriate  Cognitive:  Alert  Insight: Appropriate  Engagement in Group:  Engaged  Modes of Intervention:  Discussion  Additional Comments:  Pt rated her day 10/10. Her goal for tomorrow is to talk to the doctor and discuss discharge plans.   Wynelle Fanny R 02/10/2016, 9:07 PM

## 2016-02-11 DIAGNOSIS — F332 Major depressive disorder, recurrent severe without psychotic features: Secondary | ICD-10-CM | POA: Diagnosis not present

## 2016-02-11 LAB — URINALYSIS, ROUTINE W REFLEX MICROSCOPIC
Bilirubin Urine: NEGATIVE
Glucose, UA: NEGATIVE mg/dL
Hgb urine dipstick: NEGATIVE
Ketones, ur: NEGATIVE mg/dL
Leukocytes, UA: NEGATIVE
Nitrite: NEGATIVE
Protein, ur: NEGATIVE mg/dL
Specific Gravity, Urine: 1.02 (ref 1.005–1.030)
pH: 5.5 (ref 5.0–8.0)

## 2016-02-11 MED ORDER — NITROFURANTOIN MACROCRYSTAL 50 MG PO CAPS
50.0000 mg | ORAL_CAPSULE | Freq: Four times a day (QID) | ORAL | Status: DC
  Administered 2016-02-11 – 2016-02-12 (×3): 50 mg via ORAL
  Filled 2016-02-11 (×7): qty 1
  Filled 2016-02-11 (×4): qty 28

## 2016-02-11 MED ORDER — VENLAFAXINE HCL ER 75 MG PO CP24
75.0000 mg | ORAL_CAPSULE | Freq: Every day | ORAL | Status: DC
  Administered 2016-02-12: 75 mg via ORAL
  Filled 2016-02-11: qty 1
  Filled 2016-02-11: qty 7
  Filled 2016-02-11: qty 1

## 2016-02-11 MED ORDER — TRAZODONE HCL 50 MG PO TABS
50.0000 mg | ORAL_TABLET | Freq: Every day | ORAL | Status: DC
  Administered 2016-02-11: 50 mg via ORAL
  Filled 2016-02-11: qty 1
  Filled 2016-02-11: qty 7
  Filled 2016-02-11: qty 1

## 2016-02-11 MED ORDER — NAPROXEN 500 MG PO TABS
500.0000 mg | ORAL_TABLET | Freq: Two times a day (BID) | ORAL | Status: DC | PRN
  Administered 2016-02-11: 500 mg via ORAL
  Filled 2016-02-11: qty 6
  Filled 2016-02-11: qty 1

## 2016-02-11 NOTE — Progress Notes (Signed)
Nutrition Education Note  Pt attended group focusing on general, healthful nutrition education   RD emphasized the importance of eating regular meals and snacks throughout the day. Consuming sugar-free beverages and incorporating fruits and vegetables into diet when possible. Provided examples of healthy snacks. Patient encouraged to leave group with a goal to improve nutrition/healthy eating.   Diet Order: Diet regular Room service appropriate?: Yes; Fluid consistency:: Thin Pt is also offered choice of unit snacks mid-morning and mid-afternoon.  Pt is eating as desired.   If additional nutrition issues arise, please consult RD.   Jarome Matin, MS, RD, LDN Inpatient Clinical Dietitian Pager # [phone] After hours/weekend pager # [phone]

## 2016-02-11 NOTE — Progress Notes (Signed)
D:  Patient's self inventory sheet, patient sleeps good, sleep medication is helpful.  Good appetite, normal energy level, good concentration.  Rated depression #1, denied hopeless and anxiety.  Denied withdrawals.  Patient has experienced pain in past 24 hours, nausea.  Denied SI.  Physical problems, worst pain #5, pain medication has helped "some".  Goal is worry, anger, relationship with family.  Plans to attend group, see MD, think of ways to make things work for her.  Does have discharge plans. A:  Medications administered per MD orders.  Emotional support and encouragement given patient. R:  Denied SI and HI, contracts for safety.  Denied A/V hallucinations.  Safety maintained with 15 minute checks. Patient stated she thought she may have another UTI.  UA ordered and container given to patient for urine sample.  Patient stated she had hysterectomy in 1980's, her bladder was "cut open".  Stated she has been drinking fluids for the past 3 days.  Last UTI approximately 3 weeks ago and had been taking antibiotic.

## 2016-02-11 NOTE — Progress Notes (Signed)
Adult Psychoeducational Group Note  Date:  02/11/2016 Time:  10:47 AM  Group Topic/Focus:  Goals Group:   The focus of this group is to help patients establish daily goals to achieve during treatment and discuss how the patient can incorporate goal setting into their daily lives to aide in recovery.  Participation Level:  Active  Participation Quality:  Appropriate  Affect:  Appropriate  Cognitive:  Appropriate  Insight: Appropriate  Engagement in Group:  Engaged  Modes of Intervention:  Discussion  Additional Comments:  Pt stated she was feeling food.  Pt stated life and leisure changes she wants to make when she leaves the hospital is get out the house more, and find friends to go out with to go shopping, and out to eat.  Tonia Brooms D 02/11/2016, 10:47 AM

## 2016-02-11 NOTE — [facility] Group Notes (Signed)
[facility] Group Therapy 02/11/2016 1:15pm  Type of Therapy: [facility] [employer] Presentation  Participation Level: Active  Participation Quality: Attentive  Affect: Appropriate  Cognitive: Oriented  Insight: Developing/Improving  Engagement in Therapy: Engaged  Modes of Intervention: Discussion, Education and Socialization  Summary of Progress/Problems: [facility] [employer] (Eddy) Speaker came to talk about his personal journey with substance abuse and addiction. The pt processed ways by which to relate to the speaker. [place] speaker provided handouts and educational information pertaining to groups and services offered by the [facility]. Pt was engaged in speaker's presentation and was receptive to resources provided.    Peri Maris, [place] 02/11/2016 2:13 PM

## 2016-02-11 NOTE — Progress Notes (Signed)
Pt attended karaoke group this evening.  

## 2016-02-11 NOTE — Progress Notes (Signed)
Pt stated she felt a little better that her husband got in touch with her son and she plans to talk with him later today. Pt was relieved that her son knows he was here.

## 2016-02-11 NOTE — Progress Notes (Addendum)
Patient ID: Carol [name], female   DOB: [birth year], 60 y.o.   MRN: [id number] [facility] MD Progress Note  02/11/2016 5:23 PM DESHAWN SKELLEY  MRN:  [id number]  Subjective:  Patient reports she is feeling better, less depressed, less "upset ". She attributes this in part to improving family issues. She states her husband spoke with her adult son ( with whom patient has had issues recently ) and that son seemed to understand and was empathic with patient's feelings, and may be coming to visit her this evening . She denies medication side effects.   Objective:  I have discussed case with treatment team and have met with patient . Patient presenting with improved mood and improving range of affect. At this time denies any suicidal ideations Continues to focus on family stressors, as above, but states relationship with son seems to be improving, which is also helping her to feel better . No  disruptive or agitated behaviors on unit, going to some groups, pleasant and cooperative on approach  Tolerating Effexor XR trial well thus far.  Of note, patient states she has frequent UTIs, usually treated with Cipro empirically. States Cipro helps, but also she notices she gets a " yeast infection after I take it".  At this time is describing mild dysuria, suprapubic discomfort, no fever, no chills . We discussed options, interested in Macrodantin trial. Principal Problem: Major depressive disorder, recurrent episode, severe ([place])  Diagnosis:   Patient Active Problem List   Diagnosis Date Noted  . Major depressive disorder, recurrent episode, severe ([place]) [F33.2] 02/10/2016  . Primary osteoarthritis of right knee [M17.11] 03/27/2015  . Right knee DJD [M17.9] 03/27/2015  . DEPRESSION [F32.9] 06/26/2007  . EXTERNAL HEMORRHOIDS [K64.4] 06/26/2007  . DIVERTICULOSIS, MILD [K57.30] 06/26/2007  . ALLERGY [T78.40XA] 06/26/2007   Total Time spent with patient: 20 minutes   Past Psychiatric History: Major  depression  Past Medical History:  Past Medical History  Diagnosis Date  . Thyroid disease   . Complication of anesthesia     pt states has difficulty with being put to sleep   . PONV (postoperative nausea and vomiting)   . Family history of adverse reaction to anesthesia     pts daughter has N&V  . Polycythemia   . History of bronchitis   . Pneumonia     hx of   . Hypothyroidism   . Anxiety   . History of kidney stones   . History of frequent urinary tract infections   . Arthritis   . History of blood transfusion     2006  . Tinnitus   . Falls   . History of chicken pox   . History of measles as a child   . History of mumps as a child   . Hemorrhoids   . History of jaundice   . History of gallstones   . History of blood clots   . Insomnia   . Night sweats   . Fatigue   . Balance problems     Past Surgical History  Procedure Laterality Date  . Back surgery    . Cholecystectomy    . Abdominal hysterectomy    . Knee surgery    . Tonsillectomy    . Left foot surgery       to repair break - 2002  . Menicus tear      bilat;   . Total knee arthroplasty Right 03/27/2015    Procedure: RIGHT TOTAL KNEE ARTHROPLASTY;  Surgeon:  Susa Day, MD;  Location: [facility];  Service: Orthopedics;  Laterality: Right;  . Knee arthroscopy Right 01/07/2016    Procedure: ARTHROSCOPY RIGHT KNEE, DEBRIDEMENT OF ARTHROFIBROSIS, AND EXAM UNDER ANESTHESIA;  Surgeon: Susa Day, MD;  Location: [facility];  Service: Orthopedics;  Laterality: Right;   Family History:  Family History  Problem Relation Age of Onset  . Hypertension Mother   . Heart attack Father 40   Family Psychiatric  History: See H&P  Social History:  History  Alcohol Use No     History  Drug Use No    Social History   Social History  . Marital Status: Married    Spouse Name: N/A  . Number of Children: N/A  . Years of Education: N/A   Social History Main Topics  . Smoking status: Never Smoker   . Smokeless  tobacco: Never Used  . Alcohol Use: No  . Drug Use: No  . Sexual Activity: Yes    Birth Control/ Protection: None   Other Topics Concern  . None   Social History Narrative   Additional Social History:   Sleep: improving   Appetite:good   Current Medications: Current Facility-Administered Medications  Medication Dose Route Frequency Provider Last Rate Last Dose  . alum & mag hydroxide-simeth (MAALOX/MYLANTA) 200-200-20 MG/5ML suspension 30 mL  30 mL Oral Q4H PRN Laverle Hobby, PA-C      . cyclobenzaprine (FLEXERIL) tablet 10 mg  10 mg Oral Daily PRN Laverle Hobby, PA-C   10 mg at 02/10/16 0807  . docusate sodium (COLACE) capsule 100 mg  100 mg Oral BID PRN Laverle Hobby, PA-C      . gabapentin (NEURONTIN) capsule 300 mg  300 mg Oral TID Laverle Hobby, PA-C   300 mg at 02/11/16 1720  . hydrOXYzine (ATARAX/VISTARIL) tablet 25 mg  25 mg Oral Q6H PRN Jenne Campus, MD   25 mg at 02/10/16 2230  . levothyroxine (SYNTHROID, LEVOTHROID) tablet 112 mcg  112 mcg Oral QAC breakfast Laverle Hobby, PA-C   112 mcg at 02/11/16 0347  . loperamide (IMODIUM) capsule 2-4 mg  2-4 mg Oral PRN Jenne Campus, MD      . LORazepam (ATIVAN) tablet 1 mg  1 mg Oral Q6H PRN Jenne Campus, MD   1 mg at 02/10/16 2230  . magnesium hydroxide (MILK OF MAGNESIA) suspension 30 mL  30 mL Oral Daily PRN Laverle Hobby, PA-C      . naproxen (NAPROSYN) tablet 500 mg  500 mg Oral BID PRN Jenne Campus, MD      . nitrofurantoin (MACRODANTIN) capsule 50 mg  50 mg Oral Q6H Myer Peer Cobos, MD   50 mg at 02/11/16 1720  . ondansetron (ZOFRAN-ODT) disintegrating tablet 4 mg  4 mg Oral Q6H PRN Jenne Campus, MD      . promethazine (PHENERGAN) tablet 25 mg  25 mg Oral PRN Laverle Hobby, PA-C      . traZODone (DESYREL) tablet 50 mg  50 mg Oral QHS Jenne Campus, MD      . Derrill Memo ON 02/12/2016] venlafaxine XR (EFFEXOR-XR) 24 hr capsule 75 mg  75 mg Oral Q breakfast Myer Peer Cobos, MD      . vitamin  B-12 (CYANOCOBALAMIN) tablet 1,000 mcg  1,000 mcg Oral Daily Laverle Hobby, PA-C   1,000 mcg at 02/11/16 4259   Lab Results:  No results found for this or any previous visit (from the  past 48 hour(s)).  Blood Alcohol level:  Lab Results  Component Value Date   ETH 131* 02/12/2011   [facility]  12/13/2009    <5        LOWEST DETECTABLE LIMIT FOR SERUM ALCOHOL IS 5 mg/dL FOR MEDICAL PURPOSES ONLY    Metabolic Disorder Labs: Lab Results  Component Value Date   HGBA1C 5.6 05/06/2011   MPG 114 05/06/2011   MPG 119 06/11/2007   No results found for: PROLACTIN Lab Results  Component Value Date   CHOL 169 05/06/2011   TRIG 183* 05/06/2011   HDL 44 05/06/2011   CHOLHDL 3.8 05/06/2011   VLDL 37 05/06/2011   LDLCALC 88 05/06/2011   LDLCALC * 06/12/2007    100        Total Cholesterol/HDL:CHD Risk Coronary Heart Disease Risk Table                     Men   Women  1/2 Average Risk   3.4   3.3   Physical Findings: AIMS: Facial and Oral Movements Muscles of Facial Expression: None, normal Lips and Perioral Area: None, normal Jaw: None, normal Tongue: None, normal,Extremity Movements Upper (arms, wrists, hands, fingers): None, normal Lower (legs, knees, ankles, toes): None, normal, Trunk Movements Neck, shoulders, hips: None, normal, Overall Severity Severity of abnormal movements (highest score from questions above): None, normal Incapacitation due to abnormal movements: None, normal Patient's awareness of abnormal movements (rate only patient's report): No Awareness, Dental Status Current problems with teeth and/or dentures?: No Does patient usually wear dentures?: No  CIWA:  CIWA-Ar Total: 1 COWS:  COWS Total Score: 2  Musculoskeletal: Strength & Muscle Tone: within normal limits Gait & Station: normal Patient leans: N/A  Psychiatric Specialty Exam: Physical Exam  ROS denies headache, no chest pain, no shortness of breath, no vomiting  no rash , mild dysuria, no fever, no  chills  Blood pressure 108/72, pulse 81, temperature 97.6 F (36.4 C), temperature source Oral, resp. rate 16, height 5' 5.5" (1.664 m), weight 234 lb (106.142 kg).Body mass index is 38.33 kg/(m^2).  General Appearance: improved grooming   Eye Contact: Good  Speech: Normal Rate  Volume: Normal  Mood: improving   Affect: less constricted   Thought Process: Linear  Orientation: Full (Time, Place, and Person)  Thought Content: denies hallucinations, no delusions, not internally preoccupied   Suicidal Thoughts: No- at this time denies any suicidal ideations, contracts for safety on the unit   Homicidal Thoughts: No- denies any homicidal ideations   Memory: recent and remote grossly intact   Judgement: improving   Insight: improving   Psychomotor Activity: improving   Concentration: Concentration: Good and Attention Span: Good  Recall: Good  Fund of Knowledge: Good  Language: Good  Akathisia: Negative  Handed: Right  AIMS (if indicated):    Assets: Communication Skills Desire for Improvement Housing Resilience  ADL's: Intact  Cognition: WNL  Sleep: Number of Hours: 6.5         Assessment - patient presents with improving mood and range of affect, at least partly related to improved family situation and decreasing tension with adult son. She is tolerating Effexor XR well , denies side effects. At this time denies suicidal ideations, and is future oriented. Behavior on unit in good control. Treatment Plan Summary: Continue to encourage group and milieu participation to work on coping skills and symptom reduction  Increase Effexor XR to 75  Mg QDAY  for depression- side effects  have been discussed   Continue Lorazepam 1.0 mg for severe anxiety. Continue Hydroxyzine 25 mg Q 6 H  PRN  for anxiety. Continue  Gabapentin 300 mg TID for pain , anxiety  Continue  Trazodone 50 mgQHS PRN  for insomnia. Start Macrodantin 50 mgrs Q 6 hours for  UTI  Treatment team working on disposition planning options   Neita Garnet, MD  02/11/2016, 5:23 PM
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
## 2016-02-11 NOTE — Progress Notes (Signed)
Adult Psychoeducational Group Note  Date:  02/11/2016 Time: 09:00am  Group Topic/Focus:  Managing Feelings:   The focus of this group is to identify what feelings patients have difficulty handling and develop a plan to handle them in a healthier way upon discharge.  Participation Level:  Active  Participation Quality:  Appropriate  Affect:  Appropriate  Cognitive:  Alert and Oriented  Insight: Improving  Engagement in Group:  Engaged  Modes of Intervention:  Activity, Discussion, Education, Role-play and Support  Additional Comments:  Pt reports feeling anxiety in her chest and identifies listening to music as a healthy coping skill to manage her feelings.   Elenore Rota 02/11/2016, 10:04 AM

## 2016-02-11 NOTE — Plan of Care (Signed)
Problem: Education: Goal: Ability to make informed decisions regarding treatment will improve Outcome: Progressing  Nurse discussed suicide thoughts, depression/coping skills with patient.

## 2016-02-12 DIAGNOSIS — F332 Major depressive disorder, recurrent severe without psychotic features: Secondary | ICD-10-CM | POA: Diagnosis not present

## 2016-02-12 MED ORDER — HYDROXYZINE HCL 25 MG PO TABS
25.0000 mg | ORAL_TABLET | Freq: Four times a day (QID) | ORAL | Status: DC | PRN
  Filled 2016-02-12: qty 10

## 2016-02-12 MED ORDER — NAPROXEN 500 MG PO TABS: 500.0000 mg | ORAL_TABLET | Freq: Two times a day (BID) | ORAL | Status: DC | PRN

## 2016-02-12 MED ORDER — CYCLOBENZAPRINE HCL 10 MG PO TABS: 10.0000 mg | ORAL_TABLET | Freq: Every day | ORAL | Status: DC | PRN

## 2016-02-12 MED ORDER — HYDROXYZINE HCL 25 MG PO TABS: ORAL_TABLET | ORAL | Status: DC

## 2016-02-12 MED ORDER — PROMETHAZINE HCL 25 MG PO TABS: 25.0000 mg | ORAL_TABLET | Freq: Four times a day (QID) | ORAL | Status: DC | PRN

## 2016-02-12 MED ORDER — VENLAFAXINE HCL ER 75 MG PO CP24: 75.0000 mg | ORAL_CAPSULE | Freq: Every day | ORAL | Status: DC

## 2016-02-12 MED ORDER — TRAZODONE HCL 50 MG PO TABS: 50.0000 mg | ORAL_TABLET | Freq: Every day | ORAL | Status: DC

## 2016-02-12 MED ORDER — NITROFURANTOIN MACROCRYSTAL 50 MG PO CAPS: 50.0000 mg | ORAL_CAPSULE | Freq: Four times a day (QID) | ORAL | Status: DC

## 2016-02-12 MED ORDER — LEVOTHYROXINE SODIUM 112 MCG PO TABS: 112.0000 ug | ORAL_TABLET | Freq: Every day | ORAL | Status: DC

## 2016-02-12 MED ORDER — GABAPENTIN 300 MG PO CAPS: 300.0000 mg | ORAL_CAPSULE | Freq: Three times a day (TID) | ORAL | Status: DC

## 2016-02-12 MED ORDER — VITAMIN B-12 500 MCG PO TABS: 1000.0000 ug | ORAL_TABLET | Freq: Every day | ORAL | Status: DC

## 2016-02-12 MED ORDER — DOCUSATE SODIUM 100 MG PO CAPS: 100.0000 mg | ORAL_CAPSULE | Freq: Two times a day (BID) | ORAL | Status: DC | PRN

## 2016-02-12 NOTE — BHH Suicide Risk Assessment (Signed)
[facility] Discharge Suicide Risk Assessment   Principal Problem: Major depressive disorder, recurrent episode, severe ([place]) Discharge Diagnoses:  Patient Active Problem List   Diagnosis Date Noted  . Major depressive disorder, recurrent episode, severe ([place]) [F33.2] 02/10/2016  . Primary osteoarthritis of right knee [M17.11] 03/27/2015  . Right knee DJD [M17.9] 03/27/2015  . DEPRESSION [F32.9] 06/26/2007  . EXTERNAL HEMORRHOIDS [K64.4] 06/26/2007  . DIVERTICULOSIS, MILD [K57.30] 06/26/2007  . ALLERGY [T78.40XA] 06/26/2007    Total Time spent with patient: 30 minutes  Musculoskeletal: Strength & Muscle Tone: within normal limits Gait & Station: normal Patient leans: N/A  Psychiatric Specialty Exam: ROS denies headache, no chest pain, no shortness of breath, no rash, no fever, no chills, chronic back pain  Blood pressure 107/59, pulse 82, temperature 97.6 F (36.4 C), temperature source Oral, resp. rate 16, height 5' 5.5" (1.664 m), weight 234 lb (106.142 kg).Body mass index is 38.33 kg/(m^2).  General Appearance: Well Groomed  Eye Contact::  Good  Speech:  Normal Rate409  Volume:  Normal  Mood:  improved mood, currently euthymic  Affect:  Appropriate  Thought Process:  Linear  Orientation:  Full (Time, Place, and Person)  Thought Content:  denies hallucinations, no delusions expressed   Suicidal Thoughts:  No- denies any suicidal or self injurious ideations  Homicidal Thoughts:  No- denies any violent ideations   Memory:  recent and remote grossly intact   Judgement:  Other:  improved  Insight:  improved   Psychomotor Activity:  Normal  Concentration:  Good  Recall:  Good  Fund of Knowledge:Good  Language: Good  Akathisia:  Negative  Handed:  Right  AIMS (if indicated):     Assets:  Communication Skills Desire for Improvement Resilience Social Support  Sleep:  Number of Hours: 5.75  Cognition: WNL  ADL's:  Intact   Mental Status Per Nursing Assessment::   On  Admission:     Demographic Factors:  60 year old married female   Loss Factors: Family stressors, mainly related to adult son , concerns that he will keep her from seeing her grandson, financial concerns  Historical Factors: History of depression, history of one prior psychiatric admission several years ago for opiate detoxification   Risk Reduction Factors:   Sense of responsibility to family, Living with another person, especially a relative and Positive social support  Continued Clinical Symptoms:  At this time patient is improved compared to admission- presents alert, attentive, well related, pleasant, mood improved and currently minimizes depression at this time, affect full in range, brighter, no thought disorder, no SI or HI, no psychotic symptoms, future oriented . Denies medication side effects  Cognitive Features That Contribute To Risk:  No gross cognitive deficits noted upon discharge. Is alert , attentive, and oriented x 3   Suicide Risk:  Mild:  Suicidal ideation of limited frequency, intensity, duration, and specificity.  There are no identifiable plans, no associated intent, mild dysphoria and related symptoms, good self-control (both objective and subjective assessment), few other risk factors, and identifiable protective factors, including available and accessible social support.  Follow-up Information    Follow up with [facility].   Why:  7/25 at 9:00am for therapy with Elmyra Ricks. Please arrive 20 minutes early to complete paperwork. 8/2 at 2:00pm for medication management.   Contact information:   [id number] [place], [place] [address] Phone: [phone]      Plan Of Care/Follow-up recommendations:  Activity:  as tolerated  Diet:  Regular  Tests:  NA Other:  See below   Patient is leaving unit in good spirits  Plans to return home Plans to follow up as above Plans to follow up with Dr. Sharlett Iles at [facility] for ongoing medical issues as needed   Neita Garnet, MD 02/12/2016, 11:22 AM

## 2016-02-12 NOTE — Tx Team (Signed)
Interdisciplinary Treatment Plan Update (Adult) Date: 02/12/2016    Time Reviewed: 9:30 AM  Progress in Treatment: Attending groups: Yes Participating in groups: Yes Taking medication as prescribed: Yes Tolerating medication: Yes Family/Significant other contact made: Yes, with husband Patient understands diagnosis: Yes Discussing patient identified problems/goals with staff: Yes Medical problems stabilized or resolved: Yes Denies suicidal/homicidal ideation: Yes Issues/concerns per patient self-inventory: Yes Other:  New problem(s) identified: N/A  Discharge Plan or Barriers: Pt will return home and follow-up with outpatient services.   Reason for Continuation of Hospitalization:  Depression Anxiety Medication Stabilization   Comments: N/A  Estimated length of stay: 0 days    Patient is a 60 year old female with a history of depression and anxiety who presented to the hospital with SI, increased depression, and panic attacks. Pt reports primary trigger(s) for admission was family and marital stressors as well as chronic pain issues. Patient will benefit from crisis stabilization, medication evaluation, group therapy and psycho education in addition to case management for discharge planning. At discharge, it is recommended that Pt remain compliant with established discharge plan and continued treatment.   Review of initial/current patient goals per problem list:  1. Goal(s): Patient will participate in aftercare plan   Met: Yes   Target date: 3-5 days post admission date   As evidenced by: Patient will participate within aftercare plan AEB aftercare provider and housing plan at discharge being identified.   7/18: Goal met. Patient plans to return home to follow up with outpatient services.    2. Goal (s): Patient will exhibit decreased depressive symptoms and suicidal ideations.   Met: Yes   Target date: 3-5 days post admission date   As evidenced by:  Patient will utilize self rating of depression at 3 or below and demonstrate decreased signs of depression or be deemed stable for discharge by MD.  7/18: Goal not met: Pt presents with flat affect and depressed mood.  Pt admitted with depression rating of 10.  Pt to show decreased sign of depression and a rating of 3 or less before d/c.    7/21: Pt rates depression at 0/10; denies SI ad reports much improved mood     3. Goal(s): Patient will demonstrate decreased signs and symptoms of anxiety.   Met: Yes   Target date: 3-5 days post admission date   As evidenced by: Patient will utilize self rating of anxiety at 3 or below and demonstrated decreased signs of anxiety, or be deemed stable for discharge by MD   7/18: Goal not met: Pt presents with anxious mood and affect.  Pt admitted with anxiety rating of 10.  Pt to show decreased sign of anxiety and a rating of 3 or less before d/c.  7/21: Pt rates anxiety at 0/10; presents with improved affect     Attendees: Patient:    Family:    Physician: Dr. Cobos 02/12/2016 9:30 AM  Nursing: Barbara Thornblom, RN; Patricia Duke, RN  02/12/2016 9:30 AM  Clinical Social Worker: Lauren Carter, LCSW  02/12/2016 9:30 AM  Other: Lauren Carter, LCSWA; Heather Smart, LCSW  02/12/2016 9:30 AM  Other:  02/12/2016 9:30 AM  Other: Jennifer Clark, Case Manager 02/12/2016 9:30 AM  Other: May Augustin, Aggie Nwoko, NP 02/12/2016 9:30 AM  Other:    Other:    Other:    Other:     Scribe for Treatment Team:  Lauren Carter, LCSWA Clinical Social Work [phone]        

## 2016-02-12 NOTE — Progress Notes (Signed)
Patient to discharged ambulatory to lobby. All discharge paperwork given and signed, belongings returned. Prescriptions given. Patient able to verbalize understanding. Patient stable, denies SI/HI/AVH. Patient given opportunity to express concerns and ask questions. Daughter here to transport

## 2016-02-12 NOTE — Progress Notes (Signed)
  [facility] Adult Case Management Discharge Plan :  Will you be returning to the same living situation after discharge:  Yes,  Pt returning home  At discharge, do you have transportation home?: Yes,  Pt daughter to pick up Do you have the ability to pay for your medications: Yes,  Pt provided with prescriptions  Release of information consent forms completed and in the chart;  Patient's signature needed at discharge.  Patient to Follow up at: Follow-up Information    Follow up with [facility].   Why:  7/25 at 9:00am for therapy with Elmyra Ricks. Please arrive 20 minutes early to complete paperwork. 8/2 at 2:00pm for medication management.   Contact information:   [place], [place] [address] Phone: [phone]      Next level of care provider has access to [place] and Suicide Prevention discussed: Yes,  with husband; see SPE note  Have you used any form of tobacco in the last 30 days? (Cigarettes, Smokeless Tobacco, Cigars, and/or Pipes): No  Has patient been referred to the Quitline?: N/A patient is not a smoker  Patient has been referred for addiction treatment: N/A  Bo Mcclintock 02/12/2016, 10:47 AM

## 2016-02-12 NOTE — Progress Notes (Signed)
D: Patient's self inventory sheet: patient has good sleep, requested and recieved sleep medication.good  Appetite, normal energy level, good concentration. Rated depression 0/10, hopeless 0/10, anxiety 0/10. SI/HI/AVH: Denies. Physical complaints are back and knee pain responding well to Neurontin. Goal is "going home, talking with family about what I learned about me and how I will change it". Plans to work on "go to groups, read back through notes and apply what I learned, also need to see social worker about outside therapy".Pt pleasant and interactive, plans to discharge home today when daughter can pick her up. A: Medications administered, assessed medication knowledge and education given on medication regimen.  Emotional support and encouragement given patient. R: Denies SI and HI , contracts for safety. Safety maintained with 15 minute checks.

## 2016-02-12 NOTE — Discharge Summary (Signed)
Physician Discharge Summary Note  Patient:  Carol [name] is an 60 y.o., female MRN:  [id number] DOB:  1955/09/23 Patient phone:  [phone] (home)  Patient address:   [address] [place] [address],  Total Time spent with patient: 30 minutes  Date of Admission:  02/09/2016 Date of Discharge: 02-12-16  Reason for Admission: Worsening symptoms of depression  Principal Problem: Major depressive disorder, recurrent episode, severe [facility]) Discharge Diagnoses: Patient Active Problem List   Diagnosis Date Noted  . Major depressive disorder, recurrent episode, severe ([place]) [F33.2] 02/10/2016    Priority: High  . Primary osteoarthritis of right knee [M17.11] 03/27/2015  . Right knee DJD [M17.9] 03/27/2015  . DEPRESSION [F32.9] 06/26/2007  . EXTERNAL HEMORRHOIDS [K64.4] 06/26/2007  . DIVERTICULOSIS, MILD [K57.30] 06/26/2007  . ALLERGY [T78.40XA] 06/26/2007   Past Psychiatric History: Major depressive disorder, recurrent  Past Medical History:  Past Medical History  Diagnosis Date  . Thyroid disease   . Complication of anesthesia     pt states has difficulty with being put to sleep   . PONV (postoperative nausea and vomiting)   . Family history of adverse reaction to anesthesia     pts daughter has N&V  . Polycythemia   . History of bronchitis   . Pneumonia     hx of   . Hypothyroidism   . Anxiety   . History of kidney stones   . History of frequent urinary tract infections   . Arthritis   . History of blood transfusion     2006  . Tinnitus   . Falls   . History of chicken pox   . History of measles as a child   . History of mumps as a child   . Hemorrhoids   . History of jaundice   . History of gallstones   . History of blood clots   . Insomnia   . Night sweats   . Fatigue   . Balance problems     Past Surgical History  Procedure Laterality Date  . Back surgery    . Cholecystectomy    . Abdominal hysterectomy    . Knee surgery    .  Tonsillectomy    . Left foot surgery       to repair break - 2002  . Menicus tear      bilat;   . Total knee arthroplasty Right 03/27/2015    Procedure: RIGHT TOTAL KNEE ARTHROPLASTY;  Surgeon: Susa Day, MD;  Location: [facility] ORS;  Service: Orthopedics;  Laterality: Right;  . Knee arthroscopy Right 01/07/2016    Procedure: ARTHROSCOPY RIGHT KNEE, DEBRIDEMENT OF ARTHROFIBROSIS, AND EXAM UNDER ANESTHESIA;  Surgeon: Susa Day, MD;  Location: [facility];  Service: Orthopedics;  Laterality: Right;   Family History:  Family History  Problem Relation Age of Onset  . Hypertension Mother   . Heart attack Father 76   Family Psychiatric  History: See H&P  Social History:  History  Alcohol Use No     History  Drug Use No    Social History   Social History  . Marital Status: Married    Spouse Name: N/A  . Number of Children: N/A  . Years of Education: N/A   Social History Main Topics  . Smoking status: Never Smoker   . Smokeless tobacco: Never Used  . Alcohol Use: No  . Drug Use: No  . Sexual Activity: Yes    Birth Control/ Protection: None   Other Topics Concern  .  None   Social History Narrative   Hospital Course: 60 year old married female. Presented to ED reporting increased anxiety and depression. States she has chronic depression, and frequent " anxiety attacks". States " I think I have been depressed since my mother died several years ago". States she has been feeling vaguely irritable and has noticed she is short and abrupt with people, which she states " is because I am depressed and I do not feel well ". Describes family stressors as contributing to her depression- states she had a recent argument with her adult son.States she feels he does not allow him to see her grandson as often as she would want to. Also, reports that a few weeks ago discovered that her husband had been texting another woman. Describes neuro-vegetative symptoms, and states she had recently been having  passive SI, no plan , no clear intent, " feeling like nobody cares and I should just die". Of note, patient states she has taken antidepressants in the past, but stopped about one year ago.  Carol [name] was admitted to the [facility] adult unit with complaints of worsening symptoms of depression which she attributed to an argument with her adult son whereby he threatened that he will no longer allow Carol [name] to see him or her grandson. Carol [name] stated that thought of having to not see her son or grandson will just be unbearable for her. That led to her feeling like no one cared about her. She was in need of mood stabilization treatments. During the course of her treatment, Carol [name] was medicated & discharged on, Effexor XR 75 mg for depression, Gabapentin 300 mg for agitation & Trazodone 50 mg insomnia. She was enrolled & participated in the group counseling sessions being offered & held on this unit. She was counseled & learned coping skills that should help her cope better & maintain mood stability after discharge. She was resumed on all her pertinent home medications for the other previously existing medical issues that she presented. She tolerated her treatment regimen without any adverse effects reported.   While her treatment was on going, Carol [name]'s improvement was monitored by observation & her daily reports of symptom reduction noted.  Her emotional & mental status were monitored by daily self-inventory reports completed by her & the clinical staff. Carol [name] was evaluated daily by the treatment team for mood stability & the need for continued recovery after discharge. Her motivation was an integral factor in her recovery & mood stability. She was offered further treatment options upon discharge & will follow up with the outpatient psychiatric services as listed below.     Upon discharge, Carol [name] was both mentally & medically stable for discharge. She is currently denying suicidal, homicidal ideation, auditory, visual/tactile hallucinations,  delusional thoughts & or paranoia. She was provided with a 7 days worth, supply samples of her [facility] discharge medications. She left [facility] with all personal belongings in no apparent distress. Transportation per daughter.       Physical Findings: AIMS: Facial and Oral Movements Muscles of Facial Expression: None, normal Lips and Perioral Area: None, normal Jaw: None, normal Tongue: None, normal,Extremity Movements Upper (arms, wrists, hands, fingers): None, normal Lower (legs, knees, ankles, toes): None, normal, Trunk Movements Neck, shoulders, hips: None, normal, Overall Severity Severity of abnormal movements (highest score from questions above): None, normal Incapacitation due to abnormal movements: None, normal Patient's awareness of abnormal movements (rate only patient's report): No Awareness, Dental Status Current problems with teeth and/or dentures?: No Does patient usually  wear dentures?: No  CIWA:  CIWA-Ar Total: 0 COWS:  COWS Total Score: 2  Musculoskeletal: Strength & Muscle Tone: within normal limits Gait & Station: normal Patient leans: N/A  Psychiatric Specialty Exam: Physical Exam  Constitutional: She appears well-developed.  HENT:  Head: Normocephalic.  Eyes: Pupils are equal, round, and reactive to light.  Neck: Normal range of motion.  Cardiovascular: Normal rate.   Respiratory: Effort normal.  GI: Soft.  Genitourinary:  Denies any issues in this area  Musculoskeletal: Normal range of motion.  Neurological: She is alert.  Skin: Skin is warm.    Review of Systems  Constitutional: Negative.   HENT: Negative.   Eyes: Negative.   Respiratory: Negative.   Cardiovascular: Negative.   Gastrointestinal: Negative.   Genitourinary: Negative.   Musculoskeletal: Negative.   Skin: Negative.   Neurological: Negative.   Endo/Heme/Allergies: Negative.   Psychiatric/Behavioral: Positive for depression. Negative for suicidal ideas, hallucinations, memory loss and  substance abuse. The patient has insomnia (Stable). The patient is not nervous/anxious.     Blood pressure 107/59, pulse 82, temperature 97.6 F (36.4 C), temperature source Oral, resp. rate 16, height 5' 5.5" (1.664 m), weight 106.142 kg (234 lb).Body mass index is 38.33 kg/(m^2).  See Md's SRA   Have you used any form of tobacco in the last 30 days? (Cigarettes, Smokeless Tobacco, Cigars, and/or Pipes): No  Has this patient used any form of tobacco in the last 30 days? (Cigarettes, Smokeless Tobacco, Cigars, and/or Pipes):  No  Blood Alcohol level:  Lab Results  Component Value Date   ETH 131* 02/12/2011   ETH  12/13/2009    <5        LOWEST DETECTABLE LIMIT FOR SERUM ALCOHOL IS 5 mg/dL FOR MEDICAL PURPOSES ONLY   Metabolic Disorder Labs:  Lab Results  Component Value Date   HGBA1C 5.6 05/06/2011   MPG 114 05/06/2011   MPG 119 06/11/2007   No results found for: PROLACTIN Lab Results  Component Value Date   CHOL 169 05/06/2011   TRIG 183* 05/06/2011   HDL 44 05/06/2011   CHOLHDL 3.8 05/06/2011   VLDL 37 05/06/2011   LDLCALC 88 05/06/2011   LDLCALC * 06/12/2007    100        Total Cholesterol/HDL:CHD Risk Coronary Heart Disease Risk Table                     Men   Women  1/2 Average Risk   3.4   3.3   See Psychiatric Specialty Exam and Suicide Risk Assessment completed by Attending Physician prior to discharge.  Discharge destination:  Home  Is patient on multiple antipsychotic therapies at discharge:  No   Has Patient had three or more failed trials of antipsychotic monotherapy by history:  No  Recommended Plan for Multiple Antipsychotic Therapies: NA    Medication List    STOP taking these medications        ALEVE 220 MG tablet  Generic drug:  naproxen sodium     ALPRAZolam 0.5 MG tablet  Commonly known as:  XANAX     aspirin EC 325 MG tablet     oxybutynin 5 MG tablet  Commonly known as:  DITROPAN     oxyCODONE 5 MG immediate release tablet   Commonly known as:  Oxy IR/ROXICODONE      TAKE these medications      Indication   cyclobenzaprine 10 MG tablet  Commonly known as:  FLEXERIL  Take 1 tablet (10 mg total) by mouth daily as needed for muscle spasms.   Indication:  Muscle Spasm     docusate sodium 100 MG capsule  Commonly known as:  COLACE  Take 1 capsule (100 mg total) by mouth 2 (two) times daily as needed for mild constipation.   Indication:  Constipation     gabapentin 300 MG capsule  Commonly known as:  NEURONTIN  Take 1 capsule (300 mg total) by mouth 3 (three) times daily. For agitation/pain management   Indication:  Agitation, Pain management     hydrOXYzine 25 MG tablet  Commonly known as:  ATARAX/VISTARIL  Take 1 tablet (25 mg) four times daily as needed: For anxiety   Indication:  Anxiety     levothyroxine 112 MCG tablet  Commonly known as:  SYNTHROID, LEVOTHROID  Take 1 tablet (112 mcg total) by mouth daily before breakfast. For low functioning thyroid   Indication:  Underactive Thyroid     naproxen 500 MG tablet  Commonly known as:  NAPROSYN  Take 1 tablet (500 mg total) by mouth 2 (two) times daily as needed for moderate pain.   Indication:  Mild to Moderate Pain     nitrofurantoin 50 MG capsule  Commonly known as:  MACRODANTIN  Take 1 capsule (50 mg total) by mouth every 6 (six) hours.   Indication:  Urinary Tract Infection     promethazine 25 MG tablet  Commonly known as:  PHENERGAN  Take 1 tablet (25 mg total) by mouth every 6 (six) hours as needed for nausea or vomiting.   Indication:  Nausea, Vomiting     traZODone 50 MG tablet  Commonly known as:  DESYREL  Take 1 tablet (50 mg total) by mouth at bedtime. For sleep   Indication:  Trouble Sleeping     venlafaxine XR 75 MG 24 hr capsule  Commonly known as:  EFFEXOR-XR  Take 1 capsule (75 mg total) by mouth daily with breakfast. For depression   Indication:  Major Depressive Disorder     vitamin B-12 500 MCG tablet  Commonly  known as:  CYANOCOBALAMIN  Take 2 tablets (1,000 mcg total) by mouth daily. For low B-12   Indication:  Vitamin B12 Absorption Study       Follow-up Information    Follow up with [facility].   Why:  7/25 at 9:00am for therapy with Elmyra Ricks. Please arrive 20 minutes early to complete paperwork. 8/2 at 2:00pm for medication management.   Contact information:   [address] [place], [place] [address] Phone: [phone]    Follow-up recommendations: Activity:  As tolerated Diet: As recommended by your primary care doctor. Keep all scheduled follow-up appointments as recommended.   Comments: Patient is instructed prior to discharge to: Take all medications as prescribed by his/her mental healthcare provider. Report any adverse effects and or reactions from the medicines to his/her outpatient provider promptly. Patient has been instructed & cautioned: To not engage in alcohol and or illegal drug use while on prescription medicines. In the event of worsening symptoms, patient is instructed to call the crisis hotline, 911 and or go to the nearest ED for appropriate evaluation and treatment of symptoms. To follow-up with his/her primary care provider for your other medical issues, concerns and or health care needs.   Signed: Encarnacion Slates, NP, PMHNP, FNP-BC 02/12/2016, 10:47 AM  Patient seen, Suicide Assessment Completed.  Disposition Plan Reviewed

## 2016-02-12 NOTE — Progress Notes (Signed)
Patient ID: Carol [name], female   DOB: October 29, 1955, 60 y.o.   MRN: [id number] D: Client has a visit from husband and daughter this shift, reports a nice visit. Client complains that she has had stomach pain and points to pelvic area. "they sent cultures off today" "I'm just getting over a bladder infection" "having problems urinating"  A: Writer provided emotional support, suggested fluids (water) to promote urine flow. Medications reviewed, administered as ordered. R: Client is safe on the unit, attended karaoke.

## 2016-02-16 ENCOUNTER — Ambulatory Visit (INDEPENDENT_AMBULATORY_CARE_PROVIDER_SITE_OTHER): Payer: PPO | Admitting: Licensed Clinical Social Worker

## 2016-02-16 DIAGNOSIS — F332 Major depressive disorder, recurrent severe without psychotic features: Secondary | ICD-10-CM | POA: Diagnosis not present
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
## 2016-02-23 DIAGNOSIS — M1712 Unilateral primary osteoarthritis, left knee: Secondary | ICD-10-CM | POA: Diagnosis not present

## 2016-02-24 ENCOUNTER — Ambulatory Visit (INDEPENDENT_AMBULATORY_CARE_PROVIDER_SITE_OTHER): Payer: Self-pay | Admitting: Psychiatry

## 2016-02-24 DIAGNOSIS — J310 Chronic rhinitis: Secondary | ICD-10-CM | POA: Diagnosis not present

## 2016-02-24 DIAGNOSIS — F332 Major depressive disorder, recurrent severe without psychotic features: Secondary | ICD-10-CM | POA: Diagnosis not present

## 2016-02-24 DIAGNOSIS — R1313 Dysphagia, pharyngeal phase: Secondary | ICD-10-CM | POA: Diagnosis not present

## 2016-02-24 MED ORDER — TRAZODONE HCL 50 MG PO TABS: 50.0000 mg | ORAL_TABLET | Freq: Every day | ORAL | 0 refills | Status: DC

## 2016-02-24 MED ORDER — VENLAFAXINE HCL ER 150 MG PO CP24: 150.0000 mg | ORAL_CAPSULE | Freq: Every day | ORAL | 0 refills | Status: DC
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
## 2016-03-02 DIAGNOSIS — Z87898 Personal history of other specified conditions: Secondary | ICD-10-CM | POA: Diagnosis not present

## 2016-03-02 DIAGNOSIS — Z8619 Personal history of other infectious and parasitic diseases: Secondary | ICD-10-CM | POA: Diagnosis not present

## 2016-03-02 DIAGNOSIS — R1314 Dysphagia, pharyngoesophageal phase: Secondary | ICD-10-CM | POA: Diagnosis not present

## 2016-03-02 DIAGNOSIS — R1312 Dysphagia, oropharyngeal phase: Secondary | ICD-10-CM | POA: Diagnosis not present

## 2016-03-03 ENCOUNTER — Other Ambulatory Visit (HOSPITAL_COMMUNITY): Payer: Self-pay | Admitting: Gastroenterology

## 2016-03-03 DIAGNOSIS — R300 Dysuria: Secondary | ICD-10-CM | POA: Diagnosis not present

## 2016-03-03 DIAGNOSIS — R1310 Dysphagia, unspecified: Secondary | ICD-10-CM

## 2016-03-03 DIAGNOSIS — Z6838 Body mass index (BMI) 38.0-38.9, adult: Secondary | ICD-10-CM | POA: Diagnosis not present

## 2016-03-03 DIAGNOSIS — F418 Other specified anxiety disorders: Secondary | ICD-10-CM | POA: Diagnosis not present

## 2016-03-03 DIAGNOSIS — R112 Nausea with vomiting, unspecified: Secondary | ICD-10-CM | POA: Diagnosis not present

## 2016-03-03 DIAGNOSIS — M545 Low back pain: Secondary | ICD-10-CM | POA: Diagnosis not present

## 2016-03-03 DIAGNOSIS — J302 Other seasonal allergic rhinitis: Secondary | ICD-10-CM | POA: Diagnosis not present

## 2016-03-08 ENCOUNTER — Ambulatory Visit (HOSPITAL_COMMUNITY): Payer: Self-pay | Admitting: Clinical

## 2016-03-09 ENCOUNTER — Ambulatory Visit (HOSPITAL_COMMUNITY): Payer: PPO

## 2016-03-09 DIAGNOSIS — M1811 Unilateral primary osteoarthritis of first carpometacarpal joint, right hand: Secondary | ICD-10-CM | POA: Diagnosis not present

## 2016-03-10 ENCOUNTER — Ambulatory Visit (HOSPITAL_COMMUNITY)
Admission: RE | Admit: 2016-03-10 | Discharge: 2016-03-10 | Disposition: A | Discharge status: Home / Self Care | Payer: PPO | Source: Ambulatory Visit | Attending: Gastroenterology | Admitting: Gastroenterology

## 2016-03-10 DIAGNOSIS — R1310 Dysphagia, unspecified: Secondary | ICD-10-CM | POA: Diagnosis not present

## 2016-03-10 NOTE — Progress Notes (Addendum)
Objective Swallowing Evaluation: Type of Study: MBS-Modified Barium Swallow Study  Patient Details  Name: Carol [name] MRN: [id number] Date of Birth: February 18, 1956  Today's Date: 03/10/2016 Time: SLP Start Time (ACUTE ONLY): 1100-SLP Stop Time (ACUTE ONLY): 1120 SLP Time Calculation (min) (ACUTE ONLY): 20 min  Past Medical History:  Past Medical History:  Diagnosis Date  . Anxiety   . Arthritis   . Balance problems   . Complication of anesthesia    pt states has difficulty with being put to sleep   . Falls   . Family history of adverse reaction to anesthesia    pts daughter has N&V  . Fatigue   . Hemorrhoids   . History of blood clots   . History of blood transfusion    2006  . History of bronchitis   . History of chicken pox   . History of frequent urinary tract infections   . History of gallstones   . History of jaundice   . History of kidney stones   . History of measles as a child   . History of mumps as a child   . Hypothyroidism   . Insomnia   . Night sweats   . Pneumonia    hx of   . Polycythemia   . PONV (postoperative nausea and vomiting)   . Thyroid disease   . Tinnitus    Past Surgical History:  Past Surgical History:  Procedure Laterality Date  . ABDOMINAL HYSTERECTOMY    . BACK SURGERY    . CHOLECYSTECTOMY    . KNEE ARTHROSCOPY Right 01/07/2016   Procedure: ARTHROSCOPY RIGHT KNEE, DEBRIDEMENT OF ARTHROFIBROSIS, AND EXAM UNDER ANESTHESIA;  Surgeon: Susa Day, MD;  Location: [facility];  Service: Orthopedics;  Laterality: Right;  . KNEE SURGERY    . left foot surgery      to repair break - 2002  . menicus tear     bilat;   . TONSILLECTOMY    . TOTAL KNEE ARTHROPLASTY Right 03/27/2015   Procedure: RIGHT TOTAL KNEE ARTHROPLASTY;  Surgeon: Susa Day, MD;  Location: [facility];  Service: Orthopedics;  Laterality: Right;   HPI: 60 year old female seen for OP MBS due to c/o globus during po intake.   No Data Recorded  Assessment / Plan /  Recommendation  CHL IP CLINICAL IMPRESSIONS 03/10/2016  Therapy Diagnosis Suspected primary esophageal dysphagia  Clinical Impression Patient presents with a suspected primary esophageal dysphagia given c/o globus with po intake despite full pharyngeal and upper esophageal clearance and full airway protection. No aspiration or penetration noted. No f/u SLP needs indicated. Defer f/u for possible GER-like symptoms to MD.   Impact on safety and function --      CHL IP TREATMENT RECOMMENDATION 03/10/2016  Treatment Recommendations No treatment recommended at this time     No flowsheet data found.  CHL IP DIET RECOMMENDATION 03/10/2016  SLP Diet Recommendations Regular solids;Thin liquid  Liquid Administration via Cup;Straw  Medication Administration Whole meds with liquid  Compensations Slow rate;Small sips/bites;Follow solids with liquid  Postural Changes Seated upright at 90 degrees;Remain semi-upright after after feeds/meals (Comment)      CHL IP OTHER RECOMMENDATIONS 03/10/2016  Recommended Consults --  Oral Care Recommendations Oral care BID  Other Recommendations --      CHL IP FOLLOW UP RECOMMENDATIONS 03/10/2016  Follow up Recommendations None      No flowsheet data found.         CHL IP ORAL PHASE 03/10/2016  Oral Phase WFL  Oral - Pudding Teaspoon --  Oral - Pudding Cup --  Oral - Honey Teaspoon --  Oral - Honey Cup --  Oral - Nectar Teaspoon --  Oral - Nectar Cup --  Oral - Nectar Straw --  Oral - Thin Teaspoon --  Oral - Thin Cup --  Oral - Thin Straw --  Oral - Puree --  Oral - Mech Soft --  Oral - Regular --  Oral - Multi-Consistency --  Oral - Pill --  Oral Phase - Comment --    CHL IP PHARYNGEAL PHASE 03/10/2016  Pharyngeal Phase WFL  Pharyngeal- Pudding Teaspoon --  Pharyngeal --  Pharyngeal- Pudding Cup --  Pharyngeal --  Pharyngeal- Honey Teaspoon --  Pharyngeal --  Pharyngeal- Honey Cup --  Pharyngeal --  Pharyngeal- Nectar Teaspoon --   Pharyngeal --  Pharyngeal- Nectar Cup --  Pharyngeal --  Pharyngeal- Nectar Straw --  Pharyngeal --  Pharyngeal- Thin Teaspoon --  Pharyngeal --  Pharyngeal- Thin Cup --  Pharyngeal --  Pharyngeal- Thin Straw --  Pharyngeal --  Pharyngeal- Puree --  Pharyngeal --  Pharyngeal- Mechanical Soft --  Pharyngeal --  Pharyngeal- Regular --  Pharyngeal --  Pharyngeal- Multi-consistency --  Pharyngeal --  Pharyngeal- Pill --  Pharyngeal --  Pharyngeal Comment --     CHL IP CERVICAL ESOPHAGEAL PHASE 03/10/2016  Cervical Esophageal Phase WFL  Pudding Teaspoon --  Pudding Cup --  Honey Teaspoon --  Honey Cup --  Nectar Teaspoon --  Nectar Cup --  Nectar Straw --  Thin Teaspoon --  Thin Cup --  Thin Straw --  Puree --  Mechanical Soft --  Regular --  Multi-consistency --  Pill --  Cervical Esophageal Comment --    CHL IP GO 03/10/2016  Functional Assessment Tool Used skilled clinical judgement   Functional Limitations Swallowing  Swallow Current Status [id number]) CI  Swallow Goal Status [id number]) CI  Swallow Discharge Status [id number]) CI  Motor Speech Current Status [id number]) (None)  Motor Speech Goal Status UK:060616) (None)  Motor Speech Goal Status SA:931536) (None)  Spoken Language Comprehension Current Status [id number]) (None)  Spoken Language Comprehension Goal Status [id number]) (None)  Spoken Language Comprehension Discharge Status [id number]) (None)  Spoken Language Expression Current Status FP:[id number]) (None)  Spoken Language Expression Goal Status [id number]:9098795) (None)  Spoken Language Expression Discharge Status [id number]:1565649) (None)  Attention Current Status [id number]) (None)  Attention Goal Status EY:7266000) (None)  Attention Discharge Status [id number]) (None)  Memory Current Status YL:3545582) (None)  Memory Goal Status CF:3682075) (None)  Memory Discharge Status QC:115444) (None)  Voice Current Status [id number]) (None)  Voice Goal Status [id number]:8517110) (None)  Voice Discharge Status [id number]) (None)  Other  Speech-Language Pathology Functional Limitation [id number]:978821) (None)  Other Speech-Language Pathology Functional Limitation Goal Status [id number]) (None)  Other Speech-Language Pathology Functional Limitation Discharge Status [id number]) (None)   Gabriel Rainwater MA, CCC-SLP [phone]  McCoy Leah Meryl 03/10/2016, 12:10 PM

## 2016-03-14 ENCOUNTER — Other Ambulatory Visit: Payer: Self-pay | Admitting: Gastroenterology

## 2016-03-14 DIAGNOSIS — K2960 Other gastritis without bleeding: Secondary | ICD-10-CM | POA: Diagnosis not present

## 2016-03-14 DIAGNOSIS — K3189 Other diseases of stomach and duodenum: Secondary | ICD-10-CM | POA: Diagnosis not present

## 2016-03-14 DIAGNOSIS — K2950 Unspecified chronic gastritis without bleeding: Secondary | ICD-10-CM | POA: Diagnosis not present

## 2016-03-14 DIAGNOSIS — K259 Gastric ulcer, unspecified as acute or chronic, without hemorrhage or perforation: Secondary | ICD-10-CM | POA: Diagnosis not present

## 2016-03-14 DIAGNOSIS — R1310 Dysphagia, unspecified: Secondary | ICD-10-CM | POA: Diagnosis not present

## 2016-03-15 ENCOUNTER — Ambulatory Visit (HOSPITAL_COMMUNITY): Payer: Self-pay | Admitting: Clinical

## 2016-03-24 DIAGNOSIS — R8299 Other abnormal findings in urine: Secondary | ICD-10-CM | POA: Diagnosis not present

## 2016-03-24 DIAGNOSIS — K219 Gastro-esophageal reflux disease without esophagitis: Secondary | ICD-10-CM | POA: Diagnosis not present

## 2016-03-24 DIAGNOSIS — E038 Other specified hypothyroidism: Secondary | ICD-10-CM | POA: Diagnosis not present

## 2016-03-31 DIAGNOSIS — F5104 Psychophysiologic insomnia: Secondary | ICD-10-CM | POA: Diagnosis not present

## 2016-03-31 DIAGNOSIS — M545 Low back pain: Secondary | ICD-10-CM | POA: Diagnosis not present

## 2016-03-31 DIAGNOSIS — R61 Generalized hyperhidrosis: Secondary | ICD-10-CM | POA: Diagnosis not present

## 2016-03-31 DIAGNOSIS — Z1389 Encounter for screening for other disorder: Secondary | ICD-10-CM | POA: Diagnosis not present

## 2016-03-31 DIAGNOSIS — F418 Other specified anxiety disorders: Secondary | ICD-10-CM | POA: Diagnosis not present

## 2016-03-31 DIAGNOSIS — E668 Other obesity: Secondary | ICD-10-CM | POA: Diagnosis not present

## 2016-03-31 DIAGNOSIS — E038 Other specified hypothyroidism: Secondary | ICD-10-CM | POA: Diagnosis not present

## 2016-03-31 DIAGNOSIS — R3129 Other microscopic hematuria: Secondary | ICD-10-CM | POA: Diagnosis not present

## 2016-03-31 DIAGNOSIS — J45998 Other asthma: Secondary | ICD-10-CM | POA: Diagnosis not present

## 2016-03-31 DIAGNOSIS — Z6838 Body mass index (BMI) 38.0-38.9, adult: Secondary | ICD-10-CM | POA: Diagnosis not present

## 2016-03-31 DIAGNOSIS — Z0000 Encounter for general adult medical examination without abnormal findings: Secondary | ICD-10-CM | POA: Diagnosis not present

## 2016-04-02 ENCOUNTER — Other Ambulatory Visit: Payer: Self-pay | Admitting: Internal Medicine

## 2016-04-02 DIAGNOSIS — Z1231 Encounter for screening mammogram for malignant neoplasm of breast: Secondary | ICD-10-CM

## 2016-04-07 ENCOUNTER — Ambulatory Visit (HOSPITAL_COMMUNITY): Payer: Self-pay | Admitting: Clinical

## 2016-04-08 ENCOUNTER — Ambulatory Visit
Admission: RE | Admit: 2016-04-08 | Discharge: 2016-04-08 | Disposition: A | Discharge status: Home / Self Care | Payer: PPO | Source: Ambulatory Visit | Attending: Internal Medicine | Admitting: Internal Medicine

## 2016-04-08 DIAGNOSIS — Z1231 Encounter for screening mammogram for malignant neoplasm of breast: Secondary | ICD-10-CM | POA: Diagnosis not present

## 2016-04-13 ENCOUNTER — Ambulatory Visit (HOSPITAL_COMMUNITY): Payer: Self-pay | Admitting: Psychiatry

## 2016-04-27 DIAGNOSIS — K2950 Unspecified chronic gastritis without bleeding: Secondary | ICD-10-CM | POA: Diagnosis not present

## 2016-04-27 DIAGNOSIS — R1314 Dysphagia, pharyngoesophageal phase: Secondary | ICD-10-CM | POA: Diagnosis not present

## 2016-04-27 DIAGNOSIS — Z86010 Personal history of colonic polyps: Secondary | ICD-10-CM | POA: Diagnosis not present

## 2016-05-05 DIAGNOSIS — M7751 Other enthesopathy of right foot: Secondary | ICD-10-CM | POA: Diagnosis not present

## 2016-05-05 DIAGNOSIS — M7661 Achilles tendinitis, right leg: Secondary | ICD-10-CM | POA: Diagnosis not present

## 2016-05-10 ENCOUNTER — Ambulatory Visit (HOSPITAL_COMMUNITY)
Admission: EM | Admit: 2016-05-10 | Discharge: 2016-05-10 | Disposition: A | Discharge status: Home / Self Care | Payer: PPO | Attending: Family Medicine | Admitting: Family Medicine

## 2016-05-10 ENCOUNTER — Encounter (HOSPITAL_COMMUNITY): Payer: Self-pay | Admitting: Emergency Medicine

## 2016-05-10 ENCOUNTER — Ambulatory Visit (INDEPENDENT_AMBULATORY_CARE_PROVIDER_SITE_OTHER): Payer: PPO

## 2016-05-10 DIAGNOSIS — S0990XA Unspecified injury of head, initial encounter: Secondary | ICD-10-CM

## 2016-05-10 DIAGNOSIS — S8002XA Contusion of left knee, initial encounter: Secondary | ICD-10-CM

## 2016-05-10 DIAGNOSIS — S0090XA Unspecified superficial injury of unspecified part of head, initial encounter: Secondary | ICD-10-CM | POA: Diagnosis not present

## 2016-05-10 DIAGNOSIS — W19XXXA Unspecified fall, initial encounter: Secondary | ICD-10-CM | POA: Diagnosis not present

## 2016-05-10 DIAGNOSIS — I951 Orthostatic hypotension: Secondary | ICD-10-CM

## 2016-05-10 DIAGNOSIS — M549 Dorsalgia, unspecified: Secondary | ICD-10-CM

## 2016-05-10 DIAGNOSIS — G8929 Other chronic pain: Secondary | ICD-10-CM

## 2016-05-10 DIAGNOSIS — M25512 Pain in left shoulder: Secondary | ICD-10-CM

## 2016-05-10 DIAGNOSIS — M7989 Other specified soft tissue disorders: Secondary | ICD-10-CM | POA: Diagnosis not present

## 2016-05-10 DIAGNOSIS — S60212A Contusion of left wrist, initial encounter: Secondary | ICD-10-CM

## 2016-05-10 MED ORDER — IBUPROFEN 800 MG PO TABS
ORAL_TABLET | ORAL | Status: AC
  Filled 2016-05-10: qty 1

## 2016-05-10 MED ORDER — IBUPROFEN 800 MG PO TABS
800.0000 mg | ORAL_TABLET | Freq: Once | ORAL | Status: AC
  Administered 2016-05-10: 800 mg via ORAL

## 2016-05-10 MED ORDER — SODIUM CHLORIDE 0.9 % IV BOLUS (SEPSIS)
1000.0000 mL | Freq: Once | INTRAVENOUS | Status: AC
  Administered 2016-05-10: 1000 mL via INTRAVENOUS

## 2016-05-10 NOTE — ED Triage Notes (Signed)
Patient has multiple joint and tendon issues.  Patient was stepping up into an RV at her home.  Patient reports when trying to step up, left knee gave away and patient fell forward on both knees and hit left wrist on a box on floor, then hit head.  Patient has bruises to both knees and left wrist

## 2016-05-10 NOTE — ED Notes (Signed)
notifiied np Janne Napoleon of low 02 sat in supine position

## 2016-05-10 NOTE — ED Provider Notes (Signed)
CSN: [id number]     Arrival date & time 05/10/16  1751 History   First MD Initiated Contact with Patient 05/10/16 1851     Chief Complaint  Patient presents with  . Fall   (Consider location/radiation/quality/duration/timing/severity/associated sxs/prior Treatment) 60 year old obese female states that she was stepping up into her trailer when her right knee gave way and she fell forward going up the stairs into the trailer and striking the countertop and then hitting a stool on the way down to the floor. She is complaining of pain to the bilateral knees, the left wrist where she struck it on the counter, the left shoulder where she struck it on a stool and the left parietal scalp where she struck her head on another object. She states there was no loss of consciousness. No lethargy or problems with memory or concentration. She has multiple musculoskeletal chronic pain complaints involving joints in the back. The more acute complaints are in both knees, left wrist, left shoulder and the fact that she feels slight she is going to pass out when sitting up. She has a history of knee surgery in the right knee, states it was replaced one year ago and then in June of this year there was additional surgery to treat scarred tissue.      Past Medical History:  Diagnosis Date  . Anxiety   . Arthritis   . Balance problems   . Complication of anesthesia    pt states has difficulty with being put to sleep   . Falls   . Family history of adverse reaction to anesthesia    pts daughter has N&V  . Fatigue   . Hemorrhoids   . History of blood clots   . History of blood transfusion    2006  . History of bronchitis   . History of chicken pox   . History of frequent urinary tract infections   . History of gallstones   . History of jaundice   . History of kidney stones   . History of measles as a child   . History of mumps as a child   . Hypothyroidism   . Insomnia   . Night sweats   . Pneumonia     hx of   . Polycythemia   . PONV (postoperative nausea and vomiting)   . Thyroid disease   . Tinnitus    Past Surgical History:  Procedure Laterality Date  . ABDOMINAL HYSTERECTOMY    . BACK SURGERY    . CHOLECYSTECTOMY    . KNEE ARTHROSCOPY Right 01/07/2016   Procedure: ARTHROSCOPY RIGHT KNEE, DEBRIDEMENT OF ARTHROFIBROSIS, AND EXAM UNDER ANESTHESIA;  Surgeon: Susa Day, MD;  Location: [facility];  Service: Orthopedics;  Laterality: Right;  . KNEE SURGERY    . left foot surgery      to repair break - 2002  . menicus tear     bilat;   . TONSILLECTOMY    . TOTAL KNEE ARTHROPLASTY Right 03/27/2015   Procedure: RIGHT TOTAL KNEE ARTHROPLASTY;  Surgeon: Susa Day, MD;  Location: [facility];  Service: Orthopedics;  Laterality: Right;   Family History  Problem Relation Age of Onset  . Hypertension Mother   . Heart attack Father 80   Social History  Substance Use Topics  . Smoking status: Never Smoker  . Smokeless tobacco: Never Used  . Alcohol use No   OB History    No data available     Review of Systems  Constitutional: Negative  for activity change, chills and fever.       Patient prefers to lie down. She states she feels as though she is going to pass out when sitting up for exam. She is examined while supine. She is able to roll self over to the right lateral positions without assistance.  HENT: Negative.   Eyes: Negative.   Respiratory: Negative.   Cardiovascular: Negative.   Gastrointestinal: Negative.   Genitourinary: Negative.   Musculoskeletal: Positive for arthralgias, back pain, gait problem, joint swelling and myalgias. Negative for neck pain and neck stiffness.       As per HPI  Skin: Negative for color change, pallor and rash.  Neurological: Positive for light-headedness. Negative for dizziness, tremors, seizures, syncope, facial asymmetry, speech difficulty, numbness and headaches.  All other systems reviewed and are negative.   Allergies  Cymbalta  [duloxetine hcl]; Acetaminophen; Celebrex [celecoxib]; Povidone iodine; Codeine; and Doxycycline  Home Medications   Prior to Admission medications   Medication Sig Start Date End Date Taking? Authorizing Provider  cyclobenzaprine (FLEXERIL) 10 MG tablet Take 1 tablet (10 mg total) by mouth daily as needed for muscle spasms. 02/12/16   Encarnacion Slates, NP  docusate sodium (COLACE) 100 MG capsule Take 1 capsule (100 mg total) by mouth 2 (two) times daily as needed for mild constipation. 02/12/16   Encarnacion Slates, NP  gabapentin (NEURONTIN) 300 MG capsule Take 1 capsule (300 mg total) by mouth 3 (three) times daily. For agitation/pain management 02/12/16   Encarnacion Slates, NP  levothyroxine (SYNTHROID, LEVOTHROID) 112 MCG tablet Take 1 tablet (112 mcg total) by mouth daily before breakfast. For low functioning thyroid 02/12/16   Encarnacion Slates, NP  naproxen (NAPROSYN) 500 MG tablet Take 1 tablet (500 mg total) by mouth 2 (two) times daily as needed for moderate pain. 02/12/16   Encarnacion Slates, NP  nitrofurantoin (MACRODANTIN) 50 MG capsule Take 1 capsule (50 mg total) by mouth every 6 (six) hours. 02/12/16   Encarnacion Slates, NP  promethazine (PHENERGAN) 25 MG tablet Take 1 tablet (25 mg total) by mouth every 6 (six) hours as needed for nausea or vomiting. 02/12/16   Encarnacion Slates, NP  traZODone (DESYREL) 50 MG tablet Take 1 tablet (50 mg total) by mouth at bedtime. For sleep 02/24/16   Rainey Pines, MD  venlafaxine XR (EFFEXOR-XR) 150 MG 24 hr capsule Take 1 capsule (150 mg total) by mouth daily with breakfast. For depression 02/24/16   Rainey Pines, MD  vitamin B-12 (CYANOCOBALAMIN) 500 MCG tablet Take 2 tablets (1,000 mcg total) by mouth daily. For low B-12 02/12/16   Encarnacion Slates, NP   Meds Ordered and Administered this Visit   Medications  ibuprofen (ADVIL,MOTRIN) tablet 800 mg (not administered)  sodium chloride 0.9 % bolus 1,000 mL (1,000 mLs Intravenous Given 05/10/16 2008)    BP 134/75 (BP Location: Right  Arm)   Pulse 89   Temp 98.2 F (36.8 C) (Oral)   Resp 16   SpO2 100%  Orthostatic VS for the past 24 hrs:  BP- Lying Pulse- Lying BP- Sitting Pulse- Sitting BP- Standing at 0 minutes Pulse- Standing at 0 minutes  05/10/16 1927 110/72 83 108/68 88 105/60 93    Physical Exam  Constitutional: She is oriented to person, place, and time. She appears well-developed and well-nourished.  HENT:  Right Ear: External ear normal.  Left Ear: External ear normal.  Nose: Nose normal.  Mouth/Throat: Oropharynx is clear and moist.  Small area of tenderness to left temporoparietal scalp. No other areas of tenderness. No skin lesions or lacerations. No tenderness to the face or facial bones.  Eyes: EOM are normal. Pupils are equal, round, and reactive to light.  Neck: Normal range of motion. Neck supple.  No cervical tenderness, swelling or deformity. Full range of motion. No evidence of cervical injury.  Cardiovascular: Normal rate, regular rhythm, normal heart sounds and intact distal pulses.   Pulmonary/Chest: Effort normal and breath sounds normal. No respiratory distress. She has no wheezes.  Abdominal: Soft. Bowel sounds are normal. She exhibits no distension.  Musculoskeletal:  Right knee with a midline vertical scar. Patient is able to flex and extend with complete range of motion. Negative drawer, negative valgus, negative internal and external rotation pain. No laxity appreciated. Minor anterior knee tenderness. No swelling or deformities. Left knee with anterior swelling and bruising and tenderness. Flexion limited to just less than 45. Attempts to drawer produces pain in the joint but no laxity appreciated. Negative varus negative valgus. No deformity. Distal neurovascular motor sensory in bilateral extremities intact. Left wrist with bruise to the volar aspect. Demonstrates full range of motion against resistance of the wrist. No point tenderness. No deformity or appreciable swelling. Denies  falling onto wrist with rate of body. Left shoulder range of motion. Minor tenderness only. No deformity, swelling or discoloration. Distal neurovascular motor Sentry is intact. No joint tenderness.  Lymphadenopathy:    She has no cervical adenopathy.  Neurological: She is alert and oriented to person, place, and time.  Skin: Skin is warm and dry. Capillary refill takes less than 2 seconds.  Nursing note and vitals reviewed.   Urgent Care Course   Clinical Course    Procedures (including critical care time)  Labs Review Labs Reviewed - No data to display  Imaging Review Dg Knee Complete 4 Views Left  Result Date: 05/10/2016 CLINICAL DATA:  [place] at 1100 hours today going up the steps into in an RV, lateral and infrapatellar pain radiating down leg to ankle, increased with movement, prior meniscal tear LEFT knee EXAM: LEFT KNEE - COMPLETE 4+ VIEW COMPARISON:  01/16/2011 FINDINGS: Osseous demineralization. Tricompartmental osteoarthritic changes with joint space narrowing and spur formation. No acute fracture, dislocation, or bone destruction. No definite knee joint effusion. Significant anterior soft tissue swelling at LEFT knee extending laterally. IMPRESSION: Osseous demineralization with degenerative changes LEFT knee. Soft tissue swelling without definite acute bony abnormalities. Electronically Signed   By: Lavonia Dana M.D.   On: 05/10/2016 20:10     Visual Acuity Review  Right Eye Distance:   Left Eye Distance:   Bilateral Distance:    Right Eye Near:   Left Eye Near:    Bilateral Near:         MDM   1. Fall, initial encounter   2. Minor head injury, initial encounter   3. Contusion of left knee, initial encounter   4. Contusion of left wrist, initial encounter   5. Chronic bilateral back pain, unspecified back location   6. Acute pain of left shoulder   7. Orthostasis    May take Aleve as needed for pain. Apply ice to the areas of pain in your joints. Expect to  be sore for the next 24-36 hours. If you develop any new symptoms such as severe headache, vomiting, problems with vision, speech, swallowing or weakness on one side of the body, confusion or disorientation seek medical attention promptly. Follow-up with your doctor as  needed. Read your included instructions. Meds ordered this encounter  Medications  . sodium chloride 0.9 % bolus 1,000 mL  . ibuprofen (ADVIL,MOTRIN) tablet 800 mg   Patient received 1 L of normal saline and 800 mg of ibuprofen by mouth. She states she feels generally well and radiated to go home. She denies the need to have additional analgesics and will take Aleve for pain.    Janne Napoleon, NP 05/10/16 2104

## 2016-05-10 NOTE — Discharge Instructions (Signed)
May take Aleve as needed for pain. Apply ice to the areas of pain in your joints. Expect to be sore for the next 24-36 hours. If you develop any new symptoms such as severe headache, vomiting, problems with vision, speech, swallowing or weakness on one side of the body, confusion or disorientation seek medical attention promptly. Follow-up with your doctor as needed. Read your included instructions.

## 2016-05-10 NOTE — ED Notes (Signed)
Patient transported to X-ray 

## 2016-05-23 DIAGNOSIS — M25562 Pain in left knee: Secondary | ICD-10-CM | POA: Diagnosis not present

## 2016-05-23 DIAGNOSIS — S8002XA Contusion of left knee, initial encounter: Secondary | ICD-10-CM | POA: Diagnosis not present

## 2016-06-23 DIAGNOSIS — M79609 Pain in unspecified limb: Secondary | ICD-10-CM | POA: Diagnosis not present

## 2016-06-23 DIAGNOSIS — M79641 Pain in right hand: Secondary | ICD-10-CM | POA: Diagnosis not present

## 2016-06-23 DIAGNOSIS — M1811 Unilateral primary osteoarthritis of first carpometacarpal joint, right hand: Secondary | ICD-10-CM | POA: Diagnosis not present

## 2016-06-27 IMAGING — DX DG KNEE 1-2V PORT*R*
2 series · 2 of 2 positions shown · non-contrast
Comparison: 03/19/2015.

CLINICAL DATA: Postop right knee arthroplasty.  Initial encounter.

EXAM:
PORTABLE RIGHT KNEE - 1-2 VIEW

[knee ap]
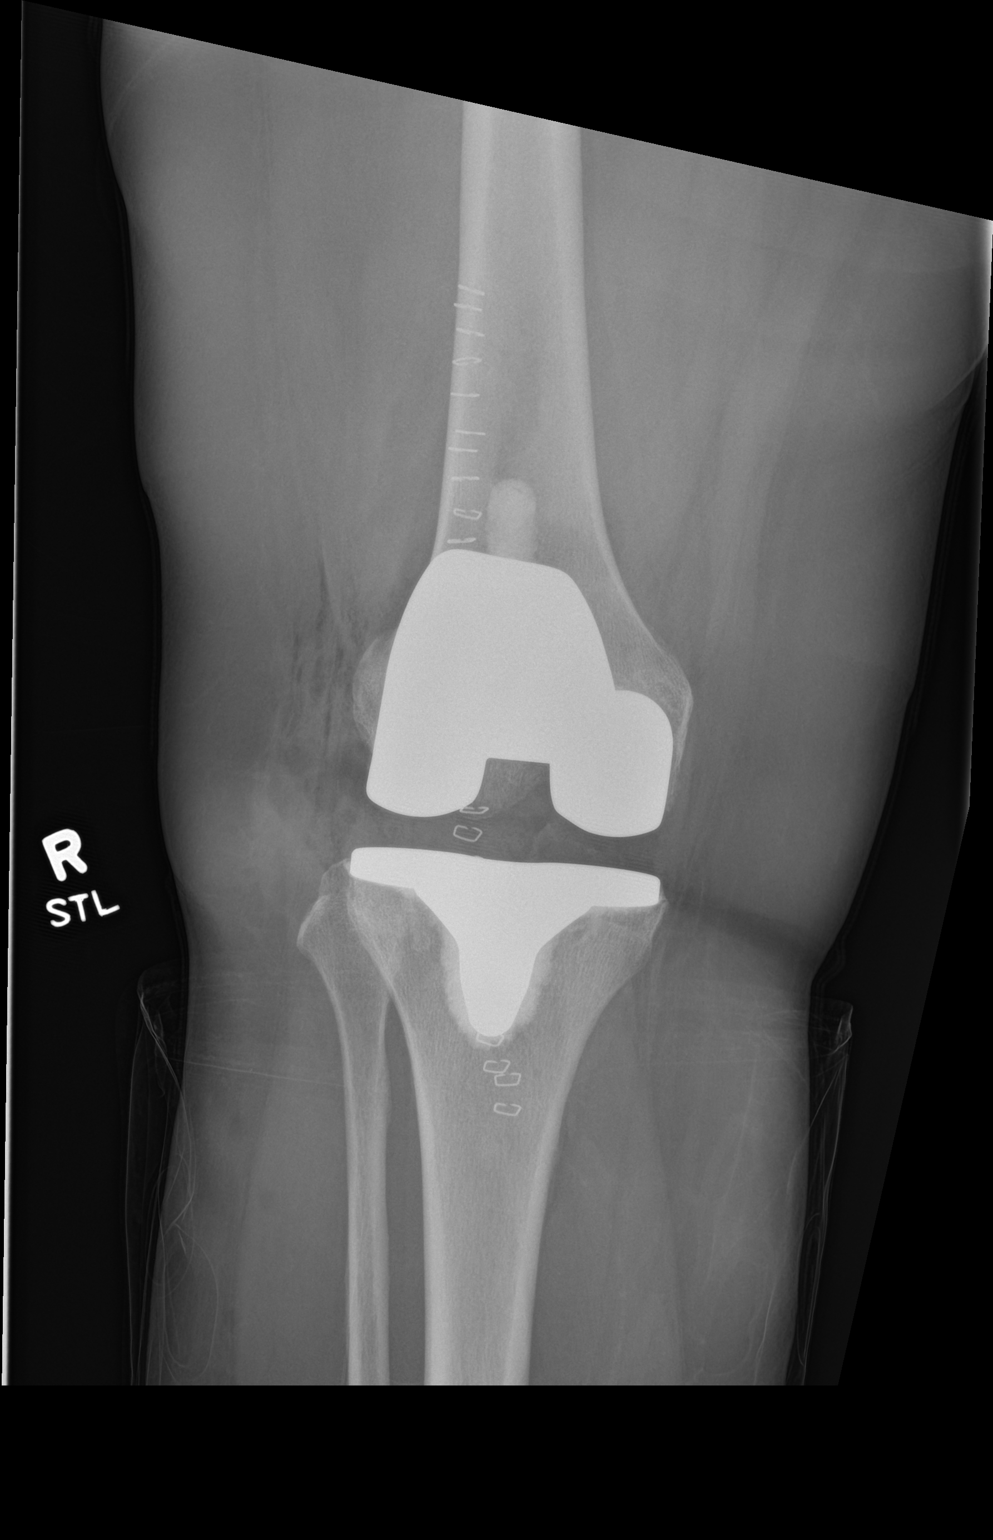

[knee lat]
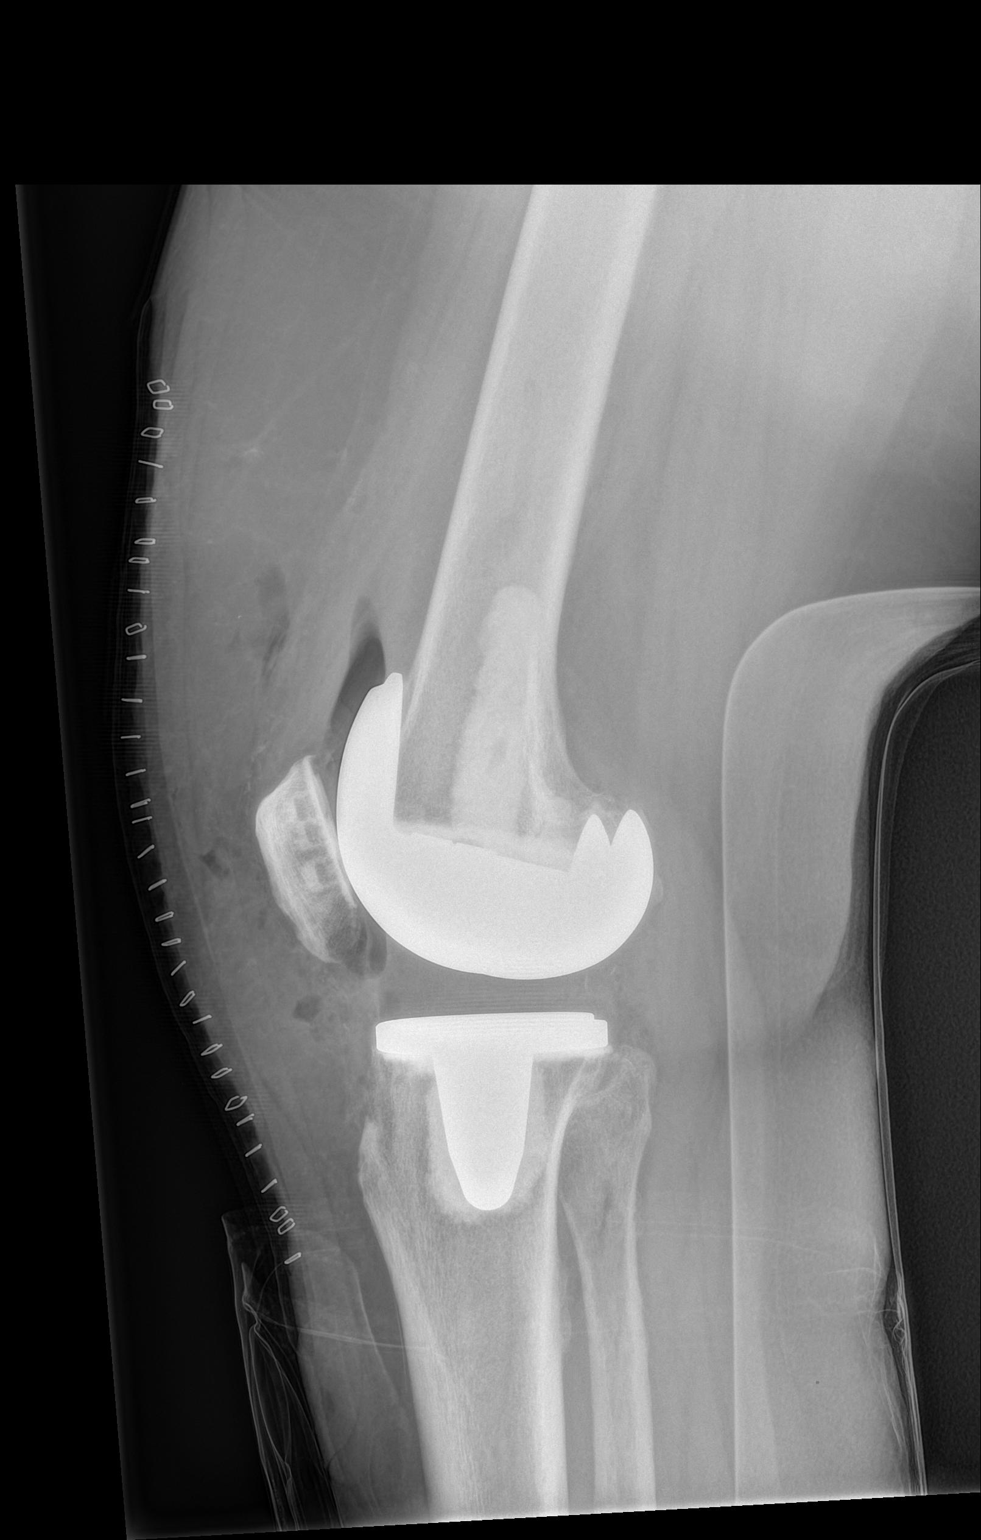

[2 of 2 positions shown; findings below may reference images not displayed]

FINDINGS: Patient is status post interval total knee arthroplasty. The
hardware appears well positioned. There is no evidence of acute
fracture or dislocation. There is a small amount of air within the
joint and anterior soft tissues. Anterior skin staples are noted.
IMPRESSION: No demonstrated complication following right total knee
arthroplasty.

## 2016-07-07 DIAGNOSIS — S8002XD Contusion of left knee, subsequent encounter: Secondary | ICD-10-CM | POA: Diagnosis not present

## 2016-08-16 DIAGNOSIS — Z96651 Presence of right artificial knee joint: Secondary | ICD-10-CM | POA: Diagnosis not present

## 2016-08-25 DIAGNOSIS — M1811 Unilateral primary osteoarthritis of first carpometacarpal joint, right hand: Secondary | ICD-10-CM | POA: Diagnosis not present

## 2016-08-25 DIAGNOSIS — M79641 Pain in right hand: Secondary | ICD-10-CM | POA: Diagnosis not present

## 2016-08-30 DIAGNOSIS — G8929 Other chronic pain: Secondary | ICD-10-CM | POA: Diagnosis not present

## 2016-08-30 DIAGNOSIS — M25561 Pain in right knee: Secondary | ICD-10-CM | POA: Diagnosis not present

## 2016-08-30 DIAGNOSIS — Z96651 Presence of right artificial knee joint: Secondary | ICD-10-CM | POA: Diagnosis not present

## 2016-09-05 DIAGNOSIS — Z6841 Body Mass Index (BMI) 40.0 and over, adult: Secondary | ICD-10-CM | POA: Diagnosis not present

## 2016-09-05 DIAGNOSIS — R05 Cough: Secondary | ICD-10-CM | POA: Diagnosis not present

## 2016-09-05 DIAGNOSIS — R300 Dysuria: Secondary | ICD-10-CM | POA: Diagnosis not present

## 2016-09-05 DIAGNOSIS — N390 Urinary tract infection, site not specified: Secondary | ICD-10-CM | POA: Diagnosis not present

## 2016-09-05 DIAGNOSIS — J40 Bronchitis, not specified as acute or chronic: Secondary | ICD-10-CM | POA: Diagnosis not present

## 2016-09-06 DIAGNOSIS — M25561 Pain in right knee: Secondary | ICD-10-CM | POA: Diagnosis not present

## 2016-09-06 DIAGNOSIS — G8929 Other chronic pain: Secondary | ICD-10-CM | POA: Diagnosis not present

## 2016-09-28 DIAGNOSIS — M1811 Unilateral primary osteoarthritis of first carpometacarpal joint, right hand: Secondary | ICD-10-CM | POA: Diagnosis not present

## 2016-10-26 DIAGNOSIS — N951 Menopausal and female climacteric states: Secondary | ICD-10-CM | POA: Diagnosis not present

## 2016-10-26 DIAGNOSIS — R300 Dysuria: Secondary | ICD-10-CM | POA: Diagnosis not present

## 2016-10-26 DIAGNOSIS — R2233 Localized swelling, mass and lump, upper limb, bilateral: Secondary | ICD-10-CM | POA: Diagnosis not present

## 2016-10-26 DIAGNOSIS — J302 Other seasonal allergic rhinitis: Secondary | ICD-10-CM | POA: Diagnosis not present

## 2016-10-26 DIAGNOSIS — E038 Other specified hypothyroidism: Secondary | ICD-10-CM | POA: Diagnosis not present

## 2016-10-26 DIAGNOSIS — Z6839 Body mass index (BMI) 39.0-39.9, adult: Secondary | ICD-10-CM | POA: Diagnosis not present

## 2016-10-26 DIAGNOSIS — R8299 Other abnormal findings in urine: Secondary | ICD-10-CM | POA: Diagnosis not present

## 2016-10-26 DIAGNOSIS — L989 Disorder of the skin and subcutaneous tissue, unspecified: Secondary | ICD-10-CM | POA: Diagnosis not present

## 2016-10-26 DIAGNOSIS — R358 Other polyuria: Secondary | ICD-10-CM | POA: Diagnosis not present

## 2016-10-26 DIAGNOSIS — H578 Other specified disorders of eye and adnexa: Secondary | ICD-10-CM | POA: Diagnosis not present

## 2016-10-27 DIAGNOSIS — M7542 Impingement syndrome of left shoulder: Secondary | ICD-10-CM | POA: Diagnosis not present

## 2016-10-27 DIAGNOSIS — M25512 Pain in left shoulder: Secondary | ICD-10-CM | POA: Diagnosis not present

## 2016-11-17 DIAGNOSIS — G8918 Other acute postprocedural pain: Secondary | ICD-10-CM | POA: Diagnosis not present

## 2016-11-17 DIAGNOSIS — M1851 Other unilateral secondary osteoarthritis of first carpometacarpal joint, right hand: Secondary | ICD-10-CM | POA: Diagnosis not present

## 2016-11-17 DIAGNOSIS — M1811 Unilateral primary osteoarthritis of first carpometacarpal joint, right hand: Secondary | ICD-10-CM | POA: Diagnosis not present

## 2016-11-22 DIAGNOSIS — E119 Type 2 diabetes mellitus without complications: Secondary | ICD-10-CM

## 2016-11-22 HISTORY — DX: Type 2 diabetes mellitus without complications: E11.9

## 2016-11-22 HISTORY — PX: WRIST SURGERY: SHX841

## 2016-11-29 DIAGNOSIS — Z4789 Encounter for other orthopedic aftercare: Secondary | ICD-10-CM | POA: Diagnosis not present

## 2016-11-29 DIAGNOSIS — M1811 Unilateral primary osteoarthritis of first carpometacarpal joint, right hand: Secondary | ICD-10-CM | POA: Diagnosis not present

## 2016-12-01 DIAGNOSIS — Z4789 Encounter for other orthopedic aftercare: Secondary | ICD-10-CM | POA: Diagnosis not present

## 2016-12-01 DIAGNOSIS — M1811 Unilateral primary osteoarthritis of first carpometacarpal joint, right hand: Secondary | ICD-10-CM | POA: Diagnosis not present

## 2016-12-05 DIAGNOSIS — M1811 Unilateral primary osteoarthritis of first carpometacarpal joint, right hand: Secondary | ICD-10-CM | POA: Diagnosis not present

## 2016-12-05 DIAGNOSIS — Z4789 Encounter for other orthopedic aftercare: Secondary | ICD-10-CM | POA: Diagnosis not present

## 2016-12-08 DIAGNOSIS — E119 Type 2 diabetes mellitus without complications: Secondary | ICD-10-CM | POA: Diagnosis not present

## 2016-12-08 DIAGNOSIS — R739 Hyperglycemia, unspecified: Secondary | ICD-10-CM | POA: Diagnosis not present

## 2016-12-08 DIAGNOSIS — Z6839 Body mass index (BMI) 39.0-39.9, adult: Secondary | ICD-10-CM | POA: Diagnosis not present

## 2016-12-09 DIAGNOSIS — Z4789 Encounter for other orthopedic aftercare: Secondary | ICD-10-CM | POA: Diagnosis not present

## 2016-12-09 DIAGNOSIS — M1811 Unilateral primary osteoarthritis of first carpometacarpal joint, right hand: Secondary | ICD-10-CM | POA: Diagnosis not present

## 2016-12-20 DIAGNOSIS — M542 Cervicalgia: Secondary | ICD-10-CM | POA: Diagnosis not present

## 2016-12-20 DIAGNOSIS — M50121 Cervical disc disorder at C4-C5 level with radiculopathy: Secondary | ICD-10-CM | POA: Diagnosis not present

## 2016-12-21 DIAGNOSIS — M1811 Unilateral primary osteoarthritis of first carpometacarpal joint, right hand: Secondary | ICD-10-CM | POA: Diagnosis not present

## 2016-12-21 DIAGNOSIS — Z4789 Encounter for other orthopedic aftercare: Secondary | ICD-10-CM | POA: Diagnosis not present

## 2016-12-22 ENCOUNTER — Telehealth: Payer: Self-pay | Admitting: Internal Medicine

## 2016-12-22 DIAGNOSIS — Z6841 Body Mass Index (BMI) 40.0 and over, adult: Secondary | ICD-10-CM | POA: Diagnosis not present

## 2016-12-22 DIAGNOSIS — E119 Type 2 diabetes mellitus without complications: Secondary | ICD-10-CM | POA: Diagnosis not present

## 2016-12-22 DIAGNOSIS — M545 Low back pain: Secondary | ICD-10-CM | POA: Diagnosis not present

## 2016-12-22 NOTE — Telephone Encounter (Signed)
Patient is calling to inquire about becoming a new patient at [facility].   I advised patient to have provider fax referral along with medical notes related to diabetes diagnosis.   She would like a call back to inquire about the providers and what to expect.  Thank you,  -LL

## 2016-12-23 DIAGNOSIS — H16223 Keratoconjunctivitis sicca, not specified as Sjogren's, bilateral: Secondary | ICD-10-CM | POA: Diagnosis not present

## 2016-12-23 DIAGNOSIS — E119 Type 2 diabetes mellitus without complications: Secondary | ICD-10-CM | POA: Diagnosis not present

## 2016-12-29 DIAGNOSIS — Z4789 Encounter for other orthopedic aftercare: Secondary | ICD-10-CM | POA: Diagnosis not present

## 2016-12-29 DIAGNOSIS — M1811 Unilateral primary osteoarthritis of first carpometacarpal joint, right hand: Secondary | ICD-10-CM | POA: Diagnosis not present

## 2017-01-10 DIAGNOSIS — M79641 Pain in right hand: Secondary | ICD-10-CM | POA: Diagnosis not present

## 2017-01-16 DIAGNOSIS — M545 Low back pain: Secondary | ICD-10-CM | POA: Diagnosis not present

## 2017-01-16 DIAGNOSIS — Z6839 Body mass index (BMI) 39.0-39.9, adult: Secondary | ICD-10-CM | POA: Diagnosis not present

## 2017-01-16 DIAGNOSIS — M79671 Pain in right foot: Secondary | ICD-10-CM | POA: Diagnosis not present

## 2017-01-16 DIAGNOSIS — E119 Type 2 diabetes mellitus without complications: Secondary | ICD-10-CM | POA: Diagnosis not present

## 2017-01-16 DIAGNOSIS — M79641 Pain in right hand: Secondary | ICD-10-CM | POA: Diagnosis not present

## 2017-01-18 DIAGNOSIS — M7661 Achilles tendinitis, right leg: Secondary | ICD-10-CM | POA: Diagnosis not present

## 2017-01-18 DIAGNOSIS — M7731 Calcaneal spur, right foot: Secondary | ICD-10-CM | POA: Diagnosis not present

## 2017-01-26 DIAGNOSIS — M79641 Pain in right hand: Secondary | ICD-10-CM | POA: Diagnosis not present

## 2017-01-26 DIAGNOSIS — M1811 Unilateral primary osteoarthritis of first carpometacarpal joint, right hand: Secondary | ICD-10-CM | POA: Diagnosis not present

## 2017-02-15 DIAGNOSIS — Z6839 Body mass index (BMI) 39.0-39.9, adult: Secondary | ICD-10-CM | POA: Diagnosis not present

## 2017-02-15 DIAGNOSIS — E119 Type 2 diabetes mellitus without complications: Secondary | ICD-10-CM | POA: Diagnosis not present

## 2017-02-16 DIAGNOSIS — M79641 Pain in right hand: Secondary | ICD-10-CM | POA: Diagnosis not present

## 2017-02-23 DIAGNOSIS — M1811 Unilateral primary osteoarthritis of first carpometacarpal joint, right hand: Secondary | ICD-10-CM | POA: Diagnosis not present

## 2017-02-24 DIAGNOSIS — M5031 Other cervical disc degeneration,  high cervical region: Secondary | ICD-10-CM | POA: Diagnosis not present

## 2017-02-24 DIAGNOSIS — M542 Cervicalgia: Secondary | ICD-10-CM | POA: Diagnosis not present

## 2017-02-24 DIAGNOSIS — M50121 Cervical disc disorder at C4-C5 level with radiculopathy: Secondary | ICD-10-CM | POA: Diagnosis not present

## 2017-03-01 DIAGNOSIS — D1801 Hemangioma of skin and subcutaneous tissue: Secondary | ICD-10-CM | POA: Diagnosis not present

## 2017-03-01 DIAGNOSIS — D2371 Other benign neoplasm of skin of right lower limb, including hip: Secondary | ICD-10-CM | POA: Diagnosis not present

## 2017-03-01 DIAGNOSIS — L821 Other seborrheic keratosis: Secondary | ICD-10-CM | POA: Diagnosis not present

## 2017-03-01 DIAGNOSIS — L918 Other hypertrophic disorders of the skin: Secondary | ICD-10-CM | POA: Diagnosis not present

## 2017-03-01 DIAGNOSIS — L814 Other melanin hyperpigmentation: Secondary | ICD-10-CM | POA: Diagnosis not present

## 2017-03-01 DIAGNOSIS — L720 Epidermal cyst: Secondary | ICD-10-CM | POA: Diagnosis not present

## 2017-03-01 DIAGNOSIS — D692 Other nonthrombocytopenic purpura: Secondary | ICD-10-CM | POA: Diagnosis not present

## 2017-03-10 DIAGNOSIS — M9261 Juvenile osteochondrosis of tarsus, right ankle: Secondary | ICD-10-CM | POA: Diagnosis not present

## 2017-03-10 DIAGNOSIS — M7661 Achilles tendinitis, right leg: Secondary | ICD-10-CM | POA: Diagnosis not present

## 2017-03-21 DIAGNOSIS — Z6838 Body mass index (BMI) 38.0-38.9, adult: Secondary | ICD-10-CM | POA: Diagnosis not present

## 2017-03-21 DIAGNOSIS — E119 Type 2 diabetes mellitus without complications: Secondary | ICD-10-CM | POA: Diagnosis not present

## 2017-03-21 DIAGNOSIS — Z0000 Encounter for general adult medical examination without abnormal findings: Secondary | ICD-10-CM | POA: Diagnosis not present

## 2017-03-22 DIAGNOSIS — M7661 Achilles tendinitis, right leg: Secondary | ICD-10-CM | POA: Diagnosis not present

## 2017-04-03 DIAGNOSIS — E038 Other specified hypothyroidism: Secondary | ICD-10-CM | POA: Diagnosis not present

## 2017-04-03 DIAGNOSIS — N390 Urinary tract infection, site not specified: Secondary | ICD-10-CM | POA: Diagnosis not present

## 2017-04-03 DIAGNOSIS — M7661 Achilles tendinitis, right leg: Secondary | ICD-10-CM | POA: Diagnosis not present

## 2017-04-03 DIAGNOSIS — R8299 Other abnormal findings in urine: Secondary | ICD-10-CM | POA: Diagnosis not present

## 2017-04-03 DIAGNOSIS — E119 Type 2 diabetes mellitus without complications: Secondary | ICD-10-CM | POA: Diagnosis not present

## 2017-04-03 DIAGNOSIS — Z79899 Other long term (current) drug therapy: Secondary | ICD-10-CM | POA: Diagnosis not present

## 2017-04-05 DIAGNOSIS — M7661 Achilles tendinitis, right leg: Secondary | ICD-10-CM | POA: Diagnosis not present

## 2017-04-07 DIAGNOSIS — Z6839 Body mass index (BMI) 39.0-39.9, adult: Secondary | ICD-10-CM | POA: Diagnosis not present

## 2017-04-07 DIAGNOSIS — F418 Other specified anxiety disorders: Secondary | ICD-10-CM | POA: Diagnosis not present

## 2017-04-07 DIAGNOSIS — M545 Low back pain: Secondary | ICD-10-CM | POA: Diagnosis not present

## 2017-04-07 DIAGNOSIS — E038 Other specified hypothyroidism: Secondary | ICD-10-CM | POA: Diagnosis not present

## 2017-04-07 DIAGNOSIS — M7661 Achilles tendinitis, right leg: Secondary | ICD-10-CM | POA: Diagnosis not present

## 2017-04-07 DIAGNOSIS — R252 Cramp and spasm: Secondary | ICD-10-CM | POA: Diagnosis not present

## 2017-04-07 DIAGNOSIS — Z23 Encounter for immunization: Secondary | ICD-10-CM | POA: Diagnosis not present

## 2017-04-07 DIAGNOSIS — Z1389 Encounter for screening for other disorder: Secondary | ICD-10-CM | POA: Diagnosis not present

## 2017-04-07 DIAGNOSIS — J45998 Other asthma: Secondary | ICD-10-CM | POA: Diagnosis not present

## 2017-04-07 DIAGNOSIS — Z0000 Encounter for general adult medical examination without abnormal findings: Secondary | ICD-10-CM | POA: Diagnosis not present

## 2017-04-07 DIAGNOSIS — F5104 Psychophysiologic insomnia: Secondary | ICD-10-CM | POA: Diagnosis not present

## 2017-04-07 DIAGNOSIS — E119 Type 2 diabetes mellitus without complications: Secondary | ICD-10-CM | POA: Diagnosis not present

## 2017-04-10 DIAGNOSIS — M7661 Achilles tendinitis, right leg: Secondary | ICD-10-CM | POA: Diagnosis not present

## 2017-04-12 DIAGNOSIS — M7661 Achilles tendinitis, right leg: Secondary | ICD-10-CM | POA: Diagnosis not present

## 2017-04-14 DIAGNOSIS — M7661 Achilles tendinitis, right leg: Secondary | ICD-10-CM | POA: Diagnosis not present

## 2017-05-09 DIAGNOSIS — M545 Low back pain: Secondary | ICD-10-CM | POA: Diagnosis not present

## 2017-05-09 DIAGNOSIS — M50121 Cervical disc disorder at C4-C5 level with radiculopathy: Secondary | ICD-10-CM | POA: Diagnosis not present

## 2017-05-09 DIAGNOSIS — M961 Postlaminectomy syndrome, not elsewhere classified: Secondary | ICD-10-CM | POA: Diagnosis not present

## 2017-05-09 DIAGNOSIS — M5126 Other intervertebral disc displacement, lumbar region: Secondary | ICD-10-CM | POA: Diagnosis not present

## 2017-05-26 DIAGNOSIS — M7661 Achilles tendinitis, right leg: Secondary | ICD-10-CM | POA: Diagnosis not present

## 2017-06-01 DIAGNOSIS — L720 Epidermal cyst: Secondary | ICD-10-CM | POA: Diagnosis not present

## 2017-06-02 DIAGNOSIS — M7661 Achilles tendinitis, right leg: Secondary | ICD-10-CM | POA: Diagnosis not present

## 2017-06-12 DIAGNOSIS — M7661 Achilles tendinitis, right leg: Secondary | ICD-10-CM | POA: Diagnosis not present

## 2017-06-12 DIAGNOSIS — M9261 Juvenile osteochondrosis of tarsus, right ankle: Secondary | ICD-10-CM | POA: Diagnosis not present

## 2017-06-12 DIAGNOSIS — M6701 Short Achilles tendon (acquired), right ankle: Secondary | ICD-10-CM | POA: Diagnosis not present

## 2017-07-04 DIAGNOSIS — M6701 Short Achilles tendon (acquired), right ankle: Secondary | ICD-10-CM | POA: Diagnosis not present

## 2017-07-04 DIAGNOSIS — M66871 Spontaneous rupture of other tendons, right ankle and foot: Secondary | ICD-10-CM | POA: Diagnosis not present

## 2017-07-04 DIAGNOSIS — M7661 Achilles tendinitis, right leg: Secondary | ICD-10-CM | POA: Diagnosis not present

## 2017-07-04 DIAGNOSIS — M25774 Osteophyte, right foot: Secondary | ICD-10-CM | POA: Diagnosis not present

## 2017-07-04 DIAGNOSIS — G8918 Other acute postprocedural pain: Secondary | ICD-10-CM | POA: Diagnosis not present

## 2017-07-04 DIAGNOSIS — M7731 Calcaneal spur, right foot: Secondary | ICD-10-CM | POA: Diagnosis not present

## 2017-09-15 DIAGNOSIS — M170 Bilateral primary osteoarthritis of knee: Secondary | ICD-10-CM | POA: Diagnosis not present

## 2017-09-15 DIAGNOSIS — M25561 Pain in right knee: Secondary | ICD-10-CM | POA: Diagnosis not present

## 2017-09-15 DIAGNOSIS — Z96651 Presence of right artificial knee joint: Secondary | ICD-10-CM | POA: Diagnosis not present

## 2017-09-20 ENCOUNTER — Ambulatory Visit: Payer: Self-pay | Admitting: Orthopedic Surgery

## 2017-09-20 NOTE — H&P (Signed)
Carol [name] is an 62 y.o. female.   Chief Complaint: right knee pain, instability HPI:  Patient reports right and knee. She reports 4 days. She reports pain level 10/10. She reports acute. The patient is taking Advil and Oxycodone. Right knee is locking and giving way she feels it is similar to when she had scar tissue we did arthroscopic surgery  Past Medical History:  Diagnosis Date  . Anxiety   . Arthritis   . Balance problems   . Complication of anesthesia    pt states has difficulty with being put to sleep   . Falls   . Family history of adverse reaction to anesthesia    pts daughter has N&V  . Fatigue   . Hemorrhoids   . History of blood clots   . History of blood transfusion    2006  . History of bronchitis   . History of chicken pox   . History of frequent urinary tract infections   . History of gallstones   . History of jaundice   . History of kidney stones   . History of measles as a child   . History of mumps as a child   . Hypothyroidism   . Insomnia   . Night sweats   . Pneumonia    hx of   . Polycythemia   . PONV (postoperative nausea and vomiting)   . Thyroid disease   . Tinnitus     Past Surgical History:  Procedure Laterality Date  . ABDOMINAL HYSTERECTOMY    . BACK SURGERY    . CHOLECYSTECTOMY    . KNEE ARTHROSCOPY Right 01/07/2016   Procedure: ARTHROSCOPY RIGHT KNEE, DEBRIDEMENT OF ARTHROFIBROSIS, AND EXAM UNDER ANESTHESIA;  Surgeon: Susa Day, MD;  Location: [facility];  Service: Orthopedics;  Laterality: Right;  . KNEE SURGERY    . left foot surgery      to repair break - 2002  . menicus tear     bilat;   . TONSILLECTOMY    . TOTAL KNEE ARTHROPLASTY Right 03/27/2015   Procedure: RIGHT TOTAL KNEE ARTHROPLASTY;  Surgeon: Susa Day, MD;  Location: [facility];  Service: Orthopedics;  Laterality: Right;    Family History  Problem Relation Age of Onset  . Hypertension Mother   . Heart attack Father 66   Social History:  reports that  has  never smoked. she has never used smokeless tobacco. She reports that she does not drink alcohol or use drugs.  Allergies:  Allergies  Allergen Reactions  . Cymbalta [Duloxetine Hcl] Rash  . Acetaminophen Nausea And Vomiting  . Celebrex [Celecoxib] Itching  . Povidone Iodine Hives  . Codeine Rash  . Doxycycline Rash     (Not in a hospital admission)  No results found for this or any previous visit (from the past 48 hour(s)). No results found.  Review of Systems  Constitutional: Negative.   HENT: Negative.   Eyes: Negative.   Respiratory: Negative.   Cardiovascular: Negative.   Gastrointestinal: Negative.   Genitourinary: Negative.   Musculoskeletal: Positive for back pain and joint pain.  Skin: Negative.   Neurological: Negative.   Psychiatric/Behavioral: Negative.     There were no vitals taken for this visit. Physical Exam  Constitutional: She is oriented to person, place, and time. She appears well-developed and well-nourished.  HENT:  Head: Normocephalic.  Eyes: Pupils are equal, round, and reactive to light.  Neck: Normal range of motion.  Cardiovascular: Normal rate.  Respiratory: Effort normal.  GI: Soft.  Musculoskeletal:  Constitutional General Appearance: healthy-appearing, NAD  Gait and Station Appearance: antalgic gait  Cardiovascular System Arterial Pulses Left: femoral normal, popliteal normal, dorsalis pedis normal, posterior tibialis normal Edema Left: no edema Varicosities Left: no varicosities  Lymph Nodes Inspection/Palpation Left: no inguinal LAD  Knees Inspection Left: no deformity Bony Palpation Left: no tenderness of the superior pole patella, no tenderness of the inferior pole patella, no tenderness of the tibial tubercle, no tenderness of the medial joint line, no tenderness of the lateral joint line, no tenderness of the medial tibial plateau, no tenderness of Gerdy's tubercle, no tenderness of the neck of fibula Soft Tissue  Palpation Left: no tenderness of the quadriceps tendon, no tenderness of the prepatellar bursa, no tenderness of the patellar tendon, no tenderness of the medial collateral ligament, no tenderness of the infrapatellar tendon Active Range of Motion Left: normal Stability Left: no laxity, no ligamentous instability, anterior drawer sign negative, Lachman test negative Special Tests Left: McMurray's test negative Strength Left: flexion 5/5, extension 5/5, no hamstring weakness, no quadriceps weakness  Skin Left Lower Extremity: normal  Neurologic Ankle Reflex Left: normal (2) Knee Reflex Left: normal (2) Sensation on the Left: L2 normal, L3 normal, L4 normal, L5 normal, S1 normal  Psychiatric Mood and Affect: active and alert, normal mood Tender medial joint line left knee patellofemoral pain compression. Trace effusion right knee no effusion negative anterior drawer has patellofemoral crepitus.  Neurological: She is alert and oriented to person, place, and time.     Assessment/Plan Patient symptomatically asked arthritis of the left knee. Her x-rays AP and lateral demonstrates medial joint space narrowing patellofemoral arthrosis.  She tolerated the injection well and her left knee. 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 Neutral agreed to proceed with a repeat lysis of adhesion.  Discussed rest and benefits including bleeding infection no change in symptoms or worsening symptoms DVT PE anesthetic complications etc.  She asked for a pain medicine. She is allergic to Tylenol. I gave her OxyIR codon to be taken occasionally now and perioperatively.  Plan right knee EUA, arthroscopy, lysis of adhesions  Carol [name]., PA-C  For Dr. Tonita Cong 09/20/2017, 11:30 AM

## 2017-09-20 NOTE — H&P (View-Only) (Signed)
Carol [name] is an 62 y.o. female.   Chief Complaint: right knee pain, instability HPI:  Patient reports right and knee. She reports 4 days. She reports pain level 10/10. She reports acute. The patient is taking Advil and Oxycodone. Right knee is locking and giving way she feels it is similar to when she had scar tissue we did arthroscopic surgery  Past Medical History:  Diagnosis Date  . Anxiety   . Arthritis   . Balance problems   . Complication of anesthesia    pt states has difficulty with being put to sleep   . Falls   . Family history of adverse reaction to anesthesia    pts daughter has N&V  . Fatigue   . Hemorrhoids   . History of blood clots   . History of blood transfusion    2006  . History of bronchitis   . History of chicken pox   . History of frequent urinary tract infections   . History of gallstones   . History of jaundice   . History of kidney stones   . History of measles as a child   . History of mumps as a child   . Hypothyroidism   . Insomnia   . Night sweats   . Pneumonia    hx of   . Polycythemia   . PONV (postoperative nausea and vomiting)   . Thyroid disease   . Tinnitus     Past Surgical History:  Procedure Laterality Date  . ABDOMINAL HYSTERECTOMY    . BACK SURGERY    . CHOLECYSTECTOMY    . KNEE ARTHROSCOPY Right 01/07/2016   Procedure: ARTHROSCOPY RIGHT KNEE, DEBRIDEMENT OF ARTHROFIBROSIS, AND EXAM UNDER ANESTHESIA;  Surgeon: Susa Day, MD;  Location: [facility];  Service: Orthopedics;  Laterality: Right;  . KNEE SURGERY    . left foot surgery      to repair break - 2002  . menicus tear     bilat;   . TONSILLECTOMY    . TOTAL KNEE ARTHROPLASTY Right 03/27/2015   Procedure: RIGHT TOTAL KNEE ARTHROPLASTY;  Surgeon: Susa Day, MD;  Location: [facility];  Service: Orthopedics;  Laterality: Right;    Family History  Problem Relation Age of Onset  . Hypertension Mother   . Heart attack Father 64   Social History:  reports that  has  never smoked. she has never used smokeless tobacco. She reports that she does not drink alcohol or use drugs.  Allergies:  Allergies  Allergen Reactions  . Cymbalta [Duloxetine Hcl] Rash  . Acetaminophen Nausea And Vomiting  . Celebrex [Celecoxib] Itching  . Povidone Iodine Hives  . Codeine Rash  . Doxycycline Rash     (Not in a hospital admission)  No results found for this or any previous visit (from the past 48 hour(s)). No results found.  Review of Systems  Constitutional: Negative.   HENT: Negative.   Eyes: Negative.   Respiratory: Negative.   Cardiovascular: Negative.   Gastrointestinal: Negative.   Genitourinary: Negative.   Musculoskeletal: Positive for back pain and joint pain.  Skin: Negative.   Neurological: Negative.   Psychiatric/Behavioral: Negative.     There were no vitals taken for this visit. Physical Exam  Constitutional: She is oriented to person, place, and time. She appears well-developed and well-nourished.  HENT:  Head: Normocephalic.  Eyes: Pupils are equal, round, and reactive to light.  Neck: Normal range of motion.  Cardiovascular: Normal rate.  Respiratory: Effort normal.  GI: Soft.  Musculoskeletal:  Constitutional General Appearance: healthy-appearing, NAD  Gait and Station Appearance: antalgic gait  Cardiovascular System Arterial Pulses Left: femoral normal, popliteal normal, dorsalis pedis normal, posterior tibialis normal Edema Left: no edema Varicosities Left: no varicosities  Lymph Nodes Inspection/Palpation Left: no inguinal LAD  Knees Inspection Left: no deformity Bony Palpation Left: no tenderness of the superior pole patella, no tenderness of the inferior pole patella, no tenderness of the tibial tubercle, no tenderness of the medial joint line, no tenderness of the lateral joint line, no tenderness of the medial tibial plateau, no tenderness of Gerdy's tubercle, no tenderness of the neck of fibula Soft Tissue  Palpation Left: no tenderness of the quadriceps tendon, no tenderness of the prepatellar bursa, no tenderness of the patellar tendon, no tenderness of the medial collateral ligament, no tenderness of the infrapatellar tendon Active Range of Motion Left: normal Stability Left: no laxity, no ligamentous instability, anterior drawer sign negative, Lachman test negative Special Tests Left: McMurray's test negative Strength Left: flexion 5/5, extension 5/5, no hamstring weakness, no quadriceps weakness  Skin Left Lower Extremity: normal  Neurologic Ankle Reflex Left: normal (2) Knee Reflex Left: normal (2) Sensation on the Left: L2 normal, L3 normal, L4 normal, L5 normal, S1 normal  Psychiatric Mood and Affect: active and alert, normal mood Tender medial joint line left knee patellofemoral pain compression. Trace effusion right knee no effusion negative anterior drawer has patellofemoral crepitus.  Neurological: She is alert and oriented to person, place, and time.     Assessment/Plan Patient symptomatically asked arthritis of the left knee. Her x-rays AP and lateral demonstrates medial joint space narrowing patellofemoral arthrosis.  She tolerated the injection well and her left knee. 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 Neutral agreed to proceed with a repeat lysis of adhesion.  Discussed rest and benefits including bleeding infection no change in symptoms or worsening symptoms DVT PE anesthetic complications etc.  She asked for a pain medicine. She is allergic to Tylenol. I gave her OxyIR codon to be taken occasionally now and perioperatively.  Plan right knee EUA, arthroscopy, lysis of adhesions  Cecilie Kicks., PA-C  For Dr. Tonita Cong 09/20/2017, 11:30 AM

## 2017-09-21 NOTE — Patient Instructions (Addendum)
Carol [name]  09/21/2017   Your procedure is scheduled on: 10-04-17  Report to [facility] Main  Entrance Report to admitting at 6:30 AM   Call this number if you have problems the morning of surgery [phone]   Remember: Do not eat food or drink liquids :After Midnight.     Take these medicines the morning of surgery with A SIP OF WATER: Duloxetine (Cymbalta), and Levothyroxine (Synthroid)                                You may not have any metal on your body including hair pins and              piercings  Do not wear jewelry, make-up, lotions, powders or perfumes, deodorant             Do not wear nail polish.  Do not shave  48 hours prior to surgery.               Do not bring valuables to the hospital. [place].  Contacts, dentures or bridgework may not be worn into surgery.      Patients discharged the day of surgery will not be allowed to drive home.  Name and phone number of your driver:Angela Uzelac [phone]               Please read over the following fact sheets you were given: _____________________________________________________________________  How to Manage Your Diabetes Before and After Surgery  Why is it important to control my blood sugar before and after surgery? . Improving blood sugar levels before and after surgery helps healing and can limit problems. . A way of improving blood sugar control is eating a healthy diet by: o  Eating less sugar and carbohydrates o  Increasing activity/exercise o  Talking with your doctor about reaching your blood sugar goals . High blood sugars (greater than 180 mg/dL) can raise your risk of infections and slow your recovery, so you will need to focus on controlling your diabetes during the weeks before surgery. . Make sure that the doctor who takes care of your diabetes knows about your planned surgery including the date and location.  How do I  manage my blood sugar before surgery? . Check your blood sugar at least 4 times a day, starting 2 days before surgery, to make sure that the level is not too high or low. o Check your blood sugar the morning of your surgery when you wake up and every 2 hours until you get to the Short Stay unit. . If your blood sugar is less than 70 mg/dL, you will need to treat for low blood sugar: o Do not take insulin. o Treat a low blood sugar (less than 70 mg/dL) with  cup of clear juice (cranberry or apple), 4 glucose tablets, OR glucose gel. o Recheck blood sugar in 15 minutes after treatment (to make sure it is greater than 70 mg/dL). If your blood sugar is not greater than 70 mg/dL on recheck, call [phone] for further instructions. . Report your blood sugar to the short stay nurse when you get to Short Stay.  . If you are admitted to the hospital after surgery: o Your  blood sugar will be checked by the staff and you will probably be given insulin after surgery (instead of oral diabetes medicines) to make sure you have good blood sugar levels. o The goal for blood sugar control after surgery is 80-180 mg/dL.   WHAT DO I DO ABOUT MY DIABETES MEDICATION?  Marland Kitchen Do not take oral diabetes medicines (pills) the morning of surgery.  . THE DAY BEFORE SURGERY, take your usual Metformin.        Reviewed and Endorsed by [facility] Patient Education Committee, August 2015           [facility] - Preparing for Surgery Before surgery, you can play an important role.  Because skin is not sterile, your skin needs to be as free of germs as possible.  You can reduce the number of germs on your skin by washing with CHG (chlorahexidine gluconate) soap before surgery.  CHG is an antiseptic cleaner which kills germs and bonds with the skin to continue killing germs even after washing. Please DO NOT use if you have an allergy to CHG or antibacterial soaps.  If your skin becomes reddened/irritated stop using the CHG  and inform your nurse when you arrive at Short Stay. Do not shave (including legs and underarms) for at least 48 hours prior to the first CHG shower.  You may shave your face/neck. Please follow these instructions carefully:  1.  Shower with CHG Soap the night before surgery and the  morning of Surgery.  2.  If you choose to wash your hair, wash your hair first as usual with your  normal  shampoo.  3.  After you shampoo, rinse your hair and body thoroughly to remove the  shampoo.                           4.  Use CHG as you would any other liquid soap.  You can apply chg directly  to the skin and wash                       Gently with a scrungie or clean washcloth.  5.  Apply the CHG Soap to your body ONLY FROM THE NECK DOWN.   Do not use on face/ open                           Wound or open sores. Avoid contact with eyes, ears mouth and genitals (private parts).                       Wash face,  Genitals (private parts) with your normal soap.             6.  Wash thoroughly, paying special attention to the area where your surgery  will be performed.  7.  Thoroughly rinse your body with warm water from the neck down.  8.  DO NOT shower/wash with your normal soap after using and rinsing off  the CHG Soap.                9.  Pat yourself dry with a clean towel.            10.  Wear clean pajamas.            11.  Place clean sheets on your bed the night of your first shower and do not  sleep with pets. Day of Surgery : Do not apply any lotions/deodorants the morning of surgery.  Please wear clean clothes to the hospital/surgery center.  FAILURE TO FOLLOW THESE INSTRUCTIONS MAY RESULT IN THE CANCELLATION OF YOUR SURGERY PATIENT SIGNATURE_________________________________  NURSE SIGNATURE__________________________________  ________________________________________________________________________   [name]  An incentive spirometer is a tool that can help keep your lungs clear and  active. This tool measures how well you are filling your lungs with each breath. Taking long deep breaths may help reverse or decrease the chance of developing breathing (pulmonary) problems (especially infection) following:  A long period of time when you are unable to move or be active. BEFORE THE PROCEDURE   If the spirometer includes an indicator to show your best effort, your nurse or respiratory therapist will set it to a desired goal.  If possible, sit up straight or lean slightly forward. Try not to slouch.  Hold the incentive spirometer in an upright position. INSTRUCTIONS FOR USE  1. Sit on the edge of your bed if possible, or sit up as far as you can in bed or on a chair. 2. Hold the incentive spirometer in an upright position. 3. Breathe out normally. 4. Place the mouthpiece in your mouth and seal your lips tightly around it. 5. Breathe in slowly and as deeply as possible, raising the piston or the ball toward the top of the column. 6. Hold your breath for 3-5 seconds or for as long as possible. Allow the piston or ball to fall to the bottom of the column. 7. Remove the mouthpiece from your mouth and breathe out normally. 8. Rest for a few seconds and repeat Steps 1 through 7 at least 10 times every 1-2 hours when you are awake. Take your time and take a few normal breaths between deep breaths. 9. The spirometer may include an indicator to show your best effort. Use the indicator as a goal to work toward during each repetition. 10. After each set of 10 deep breaths, practice coughing to be sure your lungs are clear. If you have an incision (the cut made at the time of surgery), support your incision when coughing by placing a pillow or rolled up towels firmly against it. Once you are able to get out of bed, walk around indoors and cough well. You may stop using the incentive spirometer when instructed by your caregiver.  RISKS AND COMPLICATIONS  Take your time so you do not get  dizzy or light-headed.  If you are in pain, you may need to take or ask for pain medication before doing incentive spirometry. It is harder to take a deep breath if you are having pain. AFTER USE  Rest and breathe slowly and easily.  It can be helpful to keep track of a log of your progress. Your caregiver can provide you with a simple table to help with this. If you are using the spirometer at home, follow these instructions: [employer] IF:   You are having difficultly using the spirometer.  You have trouble using the spirometer as often as instructed.  Your pain medication is not giving enough relief while using the spirometer.  You develop fever of 100.5 F (38.1 C) or higher. SEEK IMMEDIATE MEDICAL CARE IF:   You cough up bloody sputum that had not been present before.  You develop fever of 102 F (38.9 C) or greater.  You develop worsening pain at or near the incision site. MAKE SURE YOU:  Understand these instructions.  Will watch your condition.  Will get help right away if you are not doing well or get worse. Document Released: 11/21/2006 Document Revised: 10/03/2011 Document Reviewed: 01/22/2007 [facility] Patient Information 2014 [place], [place].   ________________________________________________________________________

## 2017-09-27 ENCOUNTER — Encounter (HOSPITAL_COMMUNITY)
Admission: RE | Admit: 2017-09-27 | Discharge: 2017-09-27 | Disposition: A | Discharge status: Home / Self Care | Payer: PPO | Source: Ambulatory Visit | Attending: Specialist | Admitting: Specialist

## 2017-09-27 ENCOUNTER — Other Ambulatory Visit: Payer: Self-pay

## 2017-09-27 ENCOUNTER — Encounter (HOSPITAL_COMMUNITY): Payer: Self-pay

## 2017-09-27 DIAGNOSIS — Z01810 Encounter for preprocedural cardiovascular examination: Secondary | ICD-10-CM | POA: Insufficient documentation

## 2017-09-27 DIAGNOSIS — Z01812 Encounter for preprocedural laboratory examination: Secondary | ICD-10-CM | POA: Diagnosis not present

## 2017-09-27 LAB — CBC
HCT: 42.8 % (ref 36.0–46.0)
Hemoglobin: 13.7 g/dL (ref 12.0–15.0)
MCH: 29.6 pg (ref 26.0–34.0)
MCHC: 32 g/dL (ref 30.0–36.0)
MCV: 92.4 fL (ref 78.0–100.0)
Platelets: 347 10*3/uL (ref 150–400)
RBC: 4.63 MIL/uL (ref 3.87–5.11)
RDW: 13.5 % (ref 11.5–15.5)
WBC: 12.5 10*3/uL — ABNORMAL HIGH (ref 4.0–10.5)

## 2017-09-27 LAB — BASIC METABOLIC PANEL
Anion gap: 12 (ref 5–15)
BUN: 15 mg/dL (ref 6–20)
CO2: 24 mmol/L (ref 22–32)
Calcium: 9.4 mg/dL (ref 8.9–10.3)
Chloride: 100 mmol/L — ABNORMAL LOW (ref 101–111)
Creatinine, Ser: 0.78 mg/dL (ref 0.44–1.00)
GFR calc Af Amer: >60 mL/min (ref >60)
GFR calc non Af Amer: >60 mL/min (ref >60)
Glucose, Bld: 80 mg/dL (ref 65–99)
Potassium: 4.1 mmol/L (ref 3.5–5.1)
Sodium: 136 mmol/L (ref 135–145)

## 2017-09-27 LAB — HEMOGLOBIN A1C
Hgb A1c MFr Bld: 5.9 % — ABNORMAL HIGH (ref 4.8–5.6)
Mean Plasma Glucose: 122.63 mg/dL

## 2017-09-27 LAB — GLUCOSE, CAPILLARY: Glucose-Capillary: 77 mg/dL (ref 65–99)

## 2017-10-03 NOTE — Anesthesia Preprocedure Evaluation (Addendum)
Anesthesia Evaluation  Patient identified by MRN, date of birth, ID band Patient awake    Reviewed: Allergy & Precautions, H&P , NPO status , Patient's Chart, lab work & pertinent test results, reviewed documented beta blocker date and time   History of Anesthesia Complications (+) PONV  Airway Mallampati: II  TM Distance: >3 FB Neck ROM: Full    Dental no notable dental hx. (+) Teeth Intact, Dental Advisory Given   Pulmonary asthma ,    Pulmonary exam normal breath sounds clear to auscultation       Cardiovascular Exercise Tolerance: Good negative cardio ROS Normal cardiovascular exam Rhythm:Regular Rate:Normal     Neuro/Psych PSYCHIATRIC DISORDERS Anxiety Depression negative neurological ROS  negative psych ROS   GI/Hepatic negative GI ROS, Neg liver ROS,   Endo/Other  negative endocrine ROSHypothyroidism Morbid obesity  Renal/GU negative Renal ROS  negative genitourinary   Musculoskeletal  (+) Arthritis , Osteoarthritis,    Abdominal (+) + obese,   Peds negative pediatric ROS (+)  Hematology polycythemia   Anesthesia Other Findings GA w/ LMA for previous knee surg. Will need 200 of Propofol today   Reproductive/Obstetrics negative OB ROS                             Anesthesia Physical  Anesthesia Plan  ASA: III  Anesthesia Plan: General   Post-op Pain Management:    Induction: Intravenous  PONV Risk Score and Plan:   Airway Management Planned: LMA  Additional Equipment:   Intra-op Plan:   Post-operative Plan:   Informed Consent: I have reviewed the patients History and Physical, chart, labs and discussed the procedure including the risks, benefits and alternatives for the proposed anesthesia with the patient or authorized representative who has indicated his/her understanding and acceptance.   Dental Advisory Given  Plan Discussed with: CRNA  Anesthesia Plan  Comments:         Anesthesia Quick Evaluation

## 2017-10-04 ENCOUNTER — Ambulatory Visit (HOSPITAL_COMMUNITY)
Admission: RE | Admit: 2017-10-04 | Discharge: 2017-10-04 | Disposition: A | Discharge status: Home / Self Care | Payer: PPO | Source: Ambulatory Visit | Attending: Specialist | Admitting: Specialist

## 2017-10-04 ENCOUNTER — Ambulatory Visit (HOSPITAL_COMMUNITY): Payer: PPO | Admitting: Anesthesiology

## 2017-10-04 ENCOUNTER — Encounter (HOSPITAL_COMMUNITY): Payer: Self-pay | Admitting: General Practice

## 2017-10-04 ENCOUNTER — Encounter (HOSPITAL_COMMUNITY)
Admission: RE | Disposition: A | Discharge status: Home / Self Care | Payer: Self-pay | Source: Ambulatory Visit | Attending: Specialist

## 2017-10-04 DIAGNOSIS — Z79890 Hormone replacement therapy (postmenopausal): Secondary | ICD-10-CM | POA: Diagnosis not present

## 2017-10-04 DIAGNOSIS — Z885 Allergy status to narcotic agent status: Secondary | ICD-10-CM | POA: Diagnosis not present

## 2017-10-04 DIAGNOSIS — M24661 Ankylosis, right knee: Secondary | ICD-10-CM | POA: Insufficient documentation

## 2017-10-04 DIAGNOSIS — Z886 Allergy status to analgesic agent status: Secondary | ICD-10-CM | POA: Insufficient documentation

## 2017-10-04 DIAGNOSIS — M1711 Unilateral primary osteoarthritis, right knee: Secondary | ICD-10-CM | POA: Diagnosis not present

## 2017-10-04 DIAGNOSIS — E039 Hypothyroidism, unspecified: Secondary | ICD-10-CM | POA: Insufficient documentation

## 2017-10-04 DIAGNOSIS — Z79899 Other long term (current) drug therapy: Secondary | ICD-10-CM | POA: Insufficient documentation

## 2017-10-04 DIAGNOSIS — Z86718 Personal history of other venous thrombosis and embolism: Secondary | ICD-10-CM | POA: Diagnosis not present

## 2017-10-04 DIAGNOSIS — Z7984 Long term (current) use of oral hypoglycemic drugs: Secondary | ICD-10-CM | POA: Diagnosis not present

## 2017-10-04 DIAGNOSIS — J45909 Unspecified asthma, uncomplicated: Secondary | ICD-10-CM | POA: Diagnosis not present

## 2017-10-04 DIAGNOSIS — K5752 Diverticulitis of both small and large intestine without perforation or abscess without bleeding: Secondary | ICD-10-CM | POA: Diagnosis not present

## 2017-10-04 DIAGNOSIS — Z96651 Presence of right artificial knee joint: Secondary | ICD-10-CM | POA: Diagnosis not present

## 2017-10-04 DIAGNOSIS — Z881 Allergy status to other antibiotic agents status: Secondary | ICD-10-CM | POA: Diagnosis not present

## 2017-10-04 DIAGNOSIS — Z888 Allergy status to other drugs, medicaments and biological substances status: Secondary | ICD-10-CM | POA: Insufficient documentation

## 2017-10-04 DIAGNOSIS — M1990 Unspecified osteoarthritis, unspecified site: Secondary | ICD-10-CM | POA: Diagnosis not present

## 2017-10-04 HISTORY — PX: KNEE ARTHROSCOPY: SHX127

## 2017-10-04 LAB — GLUCOSE, CAPILLARY
Glucose-Capillary: 125 mg/dL — ABNORMAL HIGH (ref 65–99)
Glucose-Capillary: 210 mg/dL — ABNORMAL HIGH (ref 65–99)

## 2017-10-04 SURGERY — ARTHROSCOPY, KNEE
Anesthesia: General | Site: Knee | Laterality: Right

## 2017-10-04 MED ORDER — CEFAZOLIN SODIUM-DEXTROSE 2-4 GM/100ML-% IV SOLN
2.0000 g | INTRAVENOUS | Status: AC
  Administered 2017-10-04: 2 g via INTRAVENOUS
  Filled 2017-10-04: qty 100

## 2017-10-04 MED ORDER — FENTANYL CITRATE (PF) 100 MCG/2ML IJ SOLN
INTRAMUSCULAR | Status: DC | PRN
  Administered 2017-10-04 (×3): 25 ug via INTRAVENOUS
  Administered 2017-10-04 (×2): 50 ug via INTRAVENOUS

## 2017-10-04 MED ORDER — DEXAMETHASONE SODIUM PHOSPHATE 10 MG/ML IJ SOLN
INTRAMUSCULAR | Status: DC | PRN
  Administered 2017-10-04: 10 mg via INTRAVENOUS

## 2017-10-04 MED ORDER — ONDANSETRON HCL 4 MG/2ML IJ SOLN
INTRAMUSCULAR | Status: DC | PRN
  Administered 2017-10-04: 4 mg via INTRAVENOUS

## 2017-10-04 MED ORDER — OXYCODONE HCL 5 MG PO TABS
5.0000 mg | ORAL_TABLET | Freq: Once | ORAL | Status: AC
  Administered 2017-10-04: 5 mg via ORAL

## 2017-10-04 MED ORDER — SCOPOLAMINE 1 MG/3DAYS TD PT72
MEDICATED_PATCH | TRANSDERMAL | Status: AC
  Filled 2017-10-04: qty 1

## 2017-10-04 MED ORDER — MIDAZOLAM HCL 5 MG/5ML IJ SOLN
INTRAMUSCULAR | Status: DC | PRN
  Administered 2017-10-04: 2 mg via INTRAVENOUS

## 2017-10-04 MED ORDER — FENTANYL CITRATE (PF) 100 MCG/2ML IJ SOLN
INTRAMUSCULAR | Status: AC
  Filled 2017-10-04: qty 2

## 2017-10-04 MED ORDER — PROPOFOL 10 MG/ML IV BOLUS
INTRAVENOUS | Status: AC
  Filled 2017-10-04: qty 20

## 2017-10-04 MED ORDER — ASCRIPTIN 325 MG PO TABS: 1.0000 | ORAL_TABLET | Freq: Every day | ORAL | 0 refills | Status: AC

## 2017-10-04 MED ORDER — EPINEPHRINE PF 1 MG/ML IJ SOLN
INTRAMUSCULAR | Status: DC | PRN
  Administered 2017-10-04: 2 mg

## 2017-10-04 MED ORDER — CEPHALEXIN 500 MG PO CAPS: 500.0000 mg | ORAL_CAPSULE | Freq: Three times a day (TID) | ORAL | 1 refills | Status: AC

## 2017-10-04 MED ORDER — LACTATED RINGERS IR SOLN
Status: DC | PRN
  Administered 2017-10-04: 6000 mL

## 2017-10-04 MED ORDER — EPINEPHRINE PF 1 MG/ML IJ SOLN
INTRAMUSCULAR | Status: AC
  Filled 2017-10-04: qty 2

## 2017-10-04 MED ORDER — LACTATED RINGERS IV SOLN
INTRAVENOUS | Status: DC
  Administered 2017-10-04: 07:00:00 via INTRAVENOUS

## 2017-10-04 MED ORDER — ONDANSETRON HCL 4 MG/2ML IJ SOLN
INTRAMUSCULAR | Status: AC
  Filled 2017-10-04: qty 2

## 2017-10-04 MED ORDER — FENTANYL CITRATE (PF) 100 MCG/2ML IJ SOLN: 25.0000 ug | INTRAMUSCULAR | Status: DC | PRN

## 2017-10-04 MED ORDER — BUPIVACAINE-EPINEPHRINE 0.5% -1:200000 IJ SOLN
INTRAMUSCULAR | Status: DC | PRN
  Administered 2017-10-04: 20 mL

## 2017-10-04 MED ORDER — PROPOFOL 10 MG/ML IV BOLUS
INTRAVENOUS | Status: DC | PRN
  Administered 2017-10-04: 200 mg via INTRAVENOUS

## 2017-10-04 MED ORDER — SCOPOLAMINE 1 MG/3DAYS TD PT72
MEDICATED_PATCH | TRANSDERMAL | Status: DC | PRN
  Administered 2017-10-04: 1 via TRANSDERMAL

## 2017-10-04 MED ORDER — MIDAZOLAM HCL 2 MG/2ML IJ SOLN
INTRAMUSCULAR | Status: AC
  Filled 2017-10-04: qty 2

## 2017-10-04 MED ORDER — OXYCODONE HCL 5 MG PO TABS
ORAL_TABLET | ORAL | Status: AC
  Filled 2017-10-04: qty 1

## 2017-10-04 MED ORDER — LIDOCAINE HCL (CARDIAC) 20 MG/ML IV SOLN
INTRAVENOUS | Status: DC | PRN
  Administered 2017-10-04: 100 mg via INTRAVENOUS

## 2017-10-04 MED ORDER — BUPIVACAINE-EPINEPHRINE (PF) 0.5% -1:200000 IJ SOLN
INTRAMUSCULAR | Status: AC
  Filled 2017-10-04: qty 30

## 2017-10-04 MED ORDER — CHLORHEXIDINE GLUCONATE 4 % EX LIQD: 60.0000 mL | Freq: Once | CUTANEOUS | Status: DC

## 2017-10-04 MED ORDER — OXYCODONE HCL 5 MG PO TABS: 5.0000 mg | ORAL_TABLET | ORAL | 0 refills | Status: DC | PRN

## 2017-10-04 SURGICAL SUPPLY — 28 items
BANDAGE ACE 6X5 VEL STRL LF (GAUZE/BANDAGES/DRESSINGS) ×2 IMPLANT
BLADE 4.2CUDA (BLADE) IMPLANT
BLADE CUDA SHAVER 3.5 (BLADE) ×2 IMPLANT
BOOTIES KNEE HIGH SLOAN (MISCELLANEOUS) ×2 IMPLANT
CANNULA ACUFO 5X76 (CANNULA) ×2 IMPLANT
CLOTH 2% CHLOROHEXIDINE 3PK (PERSONAL CARE ITEMS) ×2 IMPLANT
COVER SURGICAL LIGHT HANDLE (MISCELLANEOUS) ×2 IMPLANT
DRSG EMULSION OIL 3X3 NADH (GAUZE/BANDAGES/DRESSINGS) ×2 IMPLANT
DRSG PAD ABDOMINAL 8X10 ST (GAUZE/BANDAGES/DRESSINGS) ×2 IMPLANT
DURAPREP 26ML APPLICATOR (WOUND CARE) ×2 IMPLANT
GAUZE SPONGE 4X4 12PLY STRL (GAUZE/BANDAGES/DRESSINGS) ×2 IMPLANT
GLOVE BIOGEL PI IND STRL 7.0 (GLOVE) IMPLANT
GLOVE BIOGEL PI INDICATOR 7.0 (GLOVE)
GLOVE SURG SS PI 7.0 STRL IVOR (GLOVE) ×2 IMPLANT
GLOVE SURG SS PI 7.5 STRL IVOR (GLOVE) IMPLANT
GLOVE SURG SS PI 8.0 STRL IVOR (GLOVE) ×2 IMPLANT
GOWN STRL REUS W/TWL XL LVL3 (GOWN DISPOSABLE) ×2 IMPLANT
KIT BASIN OR (CUSTOM PROCEDURE TRAY) ×2 IMPLANT
MANIFOLD NEPTUNE II (INSTRUMENTS) ×2 IMPLANT
PACK ARTHROSCOPY WL (CUSTOM PROCEDURE TRAY) ×2 IMPLANT
PAD ABD 8X10 STRL (GAUZE/BANDAGES/DRESSINGS) ×2 IMPLANT
PADDING CAST COTTON 6X4 STRL (CAST SUPPLIES) ×2 IMPLANT
SUT ETHILON 4 0 PS 2 18 (SUTURE) ×2 IMPLANT
TOWEL OR 17X26 10 PK STRL BLUE (TOWEL DISPOSABLE) ×2 IMPLANT
TUBING ARTHRO INFLOW-ONLY STRL (TUBING) ×2 IMPLANT
WAND HAND CNTRL MULTIVAC 50 (MISCELLANEOUS) ×2 IMPLANT
WAND HAND CNTRL MULTIVAC 90 (MISCELLANEOUS) IMPLANT
WRAP KNEE MAXI GEL POST OP (GAUZE/BANDAGES/DRESSINGS) ×2 IMPLANT

## 2017-10-04 NOTE — Interval H&P Note (Signed)
History and Physical Interval Note:  10/04/2017 8:33 AM  Carol [name]  has presented today for surgery, with the diagnosis of Arthrofibrosis right knee  The various methods of treatment have been discussed with the patient and family. After consideration of risks, benefits and other options for treatment, the patient has consented to  Procedure(s) with comments: Right knee arthroscopy, evaluation under anesthesia, lysis of adhesions (Right) - 60 mins as a surgical intervention .  The patient's history has been reviewed, patient examined, no change in status, stable for surgery.  I have reviewed the patient's chart and labs.  Questions were answered to the patient's satisfaction.     BEANE,JEFFREY C

## 2017-10-04 NOTE — Anesthesia Postprocedure Evaluation (Signed)
Anesthesia Post Note  Patient: UNIQUE SILLAS  Procedure(s) Performed: Right knee arthroscopy, evaluation under anesthesia, lysis of adhesions (Right Knee)     Patient location during evaluation: PACU Anesthesia Type: General Level of consciousness: awake and alert Pain management: pain level controlled Vital Signs Assessment: post-procedure vital signs reviewed and stable Respiratory status: spontaneous breathing, nonlabored ventilation, respiratory function stable and patient connected to nasal cannula oxygen Cardiovascular status: blood pressure returned to baseline and stable Postop Assessment: no apparent nausea or vomiting Anesthetic complications: no    Last Vitals:  Vitals:   10/04/17 1030 10/04/17 1110  BP: 121/70 138/81  Pulse: 81 80  Resp: 20 18  Temp: 37.1 C 36.9 C  SpO2: 95% 91%    Last Pain:  Vitals:   10/04/17 1110  TempSrc: Oral  PainSc:                  Riccardo Dubin

## 2017-10-04 NOTE — Brief Op Note (Signed)
10/04/2017  9:27 AM  PATIENT:  Carol [name]  62 y.o. female  PRE-OPERATIVE DIAGNOSIS:  Arthrofibrosis right knee  POST-OPERATIVE DIAGNOSIS:  Arthrofibrosis right knee  PROCEDURE:  Procedure(s) with comments: Right knee arthroscopy, evaluation under anesthesia, lysis of adhesions (Right) - 60 mins  SURGEON:  Surgeon(s) and Role:    Susa Day, MD - Primary  PHYSICIAN ASSISTANT:   ASSISTANTS: Bissell   ANESTHESIA:   general  EBL:  none  BLOOD ADMINISTERED:none  DRAINS: none  LOCAL MEDICATIONS USED:  MARCAINE     SPECIMEN:  No Specimen  DISPOSITION OF SPECIMEN:  N/A  COUNTS:  YES  TOURNIQUET:  * No tourniquets in log *  DICTATION: .Other Dictation: Dictation Number [id number]  PLAN OF CARE: Discharge to home after PACU  PATIENT DISPOSITION:  PACU - hemodynamically stable.   Delay start of Pharmacological VTE agent (>24hrs) due to surgical blood loss or risk of bleeding: no

## 2017-10-04 NOTE — Anesthesia Procedure Notes (Signed)
Procedure Name: LMA Insertion Date/Time: 10/04/2017 8:44 AM Performed by: Glory Buff, CRNA Pre-anesthesia Checklist: Patient identified, Emergency Drugs available, Suction available and Patient being monitored Patient Re-evaluated:Patient Re-evaluated prior to induction Oxygen Delivery Method: Circle system utilized Preoxygenation: Pre-oxygenation with 100% oxygen Induction Type: IV induction LMA: LMA inserted LMA Size: 4.0 Number of attempts: 1 Placement Confirmation: positive ETCO2 Tube secured with: Tape Dental Injury: Teeth and Oropharynx as per pre-operative assessment

## 2017-10-04 NOTE — Transfer of Care (Signed)
Immediate Anesthesia Transfer of Care Note  Patient: Carol [name]  Procedure(s) Performed: Right knee arthroscopy, evaluation under anesthesia, lysis of adhesions (Right Knee)  Patient Location: PACU  Anesthesia Type:General  Level of Consciousness: awake, alert  and oriented  Airway & Oxygen Therapy: Patient Spontanous Breathing and Patient connected to face mask oxygen  Post-op Assessment: Report given to RN and Post -op Vital signs reviewed and stable  Post vital signs: Reviewed and stable  Last Vitals:  Vitals:   10/04/17 0702  BP: (!) 146/57  Pulse: 79  Resp: 18  Temp: 36.9 C  SpO2: 96%    Last Pain:  Vitals:   10/04/17 0702  TempSrc: Oral  PainSc: 10-Worst pain ever         Complications: No apparent anesthesia complications

## 2017-10-04 NOTE — Discharge Instructions (Signed)
ARTHROSCOPIC KNEE SURGERY HOME CARE INSTRUCTIONS   PAIN You will be expected to have a moderate amount of pain in the affected knee for approximately two weeks.  However, the first two to four days will be the most severe in terms of the pain you will experience.  Prescriptions have been provided for you to take as needed for the pain.  The pain can be markedly reduced by using the ice/compressive bandage given.  Exchange the ice packs whenever they thaw.  During the night, keep the bandage on because it will still provide some compression for the swelling.  Also, keep the leg elevated on pillows above your heart, and this will help alleviate the pain and swelling.  MEDICATION Prescriptions have been provided to take as needed for pain. To prevent blood clots, take Aspirin 325mg daily with a meal if not on a blood thinner and if no history of stomach ulcers.  ACTIVITY It is preferred that you stay on bedrest for approximately 24 hours.  However, you may go to the bathroom with help.  After this, you can start to be up and about progressively more.  Remember that the swelling may still increase after three to four days if you are up and doing too much.  You may put as much weight on the affected leg as pain will allow.  Use your crutches for comfort and safety.  However, as soon as you are able, you may discard the crutches and go without them.   DRESSING Keep the current dressing as dry as possible.  Two days after your surgery, you may remove the ice/compressive wrap, and surgical dressing.  You may now take a shower, but do not scrub the sounds directly with soap.  Let water rinse over these and gently wipe with your hand.  Reapply band-aids over the puncture wounds and more gauze if needed.  A slight amount of thin drainage can be normal at this time, and do not let it frighten you.  Reapply the ice/compressive wrap.  You may now repeat this every day each time you shower.  SYMPTOMS TO REPORT TO  YOUR DOCTOR  -Extreme pain.  -Extreme swelling.  -Temperature above 101 degrees that does not come down with acetaminophen     (Tylenol).  -Any changes in the feeling, color or movement of your toes.  -Extreme redness, heat, swelling or drainage at your incision  EXERCISE It is preferred that you begin to exercise on the day of your surgery.  Straight leg raises and short arc quads should be begun the afternoon or evening of surgery and continued until you come back for your follow-up appointment.   Attached is an instruction sheet on how to perform these two simple exercises.  Do these at least three times per day if not more.  You may bend your knee as much as is comfortable.  The puncture wounds may occasionally be slightly uncomfortable with bending of the knee.  Do not let this frighten you.  It is important to keep your knee motion, but do not overdo it.  If you have significant pain, simply do not bend the knee as far.   You will be given more exercises to perform at your first return visit.    RETURN APPOINTMENT Please make an appointment to be seen by your doctor in 10-14 days from your surgery.  Patient Signature:  ________________________________________________________  Nurse's Signature:  ________________________________________________________ 

## 2017-10-05 NOTE — Op Note (Signed)
NAMELYLLIAN, GAUSE NO.:  [id number]  MEDICAL RECORD NO.:  [id number]  LOCATION:                                 FACILITY:  PHYSICIAN:  Susa Day, M.D.    DATE OF BIRTH:  08-03-1955  DATE OF PROCEDURE:  10/04/2017 DATE OF DISCHARGE:  10/04/2017                              OPERATIVE REPORT   PREOPERATIVE DIAGNOSIS:  Arthrofibrosis and cyclops lesion of a right total knee replacement.  POSTOPERATIVE DIAGNOSIS:  Arthrofibrosis and cyclops lesion of a right total knee replacement.  PROCEDURES PERFORMED: 1. Exam under anesthesia. 2. Right knee arthroscopy, debridement of arthrofibrosis and cyclops     lesion.  ANESTHESIA:  General.  ASSISTANT:  Lacie Draft, PA.  HISTORY:  A 62 year old female with history of total knee replacement in the past.  She developed scar tissue and arthrofibrosis, crepitus and range of motion issues with pain, refractory to conservative treatment. She was indicated and has had this in the past and has done well with this in terms of exam under anesthesia and lysis of adhesions.  Risks and benefits discussed including bleeding, infection, no change in symptoms, worsening symptoms, DVT, PE, anesthetic complications, etc.  TECHNIQUE:  With the patient in supine position after induction of adequate general anesthesia, 2 g of Kefzol, right lower extremity was prepped and draped in usual sterile fashion.  After time-out, identified the extremity.  Marcaine 0.25% with epinephrine was infiltrated in the previous arthroscopic portals.  Three incisions were made through the skin.  The knee in a slightly bent position.  Lateral parapatellar portal, medial parapatellar portal and superomedial parapatellar portal. I used a flexible plastic cannula and inserted into the lateral portal and then the shaver introduced.  We also carefully with the knee in extended position, inserted the arthroscopic cannula laterally. Irrigant was  utilized to insufflate the joint, 65 mmHg.  Inspection revealed in the suprapatellar region from the patella region superomedially of arthrofibrosis.  Prior to that, in flexion and extension, appeared to be some crepitus with flexion-extension laterally.  Through the superomedial portal and through the medial portal, we used an ArthroWand, introduced it and carefully avoided the abrasion of the implant.  Used a combination of 3.5 shaver and an ArthroWand to debride and to remove the cyclops lesion and the arthrofibrosis.  We then cauterized it with the electrocautery, bringing the arthroscopic pressure down to 0 without active bleeding following that.  Reinflated.  After this, this was an area of about 2 x 2 cm. Following that, flexion-extension revealed no impingement on the condyle.  We examined the rest of the joint, the intercondylar notch did have some arthrofibrosis as well.  This was shaved and debrided.  The implant contact surfaces medially and laterally were unremarkable and without evidence of fragmentation or intervening soft tissue.  Some scar tissue in the lateral gutter as well, and this was shaved and cauterized, and following this, again, no bleeding was noted.  Flexion- extension examination following that revealed no clinical evidence of impingement or crepitus.  Initially with insertion of the arthroscopic cannula, there was no fluid within the joint.  No evidence of loosening. Hardware appeared to  be intact and satisfactory.  After this, all instrumentation was removed.  Then, we closed all portals, watertight closure with a 4-0 nylon simple sutures.  Wound was dressed.  We flexed the knee to 90 degrees.  There was no excessive anterior drawer, there was no instability, and there was excellent patellofemoral tracking.  There was good stability, varus-valgus stressing at 0-30 degrees.  Sterile dressing applied.  She was awakened without difficulty and transported to  the recovery room in satisfactory condition.  The patient tolerated the procedure well.  No complications.  Assistant, Lacie Draft, [place].  Minimal blood loss.     Susa Day, M.D.     Geralynn Rile  D:  10/04/2017  T:  10/05/2017  Job:  [id number]

## 2017-11-06 ENCOUNTER — Encounter (HOSPITAL_COMMUNITY): Payer: Self-pay | Admitting: Specialist

## 2017-12-14 DIAGNOSIS — M5136 Other intervertebral disc degeneration, lumbar region: Secondary | ICD-10-CM | POA: Diagnosis not present

## 2017-12-14 DIAGNOSIS — M545 Low back pain: Secondary | ICD-10-CM | POA: Diagnosis not present

## 2017-12-14 DIAGNOSIS — Z6841 Body Mass Index (BMI) 40.0 and over, adult: Secondary | ICD-10-CM | POA: Diagnosis not present

## 2017-12-14 DIAGNOSIS — Z981 Arthrodesis status: Secondary | ICD-10-CM | POA: Diagnosis not present

## 2017-12-15 DIAGNOSIS — Z981 Arthrodesis status: Secondary | ICD-10-CM | POA: Insufficient documentation

## 2017-12-21 DIAGNOSIS — N390 Urinary tract infection, site not specified: Secondary | ICD-10-CM | POA: Diagnosis not present

## 2017-12-21 DIAGNOSIS — R300 Dysuria: Secondary | ICD-10-CM | POA: Diagnosis not present

## 2018-01-06 DIAGNOSIS — Z981 Arthrodesis status: Secondary | ICD-10-CM | POA: Diagnosis not present

## 2018-01-06 DIAGNOSIS — M961 Postlaminectomy syndrome, not elsewhere classified: Secondary | ICD-10-CM | POA: Diagnosis not present

## 2018-01-06 DIAGNOSIS — M545 Low back pain: Secondary | ICD-10-CM | POA: Diagnosis not present

## 2018-01-06 DIAGNOSIS — M5136 Other intervertebral disc degeneration, lumbar region: Secondary | ICD-10-CM | POA: Diagnosis not present

## 2018-01-17 DIAGNOSIS — R197 Diarrhea, unspecified: Secondary | ICD-10-CM | POA: Diagnosis not present

## 2018-01-19 DIAGNOSIS — M5416 Radiculopathy, lumbar region: Secondary | ICD-10-CM | POA: Diagnosis not present

## 2018-01-19 DIAGNOSIS — M5137 Other intervertebral disc degeneration, lumbosacral region: Secondary | ICD-10-CM | POA: Diagnosis not present

## 2018-01-19 DIAGNOSIS — M5116 Intervertebral disc disorders with radiculopathy, lumbar region: Secondary | ICD-10-CM | POA: Diagnosis not present

## 2018-01-19 DIAGNOSIS — R197 Diarrhea, unspecified: Secondary | ICD-10-CM | POA: Diagnosis not present

## 2018-02-05 DIAGNOSIS — M961 Postlaminectomy syndrome, not elsewhere classified: Secondary | ICD-10-CM | POA: Diagnosis not present

## 2018-02-05 DIAGNOSIS — M5136 Other intervertebral disc degeneration, lumbar region: Secondary | ICD-10-CM | POA: Diagnosis not present

## 2018-02-05 DIAGNOSIS — M5416 Radiculopathy, lumbar region: Secondary | ICD-10-CM | POA: Diagnosis not present

## 2018-02-06 DIAGNOSIS — R0609 Other forms of dyspnea: Secondary | ICD-10-CM | POA: Diagnosis not present

## 2018-02-06 DIAGNOSIS — K529 Noninfective gastroenteritis and colitis, unspecified: Secondary | ICD-10-CM | POA: Diagnosis not present

## 2018-02-06 DIAGNOSIS — M545 Low back pain: Secondary | ICD-10-CM | POA: Diagnosis not present

## 2018-02-06 DIAGNOSIS — R252 Cramp and spasm: Secondary | ICD-10-CM | POA: Diagnosis not present

## 2018-02-06 DIAGNOSIS — R61 Generalized hyperhidrosis: Secondary | ICD-10-CM | POA: Diagnosis not present

## 2018-02-06 DIAGNOSIS — E119 Type 2 diabetes mellitus without complications: Secondary | ICD-10-CM | POA: Diagnosis not present

## 2018-02-06 DIAGNOSIS — F3289 Other specified depressive episodes: Secondary | ICD-10-CM | POA: Diagnosis not present

## 2018-02-06 DIAGNOSIS — Z6841 Body Mass Index (BMI) 40.0 and over, adult: Secondary | ICD-10-CM | POA: Diagnosis not present

## 2018-02-06 DIAGNOSIS — E668 Other obesity: Secondary | ICD-10-CM | POA: Diagnosis not present

## 2018-02-22 DIAGNOSIS — K529 Noninfective gastroenteritis and colitis, unspecified: Secondary | ICD-10-CM | POA: Diagnosis not present

## 2018-02-22 DIAGNOSIS — R300 Dysuria: Secondary | ICD-10-CM | POA: Diagnosis not present

## 2018-02-22 DIAGNOSIS — E119 Type 2 diabetes mellitus without complications: Secondary | ICD-10-CM | POA: Diagnosis not present

## 2018-02-22 DIAGNOSIS — M5136 Other intervertebral disc degeneration, lumbar region: Secondary | ICD-10-CM | POA: Diagnosis not present

## 2018-02-22 DIAGNOSIS — Z6841 Body Mass Index (BMI) 40.0 and over, adult: Secondary | ICD-10-CM | POA: Diagnosis not present

## 2018-02-22 DIAGNOSIS — M961 Postlaminectomy syndrome, not elsewhere classified: Secondary | ICD-10-CM | POA: Diagnosis not present

## 2018-03-14 DIAGNOSIS — H6692 Otitis media, unspecified, left ear: Secondary | ICD-10-CM | POA: Diagnosis not present

## 2018-03-14 DIAGNOSIS — R61 Generalized hyperhidrosis: Secondary | ICD-10-CM | POA: Diagnosis not present

## 2018-03-14 DIAGNOSIS — Z6838 Body mass index (BMI) 38.0-38.9, adult: Secondary | ICD-10-CM | POA: Diagnosis not present

## 2018-03-14 DIAGNOSIS — E119 Type 2 diabetes mellitus without complications: Secondary | ICD-10-CM | POA: Diagnosis not present

## 2018-03-14 DIAGNOSIS — K219 Gastro-esophageal reflux disease without esophagitis: Secondary | ICD-10-CM | POA: Diagnosis not present

## 2018-03-14 DIAGNOSIS — H8112 Benign paroxysmal vertigo, left ear: Secondary | ICD-10-CM | POA: Diagnosis not present

## 2018-04-05 DIAGNOSIS — M5136 Other intervertebral disc degeneration, lumbar region: Secondary | ICD-10-CM | POA: Diagnosis not present

## 2018-04-05 DIAGNOSIS — M961 Postlaminectomy syndrome, not elsewhere classified: Secondary | ICD-10-CM | POA: Diagnosis not present

## 2018-04-09 DIAGNOSIS — E119 Type 2 diabetes mellitus without complications: Secondary | ICD-10-CM | POA: Diagnosis not present

## 2018-04-09 DIAGNOSIS — M961 Postlaminectomy syndrome, not elsewhere classified: Secondary | ICD-10-CM | POA: Diagnosis not present

## 2018-04-09 DIAGNOSIS — M5136 Other intervertebral disc degeneration, lumbar region: Secondary | ICD-10-CM | POA: Diagnosis not present

## 2018-04-09 DIAGNOSIS — M545 Low back pain: Secondary | ICD-10-CM | POA: Diagnosis not present

## 2018-04-09 DIAGNOSIS — Z981 Arthrodesis status: Secondary | ICD-10-CM | POA: Diagnosis not present

## 2018-04-19 ENCOUNTER — Encounter (INDEPENDENT_AMBULATORY_CARE_PROVIDER_SITE_OTHER): Payer: PPO

## 2018-04-20 DIAGNOSIS — M545 Low back pain: Secondary | ICD-10-CM | POA: Diagnosis not present

## 2018-04-24 ENCOUNTER — Encounter (INDEPENDENT_AMBULATORY_CARE_PROVIDER_SITE_OTHER): Payer: Self-pay

## 2018-04-24 ENCOUNTER — Ambulatory Visit (INDEPENDENT_AMBULATORY_CARE_PROVIDER_SITE_OTHER): Payer: PPO | Admitting: Family Medicine

## 2018-05-01 DIAGNOSIS — M5136 Other intervertebral disc degeneration, lumbar region: Secondary | ICD-10-CM | POA: Diagnosis not present

## 2018-05-01 DIAGNOSIS — M545 Low back pain: Secondary | ICD-10-CM | POA: Diagnosis not present

## 2018-05-01 DIAGNOSIS — Z981 Arthrodesis status: Secondary | ICD-10-CM | POA: Diagnosis not present

## 2018-05-01 DIAGNOSIS — M961 Postlaminectomy syndrome, not elsewhere classified: Secondary | ICD-10-CM | POA: Diagnosis not present

## 2018-06-01 DIAGNOSIS — H8112 Benign paroxysmal vertigo, left ear: Secondary | ICD-10-CM | POA: Diagnosis not present

## 2018-06-01 DIAGNOSIS — Z6837 Body mass index (BMI) 37.0-37.9, adult: Secondary | ICD-10-CM | POA: Diagnosis not present

## 2018-06-01 DIAGNOSIS — E119 Type 2 diabetes mellitus without complications: Secondary | ICD-10-CM | POA: Diagnosis not present

## 2018-06-01 DIAGNOSIS — E668 Other obesity: Secondary | ICD-10-CM | POA: Diagnosis not present

## 2018-06-01 DIAGNOSIS — R82998 Other abnormal findings in urine: Secondary | ICD-10-CM | POA: Diagnosis not present

## 2018-06-01 DIAGNOSIS — H6692 Otitis media, unspecified, left ear: Secondary | ICD-10-CM | POA: Diagnosis not present

## 2018-06-01 DIAGNOSIS — N390 Urinary tract infection, site not specified: Secondary | ICD-10-CM | POA: Diagnosis not present

## 2018-08-21 DIAGNOSIS — M47816 Spondylosis without myelopathy or radiculopathy, lumbar region: Secondary | ICD-10-CM | POA: Insufficient documentation

## 2018-09-07 DIAGNOSIS — R110 Nausea: Secondary | ICD-10-CM | POA: Diagnosis not present

## 2018-09-07 DIAGNOSIS — Z6836 Body mass index (BMI) 36.0-36.9, adult: Secondary | ICD-10-CM | POA: Diagnosis not present

## 2018-09-07 DIAGNOSIS — N390 Urinary tract infection, site not specified: Secondary | ICD-10-CM | POA: Diagnosis not present

## 2018-11-15 DIAGNOSIS — M25551 Pain in right hip: Secondary | ICD-10-CM | POA: Diagnosis not present

## 2018-11-26 DIAGNOSIS — M48061 Spinal stenosis, lumbar region without neurogenic claudication: Secondary | ICD-10-CM | POA: Diagnosis not present

## 2018-11-26 DIAGNOSIS — M545 Low back pain: Secondary | ICD-10-CM | POA: Diagnosis not present

## 2018-11-26 DIAGNOSIS — M5136 Other intervertebral disc degeneration, lumbar region: Secondary | ICD-10-CM | POA: Diagnosis not present

## 2018-11-26 DIAGNOSIS — M47896 Other spondylosis, lumbar region: Secondary | ICD-10-CM | POA: Diagnosis not present

## 2018-12-10 DIAGNOSIS — M25561 Pain in right knee: Secondary | ICD-10-CM | POA: Diagnosis not present

## 2018-12-10 DIAGNOSIS — Z96651 Presence of right artificial knee joint: Secondary | ICD-10-CM | POA: Diagnosis not present

## 2018-12-13 DIAGNOSIS — M5136 Other intervertebral disc degeneration, lumbar region: Secondary | ICD-10-CM | POA: Diagnosis not present

## 2018-12-13 DIAGNOSIS — Z79891 Long term (current) use of opiate analgesic: Secondary | ICD-10-CM | POA: Diagnosis not present

## 2018-12-19 DIAGNOSIS — J0100 Acute maxillary sinusitis, unspecified: Secondary | ICD-10-CM | POA: Diagnosis not present

## 2019-01-02 DIAGNOSIS — M25522 Pain in left elbow: Secondary | ICD-10-CM | POA: Diagnosis not present

## 2019-01-02 DIAGNOSIS — M7712 Lateral epicondylitis, left elbow: Secondary | ICD-10-CM | POA: Diagnosis not present

## 2019-01-08 ENCOUNTER — Other Ambulatory Visit (HOSPITAL_COMMUNITY): Payer: Self-pay | Admitting: Internal Medicine

## 2019-01-08 DIAGNOSIS — R899 Unspecified abnormal finding in specimens from other organs, systems and tissues: Secondary | ICD-10-CM

## 2019-01-18 ENCOUNTER — Telehealth (HOSPITAL_COMMUNITY): Payer: Self-pay | Admitting: *Deleted

## 2019-01-18 NOTE — Telephone Encounter (Signed)
  Left message to call back, no DPR  Carol [name]

## 2019-01-21 ENCOUNTER — Other Ambulatory Visit (HOSPITAL_COMMUNITY): Payer: PPO

## 2019-02-05 DIAGNOSIS — Z79899 Other long term (current) drug therapy: Secondary | ICD-10-CM | POA: Diagnosis not present

## 2019-02-05 DIAGNOSIS — Z5181 Encounter for therapeutic drug level monitoring: Secondary | ICD-10-CM | POA: Diagnosis not present

## 2019-04-10 ENCOUNTER — Ambulatory Visit (HOSPITAL_COMMUNITY): Payer: PPO | Attending: Cardiology

## 2019-04-10 ENCOUNTER — Other Ambulatory Visit: Payer: Self-pay

## 2019-04-10 DIAGNOSIS — R899 Unspecified abnormal finding in specimens from other organs, systems and tissues: Secondary | ICD-10-CM | POA: Diagnosis not present

## 2019-04-10 DIAGNOSIS — E039 Hypothyroidism, unspecified: Secondary | ICD-10-CM | POA: Diagnosis not present

## 2019-04-10 DIAGNOSIS — R5383 Other fatigue: Secondary | ICD-10-CM | POA: Insufficient documentation

## 2019-04-10 DIAGNOSIS — Z8701 Personal history of pneumonia (recurrent): Secondary | ICD-10-CM | POA: Diagnosis not present

## 2019-04-11 ENCOUNTER — Other Ambulatory Visit: Payer: Self-pay | Admitting: Internal Medicine

## 2019-04-11 DIAGNOSIS — J329 Chronic sinusitis, unspecified: Secondary | ICD-10-CM | POA: Diagnosis not present

## 2019-04-11 DIAGNOSIS — Z1231 Encounter for screening mammogram for malignant neoplasm of breast: Secondary | ICD-10-CM

## 2019-04-19 DIAGNOSIS — E038 Other specified hypothyroidism: Secondary | ICD-10-CM | POA: Diagnosis not present

## 2019-04-19 DIAGNOSIS — E119 Type 2 diabetes mellitus without complications: Secondary | ICD-10-CM | POA: Diagnosis not present

## 2019-04-25 DIAGNOSIS — M25522 Pain in left elbow: Secondary | ICD-10-CM | POA: Diagnosis not present

## 2019-04-25 DIAGNOSIS — M7712 Lateral epicondylitis, left elbow: Secondary | ICD-10-CM | POA: Diagnosis not present

## 2019-04-26 DIAGNOSIS — M545 Low back pain: Secondary | ICD-10-CM | POA: Diagnosis not present

## 2019-04-26 DIAGNOSIS — Z1331 Encounter for screening for depression: Secondary | ICD-10-CM | POA: Diagnosis not present

## 2019-04-26 DIAGNOSIS — R61 Generalized hyperhidrosis: Secondary | ICD-10-CM | POA: Diagnosis not present

## 2019-04-26 DIAGNOSIS — F418 Other specified anxiety disorders: Secondary | ICD-10-CM | POA: Diagnosis not present

## 2019-04-26 DIAGNOSIS — E119 Type 2 diabetes mellitus without complications: Secondary | ICD-10-CM | POA: Diagnosis not present

## 2019-04-26 DIAGNOSIS — J329 Chronic sinusitis, unspecified: Secondary | ICD-10-CM | POA: Diagnosis not present

## 2019-04-26 DIAGNOSIS — Z1339 Encounter for screening examination for other mental health and behavioral disorders: Secondary | ICD-10-CM | POA: Diagnosis not present

## 2019-04-26 DIAGNOSIS — R110 Nausea: Secondary | ICD-10-CM | POA: Diagnosis not present

## 2019-04-26 DIAGNOSIS — Z0000 Encounter for general adult medical examination without abnormal findings: Secondary | ICD-10-CM | POA: Diagnosis not present

## 2019-04-26 DIAGNOSIS — E039 Hypothyroidism, unspecified: Secondary | ICD-10-CM | POA: Diagnosis not present

## 2019-04-26 DIAGNOSIS — R0600 Dyspnea, unspecified: Secondary | ICD-10-CM | POA: Diagnosis not present

## 2019-04-26 DIAGNOSIS — R1012 Left upper quadrant pain: Secondary | ICD-10-CM | POA: Diagnosis not present

## 2019-05-10 ENCOUNTER — Ambulatory Visit: Payer: Self-pay | Admitting: Cardiology

## 2019-05-10 DIAGNOSIS — R0609 Other forms of dyspnea: Secondary | ICD-10-CM | POA: Insufficient documentation

## 2019-05-10 DIAGNOSIS — R0600 Dyspnea, unspecified: Secondary | ICD-10-CM | POA: Insufficient documentation

## 2019-05-10 NOTE — Progress Notes (Deleted)
Patient referred by Leanna Battles, MD for ***  Subjective:   Carol [name], female    DOB: 05/31/1956, 63 y.o.   MRN: [id number]  *** No chief complaint on file.   *** HPI  63 year old Caucasian female with hypothyroidism, type 2 diabetes mellitus, family h/o early CAD, history of ? blood clots, blood transfusion, polycythemia,  now with exertional dyspnea.  ***  Past Medical History:  Diagnosis Date  . Anxiety   . Arthritis   . Balance problems   . Complication of anesthesia    pt states has difficulty with being put to sleep   . Falls   . Family history of adverse reaction to anesthesia    pts daughter has N&V  . Fatigue   . Hemorrhoids   . History of blood clots   . History of blood transfusion    2006  . History of bronchitis   . History of chicken pox   . History of frequent urinary tract infections   . History of gallstones   . History of jaundice   . History of kidney stones   . History of measles as a child   . History of mumps as a child   . Hypothyroidism   . Insomnia   . Night sweats   . Pneumonia    hx of   . Polycythemia   . PONV (postoperative nausea and vomiting)   . Thyroid disease   . Tinnitus     *** Past Surgical History:  Procedure Laterality Date  . ABDOMINAL HYSTERECTOMY    . BACK SURGERY    . CHOLECYSTECTOMY    . KNEE ARTHROSCOPY Right 01/07/2016   Procedure: ARTHROSCOPY RIGHT KNEE, DEBRIDEMENT OF ARTHROFIBROSIS, AND EXAM UNDER ANESTHESIA;  Surgeon: Susa Day, MD;  Location: [facility];  Service: Orthopedics;  Laterality: Right;  . KNEE ARTHROSCOPY Right 10/04/2017   Procedure: Right knee arthroscopy, evaluation under anesthesia, lysis of adhesions;  Surgeon: Susa Day, MD;  Location: [facility];  Service: Orthopedics;  Laterality: Right;  60 mins  . KNEE SURGERY    . left foot surgery      to repair break - 2002  . menicus tear     bilat;   . TONSILLECTOMY    . TOTAL KNEE ARTHROPLASTY Right 03/27/2015   Procedure: RIGHT  TOTAL KNEE ARTHROPLASTY;  Surgeon: Susa Day, MD;  Location: [facility];  Service: Orthopedics;  Laterality: Right;    *** Social History   Socioeconomic History  . Marital status: Married    Spouse name: Not on file  . Number of children: Not on file  . Years of education: Not on file  . Highest education level: Not on file  Occupational History  . Not on file  Social Needs  . Financial resource strain: Not on file  . Food insecurity    Worry: Not on file    Inability: Not on file  . Transportation needs    Medical: Not on file    Non-medical: Not on file  Tobacco Use  . Smoking status: Never Smoker  . Smokeless tobacco: Never Used  Substance and Sexual Activity  . Alcohol use: No  . Drug use: No  . Sexual activity: Yes    Birth control/protection: None  Lifestyle  . Physical activity    Days per week: Not on file    Minutes per session: Not on file  . Stress: Not on file  Relationships  . Social connections  Talks on phone: Not on file    Gets together: Not on file    Attends religious service: Not on file    Active member of club or organization: Not on file    Attends meetings of clubs or organizations: Not on file    Relationship status: Not on file  . Intimate partner violence    Fear of current or ex partner: Not on file    Emotionally abused: Not on file    Physically abused: Not on file    Forced sexual activity: Not on file  Other Topics Concern  . Not on file  Social History Narrative  . Not on file    *** Family History  Problem Relation Age of Onset  . Hypertension Mother   . Heart attack Father 40    *** Current Outpatient Medications on File Prior to Visit  Medication Sig Dispense Refill  . DULoxetine (CYMBALTA) 30 MG capsule Take 30 mg by mouth daily.    Marland Kitchen ibuprofen (ADVIL,MOTRIN) 200 MG tablet Take 400 mg by mouth 2 (two) times daily.    . metFORMIN (GLUCOPHAGE-XR) 500 MG 24 hr tablet Take 500 mg by mouth 2 (two) times daily.    Marland Kitchen  oxyCODONE (OXY IR/ROXICODONE) 5 MG immediate release tablet Take 1 tablet (5 mg total) by mouth every 4 (four) hours as needed for severe pain. 21 tablet 0  . tiZANidine (ZANAFLEX) 4 MG capsule Take 4 mg by mouth at bedtime.     No current facility-administered medications on file prior to visit.     Cardiovascular studies:  ***  Outside echocardiogram 04/10/2019: Normal LV size.  EF 60 to 65%.  Grade 1 diastolic dysfunction. Normal RV size and function. No significant valvular abnormalities.    Recent labs: 04/19/2019: Glucose 116.  BUN/creatinine 17/0.9.  EGFR 63.  Sodium 139, potassium 4.6.  Rest of the CMP normal. H/H 15/49.  MCV 90.  Platelets 267. Cholesterol 144, triglycerides 76, HDL 40, LDL 89. Hemoglobin A1c 5.0%  ***ROS      *** There were no vitals filed for this visit.   There is no height or weight on file to calculate BMI. There were no vitals filed for this visit.  *** Objective:   Physical Exam        Assessment & Recommendations:   ***  ***   Thank you for referring the patient to [place]. Please feel free to contact with any questions.  Nigel Mormon, MD [facility] Pager: [phone] Office: [phone] If no answer Cell [phone]

## 2019-05-27 ENCOUNTER — Ambulatory Visit: Payer: PPO

## 2019-05-28 DIAGNOSIS — M1712 Unilateral primary osteoarthritis, left knee: Secondary | ICD-10-CM | POA: Diagnosis not present

## 2019-05-28 DIAGNOSIS — M25562 Pain in left knee: Secondary | ICD-10-CM | POA: Diagnosis not present

## 2019-05-28 DIAGNOSIS — M25561 Pain in right knee: Secondary | ICD-10-CM | POA: Diagnosis not present

## 2019-06-04 DIAGNOSIS — Z20818 Contact with and (suspected) exposure to other bacterial communicable diseases: Secondary | ICD-10-CM | POA: Diagnosis not present

## 2019-06-04 DIAGNOSIS — Z20828 Contact with and (suspected) exposure to other viral communicable diseases: Secondary | ICD-10-CM | POA: Diagnosis not present

## 2019-06-04 DIAGNOSIS — R110 Nausea: Secondary | ICD-10-CM | POA: Diagnosis not present

## 2019-06-04 DIAGNOSIS — J302 Other seasonal allergic rhinitis: Secondary | ICD-10-CM | POA: Diagnosis not present

## 2019-06-04 DIAGNOSIS — R519 Headache, unspecified: Secondary | ICD-10-CM | POA: Diagnosis not present

## 2019-06-04 DIAGNOSIS — R509 Fever, unspecified: Secondary | ICD-10-CM | POA: Diagnosis not present

## 2019-06-04 DIAGNOSIS — J069 Acute upper respiratory infection, unspecified: Secondary | ICD-10-CM | POA: Diagnosis not present

## 2019-07-12 ENCOUNTER — Ambulatory Visit: Payer: PPO

## 2019-07-16 DIAGNOSIS — M545 Low back pain: Secondary | ICD-10-CM | POA: Diagnosis not present

## 2019-07-16 DIAGNOSIS — M5416 Radiculopathy, lumbar region: Secondary | ICD-10-CM | POA: Diagnosis not present

## 2019-07-16 DIAGNOSIS — G894 Chronic pain syndrome: Secondary | ICD-10-CM | POA: Diagnosis not present

## 2019-08-28 DIAGNOSIS — R110 Nausea: Secondary | ICD-10-CM | POA: Diagnosis not present

## 2019-08-28 DIAGNOSIS — Z1152 Encounter for screening for COVID-19: Secondary | ICD-10-CM | POA: Diagnosis not present

## 2019-08-28 DIAGNOSIS — G4452 New daily persistent headache (NDPH): Secondary | ICD-10-CM | POA: Diagnosis not present

## 2019-08-28 DIAGNOSIS — E039 Hypothyroidism, unspecified: Secondary | ICD-10-CM | POA: Diagnosis not present

## 2019-08-28 DIAGNOSIS — R634 Abnormal weight loss: Secondary | ICD-10-CM | POA: Diagnosis not present

## 2019-09-03 ENCOUNTER — Other Ambulatory Visit: Payer: Self-pay | Admitting: Internal Medicine

## 2019-09-03 DIAGNOSIS — G4452 New daily persistent headache (NDPH): Secondary | ICD-10-CM

## 2019-09-06 DIAGNOSIS — R112 Nausea with vomiting, unspecified: Secondary | ICD-10-CM | POA: Diagnosis not present

## 2019-09-06 DIAGNOSIS — R519 Headache, unspecified: Secondary | ICD-10-CM | POA: Diagnosis not present

## 2019-09-06 DIAGNOSIS — R634 Abnormal weight loss: Secondary | ICD-10-CM | POA: Diagnosis not present

## 2019-09-06 DIAGNOSIS — Z8719 Personal history of other diseases of the digestive system: Secondary | ICD-10-CM | POA: Diagnosis not present

## 2019-09-11 ENCOUNTER — Ambulatory Visit
Admission: RE | Admit: 2019-09-11 | Discharge: 2019-09-11 | Disposition: A | Discharge status: Home / Self Care | Payer: PPO | Source: Ambulatory Visit | Attending: Internal Medicine | Admitting: Internal Medicine

## 2019-09-11 DIAGNOSIS — G4452 New daily persistent headache (NDPH): Secondary | ICD-10-CM

## 2019-09-11 DIAGNOSIS — R519 Headache, unspecified: Secondary | ICD-10-CM | POA: Diagnosis not present

## 2019-10-07 DIAGNOSIS — M546 Pain in thoracic spine: Secondary | ICD-10-CM | POA: Diagnosis not present

## 2019-10-07 DIAGNOSIS — M519 Unspecified thoracic, thoracolumbar and lumbosacral intervertebral disc disorder: Secondary | ICD-10-CM | POA: Diagnosis not present

## 2019-10-18 DIAGNOSIS — Z23 Encounter for immunization: Secondary | ICD-10-CM | POA: Diagnosis not present

## 2019-10-24 ENCOUNTER — Encounter: Payer: Self-pay | Admitting: *Deleted

## 2019-10-24 NOTE — Progress Notes (Signed)
Abstracted chart for appt with Dr. Jannifer Franklin tomorrow 10/25/19. Source: Referring provider [facility], Dr. Philip Aspen.

## 2019-10-25 ENCOUNTER — Telehealth: Payer: Self-pay | Admitting: Neurology

## 2019-10-25 ENCOUNTER — Ambulatory Visit: Payer: PPO | Admitting: Neurology

## 2019-10-25 NOTE — Telephone Encounter (Signed)
No showed for appointment as a new patient.

## 2019-11-12 DIAGNOSIS — E119 Type 2 diabetes mellitus without complications: Secondary | ICD-10-CM | POA: Diagnosis not present

## 2019-11-12 DIAGNOSIS — G894 Chronic pain syndrome: Secondary | ICD-10-CM | POA: Diagnosis not present

## 2019-11-12 DIAGNOSIS — M5416 Radiculopathy, lumbar region: Secondary | ICD-10-CM | POA: Diagnosis not present

## 2019-11-12 DIAGNOSIS — M546 Pain in thoracic spine: Secondary | ICD-10-CM | POA: Diagnosis not present

## 2019-11-13 DIAGNOSIS — Z23 Encounter for immunization: Secondary | ICD-10-CM | POA: Diagnosis not present

## 2019-12-05 DIAGNOSIS — M546 Pain in thoracic spine: Secondary | ICD-10-CM | POA: Diagnosis not present

## 2019-12-05 DIAGNOSIS — M5136 Other intervertebral disc degeneration, lumbar region: Secondary | ICD-10-CM | POA: Diagnosis not present

## 2019-12-05 DIAGNOSIS — M961 Postlaminectomy syndrome, not elsewhere classified: Secondary | ICD-10-CM | POA: Diagnosis not present

## 2019-12-31 DIAGNOSIS — M546 Pain in thoracic spine: Secondary | ICD-10-CM | POA: Diagnosis not present

## 2019-12-31 DIAGNOSIS — M545 Low back pain: Secondary | ICD-10-CM | POA: Diagnosis not present

## 2020-01-11 DIAGNOSIS — M546 Pain in thoracic spine: Secondary | ICD-10-CM | POA: Diagnosis not present

## 2020-01-14 DIAGNOSIS — R112 Nausea with vomiting, unspecified: Secondary | ICD-10-CM | POA: Diagnosis not present

## 2020-01-14 DIAGNOSIS — E119 Type 2 diabetes mellitus without complications: Secondary | ICD-10-CM | POA: Diagnosis not present

## 2020-01-14 DIAGNOSIS — E039 Hypothyroidism, unspecified: Secondary | ICD-10-CM | POA: Diagnosis not present

## 2020-01-14 DIAGNOSIS — R634 Abnormal weight loss: Secondary | ICD-10-CM | POA: Diagnosis not present

## 2020-01-14 DIAGNOSIS — R5383 Other fatigue: Secondary | ICD-10-CM | POA: Diagnosis not present

## 2020-01-14 DIAGNOSIS — J302 Other seasonal allergic rhinitis: Secondary | ICD-10-CM | POA: Diagnosis not present

## 2020-01-14 DIAGNOSIS — R251 Tremor, unspecified: Secondary | ICD-10-CM | POA: Diagnosis not present

## 2020-01-14 DIAGNOSIS — F418 Other specified anxiety disorders: Secondary | ICD-10-CM | POA: Diagnosis not present

## 2020-02-10 ENCOUNTER — Other Ambulatory Visit: Payer: Self-pay | Admitting: Internal Medicine

## 2020-02-10 DIAGNOSIS — Z1231 Encounter for screening mammogram for malignant neoplasm of breast: Secondary | ICD-10-CM

## 2020-02-12 DIAGNOSIS — M25561 Pain in right knee: Secondary | ICD-10-CM | POA: Diagnosis not present

## 2020-02-20 DIAGNOSIS — B349 Viral infection, unspecified: Secondary | ICD-10-CM | POA: Diagnosis not present

## 2020-02-20 DIAGNOSIS — J029 Acute pharyngitis, unspecified: Secondary | ICD-10-CM | POA: Diagnosis not present

## 2020-02-20 DIAGNOSIS — Z20822 Contact with and (suspected) exposure to covid-19: Secondary | ICD-10-CM | POA: Diagnosis not present

## 2020-03-02 DIAGNOSIS — M6751 Plica syndrome, right knee: Secondary | ICD-10-CM | POA: Diagnosis not present

## 2020-03-02 DIAGNOSIS — M24661 Ankylosis, right knee: Secondary | ICD-10-CM | POA: Diagnosis not present

## 2020-03-02 DIAGNOSIS — Z96651 Presence of right artificial knee joint: Secondary | ICD-10-CM | POA: Diagnosis not present

## 2020-03-10 DIAGNOSIS — Z79899 Other long term (current) drug therapy: Secondary | ICD-10-CM | POA: Diagnosis not present

## 2020-03-10 DIAGNOSIS — M5416 Radiculopathy, lumbar region: Secondary | ICD-10-CM | POA: Diagnosis not present

## 2020-03-10 DIAGNOSIS — M25561 Pain in right knee: Secondary | ICD-10-CM | POA: Diagnosis not present

## 2020-04-21 DIAGNOSIS — F331 Major depressive disorder, recurrent, moderate: Secondary | ICD-10-CM | POA: Diagnosis not present

## 2020-04-21 DIAGNOSIS — F411 Generalized anxiety disorder: Secondary | ICD-10-CM | POA: Diagnosis not present

## 2020-04-28 DIAGNOSIS — F411 Generalized anxiety disorder: Secondary | ICD-10-CM | POA: Diagnosis not present

## 2020-04-28 DIAGNOSIS — E039 Hypothyroidism, unspecified: Secondary | ICD-10-CM | POA: Diagnosis not present

## 2020-04-28 DIAGNOSIS — E119 Type 2 diabetes mellitus without complications: Secondary | ICD-10-CM | POA: Diagnosis not present

## 2020-04-28 DIAGNOSIS — F331 Major depressive disorder, recurrent, moderate: Secondary | ICD-10-CM | POA: Diagnosis not present

## 2020-05-01 DIAGNOSIS — F419 Anxiety disorder, unspecified: Secondary | ICD-10-CM | POA: Diagnosis not present

## 2020-05-01 DIAGNOSIS — Z135 Encounter for screening for eye and ear disorders: Secondary | ICD-10-CM | POA: Diagnosis not present

## 2020-05-01 DIAGNOSIS — E669 Obesity, unspecified: Secondary | ICD-10-CM | POA: Diagnosis not present

## 2020-05-01 DIAGNOSIS — R82998 Other abnormal findings in urine: Secondary | ICD-10-CM | POA: Diagnosis not present

## 2020-05-01 DIAGNOSIS — Z23 Encounter for immunization: Secondary | ICD-10-CM | POA: Diagnosis not present

## 2020-05-01 DIAGNOSIS — Z0001 Encounter for general adult medical examination with abnormal findings: Secondary | ICD-10-CM | POA: Diagnosis not present

## 2020-05-01 DIAGNOSIS — E039 Hypothyroidism, unspecified: Secondary | ICD-10-CM | POA: Diagnosis not present

## 2020-05-01 DIAGNOSIS — H524 Presbyopia: Secondary | ICD-10-CM | POA: Diagnosis not present

## 2020-05-01 DIAGNOSIS — F329 Major depressive disorder, single episode, unspecified: Secondary | ICD-10-CM | POA: Diagnosis not present

## 2020-05-01 DIAGNOSIS — E119 Type 2 diabetes mellitus without complications: Secondary | ICD-10-CM | POA: Diagnosis not present

## 2020-05-26 DIAGNOSIS — G894 Chronic pain syndrome: Secondary | ICD-10-CM | POA: Diagnosis not present

## 2020-06-11 ENCOUNTER — Other Ambulatory Visit (HOSPITAL_COMMUNITY): Payer: Self-pay | Admitting: Orthopedic Surgery

## 2020-06-11 DIAGNOSIS — Z96659 Presence of unspecified artificial knee joint: Secondary | ICD-10-CM | POA: Diagnosis not present

## 2020-06-11 DIAGNOSIS — Z96651 Presence of right artificial knee joint: Secondary | ICD-10-CM

## 2020-06-11 DIAGNOSIS — M25561 Pain in right knee: Secondary | ICD-10-CM | POA: Diagnosis not present

## 2020-06-11 DIAGNOSIS — M1712 Unilateral primary osteoarthritis, left knee: Secondary | ICD-10-CM | POA: Diagnosis not present

## 2020-06-19 ENCOUNTER — Encounter (HOSPITAL_COMMUNITY): Payer: Self-pay

## 2020-06-19 ENCOUNTER — Encounter (HOSPITAL_COMMUNITY): Payer: Medicare HMO | Attending: Orthopedic Surgery

## 2020-06-19 ENCOUNTER — Encounter (HOSPITAL_COMMUNITY): Payer: Medicare HMO

## 2020-07-06 DIAGNOSIS — G894 Chronic pain syndrome: Secondary | ICD-10-CM | POA: Diagnosis not present

## 2020-07-13 DIAGNOSIS — E119 Type 2 diabetes mellitus without complications: Secondary | ICD-10-CM | POA: Diagnosis not present

## 2020-07-13 DIAGNOSIS — N390 Urinary tract infection, site not specified: Secondary | ICD-10-CM | POA: Diagnosis not present

## 2020-07-13 DIAGNOSIS — R300 Dysuria: Secondary | ICD-10-CM | POA: Diagnosis not present

## 2020-09-08 DIAGNOSIS — E119 Type 2 diabetes mellitus without complications: Secondary | ICD-10-CM | POA: Diagnosis not present

## 2020-09-08 DIAGNOSIS — N390 Urinary tract infection, site not specified: Secondary | ICD-10-CM | POA: Diagnosis not present

## 2020-09-08 DIAGNOSIS — R1012 Left upper quadrant pain: Secondary | ICD-10-CM | POA: Diagnosis not present

## 2020-09-09 ENCOUNTER — Other Ambulatory Visit: Payer: Self-pay | Admitting: Internal Medicine

## 2020-09-09 DIAGNOSIS — R1012 Left upper quadrant pain: Secondary | ICD-10-CM

## 2020-09-28 DIAGNOSIS — R109 Unspecified abdominal pain: Secondary | ICD-10-CM | POA: Diagnosis not present

## 2020-09-28 DIAGNOSIS — E1165 Type 2 diabetes mellitus with hyperglycemia: Secondary | ICD-10-CM | POA: Diagnosis not present

## 2020-09-28 DIAGNOSIS — N390 Urinary tract infection, site not specified: Secondary | ICD-10-CM | POA: Diagnosis not present

## 2020-09-28 DIAGNOSIS — I10 Essential (primary) hypertension: Secondary | ICD-10-CM | POA: Diagnosis not present

## 2020-09-28 DIAGNOSIS — F331 Major depressive disorder, recurrent, moderate: Secondary | ICD-10-CM | POA: Diagnosis not present

## 2020-09-28 DIAGNOSIS — I152 Hypertension secondary to endocrine disorders: Secondary | ICD-10-CM | POA: Diagnosis not present

## 2020-09-28 DIAGNOSIS — F411 Generalized anxiety disorder: Secondary | ICD-10-CM | POA: Diagnosis not present

## 2020-10-14 DIAGNOSIS — K219 Gastro-esophageal reflux disease without esophagitis: Secondary | ICD-10-CM | POA: Diagnosis not present

## 2020-10-14 DIAGNOSIS — R109 Unspecified abdominal pain: Secondary | ICD-10-CM | POA: Diagnosis not present

## 2020-10-22 ENCOUNTER — Other Ambulatory Visit: Payer: Self-pay | Admitting: Gastroenterology

## 2020-10-22 DIAGNOSIS — R634 Abnormal weight loss: Secondary | ICD-10-CM | POA: Diagnosis not present

## 2020-10-22 DIAGNOSIS — R112 Nausea with vomiting, unspecified: Secondary | ICD-10-CM | POA: Diagnosis not present

## 2020-10-22 DIAGNOSIS — R1013 Epigastric pain: Secondary | ICD-10-CM | POA: Diagnosis not present

## 2020-10-22 DIAGNOSIS — K219 Gastro-esophageal reflux disease without esophagitis: Secondary | ICD-10-CM | POA: Diagnosis not present

## 2020-10-22 DIAGNOSIS — R1012 Left upper quadrant pain: Secondary | ICD-10-CM | POA: Diagnosis not present

## 2020-10-22 DIAGNOSIS — Z1211 Encounter for screening for malignant neoplasm of colon: Secondary | ICD-10-CM | POA: Diagnosis not present

## 2020-10-22 DIAGNOSIS — Z9049 Acquired absence of other specified parts of digestive tract: Secondary | ICD-10-CM | POA: Diagnosis not present

## 2020-10-26 DIAGNOSIS — M25562 Pain in left knee: Secondary | ICD-10-CM | POA: Diagnosis not present

## 2020-10-27 ENCOUNTER — Inpatient Hospital Stay: Admission: RE | Admit: 2020-10-27 | Payer: Medicare HMO | Source: Ambulatory Visit

## 2020-11-05 DIAGNOSIS — M25561 Pain in right knee: Secondary | ICD-10-CM | POA: Diagnosis not present

## 2020-11-05 DIAGNOSIS — G894 Chronic pain syndrome: Secondary | ICD-10-CM | POA: Diagnosis not present

## 2020-11-11 ENCOUNTER — Other Ambulatory Visit: Payer: Self-pay

## 2020-11-11 ENCOUNTER — Encounter (HOSPITAL_COMMUNITY)
Admission: RE | Admit: 2020-11-11 | Discharge: 2020-11-11 | Disposition: A | Discharge status: Home / Self Care | Payer: Medicare HMO | Source: Ambulatory Visit | Attending: Orthopedic Surgery | Admitting: Orthopedic Surgery

## 2020-11-11 DIAGNOSIS — M1712 Unilateral primary osteoarthritis, left knee: Secondary | ICD-10-CM | POA: Diagnosis not present

## 2020-11-11 DIAGNOSIS — Z96651 Presence of right artificial knee joint: Secondary | ICD-10-CM | POA: Diagnosis not present

## 2020-11-11 DIAGNOSIS — Z96659 Presence of unspecified artificial knee joint: Secondary | ICD-10-CM | POA: Diagnosis not present

## 2020-11-11 MED ORDER — TECHNETIUM TC 99M MEDRONATE IV KIT
20.0000 | PACK | Freq: Once | INTRAVENOUS | Status: AC | PRN
  Administered 2020-11-11: 21.4 via INTRAVENOUS

## 2020-11-12 ENCOUNTER — Ambulatory Visit
Admission: RE | Admit: 2020-11-12 | Discharge: 2020-11-12 | Disposition: A | Discharge status: Home / Self Care | Payer: Medicare HMO | Source: Ambulatory Visit | Attending: Gastroenterology | Admitting: Gastroenterology

## 2020-11-12 DIAGNOSIS — R634 Abnormal weight loss: Secondary | ICD-10-CM

## 2020-11-12 DIAGNOSIS — R112 Nausea with vomiting, unspecified: Secondary | ICD-10-CM | POA: Diagnosis not present

## 2020-11-12 DIAGNOSIS — I700 Atherosclerosis of aorta: Secondary | ICD-10-CM | POA: Diagnosis not present

## 2020-11-12 DIAGNOSIS — R109 Unspecified abdominal pain: Secondary | ICD-10-CM | POA: Diagnosis not present

## 2020-11-12 MED ORDER — IOPAMIDOL (ISOVUE-300) INJECTION 61%
100.0000 mL | Freq: Once | INTRAVENOUS | Status: AC | PRN
  Administered 2020-11-12: 100 mL via INTRAVENOUS

## 2020-11-13 DIAGNOSIS — M25561 Pain in right knee: Secondary | ICD-10-CM | POA: Diagnosis not present

## 2020-11-16 ENCOUNTER — Other Ambulatory Visit (HOSPITAL_COMMUNITY): Payer: Self-pay | Admitting: Gastroenterology

## 2020-11-16 DIAGNOSIS — G5791 Unspecified mononeuropathy of right lower limb: Secondary | ICD-10-CM | POA: Diagnosis not present

## 2020-11-16 DIAGNOSIS — F331 Major depressive disorder, recurrent, moderate: Secondary | ICD-10-CM | POA: Diagnosis not present

## 2020-11-16 DIAGNOSIS — E039 Hypothyroidism, unspecified: Secondary | ICD-10-CM | POA: Diagnosis not present

## 2020-11-16 DIAGNOSIS — R112 Nausea with vomiting, unspecified: Secondary | ICD-10-CM

## 2020-11-16 DIAGNOSIS — F411 Generalized anxiety disorder: Secondary | ICD-10-CM | POA: Diagnosis not present

## 2020-11-23 ENCOUNTER — Other Ambulatory Visit: Payer: Self-pay | Admitting: Family Medicine

## 2020-11-23 DIAGNOSIS — E2839 Other primary ovarian failure: Secondary | ICD-10-CM

## 2020-12-01 ENCOUNTER — Ambulatory Visit (HOSPITAL_COMMUNITY): Payer: Medicare HMO

## 2020-12-08 DIAGNOSIS — R058 Other specified cough: Secondary | ICD-10-CM | POA: Diagnosis not present

## 2020-12-08 DIAGNOSIS — R059 Cough, unspecified: Secondary | ICD-10-CM | POA: Diagnosis not present

## 2020-12-08 DIAGNOSIS — Z20822 Contact with and (suspected) exposure to covid-19: Secondary | ICD-10-CM | POA: Diagnosis not present

## 2020-12-08 DIAGNOSIS — J3489 Other specified disorders of nose and nasal sinuses: Secondary | ICD-10-CM | POA: Diagnosis not present

## 2020-12-09 DIAGNOSIS — G8929 Other chronic pain: Secondary | ICD-10-CM | POA: Insufficient documentation

## 2020-12-15 ENCOUNTER — Other Ambulatory Visit: Payer: Self-pay

## 2020-12-15 ENCOUNTER — Other Ambulatory Visit: Payer: Medicare HMO

## 2020-12-15 DIAGNOSIS — J069 Acute upper respiratory infection, unspecified: Secondary | ICD-10-CM | POA: Diagnosis not present

## 2020-12-15 DIAGNOSIS — J189 Pneumonia, unspecified organism: Secondary | ICD-10-CM | POA: Diagnosis not present

## 2020-12-16 DIAGNOSIS — Z20822 Contact with and (suspected) exposure to covid-19: Secondary | ICD-10-CM | POA: Diagnosis not present

## 2020-12-17 DIAGNOSIS — B9689 Other specified bacterial agents as the cause of diseases classified elsewhere: Secondary | ICD-10-CM | POA: Diagnosis not present

## 2020-12-17 DIAGNOSIS — J329 Chronic sinusitis, unspecified: Secondary | ICD-10-CM | POA: Diagnosis not present

## 2020-12-17 DIAGNOSIS — J069 Acute upper respiratory infection, unspecified: Secondary | ICD-10-CM | POA: Diagnosis not present

## 2020-12-18 ENCOUNTER — Ambulatory Visit (HOSPITAL_COMMUNITY): Payer: Medicare HMO

## 2020-12-24 DIAGNOSIS — J189 Pneumonia, unspecified organism: Secondary | ICD-10-CM | POA: Diagnosis not present

## 2020-12-24 DIAGNOSIS — F411 Generalized anxiety disorder: Secondary | ICD-10-CM | POA: Diagnosis not present

## 2020-12-24 DIAGNOSIS — G4700 Insomnia, unspecified: Secondary | ICD-10-CM | POA: Diagnosis not present

## 2020-12-28 ENCOUNTER — Other Ambulatory Visit: Payer: Self-pay | Admitting: Specialist

## 2020-12-28 ENCOUNTER — Other Ambulatory Visit: Payer: Self-pay

## 2020-12-28 ENCOUNTER — Encounter (HOSPITAL_COMMUNITY): Payer: Medicare HMO

## 2020-12-28 ENCOUNTER — Ambulatory Visit
Admission: RE | Admit: 2020-12-28 | Discharge: 2020-12-28 | Disposition: A | Discharge status: Home / Self Care | Payer: Medicare HMO | Source: Ambulatory Visit | Attending: Specialist | Admitting: Specialist

## 2020-12-28 DIAGNOSIS — R52 Pain, unspecified: Secondary | ICD-10-CM

## 2020-12-28 DIAGNOSIS — M4810 Ankylosing hyperostosis [Forestier], site unspecified: Secondary | ICD-10-CM | POA: Diagnosis not present

## 2020-12-28 DIAGNOSIS — D1800 Hemangioma unspecified site: Secondary | ICD-10-CM | POA: Diagnosis not present

## 2020-12-28 DIAGNOSIS — M47814 Spondylosis without myelopathy or radiculopathy, thoracic region: Secondary | ICD-10-CM | POA: Diagnosis not present

## 2020-12-28 DIAGNOSIS — M546 Pain in thoracic spine: Secondary | ICD-10-CM | POA: Diagnosis not present

## 2020-12-29 DIAGNOSIS — E039 Hypothyroidism, unspecified: Secondary | ICD-10-CM | POA: Diagnosis not present

## 2020-12-29 DIAGNOSIS — E119 Type 2 diabetes mellitus without complications: Secondary | ICD-10-CM | POA: Diagnosis not present

## 2020-12-31 DIAGNOSIS — I10 Essential (primary) hypertension: Secondary | ICD-10-CM | POA: Diagnosis not present

## 2020-12-31 DIAGNOSIS — R7303 Prediabetes: Secondary | ICD-10-CM | POA: Diagnosis not present

## 2020-12-31 DIAGNOSIS — G4700 Insomnia, unspecified: Secondary | ICD-10-CM | POA: Diagnosis not present

## 2020-12-31 DIAGNOSIS — E559 Vitamin D deficiency, unspecified: Secondary | ICD-10-CM | POA: Diagnosis not present

## 2020-12-31 DIAGNOSIS — F411 Generalized anxiety disorder: Secondary | ICD-10-CM | POA: Diagnosis not present

## 2020-12-31 DIAGNOSIS — E039 Hypothyroidism, unspecified: Secondary | ICD-10-CM | POA: Diagnosis not present

## 2020-12-31 DIAGNOSIS — F331 Major depressive disorder, recurrent, moderate: Secondary | ICD-10-CM | POA: Diagnosis not present

## 2021-01-07 ENCOUNTER — Other Ambulatory Visit: Payer: Self-pay

## 2021-01-07 ENCOUNTER — Ambulatory Visit
Admission: RE | Admit: 2021-01-07 | Discharge: 2021-01-07 | Disposition: A | Discharge status: Home / Self Care | Payer: Medicare HMO | Source: Ambulatory Visit | Attending: Family Medicine | Admitting: Family Medicine

## 2021-01-07 ENCOUNTER — Ambulatory Visit
Admission: RE | Admit: 2021-01-07 | Discharge: 2021-01-07 | Disposition: A | Discharge status: Home / Self Care | Payer: Medicare HMO | Source: Ambulatory Visit | Attending: Internal Medicine | Admitting: Internal Medicine

## 2021-01-07 DIAGNOSIS — Z780 Asymptomatic menopausal state: Secondary | ICD-10-CM | POA: Diagnosis not present

## 2021-01-07 DIAGNOSIS — Z1231 Encounter for screening mammogram for malignant neoplasm of breast: Secondary | ICD-10-CM

## 2021-01-07 DIAGNOSIS — E2839 Other primary ovarian failure: Secondary | ICD-10-CM

## 2021-01-11 ENCOUNTER — Encounter (HOSPITAL_COMMUNITY): Payer: Medicare HMO | Attending: Gastroenterology

## 2021-01-11 ENCOUNTER — Encounter (HOSPITAL_COMMUNITY): Payer: Self-pay

## 2021-01-11 ENCOUNTER — Other Ambulatory Visit: Payer: Self-pay | Admitting: Internal Medicine

## 2021-01-11 DIAGNOSIS — R928 Other abnormal and inconclusive findings on diagnostic imaging of breast: Secondary | ICD-10-CM

## 2021-01-12 ENCOUNTER — Other Ambulatory Visit: Payer: Self-pay | Admitting: Family Medicine

## 2021-01-12 DIAGNOSIS — F331 Major depressive disorder, recurrent, moderate: Secondary | ICD-10-CM | POA: Diagnosis not present

## 2021-01-12 DIAGNOSIS — F411 Generalized anxiety disorder: Secondary | ICD-10-CM | POA: Diagnosis not present

## 2021-01-12 DIAGNOSIS — G4700 Insomnia, unspecified: Secondary | ICD-10-CM | POA: Diagnosis not present

## 2021-01-12 DIAGNOSIS — G894 Chronic pain syndrome: Secondary | ICD-10-CM | POA: Diagnosis not present

## 2021-01-15 DIAGNOSIS — F410 Panic disorder [episodic paroxysmal anxiety] without agoraphobia: Secondary | ICD-10-CM | POA: Diagnosis not present

## 2021-01-15 DIAGNOSIS — Z811 Family history of alcohol abuse and dependence: Secondary | ICD-10-CM | POA: Diagnosis not present

## 2021-01-15 DIAGNOSIS — F3289 Other specified depressive episodes: Secondary | ICD-10-CM | POA: Diagnosis not present

## 2021-01-27 DIAGNOSIS — F331 Major depressive disorder, recurrent, moderate: Secondary | ICD-10-CM | POA: Diagnosis not present

## 2021-01-27 DIAGNOSIS — F411 Generalized anxiety disorder: Secondary | ICD-10-CM | POA: Diagnosis not present

## 2021-01-27 DIAGNOSIS — G4700 Insomnia, unspecified: Secondary | ICD-10-CM | POA: Diagnosis not present

## 2021-02-02 ENCOUNTER — Ambulatory Visit
Admission: RE | Admit: 2021-02-02 | Discharge: 2021-02-02 | Disposition: A | Discharge status: Home / Self Care | Payer: Medicare HMO | Source: Ambulatory Visit | Attending: Internal Medicine | Admitting: Internal Medicine

## 2021-02-02 ENCOUNTER — Other Ambulatory Visit: Payer: Self-pay

## 2021-02-02 ENCOUNTER — Other Ambulatory Visit: Payer: Self-pay | Admitting: Internal Medicine

## 2021-02-02 DIAGNOSIS — R928 Other abnormal and inconclusive findings on diagnostic imaging of breast: Secondary | ICD-10-CM

## 2021-02-02 DIAGNOSIS — R922 Inconclusive mammogram: Secondary | ICD-10-CM | POA: Diagnosis not present

## 2021-02-02 DIAGNOSIS — R921 Mammographic calcification found on diagnostic imaging of breast: Secondary | ICD-10-CM | POA: Diagnosis not present

## 2021-02-04 ENCOUNTER — Other Ambulatory Visit: Payer: Self-pay

## 2021-02-04 ENCOUNTER — Other Ambulatory Visit: Payer: Self-pay | Admitting: Internal Medicine

## 2021-02-04 ENCOUNTER — Ambulatory Visit
Admission: RE | Admit: 2021-02-04 | Discharge: 2021-02-04 | Disposition: A | Discharge status: Home / Self Care | Payer: Medicare HMO | Source: Ambulatory Visit | Attending: Internal Medicine | Admitting: Internal Medicine

## 2021-02-04 DIAGNOSIS — R928 Other abnormal and inconclusive findings on diagnostic imaging of breast: Secondary | ICD-10-CM

## 2021-02-04 DIAGNOSIS — R921 Mammographic calcification found on diagnostic imaging of breast: Secondary | ICD-10-CM | POA: Diagnosis not present

## 2021-02-04 DIAGNOSIS — N6011 Diffuse cystic mastopathy of right breast: Secondary | ICD-10-CM | POA: Diagnosis not present

## 2021-02-04 DIAGNOSIS — D0511 Intraductal carcinoma in situ of right breast: Secondary | ICD-10-CM | POA: Diagnosis not present

## 2021-02-05 ENCOUNTER — Telehealth: Payer: Self-pay | Admitting: Hematology and Oncology

## 2021-02-05 ENCOUNTER — Other Ambulatory Visit: Payer: Self-pay | Admitting: Internal Medicine

## 2021-02-05 DIAGNOSIS — C50911 Malignant neoplasm of unspecified site of right female breast: Secondary | ICD-10-CM

## 2021-02-05 NOTE — Telephone Encounter (Signed)
Spoke to patient to confirm afternoon [facility] appointment for 7/20, packet sent via email

## 2021-02-08 ENCOUNTER — Encounter: Payer: Self-pay | Admitting: *Deleted

## 2021-02-08 DIAGNOSIS — D0511 Intraductal carcinoma in situ of right breast: Secondary | ICD-10-CM

## 2021-02-09 ENCOUNTER — Encounter: Payer: Self-pay | Admitting: *Deleted

## 2021-02-09 ENCOUNTER — Other Ambulatory Visit: Payer: Self-pay

## 2021-02-09 DIAGNOSIS — D0511 Intraductal carcinoma in situ of right breast: Secondary | ICD-10-CM

## 2021-02-09 DIAGNOSIS — I10 Essential (primary) hypertension: Secondary | ICD-10-CM | POA: Insufficient documentation

## 2021-02-09 DIAGNOSIS — Z170 Estrogen receptor positive status [ER+]: Secondary | ICD-10-CM | POA: Insufficient documentation

## 2021-02-09 NOTE — Progress Notes (Signed)
[place] NOTE  Patient Care Team: [name], MD as PCP - General (Family Medicine) Mauro Kaufmann, RN as Oncology Nurse Navigator Rockwell Germany, RN as Oncology Nurse Navigator Stark Klein, MD as Consulting Physician (General Surgery) Nicholas Lose, MD as Consulting Physician (Hematology and Oncology) Eppie Gibson, MD as Attending Physician (Radiation Oncology)  CHIEF COMPLAINTS/PURPOSE OF CONSULTATION:  Newly diagnosed right breast cancer  HISTORY OF PRESENTING ILLNESS:  Carol [name] 65 y.o. female is here because of recent diagnosis of DCIS of the right breast. Screening mammogram on 01/10/21 showed a possible mass and calcifications in the right breast. Diagnostic mammogram and [place] on 02/02/21 showed suspicious right breast calcifications and right breast cysts. Biopsy on 02/04/21 showed DCIS with necrosis and calcifications ER+ (95%)/ PR+ (30%). She presents to the clinic today for initial evaluation and discussion of treatment options.   I reviewed her records extensively and collaborated the history with the patient.  SUMMARY OF ONCOLOGIC HISTORY: Oncology History  Ductal carcinoma in situ (DCIS) of right breast  02/02/2021 Initial Diagnosis   Screening mammogram on 01/10/21 showed a possible mass and calcifications in the right breast. Diagnostic mammogram and [place] on 02/02/21 showed suspicious right breast calcifications and right breast cysts. Biopsy on 02/04/21 showed DCIS with necrosis and calcifications ER+ (95%)/ PR+ (30%).     MEDICAL HISTORY:  Past Medical History:  Diagnosis Date   Anxiety    Arthritis    Balance problems    Chronic anxiety    Chronic depression    Chronic diarrhea    after gallbladder surgery   Chronic low back pain    Chronic seasonal allergic rhinitis    Complication of anesthesia    pt states has difficulty with being put to sleep    Exogenous obesity    Falls    Family history of adverse reaction to  anesthesia    pts daughter has N&V   Fatigue    Hemorrhoids    History of blood clots    History of blood transfusion    2006   History of bronchitis    History of chicken pox    History of frequent urinary tract infections    History of gallstones    History of jaundice    History of kidney stones    History of measles as a child    History of mumps as a child    Hypothyroidism    Insomnia    Night sweats    Pneumonia    hx of    Polycythemia    PONV (postoperative nausea and vomiting)    Poor sleep    chronic poor quality sleep wit snoring, sleep study in Nov 2013 showed mild central sleep apnea with hypoxia felt due to narcotics, no significant OSA or RLS   Thyroid disease    Tinnitus    Type II diabetes mellitus ([place]) 11/2016    SURGICAL HISTORY: Past Surgical History:  Procedure Laterality Date   ABDOMINAL HYSTERECTOMY     APPENDECTOMY  1972   BACK SURGERY     BREAST BIOPSY Right    years ago- unsure when   CHOLECYSTECTOMY  1987   COLONOSCOPY  11/2011   showed mild diverticulosis, internal hemorrhoids, and no polyps   ENDOSCOPY with esophageal dilatation  03/14/2016   KNEE ARTHROSCOPY Right 01/07/2016   Procedure: ARTHROSCOPY RIGHT KNEE, DEBRIDEMENT OF ARTHROFIBROSIS, AND EXAM UNDER ANESTHESIA;  Surgeon: Susa Day, MD;  Location: [facility];  Service: Orthopedics;  Laterality: Right;   KNEE ARTHROSCOPY Right 10/04/2017   Procedure: Right knee arthroscopy, evaluation under anesthesia, lysis of adhesions;  Surgeon: Susa Day, MD;  Location: [facility];  Service: Orthopedics;  Laterality: Right;  60 mins   KNEE SURGERY     L3/4  fusion  2004   L3/4 discectomy  2003   L4/5 discectomy  2001   L4/5 fusion  2002   left foot surgery      to repair break - 2002   left knee meniscectomy  2012   lumbar spine ESI  12/2017   menicus tear     bilat;    right knee arthroscopy  12/2015   Dr. Tonita Cong   right knee meniscectomy  2012   TONSILLECTOMY     TOTAL KNEE  ARTHROPLASTY Right 03/27/2015   Procedure: RIGHT TOTAL KNEE ARTHROPLASTY;  Surgeon: Susa Day, MD;  Location: [facility];  Service: Orthopedics;  Laterality: Right;   total knee replacement Right 03/2015   VAGINAL HYSTERECTOMY  1996   with ovaries intact, accidental bladder laceration   WRIST SURGERY  11/2016   Dr. Amedeo Plenty    SOCIAL HISTORY: Social History   Socioeconomic History   Marital status: Married    Spouse name: Not on file   Number of children: Not on file   Years of education: Not on file   Highest education level: Not on file  Occupational History   Not on file  Tobacco Use   Smoking status: Never   Smokeless tobacco: Never  Vaping Use   Vaping Use: Never used  Substance and Sexual Activity   Alcohol use: No   Drug use: No   Sexual activity: Yes    Birth control/protection: None  Other Topics Concern   Not on file  Social History Narrative   Not on file   Social Determinants of Health   Financial Resource Strain: Not on file  Food Insecurity: Not on file  Transportation Needs: Not on file  Physical Activity: Not on file  Stress: Not on file  Social Connections: Not on file  Intimate Partner Violence: Not on file    FAMILY HISTORY: Family History  Problem Relation Age of Onset   Hypertension Mother    COPD Mother    Diabetes Mother    Heart attack Father 68   Myasthenia gravis Daughter    Depression Daughter    Other Son        esophageal dysmotility, Peanut Allergy   Diabetes type II Other    Other Other        premature cardiovascular disease   Breast cancer Maternal Aunt    Cancer - Prostate Paternal Uncle     ALLERGIES:  is allergic to acetaminophen, celebrex [celecoxib], povidone iodine, codeine, and doxycycline.  MEDICATIONS:  Current Outpatient Medications  Medication Sig Dispense Refill   ALPRAZolam (XANAX) 0.25 MG tablet Take 0.25 mg by mouth as needed for anxiety.     baclofen (LIORESAL) 10 MG tablet Take 10 mg by mouth 3  (three) times daily.     levothyroxine (SYNTHROID) 88 MCG tablet Take 88 mcg by mouth daily before breakfast.     Multiple Vitamins-Minerals (CENTRUM SILVER PO) Take by mouth.     ondansetron (ZOFRAN-ODT) 8 MG disintegrating tablet Take 8 mg by mouth every 8 (eight) hours as needed for nausea or vomiting.     oxybutynin (DITROPAN) 5 MG tablet Take 5 mg by mouth daily.     Oxycodone HCl  10 MG TABS Take 10 mg by mouth in the morning and at bedtime.     traZODone (DESYREL) 50 MG tablet Take 50 mg by mouth at bedtime.     venlafaxine XR (EFFEXOR-XR) 75 MG 24 hr capsule Take 75 mg by mouth daily with breakfast.     divalproex (DEPAKOTE) 125 MG DR tablet Take 125 mg by mouth daily as needed (anxiety). (Patient not taking: Reported on 02/10/2021)     DULoxetine (CYMBALTA) 30 MG capsule Take 30 mg by mouth daily. (Patient not taking: Reported on 02/10/2021)     fluticasone (FLONASE) 50 MCG/ACT nasal spray Place 2 sprays into both nostrils daily. (Patient not taking: Reported on 02/10/2021)     metFORMIN (GLUCOPHAGE-XR) 500 MG 24 hr tablet Take 500 mg by mouth 2 (two) times daily. (Patient not taking: Reported on 02/10/2021)     Naproxen Sodium (ALEVE PO) Take 2 capsules by mouth 2 (two) times daily as needed (pain). (Patient not taking: Reported on 02/10/2021)     promethazine (PHENERGAN) 25 MG tablet Take 25 mg by mouth as needed for nausea. (Patient not taking: Reported on 02/10/2021)     propantheline (PROBANTHINE) 15 MG tablet Take 15 mg by mouth 2 (two) times daily as needed (excessive sweating). (Patient not taking: Reported on 02/10/2021)     Semaglutide (OZEMPIC, 1 MG/DOSE, [place]) Inject 1 mg into the skin once a week. (Patient not taking: Reported on 02/10/2021)     tiZANidine (ZANAFLEX) 4 MG capsule Take 4 mg by mouth at bedtime. (Patient not taking: Reported on 02/10/2021)     No current facility-administered medications for this visit.    REVIEW OF SYSTEMS:   Constitutional: Denies fevers, chills or  abnormal night sweats   All other systems were reviewed with the patient and are negative.  PHYSICAL EXAMINATION: ECOG PERFORMANCE STATUS: 1 - Symptomatic but completely ambulatory  Vitals:   02/10/21 1254  BP: 137/60  Pulse: 72  Resp: 18  Temp: 98 F (36.7 C)  SpO2: 96%   Filed Weights   02/10/21 1254  Weight: 207 lb 4.8 oz (94 kg)     LABORATORY DATA:  I have reviewed the data as listed Lab Results  Component Value Date   WBC 6.6 02/10/2021   HGB 14.2 02/10/2021   HCT 43.7 02/10/2021   MCV 90.5 02/10/2021   PLT 234 02/10/2021   Lab Results  Component Value Date   NA 137 02/10/2021   K 4.5 02/10/2021   CL 101 02/10/2021   CO2 28 02/10/2021    RADIOGRAPHIC STUDIES: I have personally reviewed the radiological reports and agreed with the findings in the report.  ASSESSMENT AND PLAN:  Ductal carcinoma in situ (DCIS) of right breast 02/02/2021:Screening mammogram on 01/10/21 showed a possible mass and calcifications in the right breast. Diagnostic mammogram and [place] on 02/02/21 showed suspicious right breast calcifications and right breast cysts. Biopsy on 02/04/21 showed DCIS with necrosis and calcifications ER+ (95%)/ PR+ (30%).  Pathology review: I discussed with the patient the difference between DCIS and invasive breast cancer. It is considered a precancerous lesion. DCIS is classified as a 0. It is generally detected through mammograms as calcifications. We discussed the significance of grades and its impact on prognosis. We also discussed the importance of ER and PR receptors and their implications to adjuvant treatment options. Prognosis of DCIS dependence on grade, comedo necrosis. It is anticipated that if not treated, 20-30% of DCIS can develop into invasive breast cancer.  Recommendation:  1. Breast conserving surgery 2. Followed by adjuvant radiation therapy 3. Followed by antiestrogen therapy with tamoxifen 5 years  Tamoxifen counseling: We discussed the  risks and benefits of tamoxifen. These include but not limited to insomnia, hot flashes, mood changes, vaginal dryness, and weight gain. Although rare, serious side effects including endometrial cancer, risk of blood clots were also discussed. We strongly believe that the benefits far outweigh the risks. Patient understands these risks and consented to starting treatment. Planned treatment duration is 5 years.  Return to clinic after surgery to discuss the final pathology report and come up with an adjuvant treatment plan.   All questions were answered. The patient knows to call the clinic with any problems, questions or concerns.   Rulon Eisenmenger, MD, MPH 02/10/2021    I, Thana Ates, am acting as scribe for Nicholas Lose, MD.  I have reviewed the above documentation for accuracy and completeness, and I agree with the above.

## 2021-02-09 NOTE — Progress Notes (Signed)
Radiation Oncology         [phone] ________________________________  Initial Outpatient Consultation  Name: Carol [name] MRN: [id number]  Date: 02/10/2021  DOB: 1956-02-11  [id number], Mechele Claude, MD  Stark Klein, MD   REFERRING PHYSICIAN: Stark Klein, MD  DIAGNOSIS:    ICD-10-CM   1. Ductal carcinoma in situ (DCIS) of right breast  D05.11      Cancer Staging Ductal carcinoma in situ (DCIS) of right breast Staging form: Breast, AJCC 8th Edition - Clinical stage from 02/10/2021: G3, ER+, PR+, HER2: Not Assessed - Unsigned Stage prefix: Initial diagnosis Histologic grading system: 3 grade system  STAGE 0   CHIEF COMPLAINT: Here to discuss management of right breast DCIS  HISTORY OF PRESENT ILLNESS::Carol [name] is a 65 y.o. female who presented with a possible breast abnormality on the following imaging: bilateral screening mammogram on the date of 01/07/21.  Targeted right breast ultrasound (and diagnostic right mammogram) performed on 02/02/21 revealed: a 7 x 8 x 6 mm cyst at the 11 o'clock position 2 cm from the nipple. Also seen was an adjacent 8 x 5 x 6 mm cyst. No suspicious mass was identified within the upper-outer right breast, however, there were suspicious outer right breast calcifications; approximately 11 cm of oriented pleomorphic calcifications. Upon physical examination, no discrete mass was palpated within the upper-outer right breast.  Outer right breast needle core biopsy on date of 02/04/21 showed an intermediate to high grade ductal carcinoma in situ with necrosis and calcifications from the anterior clip biopsy, and fibrocystic changes with usual ductal hyperplasia and calcifications as well as focal atypical lobular hyperplasia from the posterior coil clip biopsy.  ER status: >95%, positive; PR status: 30%, positive; both with strong staining intensity.   She is here with her daughter today.  She is in her usual state of health.  PREVIOUS RADIATION  THERAPY: No  PAST MEDICAL HISTORY:  has a past medical history of Anxiety, Arthritis, Balance problems, Chronic anxiety, Chronic depression, Chronic diarrhea, Chronic low back pain, Chronic seasonal allergic rhinitis, Complication of anesthesia, Exogenous obesity, Falls, Family history of adverse reaction to anesthesia, Fatigue, Hemorrhoids, History of blood clots, History of blood transfusion, History of bronchitis, History of chicken pox, History of frequent urinary tract infections, History of gallstones, History of jaundice, History of kidney stones, History of measles as a child, History of mumps as a child, Hypothyroidism, Insomnia, Night sweats, Pneumonia, Polycythemia, PONV (postoperative nausea and vomiting), Poor sleep, Thyroid disease, Tinnitus, and Type II diabetes mellitus ([place]) (11/2016).    PAST SURGICAL HISTORY: Past Surgical History:  Procedure Laterality Date   ABDOMINAL HYSTERECTOMY     APPENDECTOMY  1972   BACK SURGERY     BREAST BIOPSY Right    years ago- unsure when   [place]   COLONOSCOPY  11/2011   showed mild diverticulosis, internal hemorrhoids, and no polyps   ENDOSCOPY with esophageal dilatation  03/14/2016   KNEE ARTHROSCOPY Right 01/07/2016   Procedure: ARTHROSCOPY RIGHT KNEE, DEBRIDEMENT OF ARTHROFIBROSIS, AND EXAM UNDER ANESTHESIA;  Surgeon: Susa Day, MD;  Location: [facility];  Service: Orthopedics;  Laterality: Right;   KNEE ARTHROSCOPY Right 10/04/2017   Procedure: Right knee arthroscopy, evaluation under anesthesia, lysis of adhesions;  Surgeon: Susa Day, MD;  Location: [facility];  Service: Orthopedics;  Laterality: Right;  60 mins   KNEE SURGERY     L3/4  fusion  2004   L3/4 discectomy  2003   L4/5 discectomy  2001   L4/5 fusion  2002   left foot surgery      to repair break - 2002   left knee meniscectomy  2012   lumbar spine ESI  12/2017   menicus tear     bilat;    right knee arthroscopy  12/2015   Dr. Tonita Cong   right knee  meniscectomy  2012   TONSILLECTOMY     TOTAL KNEE ARTHROPLASTY Right 03/27/2015   Procedure: RIGHT TOTAL KNEE ARTHROPLASTY;  Surgeon: Susa Day, MD;  Location: [facility];  Service: Orthopedics;  Laterality: Right;   total knee replacement Right 03/2015   VAGINAL HYSTERECTOMY  1996   with ovaries intact, accidental bladder laceration   WRIST SURGERY  11/2016   Dr. Amedeo Plenty    FAMILY HISTORY: family history includes Breast cancer in her maternal aunt; COPD in her mother; Cancer - Prostate in her paternal uncle; Depression in her daughter; Diabetes in her mother; Diabetes type II in an other family member; Heart attack (age of onset: 39) in her father; Hypertension in her mother; Myasthenia gravis in her daughter; Other in her son and another family member.  SOCIAL HISTORY:  reports that she has never smoked. She has never used smokeless tobacco. She reports that she does not drink alcohol and does not use drugs.  ALLERGIES: Acetaminophen, Celebrex [celecoxib], Povidone iodine, Codeine, and Doxycycline  MEDICATIONS:  Current Outpatient Medications  Medication Sig Dispense Refill   ALPRAZolam (XANAX) 0.25 MG tablet Take 0.25 mg by mouth as needed for anxiety.     baclofen (LIORESAL) 10 MG tablet Take 10 mg by mouth 3 (three) times daily.     divalproex (DEPAKOTE) 125 MG DR tablet Take 125 mg by mouth daily as needed (anxiety). (Patient not taking: Reported on 02/10/2021)     DULoxetine (CYMBALTA) 30 MG capsule Take 30 mg by mouth daily. (Patient not taking: Reported on 02/10/2021)     fluticasone (FLONASE) 50 MCG/ACT nasal spray Place 2 sprays into both nostrils daily. (Patient not taking: Reported on 02/10/2021)     levothyroxine (SYNTHROID) 88 MCG tablet Take 88 mcg by mouth daily before breakfast.     metFORMIN (GLUCOPHAGE-XR) 500 MG 24 hr tablet Take 500 mg by mouth 2 (two) times daily. (Patient not taking: Reported on 02/10/2021)     Multiple Vitamins-Minerals (CENTRUM SILVER PO) Take by  mouth.     Naproxen Sodium (ALEVE PO) Take 2 capsules by mouth 2 (two) times daily as needed (pain). (Patient not taking: Reported on 02/10/2021)     ondansetron (ZOFRAN-ODT) 8 MG disintegrating tablet Take 8 mg by mouth every 8 (eight) hours as needed for nausea or vomiting.     oxybutynin (DITROPAN) 5 MG tablet Take 5 mg by mouth daily.     Oxycodone HCl 10 MG TABS Take 10 mg by mouth in the morning and at bedtime.     promethazine (PHENERGAN) 25 MG tablet Take 25 mg by mouth as needed for nausea. (Patient not taking: Reported on 02/10/2021)     propantheline (PROBANTHINE) 15 MG tablet Take 15 mg by mouth 2 (two) times daily as needed (excessive sweating). (Patient not taking: Reported on 02/10/2021)     Semaglutide (OZEMPIC, 1 MG/DOSE, [place]) Inject 1 mg into the skin once a week. (Patient not taking: Reported on 02/10/2021)     tiZANidine (ZANAFLEX) 4 MG capsule Take 4 mg by mouth at bedtime. (Patient not taking: Reported on 02/10/2021)     traZODone (DESYREL) 50 MG tablet Take  50 mg by mouth at bedtime.     venlafaxine XR (EFFEXOR-XR) 75 MG 24 hr capsule Take 75 mg by mouth daily with breakfast.     No current facility-administered medications for this encounter.    REVIEW OF SYSTEMS: As above   PHYSICAL EXAM:  vitals were not taken for this visit.   General: Alert and oriented, in no acute distress HEENT: Head is normocephalic. Extraocular movements are intact.  Heart: Regular in rate and rhythm with no murmurs, rubs, or gallops. Chest: Clear to auscultation bilaterally, with no rhonchi, wheezes, or rales. Abdomen: Soft, nontender, nondistended, with no rigidity or guarding. Skin: No concerning lesions. Musculoskeletal: symmetric strength and muscle tone throughout. Neurologic: No tremor.  No obvious focalities. Speech is fluent. Coordination is intact. Psychiatric: Judgment and insight are intact. Affect is appropriate. Breasts: Postbiopsy firmness in the right breast but no obvious mass.  No other palpable masses appreciated in the breasts or axillae bilaterally.    ECOG = 0  0 - Asymptomatic (Fully active, able to carry on all predisease activities without restriction)  1 - Symptomatic but completely ambulatory (Restricted in physically strenuous activity but ambulatory and able to carry out work of a light or sedentary nature. For example, light housework, office work)  2 - Symptomatic, <50% in bed during the day (Ambulatory and capable of all self care but unable to carry out any work activities. Up and about more than 50% of waking hours)  3 - Symptomatic, >50% in bed, but not bedbound (Capable of only limited self-care, confined to bed or chair 50% or more of waking hours)  4 - Bedbound (Completely disabled. Cannot carry on any self-care. Totally confined to bed or chair)  5 - Death   Eustace Pen MM, Creech RH, Tormey DC, et al. [id number]). "Toxicity and response criteria of the [facility]". [place] Oncol. 5 (6): 649-55   LABORATORY DATA:  Lab Results  Component Value Date   WBC 6.6 02/10/2021   HGB 14.2 02/10/2021   HCT 43.7 02/10/2021   MCV 90.5 02/10/2021   PLT 234 02/10/2021   CMP     Component Value Date/Time   NA 137 02/10/2021 1234   K 4.5 02/10/2021 1234   CL 101 02/10/2021 1234   CO2 28 02/10/2021 1234   GLUCOSE 208 (H) 02/10/2021 1234   BUN 14 02/10/2021 1234   CREATININE 0.79 02/10/2021 1234   CALCIUM 9.2 02/10/2021 1234   PROT 7.4 02/10/2021 1234   ALBUMIN 4.0 02/10/2021 1234   AST 25 02/10/2021 1234   ALT 31 02/10/2021 1234   ALKPHOS 98 02/10/2021 1234   BILITOT 0.6 02/10/2021 1234   GFRNONAA >60 02/10/2021 1234   GFRAA >60 09/27/2017 1350         RADIOGRAPHY: US BREAST LTD UNI RIGHT INC AXILLA  Result Date: 02/02/2021 CLINICAL DATA:  Patient recalled from screening for right breast asymmetry and calcifications. EXAM: DIGITAL DIAGNOSTIC UNILATERAL RIGHT MAMMOGRAM WITH TOMOSYNTHESIS AND CAD; ULTRASOUND RIGHT  BREAST LIMITED TECHNIQUE: Right digital diagnostic mammography and breast tomosynthesis was performed. The images were evaluated with computer-aided detection.; Targeted ultrasound examination of the right breast was performed COMPARISON:  Previous exam(s). ACR Breast Density Category c: The breast tissue is heterogeneously dense, which may obscure small masses. FINDINGS: Within the upper-outer right breast there is a persistent focal asymmetry. Additionally within the outer right breast coursing from the lateral retroareolar location to the mid/posterior breast is approximately 11 cm of linearly oriented pleomorphic calcifications.  On physical exam, no discrete mass is palpated within the upper-outer right breast. Targeted ultrasound is performed, showing a 7 x 8 x 6 mm cyst right breast 11 o'clock position 2 cm from nipple. There is an adjacent 8 x 5 x 6 mm cyst. No suspicious mass identified within the upper-outer right breast. Dense tissue is visualized. IMPRESSION: 1. Suspicious right breast calcifications. 2. Right breast cysts. RECOMMENDATION: Stereotactic guided core needle biopsy of the anterior and posterior aspect of the linearly oriented pleomorphic calcifications involving the outer right breast extending from the nipple to the posterior right breast. I have discussed the findings and recommendations with the patient. If applicable, a reminder letter will be sent to the patient regarding the next appointment. BI-RADS CATEGORY  4: Suspicious. Electronically Signed   By: Lovey Newcomer M.D.   On: 02/02/2021 16:37  MM DIAG BREAST TOMO UNI RIGHT  Result Date: 02/02/2021 CLINICAL DATA:  Patient recalled from screening for right breast asymmetry and calcifications. EXAM: DIGITAL DIAGNOSTIC UNILATERAL RIGHT MAMMOGRAM WITH TOMOSYNTHESIS AND CAD; ULTRASOUND RIGHT BREAST LIMITED TECHNIQUE: Right digital diagnostic mammography and breast tomosynthesis was performed. The images were evaluated with computer-aided  detection.; Targeted ultrasound examination of the right breast was performed COMPARISON:  Previous exam(s). ACR Breast Density Category c: The breast tissue is heterogeneously dense, which may obscure small masses. FINDINGS: Within the upper-outer right breast there is a persistent focal asymmetry. Additionally within the outer right breast coursing from the lateral retroareolar location to the mid/posterior breast is approximately 11 cm of linearly oriented pleomorphic calcifications. On physical exam, no discrete mass is palpated within the upper-outer right breast. Targeted ultrasound is performed, showing a 7 x 8 x 6 mm cyst right breast 11 o'clock position 2 cm from nipple. There is an adjacent 8 x 5 x 6 mm cyst. No suspicious mass identified within the upper-outer right breast. Dense tissue is visualized. IMPRESSION: 1. Suspicious right breast calcifications. 2. Right breast cysts. RECOMMENDATION: Stereotactic guided core needle biopsy of the anterior and posterior aspect of the linearly oriented pleomorphic calcifications involving the outer right breast extending from the nipple to the posterior right breast. I have discussed the findings and recommendations with the patient. If applicable, a reminder letter will be sent to the patient regarding the next appointment. BI-RADS CATEGORY  4: Suspicious. Electronically Signed   By: Lovey Newcomer M.D.   On: 02/02/2021 16:37  MM CLIP PLACEMENT RIGHT  Result Date: 02/04/2021 CLINICAL DATA:  Post procedure mammogram for clip placement. EXAM: 3D DIAGNOSTIC RIGHT MAMMOGRAM POST STEREOTACTIC BIOPSY COMPARISON:  Previous exam(s). FINDINGS: 3D Mammographic images were obtained following stereotactic guided biopsy of calcifications in the outer anterior right breast. The X biopsy marking clip is in expected position at the site of biopsy. 3D Mammographic images were obtained following stereotactic guided biopsy of calcifications in the. The coil biopsy marking clip  appears displaced medially by approximately 5.7 cm. IMPRESSION: 1. Appropriate positioning of the X shaped biopsy marking clip at the site of biopsy in the outer right breast anterior. 2. The coil biopsy marking clip appears displaced medially by approximately 5.7 cm. Final Assessment: Post Procedure Mammograms for Marker Placement Electronically Signed   By: Audie Pinto M.D.   On: 02/04/2021 12:40  MM RT BREAST BX W LOC DEV 1ST LESION IMAGE BX SPEC STEREO GUIDE  Addendum Date: 02/05/2021   ADDENDUM REPORT: 02/05/2021 13:29 ADDENDUM: Pathology revealed INTERMEDIATE/HIGH GRADE DUCTAL CARCINOMA IN SITU WITH NECROSIS AND CALCIFICATIONS of the RIGHT breast, outer,  anterior clip. This was found to be concordant by Dr. Audie Pinto, with excision recommended. Pathology revealed FIBROCYSTIC CHANGES WITH USUAL DUCTAL HYPERPLASIA AND CALCIFICATIONS, FOCAL ATYPICAL LOBULAR HYPERPLASIA of the RIGHT breast, outer, posterior coil clip. This was found to be concordant by Dr. Audie Pinto, with excision recommended. Pathology results were discussed with the patient by telephone. The patient reported doing well after the biopsies with tenderness at the sites. Post biopsy instructions and care were reviewed and questions were answered. The patient was encouraged to call The [facility] for any additional concerns. Recommend an additional RIGHT breast stereo biopsy for calcifications in between the two biopsied sites, for extent of disease. Due to post-biopsy bleeding/ bruising patient reported, biopsy requested for 1-2 weeks for resolution of hemorrhage. Consider bilateral breat MRI given high grade histology. The patient was referred to [facility] on February 10, 2021. Pathology results reported by Stacie Acres RN on 02/05/2021. Electronically Signed   By: Audie Pinto M.D.   On: 02/05/2021 13:29   Result Date:  02/05/2021 CLINICAL DATA:  65 year old female presenting for biopsy of calcifications in the right breast. EXAM: RIGHT BREAST STEREOTACTIC CORE NEEDLE BIOPSY x 2 COMPARISON:  Previous exams. FINDINGS: The patient and I discussed the procedure of stereotactic-guided biopsy including benefits and alternatives. We discussed the high likelihood of a successful procedure. We discussed the risks of the procedure including infection, bleeding, tissue injury, clip migration, and inadequate sampling. Informed written consent was given. The usual time out protocol was performed immediately prior to the procedure. 1. Using sterile technique and 1% Lidocaine as local anesthetic, under stereotactic guidance, a 9 gauge vacuum assisted device was used to perform core needle biopsy of calcifications in the outer right breast, anterior using a superior approach. Specimen radiograph was performed showing at least 2 specimens with calcifications. Specimens with calcifications are identified for pathology. Lesion quadrant: Lower outer quadrant At the conclusion of the procedure, an X tissue marker clip was deployed into the biopsy cavity. Follow-up 2-view mammogram was performed and dictated separately. 2. Using sterile technique and 1% Lidocaine as local anesthetic, under stereotactic guidance, a 9 gauge vacuum assisted device was used to perform core needle biopsy of calcifications in the outer right breast, posterior using a lateral approach. Specimen radiograph was performed showing 1 specimen with possible calcification. Specimens with calcifications are identified for pathology. Lesion quadrant: Upper outer quadrant At the conclusion of the procedure, a coil tissue marker clip was deployed into the biopsy cavity. Follow-up 2-view mammogram was performed and dictated separately. IMPRESSION: Stereotactic-guided biopsy of calcifications in the outer right breast, anterior and of calcifications in the outer right breast, posterior.  No apparent complications. Electronically Signed: By: Audie Pinto M.D. On: 02/04/2021 12:43  MM RT BREAST BX W LOC DEV EA AD LESION IMG BX SPEC STEREO GUIDE  Addendum Date: 02/05/2021   ADDENDUM REPORT: 02/05/2021 13:29 ADDENDUM: Pathology revealed INTERMEDIATE/HIGH GRADE DUCTAL CARCINOMA IN SITU WITH NECROSIS AND CALCIFICATIONS of the RIGHT breast, outer, anterior clip. This was found to be concordant by Dr. Audie Pinto, with excision recommended. Pathology revealed FIBROCYSTIC CHANGES WITH USUAL DUCTAL HYPERPLASIA AND CALCIFICATIONS, FOCAL ATYPICAL LOBULAR HYPERPLASIA of the RIGHT breast, outer, posterior coil clip. This was found to be concordant by Dr. Audie Pinto, with excision recommended. Pathology results were discussed with the patient by telephone. The patient reported doing well after the biopsies with tenderness at the sites. Post biopsy  instructions and care were reviewed and questions were answered. The patient was encouraged to call [facility] for any additional concerns. Recommend an additional RIGHT breast stereo biopsy for calcifications in between the two biopsied sites, for extent of disease. Due to post-biopsy bleeding/ bruising patient reported, biopsy requested for 1-2 weeks for resolution of hemorrhage. Consider bilateral breat MRI given high grade histology. The patient was referred to [facility] on February 10, 2021. Pathology results reported by Stacie Acres RN on 02/05/2021. Electronically Signed   By: Audie Pinto M.D.   On: 02/05/2021 13:29   Result Date: 02/05/2021 CLINICAL DATA:  65 year old female presenting for biopsy of calcifications in the right breast. EXAM: RIGHT BREAST STEREOTACTIC CORE NEEDLE BIOPSY x 2 COMPARISON:  Previous exams. FINDINGS: The patient and I discussed the procedure of stereotactic-guided biopsy including benefits and alternatives. We  discussed the high likelihood of a successful procedure. We discussed the risks of the procedure including infection, bleeding, tissue injury, clip migration, and inadequate sampling. Informed written consent was given. The usual time out protocol was performed immediately prior to the procedure. 1. Using sterile technique and 1% Lidocaine as local anesthetic, under stereotactic guidance, a 9 gauge vacuum assisted device was used to perform core needle biopsy of calcifications in the outer right breast, anterior using a superior approach. Specimen radiograph was performed showing at least 2 specimens with calcifications. Specimens with calcifications are identified for pathology. Lesion quadrant: Lower outer quadrant At the conclusion of the procedure, an X tissue marker clip was deployed into the biopsy cavity. Follow-up 2-view mammogram was performed and dictated separately. 2. Using sterile technique and 1% Lidocaine as local anesthetic, under stereotactic guidance, a 9 gauge vacuum assisted device was used to perform core needle biopsy of calcifications in the outer right breast, posterior using a lateral approach. Specimen radiograph was performed showing 1 specimen with possible calcification. Specimens with calcifications are identified for pathology. Lesion quadrant: Upper outer quadrant At the conclusion of the procedure, a coil tissue marker clip was deployed into the biopsy cavity. Follow-up 2-view mammogram was performed and dictated separately. IMPRESSION: Stereotactic-guided biopsy of calcifications in the outer right breast, anterior and of calcifications in the outer right breast, posterior. No apparent complications. Electronically Signed: By: Audie Pinto M.D. On: 02/04/2021 12:43     IMPRESSION/PLAN: Right breast DCIS  MRI is pending to determine if breast conserving surgery is realistic.  She understands that if she undergoes mastectomy she is unlikely that radiation will be indicated.   However in the setting of a breast conserving surgery she understands that radiation therapy would minimize the chance of a recurrence within her breast tissue.  She understands that radiation therapy would be optional and would not improve her life expectancy.  She is enthusiastic about breast conserving surgery and postoperative radiation is feasible based on her MRI.  It was a pleasure meeting the patient today. We discussed the risks, benefits, and side effects of radiotherapy. We discussed that radiation would take approximately 3-4 weeks to complete and that I would give the patient a few weeks to heal following surgery before starting treatment planning.   We spoke about acute effects including skin irritation and fatigue as well as much less common late effects including internal organ injury or irritation. We spoke about the latest technology that is used to minimize the risk of late effects for patients undergoing radiotherapy to the breast  or chest wall. No guarantees of treatment were given. The patient is enthusiastic about proceeding with treatment. I look forward to participating in the patient's care.  I will await her referral back to me for postoperative follow-up and eventual CT simulation/treatment planning.  On date of service, in total, I spent 45 minutes on this encounter. Patient was seen in person.   __________________________________________   Eppie Gibson, MD  This document serves as a record of services personally performed by Eppie Gibson, MD. It was created on her behalf by Roney Mans, a trained medical scribe. The creation of this record is based on the scribe's personal observations and the provider's statements to them. This document has been checked and approved by the attending provider.

## 2021-02-10 ENCOUNTER — Other Ambulatory Visit: Payer: Self-pay

## 2021-02-10 ENCOUNTER — Inpatient Hospital Stay: Payer: Medicare HMO | Attending: Hematology and Oncology | Admitting: Hematology and Oncology

## 2021-02-10 ENCOUNTER — Ambulatory Visit (HOSPITAL_BASED_OUTPATIENT_CLINIC_OR_DEPARTMENT_OTHER): Payer: Medicare HMO | Admitting: Genetic Counselor

## 2021-02-10 ENCOUNTER — Encounter: Payer: Self-pay | Admitting: *Deleted

## 2021-02-10 ENCOUNTER — Encounter: Payer: Self-pay | Admitting: Radiation Oncology

## 2021-02-10 ENCOUNTER — Inpatient Hospital Stay: Payer: Medicare HMO

## 2021-02-10 ENCOUNTER — Other Ambulatory Visit: Payer: Self-pay | Admitting: *Deleted

## 2021-02-10 ENCOUNTER — Ambulatory Visit
Admission: RE | Admit: 2021-02-10 | Discharge: 2021-02-10 | Disposition: A | Discharge status: Home / Self Care | Payer: Medicare HMO | Source: Ambulatory Visit | Attending: Radiation Oncology | Admitting: Radiation Oncology

## 2021-02-10 ENCOUNTER — Encounter: Payer: Self-pay | Admitting: General Practice

## 2021-02-10 ENCOUNTER — Encounter: Payer: Self-pay | Admitting: General Surgery

## 2021-02-10 DIAGNOSIS — D0511 Intraductal carcinoma in situ of right breast: Secondary | ICD-10-CM

## 2021-02-10 DIAGNOSIS — Z170 Estrogen receptor positive status [ER+]: Secondary | ICD-10-CM | POA: Diagnosis not present

## 2021-02-10 DIAGNOSIS — Z803 Family history of malignant neoplasm of breast: Secondary | ICD-10-CM

## 2021-02-10 DIAGNOSIS — Z8042 Family history of malignant neoplasm of prostate: Secondary | ICD-10-CM

## 2021-02-10 DIAGNOSIS — C50411 Malignant neoplasm of upper-outer quadrant of right female breast: Secondary | ICD-10-CM | POA: Diagnosis not present

## 2021-02-10 LAB — CMP (CANCER CENTER ONLY)
ALT: 31 U/L (ref 0–44)
AST: 25 U/L (ref 15–41)
Albumin: 4 g/dL (ref 3.5–5.0)
Alkaline Phosphatase: 98 U/L (ref 38–126)
Anion gap: 8 (ref 5–15)
BUN: 14 mg/dL (ref 8–23)
CO2: 28 mmol/L (ref 22–32)
Calcium: 9.2 mg/dL (ref 8.9–10.3)
Chloride: 101 mmol/L (ref 98–111)
Creatinine: 0.79 mg/dL (ref 0.44–1.00)
GFR, Estimated: >60 mL/min (ref >60)
Glucose, Bld: 208 mg/dL — ABNORMAL HIGH (ref 70–99)
Potassium: 4.5 mmol/L (ref 3.5–5.1)
Sodium: 137 mmol/L (ref 135–145)
Total Bilirubin: 0.6 mg/dL (ref 0.3–1.2)
Total Protein: 7.4 g/dL (ref 6.5–8.1)

## 2021-02-10 LAB — CBC WITH DIFFERENTIAL (CANCER CENTER ONLY)
Abs Immature Granulocytes: 0.02 10*3/uL (ref 0.00–0.07)
Basophils Absolute: 0.1 10*3/uL (ref 0.0–0.1)
Basophils Relative: 1 %
Eosinophils Absolute: 0.1 10*3/uL (ref 0.0–0.5)
Eosinophils Relative: 2 %
HCT: 43.7 % (ref 36.0–46.0)
Hemoglobin: 14.2 g/dL (ref 12.0–15.0)
Immature Granulocytes: 0 %
Lymphocytes Relative: 25 %
Lymphs Abs: 1.7 10*3/uL (ref 0.7–4.0)
MCH: 29.4 pg (ref 26.0–34.0)
MCHC: 32.5 g/dL (ref 30.0–36.0)
MCV: 90.5 fL (ref 80.0–100.0)
Monocytes Absolute: 0.4 10*3/uL (ref 0.1–1.0)
Monocytes Relative: 6 %
Neutro Abs: 4.4 10*3/uL (ref 1.7–7.7)
Neutrophils Relative %: 66 %
Platelet Count: 234 10*3/uL (ref 150–400)
RBC: 4.83 MIL/uL (ref 3.87–5.11)
RDW: 14 % (ref 11.5–15.5)
WBC Count: 6.6 10*3/uL (ref 4.0–10.5)
nRBC: 0 % (ref 0.0–0.2)

## 2021-02-10 LAB — GENETIC SCREENING ORDER

## 2021-02-10 NOTE — Assessment & Plan Note (Signed)
02/02/2021:Screening mammogram on 01/10/21 showed a possible mass and calcifications in the right breast. Diagnostic mammogram and [place] on 02/02/21 showed suspicious right breast calcifications and right breast cysts. Biopsy on 02/04/21 showed DCIS with necrosis and calcifications ER+ (95%)/ PR+ (30%).  Pathology review: I discussed with the patient the difference between DCIS and invasive breast cancer. It is considered a precancerous lesion. DCIS is classified as a 0. It is generally detected through mammograms as calcifications. We discussed the significance of grades and its impact on prognosis. We also discussed the importance of ER and PR receptors and their implications to adjuvant treatment options. Prognosis of DCIS dependence on grade, comedo necrosis. It is anticipated that if not treated, 20-30% of DCIS can develop into invasive breast cancer.  Recommendation: 1. Breast conserving surgery 2. Followed by adjuvant radiation therapy 3. Followed by antiestrogen therapy with tamoxifen 5 years  Tamoxifen counseling: We discussed the risks and benefits of tamoxifen. These include but not limited to insomnia, hot flashes, mood changes, vaginal dryness, and weight gain. Although rare, serious side effects including endometrial cancer, risk of blood clots were also discussed. We strongly believe that the benefits far outweigh the risks. Patient understands these risks and consented to starting treatment. Planned treatment duration is 5 years.  Return to clinic after surgery to discuss the final pathology report and come up with an adjuvant treatment plan.

## 2021-02-10 NOTE — Progress Notes (Signed)
[facility] Psychosocial Distress Screening Spiritual Care  Met with Carol [name] and her daughter in [facility] to introduce [facility] team/resources, reviewing distress screen per protocol.  The patient scored a 7 on the Psychosocial Distress Thermometer which indicates severe distress. Also assessed for distress and other psychosocial needs.   ONCBCN DISTRESS SCREENING 02/10/2021  Screening Type Initial Screening  Distress experienced in past week (1-10) 7  Emotional problem type Depression;Nervousness/Anxiety  Physical Problem type Pain;Nausea/vomiting;Sleep/insomnia;Tingling hands/feet  Referral to support programs Yes   Carol [name] that meeting team and learning scope of diagnosis and treatment in [facility] helped reduce her distress to a 4. She notes that her PCP is helping her manage her depression and anxiety. Her top stressors are helping to raise her son's children while dealing with the pain associated with back and knee problems.  Follow up needed: No. Carol [name] of ongoing [facility] team and programming availability and prefers to reach out as needed/desired.   [place], [place], [facility] Pager [phone] Voicemail [phone]

## 2021-02-11 ENCOUNTER — Encounter: Payer: Self-pay | Admitting: Genetic Counselor

## 2021-02-11 DIAGNOSIS — Z8042 Family history of malignant neoplasm of prostate: Secondary | ICD-10-CM | POA: Insufficient documentation

## 2021-02-11 DIAGNOSIS — Z803 Family history of malignant neoplasm of breast: Secondary | ICD-10-CM | POA: Insufficient documentation

## 2021-02-11 NOTE — Progress Notes (Signed)
REFERRING PROVIDER: Nicholas Lose, MD [address] [place],  [place] [address]  PRIMARY PROVIDER:  [name], MD  PRIMARY REASON FOR VISIT:  1. Ductal carcinoma in situ (DCIS) of right breast   2. Family history of prostate cancer   3. Family history of breast cancer      HISTORY OF PRESENT ILLNESS:   Carol [name], a 65 y.o. female, was seen for a [facility] cancer genetics consultation at the request of Dr. Lindi Adie due to a personal and family history of cancer.  Carol [name] presents to clinic today to discuss the possibility of a hereditary predisposition to cancer, genetic testing, and to further clarify her future cancer risks, as well as potential cancer risks for family members.   In July of 2022, at the age of 71, Carol [name] was diagnosed with ductal carcinoma in situ of the right breast. The tumor is ER and PR positive. The treatment plan includes surgery, radiation therapy, and antiestrogen therapy.    CANCER HISTORY:  Oncology History  Ductal carcinoma in situ (DCIS) of right breast  02/02/2021 Initial Diagnosis   Screening mammogram on 01/10/21 showed a possible mass and calcifications in the right breast. Diagnostic mammogram and [place] on 02/02/21 showed suspicious right breast calcifications and right breast cysts. Biopsy on 02/04/21 showed DCIS with necrosis and calcifications ER+ (95%)/ PR+ (30%).      RISK FACTORS:  Menarche was at age 66.  First live birth at age 68.  OCP use for approximately 0 years.  Ovaries intact: yes.  Hysterectomy: yes.  Menopausal status: postmenopausal.  HRT use: 0 years. Colonoscopy: yes;  2012 . Mammogram within the last year: yes.   Past Medical History:  Diagnosis Date   Anxiety    Arthritis    Balance problems    Chronic anxiety    Chronic depression    Chronic diarrhea    after gallbladder surgery   Chronic low back pain    Chronic seasonal allergic rhinitis    Complication of anesthesia    pt states  has difficulty with being put to sleep    Exogenous obesity    Falls    Family history of adverse reaction to anesthesia    pts daughter has N&V   Family history of breast cancer    Family history of prostate cancer    Fatigue    Hemorrhoids    History of blood clots    History of blood transfusion    2006   History of bronchitis    History of chicken pox    History of frequent urinary tract infections    History of gallstones    History of jaundice    History of kidney stones    History of measles as a child    History of mumps as a child    Hypothyroidism    Insomnia    Night sweats    Pneumonia    hx of    Polycythemia    PONV (postoperative nausea and vomiting)    Poor sleep    chronic poor quality sleep wit snoring, sleep study in Nov 2013 showed mild central sleep apnea with hypoxia felt due to narcotics, no significant OSA or RLS   Thyroid disease    Tinnitus    Type II diabetes mellitus ([place]) 11/2016    Past Surgical History:  Procedure Laterality Date   ABDOMINAL HYSTERECTOMY     APPENDECTOMY  1972   BACK SURGERY  BREAST BIOPSY Right    years ago- unsure when   [place]  11/2011   showed mild diverticulosis, internal hemorrhoids, and no polyps   ENDOSCOPY with esophageal dilatation  03/14/2016   KNEE ARTHROSCOPY Right 01/07/2016   Procedure: ARTHROSCOPY RIGHT KNEE, DEBRIDEMENT OF ARTHROFIBROSIS, AND EXAM UNDER ANESTHESIA;  Surgeon: Susa Day, MD;  Location: [facility];  Service: Orthopedics;  Laterality: Right;   KNEE ARTHROSCOPY Right 10/04/2017   Procedure: Right knee arthroscopy, evaluation under anesthesia, lysis of adhesions;  Surgeon: Susa Day, MD;  Location: [facility];  Service: Orthopedics;  Laterality: Right;  60 mins   KNEE SURGERY     L3/4  fusion  2004   L3/4 discectomy  2003   L4/5 discectomy  2001   L4/5 fusion  2002   left foot surgery      to repair break - 2002   left knee meniscectomy  2012   lumbar  spine ESI  12/2017   menicus tear     bilat;    right knee arthroscopy  12/2015   Dr. Tonita Cong   right knee meniscectomy  2012   TONSILLECTOMY     TOTAL KNEE ARTHROPLASTY Right 03/27/2015   Procedure: RIGHT TOTAL KNEE ARTHROPLASTY;  Surgeon: Susa Day, MD;  Location: [facility];  Service: Orthopedics;  Laterality: Right;   total knee replacement Right 03/2015   VAGINAL HYSTERECTOMY  1996   with ovaries intact, accidental bladder laceration   WRIST SURGERY  11/2016   Dr. Amedeo Plenty    Social History   Socioeconomic History   Marital status: Married    Spouse name: Not on file   Number of children: Not on file   Years of education: Not on file   Highest education level: Not on file  Occupational History   Not on file  Tobacco Use   Smoking status: Never   Smokeless tobacco: Never  Vaping Use   Vaping Use: Never used  Substance and Sexual Activity   Alcohol use: No   Drug use: No   Sexual activity: Yes    Birth control/protection: None  Other Topics Concern   Not on file  Social History Narrative   Not on file   Social Determinants of Health   Financial Resource Strain: Not on file  Food Insecurity: Not on file  Transportation Needs: Not on file  Physical Activity: Not on file  Stress: Not on file  Social Connections: Not on file     FAMILY HISTORY:  We obtained a detailed, 4-generation family history.  Significant diagnoses are listed below: Family History  Problem Relation Age of Onset   Hypertension Mother    COPD Mother    Diabetes Mother    Heart attack Father 70   Prostate cancer Maternal Uncle 42   Myasthenia gravis Daughter    Depression Daughter    Other Son        esophageal dysmotility, Peanut Allergy   Diabetes type II Other    Other Other        premature cardiovascular disease   Breast cancer Other 58       bilateral, mother's first cousin   Prostate cancer Other        dx 38s, maternal cousin's son   Carol [name] has one daughter (age 70) and  one son (age 50). She has one paternal half-sister (age 55). None of these relatives have had cancer.  Carol [name] mother died at age 21 without cancer.  There was one maternal aunt and one maternal uncle. Her uncle had prostate cancer diagnosed at age 56. There is also a maternal cousin's son who was diagnosed with prostate cancer in his 35s. Carol [name] maternal grandmother died at age 20 without cancer. Her maternal grandfather died younger than 33 without cancer. Her mother's first cousin was diagnosed with bilateral breast cancer at age 50.  Carol [name]'s father died at age 29 from a heart attack and did not have cancer. There was one paternal aunt and three paternal uncles. There is no known cancer among paternal aunts/uncles or paternal cousins. Ms. Stukes does not have information about her paternal grandparents.   Carol [name] is unaware of previous family history of genetic testing for hereditary cancer risks. Patient's maternal ancestors are of Native American ([place]) descent, and paternal ancestors are of [place] descent. There is no reported Ashkenazi Jewish ancestry. There is no known consanguinity.  GENETIC COUNSELING ASSESSMENT: Carol [name] is a 65 y.o. female with a personal and family history of cancer which is somewhat suggestive of a hereditary cancer syndrome and predisposition to cancer. We, therefore, discussed and recommended the following at today's visit.   DISCUSSION: We discussed that approximately 5-10% of breast cancer is hereditary, with most cases associated with the BRCA1 and BRCA2 genes. There are other genes that can be associated with hereditary breast cancer syndromes. These include ATM, CHEK2, PALB2, etc. We discussed that testing is beneficial for several reasons, including knowing about other cancer risks, identifying potential screening and risk-reduction options that may be appropriate, and to understand if other family members could be at risk for cancer and allow  them to undergo genetic testing.   We reviewed the characteristics, features and inheritance patterns of hereditary cancer syndromes. Although her personal and family history do not meet NCCN criteria for genetic testing, the American Society of Breast Surgeons recommends that genetic testing should be made available   We discussed with Carol [name] that the personal and family history does not meet insurance or NCCN criteria for genetic testing and, therefore, is not highly consistent with a familial hereditary cancer syndrome. We feel she is at low risk to harbor a gene mutation associated with such a condition. Carol [name] states that she is still interested in proceeding with genetic testing and understands that, although there may be an out of pocket cost for testing, the genetic testing laboratory ([facility]) will not charge her more than $100 for the test.  We therefore discussed genetic testing, including the appropriate family members to test, the process of testing, insurance coverage and turn-around-time for results. We discussed the implications of a negative, positive and/or variant of uncertain significant result. In order to get genetic test results in a timely manner so that Carol [name] can use these genetic test results for surgical decisions, we recommended Carol [name] pursue genetic testing for the [employer]. Once complete, we recommend Carol [name] pursue reflex genetic testing to the CancerNext-Expanded + RNAinsight gene panel.   The BRCAplus panel offered by [facility] and includes sequencing and deletion/duplication analysis for the following 8 genes: ATM, BRCA1, BRCA2, CDH1, CHEK2, PALB2, PTEN, and TP53. The CancerNext-Expanded + RNAinsight gene panel offered by [facility] and includes sequencing and rearrangement analysis for the following 77 genes: AIP, ALK, APC, ATM, AXIN2, BAP1, BARD1, BLM, BMPR1A, BRCA1, BRCA2, BRIP1, CDC73, CDH1, CDK4, CDKN1B, CDKN2A, CHEK2,  CTNNA1, DICER1, FANCC, FH, FLCN, GALNT12, KIF1B, LZTR1, MAX, MEN1, MET, MLH1, MSH2, MSH3, MSH6, MUTYH,  NBN, NF1, NF2, NTHL1, PALB2, PHOX2B, PMS2, POT1, PRKAR1A, PTCH1, PTEN, RAD51C, RAD51D, RB1, RECQL, RET, SDHA, SDHAF2, SDHB, SDHC, SDHD, SMAD4, SMARCA4, SMARCB1, SMARCE1, STK11, SUFU, TMEM127, TP53, TSC1, TSC2, VHL and XRCC2 (sequencing and deletion/duplication); EGFR, EGLN1, HOXB13, KIT, MITF, PDGFRA, POLD1 and POLE (sequencing only); EPCAM and GREM1 (deletion/duplication only). RNA data is routinely analyzed for use in variant interpretation for all genes.  PLAN: After considering the risks, benefits, and limitations, Carol [name] provided informed consent to pursue genetic testing and the blood sample was sent to [facility] for analysis of the BRCAplus and CancerNext-Expanded + RNAinsight panels. Results should be available within approximately one-two weeks' time, at which point they will be disclosed by telephone to Carol [name], as will any additional recommendations warranted by these results. Carol [name] will receive a summary of her genetic counseling visit and a copy of her results once available. This information will also be available in Epic.   Ms. Seelye questions were answered to her satisfaction today. Our contact information was provided should additional questions or concerns arise. Thank you for the referral and allowing [place] to share in the care of your patient.   Clint Guy, [place], [facility] Licensed, Certified Dispensing [email or web address] Phone: [phone]  The patient was seen for a total of 20 minutes in face-to-face genetic counseling. Patient was seen with her daughter, Angella. This patient was discussed with Drs. Magrinat, Lindi Adie and/or Burr Medico who agrees with the above.    _______________________________________________________________________ For Office Staff:  Number of people involved in session: 1 Was an Intern/ student involved with case: no

## 2021-02-15 DIAGNOSIS — M7542 Impingement syndrome of left shoulder: Secondary | ICD-10-CM | POA: Diagnosis not present

## 2021-02-15 DIAGNOSIS — R7303 Prediabetes: Secondary | ICD-10-CM | POA: Diagnosis not present

## 2021-02-15 DIAGNOSIS — D0511 Intraductal carcinoma in situ of right breast: Secondary | ICD-10-CM | POA: Diagnosis not present

## 2021-02-15 DIAGNOSIS — M7541 Impingement syndrome of right shoulder: Secondary | ICD-10-CM | POA: Diagnosis not present

## 2021-02-16 ENCOUNTER — Other Ambulatory Visit: Payer: Self-pay

## 2021-02-16 ENCOUNTER — Ambulatory Visit (HOSPITAL_COMMUNITY)
Admission: RE | Admit: 2021-02-16 | Discharge: 2021-02-16 | Disposition: A | Discharge status: Home / Self Care | Payer: Medicare HMO | Source: Ambulatory Visit | Attending: General Surgery | Admitting: General Surgery

## 2021-02-16 DIAGNOSIS — D0511 Intraductal carcinoma in situ of right breast: Secondary | ICD-10-CM | POA: Diagnosis not present

## 2021-02-16 DIAGNOSIS — C50911 Malignant neoplasm of unspecified site of right female breast: Secondary | ICD-10-CM | POA: Diagnosis not present

## 2021-02-16 MED ORDER — GADOBUTROL 1 MMOL/ML IV SOLN
9.0000 mL | Freq: Once | INTRAVENOUS | Status: AC | PRN
  Administered 2021-02-16: 9 mL via INTRAVENOUS

## 2021-02-17 ENCOUNTER — Ambulatory Visit
Admission: RE | Admit: 2021-02-17 | Discharge: 2021-02-17 | Disposition: A | Discharge status: Home / Self Care | Payer: Medicare HMO | Source: Ambulatory Visit | Attending: Internal Medicine | Admitting: Internal Medicine

## 2021-02-17 DIAGNOSIS — C50911 Malignant neoplasm of unspecified site of right female breast: Secondary | ICD-10-CM

## 2021-02-17 DIAGNOSIS — R921 Mammographic calcification found on diagnostic imaging of breast: Secondary | ICD-10-CM | POA: Diagnosis not present

## 2021-02-17 DIAGNOSIS — D0511 Intraductal carcinoma in situ of right breast: Secondary | ICD-10-CM | POA: Diagnosis not present

## 2021-02-18 ENCOUNTER — Encounter: Payer: Self-pay | Admitting: *Deleted

## 2021-02-18 ENCOUNTER — Telehealth: Payer: Self-pay | Admitting: *Deleted

## 2021-02-18 NOTE — Telephone Encounter (Signed)
Spoke with patient from [facility] 7/20 to follow up and assess navigation needs.  She is aware her additional bx shows DCIS and I have sent Dr. Barry Dienes and her nurse a message for next steps.  Encouraged her to call with any other questions or concerns. Patient verbalized understanding.

## 2021-02-19 ENCOUNTER — Telehealth: Payer: Self-pay | Admitting: Genetic Counselor

## 2021-02-19 DIAGNOSIS — J069 Acute upper respiratory infection, unspecified: Secondary | ICD-10-CM | POA: Diagnosis not present

## 2021-02-19 DIAGNOSIS — B9689 Other specified bacterial agents as the cause of diseases classified elsewhere: Secondary | ICD-10-CM | POA: Diagnosis not present

## 2021-02-19 DIAGNOSIS — J329 Chronic sinusitis, unspecified: Secondary | ICD-10-CM | POA: Diagnosis not present

## 2021-02-19 NOTE — Telephone Encounter (Signed)
Called to discuss Carol [name]'s genetic test results. She is unavailable to talk right now and requested that we call back on Monday.

## 2021-02-22 ENCOUNTER — Ambulatory Visit: Payer: Self-pay | Admitting: Genetic Counselor

## 2021-02-22 DIAGNOSIS — Z1379 Encounter for other screening for genetic and chromosomal anomalies: Secondary | ICD-10-CM | POA: Insufficient documentation

## 2021-02-22 NOTE — Telephone Encounter (Signed)
Revealed negative genetic testing. Discussed that we do not know why she has breast cancer or why there is cancer in the family. It is possible that there could be a mutation in a different gene that we are not testing, or our current technology may not be able to detect certain mutations. It will therefore be important for her to stay in contact with genetics to keep up with whether additional testing may be appropriate in the future.

## 2021-02-22 NOTE — Progress Notes (Signed)
HPI:  Carol [name] was previously seen in the [facility] clinic due to a personal and family history of cancer and concerns regarding a hereditary predisposition to cancer. Please refer to our prior cancer genetics clinic note for more information regarding our discussion, assessment and recommendations, at the time. Carol [name] recent genetic test results were disclosed to her, as were recommendations warranted by these results. These results and recommendations are discussed in more detail below.  CANCER HISTORY:  Oncology History  Ductal carcinoma in situ (DCIS) of right breast  02/02/2021 Initial Diagnosis   Screening mammogram on 01/10/21 showed a possible mass and calcifications in the right breast. Diagnostic mammogram and [place] on 02/02/21 showed suspicious right breast calcifications and right breast cysts. Biopsy on 02/04/21 showed DCIS with necrosis and calcifications ER+ (95%)/ PR+ (30%).     FAMILY HISTORY:  We obtained a detailed, 4-generation family history.  Significant diagnoses are listed below: Family History  Problem Relation Age of Onset   Hypertension Mother    COPD Mother    Diabetes Mother    Heart attack Father 61   Prostate cancer Maternal Uncle 52   Myasthenia gravis Daughter    Depression Daughter    Other Son        esophageal dysmotility, Peanut All[employer]   Diabetes type II Other    Other Other        premature cardiovascular disease   Breast cancer Other 16       bilateral, mother's first cousin   Prostate cancer Other        dx 35s, maternal cousin's son   Carol [name] has one daughter (age 52) and one son (age 46). She has one paternal half-sister (age 64). None of these relatives have had cancer.   Carol [name] mother died at age 72 without cancer. There was one maternal aunt and one maternal uncle. Her uncle had prostate cancer diagnosed at age 71. There is also a maternal cousin's son who was diagnosed with prostate cancer in his 46s. Ms.  [name] maternal grandmother died at age 34 without cancer. Her maternal grandfather died younger than 57 without cancer. Her mother's first cousin was diagnosed with bilateral breast cancer at age 40.   Carol [name]'s father died at age 39 from a heart attack and did not have cancer. There was one paternal aunt and three paternal uncles. There is no known cancer among paternal aunts/uncles or paternal cousins. Carol [name] does not have information about her paternal grandparents.    Carol [name] is unaware of previous family history of genetic testing for hereditary cancer risks. Patient's maternal ancestors are of Native American ([place]) descent, and paternal ancestors are of [place] descent. There is no reported Ashkenazi Jewish ancestry. There is no known consanguinity.  GENETIC TEST RESULTS: Genetic testing reported out on 02/18/2021 through the [facility] panel and 02/20/2021 through the [facility] + RNAinsight panel. No pathogenic variants were detected.   The BRCAplus panel offered by [facility] and includes sequencing and deletion/duplication analysis for the following 8 genes: ATM, BRCA1, BRCA2, CDH1, CHEK2, PALB2, PTEN, and TP53. The CancerNext-Expanded + RNAinsight gene panel offered by [employer] and includes sequencing and rearrangement analysis for the following 77 genes: AIP, ALK, APC, ATM, AXIN2, BAP1, BARD1, BLM, BMPR1A, BRCA1, BRCA2, BRIP1, CDC73, CDH1, CDK4, CDKN1B, CDKN2A, CHEK2, CTNNA1, DICER1, FANCC, FH, FLCN, GALNT12, KIF1B, LZTR1, MAX, MEN1, MET, MLH1, MSH2, MSH3, MSH6, MUTYH, NBN, NF1, NF2, NTHL1, PALB2, PHOX2B, PMS2, POT1, PRKAR1A,  PTCH1, PTEN, RAD51C, RAD51D, RB1, RECQL, RET, SDHA, SDHAF2, SDHB, SDHC, SDHD, SMAD4, SMARCA4, SMARCB1, SMARCE1, STK11, SUFU, TMEM127, TP53, TSC1, TSC2, VHL and XRCC2 (sequencing and deletion/duplication); EGFR, EGLN1, HOXB13, KIT, MITF, PDGFRA, POLD1 and POLE (sequencing only); EPCAM and GREM1 (deletion/duplication only). RNA data  is routinely analyzed for use in variant interpretation for all genes. The test report will be scanned into EPIC and located under the Molecular Pathology section of the Results Review tab.  A portion of the [name] report is included below for reference.     We discussed with Carol [name] that because current genetic testing is not perfect, it is possible there may be a gene mutation in one of these genes that current testing cannot detect, but that chance is small.  We also discussed that there could be another gene that has not yet been discovered, or that we have not yet tested, that is responsible for the cancer diagnoses in the family. It is also possible there is a hereditary cause for the cancer in the family that Carol [name] did not inherit and therefore was not identified in her testing.  Therefore, it is important to remain in touch with cancer genetics in the future so that we can continue to offer Carol [name] the most up to date genetic testing.    ADDITIONAL GENETIC TESTING: We discussed with Carol [name] that her genetic testing was fairly extensive.  If there are genes identified to increase cancer risk that can be analyzed in the future, we would be happy to discuss and coordinate this testing at that time.    CANCER SCREENING RECOMMENDATIONS: Carol [name] is considered negative (normal).  This means that we have not identified a hereditary cause for her personal and family history of cancer at this time. Most cancers happen by chance and this negative test suggests that her personal history of cancer may fall into this category.    While reassuring, this does not definitively rule out a hereditary predisposition to cancer. It is still possible that there could be genetic mutations that are undetectable by current technology. There could be genetic mutations in genes that have not been tested or identified to increase cancer risk.  Therefore, it is recommended she continue to follow  the cancer management and screening guidelines provided by her oncology and primary healthcare provider.   An individual's cancer risk and medical management are not determined by genetic test results alone. Overall cancer risk assessment incorporates additional factors, including personal medical history, family history, and any available genetic information that may [name] in a personalized plan for cancer prevention and surveillance.  RECOMMENDATIONS FOR FAMILY MEMBERS:  Individuals in this family might be at some increased risk of developing cancer, over the general population risk, simply due to the family history of cancer.  We recommended women in this family have a yearly mammogram beginning at age 38, or 13 years younger than the earliest onset of cancer, an annual clinical breast exam, and perform monthly breast self-exams. Women in this family should also have a gynecological exam as recommended by their primary provider. All family members should be referred for colonoscopy starting at age 90.  FOLLOW-UP: Lastly, we discussed with Carol [name] that cancer genetics is a rapidly advancing field and it is possible that new genetic tests will be appropriate for her and/or her family members in the future. We encouraged her to remain in contact with cancer genetics on an annual basis so we can update her  personal and family histories and let her know of advances in cancer genetics that may benefit this family.   Our contact number was provided. Carol [name] questions were answered to her satisfaction, and she knows she is welcome to call us at anytime with additional questions or concerns.   Clint Guy, MS, [facility] Genetic Counselor [email or web address] Phone: [phone]

## 2021-02-24 ENCOUNTER — Other Ambulatory Visit: Payer: Self-pay | Admitting: General Surgery

## 2021-02-24 ENCOUNTER — Encounter: Payer: Self-pay | Admitting: *Deleted

## 2021-02-24 ENCOUNTER — Telehealth: Payer: Self-pay | Admitting: *Deleted

## 2021-02-24 DIAGNOSIS — C50411 Malignant neoplasm of upper-outer quadrant of right female breast: Secondary | ICD-10-CM

## 2021-02-24 NOTE — Telephone Encounter (Signed)
Sent messages x2 to [facility] Dr. Marlowe Aschoff office for sx date update.

## 2021-02-26 ENCOUNTER — Telehealth: Payer: Self-pay | Admitting: Hematology and Oncology

## 2021-02-26 ENCOUNTER — Other Ambulatory Visit: Payer: Self-pay | Admitting: General Surgery

## 2021-02-26 ENCOUNTER — Encounter: Payer: Self-pay | Admitting: *Deleted

## 2021-02-26 DIAGNOSIS — C50411 Malignant neoplasm of upper-outer quadrant of right female breast: Secondary | ICD-10-CM

## 2021-02-26 DIAGNOSIS — Z170 Estrogen receptor positive status [ER+]: Secondary | ICD-10-CM

## 2021-02-26 DIAGNOSIS — D0511 Intraductal carcinoma in situ of right breast: Secondary | ICD-10-CM

## 2021-02-26 NOTE — Telephone Encounter (Signed)
Scheduled appt per 8/5 sch msg. Pt aware.

## 2021-03-01 DIAGNOSIS — F331 Major depressive disorder, recurrent, moderate: Secondary | ICD-10-CM | POA: Diagnosis not present

## 2021-03-01 DIAGNOSIS — J189 Pneumonia, unspecified organism: Secondary | ICD-10-CM | POA: Diagnosis not present

## 2021-03-01 DIAGNOSIS — G4700 Insomnia, unspecified: Secondary | ICD-10-CM | POA: Diagnosis not present

## 2021-03-01 DIAGNOSIS — F411 Generalized anxiety disorder: Secondary | ICD-10-CM | POA: Diagnosis not present

## 2021-03-02 ENCOUNTER — Encounter: Payer: Self-pay | Admitting: *Deleted

## 2021-03-03 ENCOUNTER — Telehealth: Payer: Self-pay | Admitting: *Deleted

## 2021-03-03 NOTE — Telephone Encounter (Signed)
Return phone call but had to leave voicemail for a call back.

## 2021-03-04 DIAGNOSIS — J069 Acute upper respiratory infection, unspecified: Secondary | ICD-10-CM | POA: Diagnosis not present

## 2021-03-04 DIAGNOSIS — J189 Pneumonia, unspecified organism: Secondary | ICD-10-CM | POA: Diagnosis not present

## 2021-03-15 NOTE — Pre-Procedure Instructions (Signed)
Surgical Instructions    Your procedure is scheduled on Thursday, September 1st.  Report to [facility] Main Entrance "A" at 11:30 A.M., then check in with the Admitting office.  Call this number if you have problems the morning of surgery:  [phone]   If you have any questions prior to your surgery date call [phone]: Open Monday-Friday 8am-4pm    Remember:  Do not eat after midnight the night before your surgery  You may drink clear liquids until 10:30 a.m. the morning of your surgery.   Clear liquids allowed are: Water, Non-Citrus Juices (without pulp), Carbonated Beverages, Clear Tea, Black Coffee Only, and Gatorade    Take these medicines the morning of surgery with A SIP OF WATER  baclofen (LIORESAL)  levothyroxine (SYNTHROID) oxybutynin (DITROPAN) venlafaxine XR (EFFEXOR-XR)    Take these medications as needed: ALPRAZolam (XANAX)  ondansetron (ZOFRAN-ODT)  oxyCODONE (OXY IR/ROXICODONE)   As of today, STOP taking any Aspirin (unless otherwise instructed by your surgeon) Aleve, Naproxen, Ibuprofen, Motrin, Advil, Goody's, BC's, all herbal medications, fish oil, and all vitamins.          WHAT DO I DO ABOUT MY DIABETES MEDICATION?  The day of surgery, do not take other diabetes injectables, semaglutide (OZEMPIC).   HOW TO MANAGE YOUR DIABETES BEFORE AND AFTER SURGERY  Why is it important to control my blood sugar before and after surgery? Improving blood sugar levels before and after surgery helps healing and can limit problems. A way of improving blood sugar control is eating a healthy diet by:  Eating less sugar and carbohydrates  Increasing activity/exercise  Talking with your doctor about reaching your blood sugar goals High blood sugars (greater than 180 mg/dL) can raise your risk of infections and slow your recovery, so you will need to focus on controlling your diabetes during the weeks before surgery. Make sure that the doctor who takes care of your  diabetes knows about your planned surgery including the date and location.  How do I manage my blood sugar before surgery? Check your blood sugar at least 4 times a day, starting 2 days before surgery, to make sure that the level is not too high or low.  Check your blood sugar the morning of your surgery when you wake up and every 2 hours until you get to the Short Stay unit.  If your blood sugar is less than 70 mg/dL, you will need to treat for low blood sugar: Do not take insulin. Treat a low blood sugar (less than 70 mg/dL) with  cup of clear juice (cranberry or apple), 4 glucose tablets, OR glucose gel. Recheck blood sugar in 15 minutes after treatment (to make sure it is greater than 70 mg/dL). If your blood sugar is not greater than 70 mg/dL on recheck, call [phone] for further instructions. Report your blood sugar to the short stay nurse when you get to Short Stay.  If you are admitted to the hospital after surgery: Your blood sugar will be checked by the staff and you will probably be given insulin after surgery (instead of oral diabetes medicines) to make sure you have good blood sugar levels. The goal for blood sugar control after surgery is 80-180 mg/dL.             Do NOT Smoke (Tobacco/Vaping) or drink Alcohol 24 hours prior to your procedure.  If you use a CPAP at night, you may bring all equipment for your overnight stay.   Contacts, glasses, piercing's, hearing  aid's, dentures or partials may not be worn into surgery, please bring cases for these belongings.    For patients admitted to the hospital, discharge time will be determined by your treatment team.   Patients discharged the day of surgery will not be allowed to drive home, and someone needs to stay with them for 24 hours.  ONLY 1 SUPPORT PERSON MAY BE PRESENT WHILE YOU ARE IN SURGERY. IF YOU ARE TO BE ADMITTED ONCE YOU ARE IN YOUR ROOM YOU WILL BE ALLOWED TWO (2) VISITORS.  Minor children may have two parents  present. Special consideration for safety and communication needs will be reviewed on a case by case basis.   Special instructions:   [facility]- Preparing For Surgery  Before surgery, you can play an important role. Because skin is not sterile, your skin needs to be as free of germs as possible. You can reduce the number of germs on your skin by washing with CHG (chlorahexidine gluconate) Soap before surgery.  CHG is an antiseptic cleaner which kills germs and bonds with the skin to continue killing germs even after washing.    Oral Hygiene is also important to reduce your risk of infection.  Remember - BRUSH YOUR TEETH THE MORNING OF SURGERY WITH YOUR REGULAR TOOTHPASTE  Please do not use if you have an allergy to CHG or antibacterial soaps. If your skin becomes reddened/irritated stop using the CHG.  Do not shave (including legs and underarms) for at least 48 hours prior to first CHG shower. It is OK to shave your face.  Please follow these instructions carefully.   Shower the NIGHT BEFORE SURGERY and the MORNING OF SURGERY  If you chose to wash your hair, wash your hair first as usual with your normal shampoo.  After you shampoo, rinse your hair and body thoroughly to remove the shampoo.  Use CHG Soap as you would any other liquid soap. You can apply CHG directly to the skin and wash gently with a scrungie or a clean washcloth.   Apply the CHG Soap to your body ONLY FROM THE NECK DOWN.  Do not use on open wounds or open sores. Avoid contact with your eyes, ears, mouth and genitals (private parts). Wash Face and genitals (private parts)  with your normal soap.   Wash thoroughly, paying special attention to the area where your surgery will be performed.  Thoroughly rinse your body with warm water from the neck down.  DO NOT shower/wash with your normal soap after using and rinsing off the CHG Soap.  Pat yourself dry with a CLEAN TOWEL.  Wear CLEAN PAJAMAS to bed the night before  surgery  Place CLEAN SHEETS on your bed the night before your surgery  DO NOT SLEEP WITH PETS.   Day of Surgery: Shower with CHG soap. Do not wear jewelry, make up, nail polish, gel polish, artificial nails, or any other type of covering on natural nails including finger and toenails. If patients have artificial nails, gel coating, etc. that need to be removed by a nail salon please have this removed prior to surgery. Surgery may need to be canceled/delayed if the surgeon/ anesthesia feels like the patient is unable to be adequately monitored. Do not wear lotions, powders, perfumes, or deodorant. Do not shave 48 hours prior to surgery.   Do not bring valuables to the hospital. [facility] is not responsible for any belongings or valuables. Wear Clean/Comfortable clothing the morning of surgery Remember to brush your teeth  WITH YOUR REGULAR TOOTHPASTE.   Please read over the following fact sheets that you were given.

## 2021-03-16 ENCOUNTER — Other Ambulatory Visit: Payer: Self-pay

## 2021-03-16 ENCOUNTER — Encounter (HOSPITAL_COMMUNITY): Payer: Self-pay

## 2021-03-16 ENCOUNTER — Encounter (HOSPITAL_COMMUNITY)
Admission: RE | Admit: 2021-03-16 | Discharge: 2021-03-16 | Disposition: A | Discharge status: Home / Self Care | Payer: Medicare HMO | Source: Ambulatory Visit | Attending: General Surgery | Admitting: General Surgery

## 2021-03-16 DIAGNOSIS — Z6833 Body mass index (BMI) 33.0-33.9, adult: Secondary | ICD-10-CM | POA: Insufficient documentation

## 2021-03-16 DIAGNOSIS — E118 Type 2 diabetes mellitus with unspecified complications: Secondary | ICD-10-CM | POA: Insufficient documentation

## 2021-03-16 DIAGNOSIS — Z79899 Other long term (current) drug therapy: Secondary | ICD-10-CM | POA: Insufficient documentation

## 2021-03-16 DIAGNOSIS — Z794 Long term (current) use of insulin: Secondary | ICD-10-CM | POA: Insufficient documentation

## 2021-03-16 DIAGNOSIS — C50911 Malignant neoplasm of unspecified site of right female breast: Secondary | ICD-10-CM | POA: Insufficient documentation

## 2021-03-16 DIAGNOSIS — Z01818 Encounter for other preprocedural examination: Secondary | ICD-10-CM | POA: Diagnosis not present

## 2021-03-16 DIAGNOSIS — E039 Hypothyroidism, unspecified: Secondary | ICD-10-CM | POA: Insufficient documentation

## 2021-03-16 DIAGNOSIS — E669 Obesity, unspecified: Secondary | ICD-10-CM | POA: Diagnosis not present

## 2021-03-16 LAB — GLUCOSE, CAPILLARY: Glucose-Capillary: 119 mg/dL — ABNORMAL HIGH (ref 70–99)

## 2021-03-16 LAB — BASIC METABOLIC PANEL
Anion gap: 7 (ref 5–15)
BUN: 18 mg/dL (ref 8–23)
CO2: 28 mmol/L (ref 22–32)
Calcium: 8.9 mg/dL (ref 8.9–10.3)
Chloride: 102 mmol/L (ref 98–111)
Creatinine, Ser: 0.83 mg/dL (ref 0.44–1.00)
GFR, Estimated: >60 mL/min (ref >60)
Glucose, Bld: 92 mg/dL (ref 70–99)
Potassium: 4 mmol/L (ref 3.5–5.1)
Sodium: 137 mmol/L (ref 135–145)

## 2021-03-16 LAB — CBC
HCT: 44.6 % (ref 36.0–46.0)
Hemoglobin: 14.5 g/dL (ref 12.0–15.0)
MCH: 29.7 pg (ref 26.0–34.0)
MCHC: 32.5 g/dL (ref 30.0–36.0)
MCV: 91.4 fL (ref 80.0–100.0)
Platelets: 268 10*3/uL (ref 150–400)
RBC: 4.88 MIL/uL (ref 3.87–5.11)
RDW: 13.8 % (ref 11.5–15.5)
WBC: 11.8 10*3/uL — ABNORMAL HIGH (ref 4.0–10.5)
nRBC: 0 % (ref 0.0–0.2)

## 2021-03-16 LAB — HEMOGLOBIN A1C
Hgb A1c MFr Bld: 5.9 % — ABNORMAL HIGH (ref 4.8–5.6)
Mean Plasma Glucose: 122.63 mg/dL

## 2021-03-16 NOTE — Progress Notes (Addendum)
PCP - Dr. Rachell Cipro Cardiologist - Dr. Lovena Le (seen once)  Chest x-ray - n/a EKG - 03/16/21 Stress Test - denies ECHO - 04/10/19 Cardiac Cath - denies  Sleep Study - denies CPAP - denies  Fasting Blood Sugar - 81-120 Checks Blood Sugar __3__ times a day CBG at PAT; 119 Last A1C reported to be 4.9 in 08/2020. Will collect A1C today.  Blood Thinner Instructions: n/a Aspirin Instructions: n/a  ERAS Protcol - Pt to stop clear liquids by 1030 DOS. PRE-SURGERY Ensure or G2- none ordered  COVID TEST- Not indicated. Posted as amb surgery.   Anesthesia review: Yes, EKG review  Patient denies shortness of breath, fever, cough and chest pain at PAT appointment   All instructions explained to the patient, with a verbal understanding of the material. Patient agrees to go over the instructions while at home for a better understanding. Patient also instructed to self quarantine after being tested for COVID-19. The opportunity to ask questions was provided.

## 2021-03-17 NOTE — Anesthesia Preprocedure Evaluation (Addendum)
Anesthesia Evaluation  Patient identified by MRN, date of birth, ID band Patient awake    Reviewed: Allergy & Precautions, NPO status , Patient's Chart, lab work & pertinent test results  History of Anesthesia Complications (+) PONV and history of anesthetic complications  Airway Mallampati: II  TM Distance: >3 FB Neck ROM: Full    Dental no notable dental hx.    Pulmonary neg pulmonary ROS,    Pulmonary exam normal breath sounds clear to auscultation       Cardiovascular negative cardio ROS Normal cardiovascular exam Rhythm:Regular Rate:Normal     Neuro/Psych PSYCHIATRIC DISORDERS Anxiety Depression negative neurological ROS     GI/Hepatic negative GI ROS, Neg liver ROS,   Endo/Other  diabetesHypothyroidism obesity  Renal/GU negative Renal ROS  negative genitourinary   Musculoskeletal  (+) Arthritis , Chronic low back pain   Abdominal   Peds negative pediatric ROS (+)  Hematology negative hematology ROS (+)   Anesthesia Other Findings   Reproductive/Obstetrics negative OB ROS                          Anesthesia Physical Anesthesia Plan  ASA: 3  Anesthesia Plan: General   Post-op Pain Management:    Induction: Intravenous  PONV Risk Score and Plan: 4 or greater and Midazolam, Treatment may vary due to age or medical condition, Scopolamine patch - Pre-op, Ondansetron and Dexamethasone  Airway Management Planned: LMA  Additional Equipment: None  Intra-op Plan:   Post-operative Plan: Extubation in OR  Informed Consent: I have reviewed the patients History and Physical, chart, labs and discussed the procedure including the risks, benefits and alternatives for the proposed anesthesia with the patient or authorized representative who has indicated his/her understanding and acceptance.     Dental advisory given  Plan Discussed with: Anesthesiologist and CRNA  Anesthesia Plan  Comments: (PAT note written 03/17/2021 by Myra Gianotti, PA-C. )      Anesthesia Quick Evaluation

## 2021-03-17 NOTE — Progress Notes (Signed)
Anesthesia Chart Review:  Case: [id number] Date/Time: 03/25/21 1315   Procedure: RIGHT BREAST LUMPECTOMY WITH RADIOACTIVE SEED LOCALIZATION X2 (Right: Breast)   Anesthesia type: General   Pre-op diagnosis: RIGHT BREAST CANCER   Location: [facility] ROOM 09 / [facility]   Surgeons: Stark Klein, MD       DISCUSSION: Patient is a 65 year old female scheduled for the above procedure.  History includes never smoker, postoperative N/V, DM2, polycythemia with blood clots (DVT > 10 years ago), right breast cancer (02/04/21 right breast biopsy DCIS, focal atypical lobular hyperplasia; 02/17/21 right breast biopsy: DCIS), hypothyroidism, falls, chronic low back pain, back surgery (including posterolateral fusion L4-5 02/25/02; removal of hardware L4-5, redo decompression L3-4 with TLIF 08/06/03; removal hardware L3, L2-4 decompression, augmentation fusion left L3-4 10/25/04), knee surgery (right TKA 03/27/15).  Reports "difficulty being put to sleep".  BMI is consistent with obesity.  RSL 03/23/2021 at 9 AM. Her preoperative labs show and normal CBC and BMET except elevated WBC 11.8.  She denied shortness of breath, cough, fever, chest pain at PAT RN visit.  Anesthesia team to evaluate on the day of surgery.   VS: BP (!) 144/85   Pulse 83   Temp 36.9 C   Resp 17   Ht 5' 6.5" (1.689 m)   Wt 95 kg   SpO2 98%   BMI 33.29 kg/m    PROVIDERS: [name], MD is PCP She is not followed routinely by cardiology.  She saw Cristopher Peru, MD 09/17/2015 for evaluation of near syncope with underlying history of chronic diarrhea, depression and sleep difficulties.  Echo and event monitor recommended.  2017 echo was normal except for mild diastolic dysfunction.  Event monitor showed sinus rhythm-sinus tachycardia with no atrial or ventricular arrhythmias and no pauses noted.   LABS: Labs reviewed: Acceptable for surgery. (all labs ordered are listed, but only abnormal results are displayed)  Labs Reviewed  GLUCOSE,  CAPILLARY - Abnormal; Notable for the following components:      Result Value   Glucose-Capillary 119 (*)    All other components within normal limits  HEMOGLOBIN A1C - Abnormal; Notable for the following components:   Hgb A1c MFr Bld 5.9 (*)    All other components within normal limits  CBC - Abnormal; Notable for the following components:   WBC 11.8 (*)    All other components within normal limits  BASIC METABOLIC PANEL     IMAGES: CT T-spine 12/28/20: IMPRESSION: 1. No evidence of acute thoracic spine injury. 2. Thoracic spondylosis with endplate osteophyte formation, similar to previous chest CT. No large disc herniation or high-grade spinal stenosis identified.    EKG: 03/16/21: Normal sinus rhythm with sinus arrhythmia Septal infarct , age undetermined Abnormal ECG Confirmed by Thompson Grayer ([id number]) on 03/16/2021 9:20:56 PM - Overall, I thnk EKG appears stable when compared to 09/27/17 tracing.   CV: Echo 04/10/19: IMPRESSIONS   1. The left ventricle has normal systolic function with an ejection  fraction of 60-65%. The cavity size was normal. Left ventricular diastolic  Doppler parameters are consistent with impaired relaxation. No evidence of  left ventricular regional wall  motion abnormalities.   2. The right ventricle has normal systolic function. The cavity was  normal. There is No increase in right ventricular wall thickness.   3. Left atrial size was normal.   4. Right atrial size was normal.   5. No evidence of mitral valve stenosis.   6. The aortic valve is tricuspid. Aortic  valve regurgitation was not  visualized by color flow Doppler.   7. The pulmonic valve was grossly normal. Pulmonic valve regurgitation is  not visualized by color flow Doppler.   8. The aorta is normal unless otherwise noted.   Cardiac event monitor 2/27/-17-3/28/17: Study Highlights 1. NSR 2. Sinus tachycardia 3. Noise artifact 4. No atrial or ventricular arrhythmias 5. No pauses  noted  Past Medical History:  Diagnosis Date   Anxiety    Arthritis    Balance problems    Chronic anxiety    Chronic depression    Chronic diarrhea    after gallbladder surgery   Chronic low back pain    Chronic seasonal allergic rhinitis    Complication of anesthesia    pt states has difficulty with being put to sleep    Exogenous obesity    Falls    Family history of adverse reaction to anesthesia    pts daughter has N&V   Family history of breast cancer    Family history of prostate cancer    Fatigue    Hemorrhoids    History of blood clots    History of blood transfusion    2006   History of bronchitis    History of chicken pox    History of frequent urinary tract infections    History of gallstones    History of jaundice    History of kidney stones    History of measles as a child    History of mumps as a child    Hypothyroidism    Insomnia    Night sweats    Pneumonia    hx of    Polycythemia    PONV (postoperative nausea and vomiting)    Poor sleep    chronic poor quality sleep wit snoring, sleep study in Nov 2013 showed mild central sleep apnea with hypoxia felt due to narcotics, no significant OSA or RLS   Thyroid disease    Tinnitus    Type II diabetes mellitus ([place]) 11/2016    Past Surgical History:  Procedure Laterality Date   ABDOMINAL HYSTERECTOMY     APPENDECTOMY  1972   BACK SURGERY     BREAST BIOPSY Right    years ago- unsure when   CHOLECYSTECTOMY  1987   COLONOSCOPY  11/2011   showed mild diverticulosis, internal hemorrhoids, and no polyps   ENDOSCOPY with esophageal dilatation  03/14/2016   KNEE ARTHROSCOPY Right 01/07/2016   Procedure: ARTHROSCOPY RIGHT KNEE, DEBRIDEMENT OF ARTHROFIBROSIS, AND EXAM UNDER ANESTHESIA;  Surgeon: Susa Day, MD;  Location: [facility];  Service: Orthopedics;  Laterality: Right;   KNEE ARTHROSCOPY Right 10/04/2017   Procedure: Right knee arthroscopy, evaluation under anesthesia, lysis of adhesions;   Surgeon: Susa Day, MD;  Location: [facility];  Service: Orthopedics;  Laterality: Right;  60 mins   KNEE SURGERY     L3/4  fusion  2004   L3/4 discectomy  2003   L4/5 discectomy  2001   L4/5 fusion  2002   left foot surgery      to repair break - 2002   left knee meniscectomy  2012   lumbar spine ESI  12/2017   menicus tear     bilat;    right knee arthroscopy  12/2015   Dr. Tonita Cong   right knee meniscectomy  2012   TONSILLECTOMY     TOTAL KNEE ARTHROPLASTY Right 03/27/2015   Procedure: RIGHT TOTAL KNEE ARTHROPLASTY;  Surgeon: Susa Day, MD;  Location: [facility];  Service: Orthopedics;  Laterality: Right;   total knee replacement Right 03/2015   VAGINAL HYSTERECTOMY  1996   with ovaries intact, accidental bladder laceration   WRIST SURGERY  11/2016   Dr. Amedeo Plenty    MEDICATIONS:  ALPRAZolam Duanne Moron) 0.25 MG tablet   Ascorbic Acid (VITAMIN C) 1000 MG tablet   baclofen (LIORESAL) 10 MG tablet   Biotin 10 MG CAPS   levothyroxine (SYNTHROID) 88 MCG tablet   Multiple Vitamins-Minerals (CENTRUM SILVER PO)   ondansetron (ZOFRAN-ODT) 8 MG disintegrating tablet   oxybutynin (DITROPAN) 5 MG tablet   oxyCODONE (OXY IR/ROXICODONE) 5 MG immediate release tablet   Semaglutide,0.25 or 0.'5MG'$ /DOS, (OZEMPIC, 0.25 OR 0.5 MG/DOSE,) 2 MG/1.5ML SOPN   traZODone (DESYREL) 50 MG tablet   venlafaxine XR (EFFEXOR-XR) 75 MG 24 hr capsule   No current facility-administered medications for this encounter.    Myra Gianotti, PA-C Surgical Short Stay/Anesthesiology [facility] Phone [phone] [facility] Phone [phone] 03/17/2021 4:56 PM

## 2021-03-19 DIAGNOSIS — R0982 Postnasal drip: Secondary | ICD-10-CM | POA: Diagnosis not present

## 2021-03-19 DIAGNOSIS — J069 Acute upper respiratory infection, unspecified: Secondary | ICD-10-CM | POA: Diagnosis not present

## 2021-03-23 ENCOUNTER — Other Ambulatory Visit: Payer: Self-pay

## 2021-03-23 ENCOUNTER — Ambulatory Visit
Admission: RE | Admit: 2021-03-23 | Discharge: 2021-03-23 | Disposition: A | Discharge status: Home / Self Care | Payer: Medicare HMO | Source: Ambulatory Visit | Attending: General Surgery | Admitting: General Surgery

## 2021-03-23 DIAGNOSIS — D0511 Intraductal carcinoma in situ of right breast: Secondary | ICD-10-CM | POA: Diagnosis not present

## 2021-03-23 DIAGNOSIS — C50411 Malignant neoplasm of upper-outer quadrant of right female breast: Secondary | ICD-10-CM

## 2021-03-23 DIAGNOSIS — Z170 Estrogen receptor positive status [ER+]: Secondary | ICD-10-CM

## 2021-03-24 NOTE — H&P (Signed)
REFERRING PHYSICIAN:  Dr. Dimas Aguas   PROVIDER:  Georgianne Fick, MD   Care Team: Patient Care Team: [name], MD as PCP - General (Family Medicine)    MRN: [id number] DOB: 02/26/1956    Subjective    Chief Complaint: new breast cancer      History of Present Illness: Carol [name] is a 65 y.o. female who is seen today as an office consultation at the request of Dr. Dimas Aguas for evaluation of new breast cancer.     Pt is a 65 yo F who presents for consultation at the request of Dr. Dimas Aguas for a new right breast cancer 01/2021.  She was found to have screening detected left breast calcifications and asymmetry.  The calcs were 11 cm in greatest dimension in the upper outer quadrant.  These calcifications extended from the lateral retroareolar location posteriorly to the mid breast.  She had biopsies of the farthest extent of both of these locations and the anterior biopsy showed intermediate to high grade DCIS with necrosis and calcifications.  This was ER and PR positive.  There was no mass associated with this.  The posterior aspect was [facility] with fibrocystic changes with ALH and calcifications.     Of note, there was significant displacement of one of the clips.     She is on disability for her back pain.  She has not had cancer before, but she has a maternal aunt with breast cancer and a maternal uncle with prostate cancer.     She had menarche at age 50.  She had menopause in the late 41s.  She is a G2P2 with first child at age 32.  She is up to date with bone density and had colonoscopy 10 years ago.       Diagnostic mammogram: 02/02/21   EXAM: DIGITAL DIAGNOSTIC UNILATERAL RIGHT MAMMOGRAM WITH TOMOSYNTHESIS AND CAD; ULTRASOUND RIGHT BREAST LIMITED   TECHNIQUE: Right digital diagnostic mammography and breast tomosynthesis was performed. The images were evaluated with computer-aided detection.; Targeted ultrasound examination of the right breast was performed    COMPARISON:  Previous exam(s).   ACR Breast Density Category c: The breast tissue is heterogeneously dense, which may obscure small masses.   FINDINGS: Within the upper-outer right breast there is a persistent focal asymmetry. Additionally within the outer right breast coursing from the lateral retroareolar location to the mid/posterior breast is approximately 11 cm of linearly oriented pleomorphic calcifications.   On physical exam, no discrete mass is palpated within the upper-outer right breast.   Targeted ultrasound is performed, showing a 7 x 8 x 6 mm cyst right breast 11 o'clock position 2 cm from nipple. There is an adjacent 8 x 5 x 6 mm cyst. No suspicious mass identified within the upper-outer right breast. Dense tissue is visualized.   IMPRESSION: 1. Suspicious right breast calcifications. 2. Right breast cysts.   RECOMMENDATION: Stereotactic guided core needle biopsy of the anterior and posterior aspect of the linearly oriented pleomorphic calcifications involving the outer right breast extending from the nipple to the posterior right breast.   I have discussed the findings and recommendations with the patient. If applicable, a reminder letter will be sent to the patient regarding the next appointment.   BI-RADS CATEGORY  4: Suspicious.     Pathology core needle biopsy: 02/04/21 1. Breast, right, needle core biopsy, outer, anterior x clip - DUCTAL CARCINOMA IN SITU WITH NECROSIS AND CALCIFICATIONS. - SEE MICROSCOPIC DESCRIPTION. 2. Breast, right, needle core  biopsy, outer, posterior coil clip - FIBROCYSTIC CHANGES WITH USUAL DUCTAL HYPERPLASIA AND CALCIFICATIONS. - FOCAL ATYPICAL LOBULAR HYPERPLASIA.   Receptors: Estrogen Receptor: >95%, POSITIVE, STRONG STAINING INTENSITY Progesterone Receptor: 30%, POSITIVE, STRONG STAINING INTENSITY    Review of Systems: A complete review of systems was obtained from the patient.  I have reviewed this information and  discussed as appropriate with the patient.  See HPI as well for other ROS.   Review of Systems  Constitutional: Positive for diaphoresis (hot sweats occasionally) and malaise/fatigue.  HENT: Positive for congestion and tinnitus.   Eyes: Negative.   Respiratory: Negative.   Cardiovascular: Negative.   Gastrointestinal: Positive for abdominal pain and nausea.  Genitourinary:       H/o urinary infections.  Musculoskeletal: Positive for back pain, joint pain and myalgias.  Endo/Heme/Allergies:       Thyroid issues  Psychiatric/Behavioral: Positive for depression. The patient is nervous/anxious.         Medical History: Past Medical History      Past Medical History:  Diagnosis Date   Arthritis      right knee   Depression     Diverticulosis     Hemorrhoids             Patient Active Problem List  Diagnosis   Malignant neoplasm of upper-outer quadrant of right breast in female, estrogen receptor positive (CMS-HCC)   Bilateral knee pain   Allergic rhinitis   Depression   Diabetes mellitus (CMS-HCC)   Diverticulosis of colon (without mention of hemorrhage)   Fatty liver   GAD (generalized anxiety disorder)   S/P total knee arthroplasty, right   Hypertension   Hypothyroidism   Severe recurrent major depression (CMS-HCC)   Pain of lumbar spine   Anxiety   Disseminated idiopathic skeletal hyperostosis   History of lumbar fusion      Past Surgical History       Past Surgical History:  Procedure Laterality Date   APPENDECTOMY       CHOLECYSTECTOMY       HYSTERECTOMY       JOINT REPLACEMENT       SPINE SURGERY       TONSILLECTOMY            Allergies       Allergies  Allergen Reactions   Adhesive Rash   Povidone-Iodine Hives      Topical      Acetaminophen Nausea And Vomiting   Celecoxib Itching   Codeine Rash and Hives   Doxycycline Rash              Current Outpatient Medications on File Prior to Visit  Medication Sig Dispense Refill   albuterol  90 mcg/actuation inhaler albuterol sulfate HFA 90 mcg/actuation aerosol inhaler  INHALE 2 PUFFS BY MOUTH EVERY 4 HOURS AS NEEDED       ALPRAZolam (XANAX) 0.5 MG tablet Take by mouth 2 (two) times daily as needed       baclofen (LIORESAL) 10 MG tablet baclofen 10 mg tablet  TAKE 1 TABLET BY MOUTH TID AS NEEDED       cholecalciferol (VITAMIN D3) 1000 unit tablet Take by mouth       levothyroxine (SYNTHROID) 88 MCG tablet Take 88 mcg by mouth once daily Take on an empty stomach with a glass of water at least 30-60 minutes before breakfast.       oxybutynin (DITROPAN) 5 mg tablet Take 5 mg by mouth 3 (three) times daily  oxyCODONE (ROXICODONE) 5 MG immediate release tablet Take 5 mg by mouth 3 (three) times daily as needed       traZODone (DESYREL) 50 MG tablet Take 50 mg by mouth nightly       venlafaxine (EFFEXOR-XR) 37.5 MG XR capsule venlafaxine ER 37.5 mg capsule,extended release 24 hr  TAKE 1 CAPSULE BY MOUTH DAILY WITH FOOD       ascorbic acid, vitamin C, 250 mg Chew Vitamin C 250 mg chewable tablet (Patient not taking: Reported on 02/10/2021)       cetirizine (ZYRTEC) 10 MG tablet once daily (Patient not taking: Reported on 02/10/2021)       cycloSPORINE (RESTASIS) 0.05 % ophthalmic emulsion Restasis 0.05 % eye drops in a dropperette  INT 1 GTT IN OU BID (Patient not taking: Reported on 02/10/2021)       semaglutide (OZEMPIC SUBQ) Inject subcutaneously (Patient not taking: Reported on 02/10/2021)        No current facility-administered medications on file prior to visit.      Family History       Family History  Problem Relation Age of Onset   Diabetes Mother     High blood pressure (Hypertension) Mother     COPD Mother     Myocardial Infarction (Heart attack) Father     Myasthenia gravis Daughter     Depression Daughter          Social History       Tobacco Use  Smoking Status Never Smoker  Smokeless Tobacco Never Used      Social History  Social History         Socioeconomic History   Marital status: Unknown  Tobacco Use   Smoking status: Never Smoker   Smokeless tobacco: Never Used  Substance and Sexual Activity   Alcohol use: Not Currently   Drug use: Never        Objective:         Vitals:    02/10/21 1518  BP: 137/61  Pulse: 72  Resp: 18  Temp: 36.7 C (98 F)  Weight: 94 kg (207 lb 4.8 oz)  Height: 168.9 cm (5' 6.5")    Body mass index is 32.96 kg/m.       Gen:  No acute distress.  Well nourished and well groomed.   Neurological: Alert and oriented to person, place, and time. Coordination normal.  Head: Normocephalic and atraumatic.  Eyes: Conjunctivae are normal. Pupils are equal, round, and reactive to light. No scleral icterus.  Neck: Normal range of motion. Neck supple. No tracheal deviation or thyromegaly present.  Cardiovascular: Normal rate, regular rhythm, normal heart sounds and intact distal pulses.  Exam reveals no gallop and no friction rub.  No murmur heard. Breast: no palpable masses.  Bruising and tenderness laterally on the right breast.  No LAD.  No nipple retraction or skin dimpling.  No nipple discharge.  Left breast normal.   Respiratory: Effort normal.  No respiratory distress. No chest wall tenderness. Breath sounds normal.  No wheezes, rales or rhonchi.  GI: Soft. Bowel sounds are normal. The abdomen is soft and nontender.  There is no rebound and no guarding.  Musculoskeletal: Normal range of motion. Extremities are nontender.  Lymphadenopathy: No cervical, preauricular, postauricular or axillary adenopathy is present Skin: Skin is warm and dry. No rash noted. No diaphoresis. No erythema. No pallor. No clubbing, cyanosis. Tr edema   Psychiatric: Normal mood and affect. Behavior is normal. Judgment and thought content  normal.      Labs 02/10/21 CBC with WBCs sl elevated at 12.5k CMET normal other than glucose of 208   Assessment and Plan:    Malignant neoplasm of upper-outer quadrant of right  breast in female, estrogen receptor positive (CMS-HCC)  If this 11 cm area is cancer, she will need mastectomy vs large partial mastectomy with oncoplastic reconstruction and contralateral reduction. This will be difficult and would be much more likely to result in positive margins and require reexcision. The anterior extent we know is DCIS and the posterior is ALH.  She is set up for 2 additional biopsies for the portion of calcifications in between the two.    She is going to do a seed bracketed lumpectomy.  The surgical procedure was described to the patient.  I discussed the incision type and location and that we would need radiology involved on with a wire or seed marker and/or sentinel node.      The risks and benefits of the procedure were described to the patient and she wishes to proceed.    We discussed the risks bleeding, infection, damage to other structures, need for further procedures/surgeries.  We discussed the risk of seroma.  The patient was advised if the area in the breast in cancer, we may need to go back to surgery for additional tissue to obtain negative margins or for a lymph node biopsy. The patient was advised that these are the most common complications, but that others can occur as well.  They were advised against taking aspirin or other anti-inflammatory agents/blood thinners the week before surgery.         Milus Height, MD FACS Surgical Oncology, General Surgery, Trauma and [facility]

## 2021-03-25 ENCOUNTER — Ambulatory Visit (HOSPITAL_COMMUNITY): Payer: Medicare HMO | Admitting: Vascular Surgery

## 2021-03-25 ENCOUNTER — Ambulatory Visit
Admission: RE | Admit: 2021-03-25 | Discharge: 2021-03-25 | Disposition: A | Discharge status: Home / Self Care | Payer: Medicare HMO | Source: Ambulatory Visit | Attending: General Surgery | Admitting: General Surgery

## 2021-03-25 ENCOUNTER — Other Ambulatory Visit: Payer: Self-pay

## 2021-03-25 ENCOUNTER — Encounter (HOSPITAL_COMMUNITY): Payer: Self-pay | Admitting: General Surgery

## 2021-03-25 ENCOUNTER — Encounter (HOSPITAL_COMMUNITY)
Admission: RE | Disposition: A | Discharge status: Home / Self Care | Payer: Self-pay | Source: Home / Self Care | Attending: General Surgery

## 2021-03-25 ENCOUNTER — Ambulatory Visit (HOSPITAL_COMMUNITY)
Admission: RE | Admit: 2021-03-25 | Discharge: 2021-03-25 | Disposition: A | Discharge status: Home / Self Care | Payer: Medicare HMO | Attending: General Surgery | Admitting: General Surgery

## 2021-03-25 ENCOUNTER — Ambulatory Visit (HOSPITAL_COMMUNITY): Payer: Medicare HMO | Admitting: Anesthesiology

## 2021-03-25 DIAGNOSIS — C50911 Malignant neoplasm of unspecified site of right female breast: Secondary | ICD-10-CM | POA: Diagnosis not present

## 2021-03-25 DIAGNOSIS — Z170 Estrogen receptor positive status [ER+]: Secondary | ICD-10-CM

## 2021-03-25 DIAGNOSIS — D0511 Intraductal carcinoma in situ of right breast: Secondary | ICD-10-CM | POA: Insufficient documentation

## 2021-03-25 DIAGNOSIS — Z825 Family history of asthma and other chronic lower respiratory diseases: Secondary | ICD-10-CM | POA: Insufficient documentation

## 2021-03-25 DIAGNOSIS — C50411 Malignant neoplasm of upper-outer quadrant of right female breast: Secondary | ICD-10-CM

## 2021-03-25 DIAGNOSIS — F322 Major depressive disorder, single episode, severe without psychotic features: Secondary | ICD-10-CM | POA: Diagnosis not present

## 2021-03-25 DIAGNOSIS — Z7951 Long term (current) use of inhaled steroids: Secondary | ICD-10-CM | POA: Diagnosis not present

## 2021-03-25 DIAGNOSIS — Z833 Family history of diabetes mellitus: Secondary | ICD-10-CM | POA: Insufficient documentation

## 2021-03-25 DIAGNOSIS — N6031 Fibrosclerosis of right breast: Secondary | ICD-10-CM | POA: Diagnosis not present

## 2021-03-25 DIAGNOSIS — Z91048 Other nonmedicinal substance allergy status: Secondary | ICD-10-CM | POA: Insufficient documentation

## 2021-03-25 DIAGNOSIS — Z803 Family history of malignant neoplasm of breast: Secondary | ICD-10-CM | POA: Diagnosis not present

## 2021-03-25 DIAGNOSIS — Z888 Allergy status to other drugs, medicaments and biological substances status: Secondary | ICD-10-CM | POA: Insufficient documentation

## 2021-03-25 DIAGNOSIS — Z79899 Other long term (current) drug therapy: Secondary | ICD-10-CM | POA: Diagnosis not present

## 2021-03-25 DIAGNOSIS — Z885 Allergy status to narcotic agent status: Secondary | ICD-10-CM | POA: Insufficient documentation

## 2021-03-25 DIAGNOSIS — Z79890 Hormone replacement therapy (postmenopausal): Secondary | ICD-10-CM | POA: Insufficient documentation

## 2021-03-25 DIAGNOSIS — Z8249 Family history of ischemic heart disease and other diseases of the circulatory system: Secondary | ICD-10-CM | POA: Diagnosis not present

## 2021-03-25 DIAGNOSIS — Z8042 Family history of malignant neoplasm of prostate: Secondary | ICD-10-CM | POA: Insufficient documentation

## 2021-03-25 DIAGNOSIS — Z881 Allergy status to other antibiotic agents status: Secondary | ICD-10-CM | POA: Diagnosis not present

## 2021-03-25 DIAGNOSIS — E119 Type 2 diabetes mellitus without complications: Secondary | ICD-10-CM | POA: Insufficient documentation

## 2021-03-25 DIAGNOSIS — E039 Hypothyroidism, unspecified: Secondary | ICD-10-CM | POA: Diagnosis not present

## 2021-03-25 DIAGNOSIS — R928 Other abnormal and inconclusive findings on diagnostic imaging of breast: Secondary | ICD-10-CM | POA: Diagnosis not present

## 2021-03-25 HISTORY — PX: BREAST LUMPECTOMY WITH RADIOACTIVE SEED LOCALIZATION: SHX6424

## 2021-03-25 HISTORY — PX: BREAST LUMPECTOMY: SHX2

## 2021-03-25 LAB — GLUCOSE, CAPILLARY: Glucose-Capillary: 97 mg/dL (ref 70–99)

## 2021-03-25 SURGERY — BREAST LUMPECTOMY WITH RADIOACTIVE SEED LOCALIZATION
Anesthesia: General | Site: Breast | Laterality: Right

## 2021-03-25 MED ORDER — LIDOCAINE-EPINEPHRINE 1 %-1:100000 IJ SOLN
INTRAMUSCULAR | Status: AC
  Filled 2021-03-25: qty 1

## 2021-03-25 MED ORDER — OXYCODONE HCL 5 MG PO TABS
5.0000 mg | ORAL_TABLET | Freq: Once | ORAL | Status: AC
  Administered 2021-03-25: 5 mg via ORAL

## 2021-03-25 MED ORDER — CHLORHEXIDINE GLUCONATE CLOTH 2 % EX PADS: 6.0000 | MEDICATED_PAD | Freq: Once | CUTANEOUS | Status: DC

## 2021-03-25 MED ORDER — MIDAZOLAM HCL 2 MG/2ML IJ SOLN
INTRAMUSCULAR | Status: AC
  Filled 2021-03-25: qty 2

## 2021-03-25 MED ORDER — LACTATED RINGERS IV SOLN: INTRAVENOUS | Status: DC | PRN

## 2021-03-25 MED ORDER — LACTATED RINGERS IV SOLN: INTRAVENOUS | Status: DC

## 2021-03-25 MED ORDER — LIDOCAINE 2% (20 MG/ML) 5 ML SYRINGE
INTRAMUSCULAR | Status: DC | PRN
  Administered 2021-03-25: 60 mg via INTRAVENOUS

## 2021-03-25 MED ORDER — CEFAZOLIN SODIUM-DEXTROSE 2-4 GM/100ML-% IV SOLN
2.0000 g | INTRAVENOUS | Status: AC
  Administered 2021-03-25: 2 g via INTRAVENOUS
  Filled 2021-03-25: qty 100

## 2021-03-25 MED ORDER — PROPOFOL 10 MG/ML IV BOLUS
INTRAVENOUS | Status: DC | PRN
  Administered 2021-03-25: 180 mg via INTRAVENOUS

## 2021-03-25 MED ORDER — LIDOCAINE HCL 1 % IJ SOLN
INTRAMUSCULAR | Status: DC | PRN
  Administered 2021-03-25: 40 mL

## 2021-03-25 MED ORDER — MIDAZOLAM HCL 2 MG/2ML IJ SOLN
INTRAMUSCULAR | Status: DC | PRN
  Administered 2021-03-25: 2 mg via INTRAVENOUS

## 2021-03-25 MED ORDER — DROPERIDOL 2.5 MG/ML IJ SOLN: 0.6250 mg | Freq: Once | INTRAMUSCULAR | Status: DC | PRN

## 2021-03-25 MED ORDER — OXYCODONE HCL 5 MG PO TABS
ORAL_TABLET | ORAL | Status: AC
  Filled 2021-03-25: qty 1

## 2021-03-25 MED ORDER — CHLORHEXIDINE GLUCONATE 0.12 % MT SOLN: 15.0000 mL | Freq: Once | OROMUCOSAL | Status: AC

## 2021-03-25 MED ORDER — ONDANSETRON HCL 4 MG/2ML IJ SOLN
INTRAMUSCULAR | Status: DC | PRN
  Administered 2021-03-25: 4 mg via INTRAVENOUS

## 2021-03-25 MED ORDER — FENTANYL CITRATE (PF) 250 MCG/5ML IJ SOLN
INTRAMUSCULAR | Status: DC | PRN
  Administered 2021-03-25: 50 ug via INTRAVENOUS

## 2021-03-25 MED ORDER — PROMETHAZINE HCL 25 MG/ML IJ SOLN: 6.2500 mg | INTRAMUSCULAR | Status: DC | PRN

## 2021-03-25 MED ORDER — DEXAMETHASONE SODIUM PHOSPHATE 10 MG/ML IJ SOLN
INTRAMUSCULAR | Status: DC | PRN
  Administered 2021-03-25: 8 mg via INTRAVENOUS

## 2021-03-25 MED ORDER — CHLORHEXIDINE GLUCONATE 0.12 % MT SOLN
OROMUCOSAL | Status: AC
  Administered 2021-03-25: 15 mL via OROMUCOSAL
  Filled 2021-03-25: qty 15

## 2021-03-25 MED ORDER — OXYCODONE HCL 5 MG PO TABS: 5.0000 mg | ORAL_TABLET | Freq: Four times a day (QID) | ORAL | 0 refills | Status: DC | PRN

## 2021-03-25 MED ORDER — BUPIVACAINE HCL (PF) 0.25 % IJ SOLN
INTRAMUSCULAR | Status: AC
  Filled 2021-03-25: qty 30

## 2021-03-25 MED ORDER — FENTANYL CITRATE (PF) 100 MCG/2ML IJ SOLN
INTRAMUSCULAR | Status: AC
  Filled 2021-03-25: qty 2

## 2021-03-25 MED ORDER — DEXAMETHASONE SODIUM PHOSPHATE 10 MG/ML IJ SOLN
INTRAMUSCULAR | Status: AC
  Filled 2021-03-25: qty 1

## 2021-03-25 MED ORDER — ONDANSETRON HCL 4 MG/2ML IJ SOLN
INTRAMUSCULAR | Status: AC
  Filled 2021-03-25: qty 2

## 2021-03-25 MED ORDER — 0.9 % SODIUM CHLORIDE (POUR BTL) OPTIME
TOPICAL | Status: DC | PRN
  Administered 2021-03-25: 1000 mL

## 2021-03-25 MED ORDER — FENTANYL CITRATE (PF) 100 MCG/2ML IJ SOLN
25.0000 ug | INTRAMUSCULAR | Status: DC | PRN
  Administered 2021-03-25: 50 ug via INTRAVENOUS

## 2021-03-25 MED ORDER — FENTANYL CITRATE (PF) 250 MCG/5ML IJ SOLN
INTRAMUSCULAR | Status: AC
  Filled 2021-03-25: qty 5

## 2021-03-25 MED ORDER — LIDOCAINE 2% (20 MG/ML) 5 ML SYRINGE
INTRAMUSCULAR | Status: AC
  Filled 2021-03-25: qty 5

## 2021-03-25 MED ORDER — SCOPOLAMINE 1 MG/3DAYS TD PT72
1.0000 | MEDICATED_PATCH | TRANSDERMAL | Status: DC
  Administered 2021-03-25: 1.5 mg via TRANSDERMAL
  Filled 2021-03-25: qty 1

## 2021-03-25 MED ORDER — ORAL CARE MOUTH RINSE: 15.0000 mL | Freq: Once | OROMUCOSAL | Status: AC

## 2021-03-25 SURGICAL SUPPLY — 40 items
BAG COUNTER SPONGE SURGICOUNT (BAG) ×2 IMPLANT
BINDER BREAST LRG (GAUZE/BANDAGES/DRESSINGS) IMPLANT
BINDER BREAST XLRG (GAUZE/BANDAGES/DRESSINGS) ×2 IMPLANT
BLADE SURG 10 STRL SS (BLADE) ×2 IMPLANT
CANISTER SUCT 3000ML PPV (MISCELLANEOUS) IMPLANT
CHLORAPREP W/TINT 26 (MISCELLANEOUS) ×2 IMPLANT
CLIP VESOCCLUDE LG 6/CT (CLIP) ×2 IMPLANT
CLSR STERI-STRIP ANTIMIC 1/2X4 (GAUZE/BANDAGES/DRESSINGS) ×2 IMPLANT
COVER PROBE W GEL 5X96 (DRAPES) ×2 IMPLANT
COVER SURGICAL LIGHT HANDLE (MISCELLANEOUS) ×2 IMPLANT
DERMABOND ADVANCED (GAUZE/BANDAGES/DRESSINGS) ×1
DERMABOND ADVANCED .7 DNX12 (GAUZE/BANDAGES/DRESSINGS) ×1 IMPLANT
DEVICE DUBIN SPECIMEN MAMMOGRA (MISCELLANEOUS) ×2 IMPLANT
DRAPE CHEST BREAST 15X10 FENES (DRAPES) ×2 IMPLANT
DRSG PAD ABDOMINAL 8X10 ST (GAUZE/BANDAGES/DRESSINGS) ×2 IMPLANT
ELECT COATED BLADE 2.86 ST (ELECTRODE) ×2 IMPLANT
ELECT REM PT RETURN 9FT ADLT (ELECTROSURGICAL) ×2
ELECTRODE REM PT RTRN 9FT ADLT (ELECTROSURGICAL) ×1 IMPLANT
GAUZE SPONGE 4X4 12PLY STRL LF (GAUZE/BANDAGES/DRESSINGS) ×2 IMPLANT
GLOVE SURG ENC MOIS LTX SZ6 (GLOVE) ×2 IMPLANT
GLOVE SURG UNDER LTX SZ6.5 (GLOVE) ×2 IMPLANT
GOWN STRL REUS W/ TWL LRG LVL3 (GOWN DISPOSABLE) ×1 IMPLANT
GOWN STRL REUS W/TWL 2XL LVL3 (GOWN DISPOSABLE) ×2 IMPLANT
GOWN STRL REUS W/TWL LRG LVL3 (GOWN DISPOSABLE) ×2
KIT BASIN OR (CUSTOM PROCEDURE TRAY) ×2 IMPLANT
KIT MARKER MARGIN INK (KITS) ×2 IMPLANT
LIGHT WAVEGUIDE WIDE FLAT (MISCELLANEOUS) IMPLANT
NEEDLE HYPO 25GX1X1/2 BEV (NEEDLE) ×2 IMPLANT
NS IRRIG 1000ML POUR BTL (IV SOLUTION) IMPLANT
PACK GENERAL/GYN (CUSTOM PROCEDURE TRAY) ×2 IMPLANT
STRIP CLOSURE SKIN 1/2X4 (GAUZE/BANDAGES/DRESSINGS) ×2 IMPLANT
SUT MNCRL AB 4-0 PS2 18 (SUTURE) ×2 IMPLANT
SUT SILK 2 0 SH (SUTURE) IMPLANT
SUT VIC AB 2-0 SH 27 (SUTURE) ×2
SUT VIC AB 2-0 SH 27XBRD (SUTURE) ×1 IMPLANT
SUT VIC AB 3-0 SH 27 (SUTURE) ×1
SUT VIC AB 3-0 SH 27X BRD (SUTURE) ×1 IMPLANT
SYR CONTROL 10ML LL (SYRINGE) ×2 IMPLANT
TOWEL GREEN STERILE (TOWEL DISPOSABLE) ×2 IMPLANT
TOWEL GREEN STERILE FF (TOWEL DISPOSABLE) ×2 IMPLANT

## 2021-03-25 NOTE — Transfer of Care (Signed)
Immediate Anesthesia Transfer of Care Note  Patient: Carol [name]  Procedure(s) Performed: RIGHT BREAST LUMPECTOMY WITH RADIOACTIVE SEED LOCALIZATION X2 (Right: Breast)  Patient Location: PACU  Anesthesia Type:General  Level of Consciousness: drowsy and patient cooperative  Airway & Oxygen Therapy: Patient Spontanous Breathing and Patient connected to face mask oxygen  Post-op Assessment: Report given to RN and Post -op Vital signs reviewed and stable  Post vital signs: Reviewed and stable  Last Vitals:  Vitals Value Taken Time  BP 162/83 03/25/21 1505  Temp 36.3 C 03/25/21 1505  Pulse 96 03/25/21 1510  Resp 16 03/25/21 1510  SpO2 98 % 03/25/21 1510  Vitals shown include unvalidated device data.  Last Pain:  Vitals:   03/25/21 1505  TempSrc:   PainSc: 5       Patients Stated Pain Goal: 3 ([id number])  Complications: No notable events documented.

## 2021-03-25 NOTE — Interval H&P Note (Signed)
History and Physical Interval Note:  03/25/2021 1:15 PM  Carol [name]  has presented today for surgery, with the diagnosis of RIGHT BREAST CANCER.  The various methods of treatment have been discussed with the patient and family. After consideration of risks, benefits and other options for treatment, the patient has consented to  Procedure(s): RIGHT BREAST LUMPECTOMY WITH RADIOACTIVE SEED LOCALIZATION X2 (Right) as a surgical intervention.  The patient's history has been reviewed, patient examined, no change in status, stable for surgery.  I have reviewed the patient's chart and labs.  Questions were answered to the patient's satisfaction.     Stark Klein

## 2021-03-25 NOTE — Op Note (Signed)
Right Breast Radioactive seed bracketed lumpectomy, right seed localized excisional biopsy  Indications: This patient presents with history of right breast cancer, upper outer quadrant, cTis, receptors +/+.  Also present was [facility]   Pre-operative Diagnosis: see above  Post-operative Diagnosis: Same  Surgeon: Stark Klein   Anesthesia: General endotracheal anesthesia  ASA Class: 3  Procedure Details  The patient was seen in the Holding Room. The risks, benefits, complications, treatment options, and expected outcomes were discussed with the patient. The possibilities of bleeding, infection, the need for additional procedures, failure to diagnose a condition, and creating a complication requiring other procedures or operations were discussed with the patient. The patient concurred with the proposed plan, giving informed consent.  The site of surgery properly noted/marked. The patient was taken to Operating Room # 9, identified, and the procedure verified as right breast seed localized lumpectomy x 2  The right breast and chest were prepped and draped in standard fashion. A transverse lateral incision was made near the previously placed radioactive seed.  Skin hooks were used to assist with creating skin flaps.  The lateral point of atypia was addressed first. Dissection was carried down around the point of maximum signal intensity. The cautery was used to perform the dissection.   The specimen was inked with the margin marker paint kit.    Specimen radiography confirmed inclusion of the seed. No clip was present due to significant migration.    The DCIS was addressed next.  Two clips were placed for this.  This was accessed through the same incision.  These two clips and seeds were taken in one specimen.  This was confirmed with specimen mammogram.   The background signal in the breast was zero.  Hemostasis was achieved with cautery.  The cavity was marked with clips on each border other than the  anterior border.  Local anesthetic was infiltrated into the breast around the skin.  The wound was irrigated and closed with 3-0 vicryl interrupted deep dermal sutures and 4-0 monocryl running subcuticular suture.      Sterile dressings were applied. At the end of the operation, all sponge, instrument, and needle counts were correct.   Findings: Seed in specimen #1, two seeds and two clips in specimen #2.  Anterior margin is skin of all three lesions.     Estimated Blood Loss:  min         Specimens: right breast tissue with seed (lateral), right DCIS         Complications:  None; patient tolerated the procedure well.         Disposition: PACU - hemodynamically stable.         Condition: stable

## 2021-03-25 NOTE — Discharge Instructions (Addendum)
[facility] Office Phone Number [phone]  BREAST BIOPSY/ PARTIAL MASTECTOMY: POST OP INSTRUCTIONS  Always review your discharge instruction sheet given to you by the facility where your surgery was performed.  IF YOU HAVE DISABILITY OR FAMILY LEAVE FORMS, YOU MUST BRING THEM TO THE OFFICE FOR PROCESSING.  DO NOT GIVE THEM TO YOUR DOCTOR.  A prescription for pain medication may be given to you upon discharge.  Take your pain medication as prescribed, if needed.  If narcotic pain medicine is not needed, then you may take acetaminophen (Tylenol) or ibuprofen (Advil) as needed. Take your usually prescribed medications unless otherwise directed If you need a refill on your pain medication, please contact your pharmacy.  They will contact our office to request authorization.  Prescriptions will not be filled after 5pm or on week-ends. You should eat very light the first 24 hours after surgery, such as soup, crackers, pudding, etc.  Resume your normal diet the day after surgery. Most patients will experience some swelling and bruising in the breast.  Ice packs and a good support bra will help.  Swelling and bruising can take several days to resolve.  It is common to experience some constipation if taking pain medication after surgery.  Increasing fluid intake and taking a stool softener will usually help or prevent this problem from occurring.  A mild laxative (Milk of Magnesia or Miralax) should be taken according to package directions if there are no bowel movements after 48 hours. Unless discharge instructions indicate otherwise, you may remove your bandages 48 hours after surgery, and you may shower at that time.  You may have steri-strips (small skin tapes) in place directly over the incision.  These strips should be left on the skin for 7-10 days.   Any sutures or staples will be removed at the office during your follow-up visit. ACTIVITIES:  You may resume regular daily activities  (gradually increasing) beginning the next day.  Wearing a good support bra or sports bra (or the breast binder) minimizes pain and swelling.  You may have sexual intercourse when it is comfortable. You may drive when you no longer are taking prescription pain medication, you can comfortably wear a seatbelt, and you can safely maneuver your car and apply brakes. RETURN TO WORK:  __________1 week_______________ You should see your doctor in the office for a follow-up appointment approximately two weeks after your surgery.  Your doctor's nurse will typically make your follow-up appointment when she calls you with your pathology report.  Expect your pathology report 2-3 business days after your surgery.  You may call to check if you do not hear from us after three days.   WHEN TO CALL YOUR DOCTOR: Fever over 101.0 Nausea and/or vomiting. Extreme swelling or bruising. Continued bleeding from incision. Increased pain, redness, or drainage from the incision.  The clinic staff is available to answer your questions during regular business hours.  Please don't hesitate to call and ask to speak to one of the nurses for clinical concerns.  If you have a medical emergency, go to the nearest emergency room or call 911.  A surgeon from [facility] is always on call at the hospital.  For further questions, please visit [email or web address]   

## 2021-03-25 NOTE — Anesthesia Procedure Notes (Signed)
Procedure Name: LMA Insertion Date/Time: 03/25/2021 1:48 PM Performed by: Kathryne Hitch, CRNA Pre-anesthesia Checklist: Patient identified, Emergency Drugs available, Patient being monitored and Suction available Patient Re-evaluated:Patient Re-evaluated prior to induction Oxygen Delivery Method: Circle system utilized Preoxygenation: Pre-oxygenation with 100% oxygen Induction Type: IV induction Ventilation: Mask ventilation without difficulty LMA: LMA inserted LMA Size: 4.0 Number of attempts: 1 Placement Confirmation: positive ETCO2 Tube secured with: Tape Dental Injury: Teeth and Oropharynx as per pre-operative assessment

## 2021-03-26 ENCOUNTER — Encounter (HOSPITAL_COMMUNITY): Payer: Self-pay | Admitting: General Surgery

## 2021-03-26 NOTE — Anesthesia Postprocedure Evaluation (Signed)
Anesthesia Post Note  Patient: Carol [name]  Procedure(s) Performed: RIGHT BREAST LUMPECTOMY WITH RADIOACTIVE SEED LOCALIZATION X2 (Right: Breast)     Patient location during evaluation: PACU Anesthesia Type: General Level of consciousness: awake and alert Pain management: pain level controlled Vital Signs Assessment: post-procedure vital signs reviewed and stable Respiratory status: spontaneous breathing, nonlabored ventilation, respiratory function stable and patient connected to nasal cannula oxygen Cardiovascular status: blood pressure returned to baseline and stable Postop Assessment: no apparent nausea or vomiting Anesthetic complications: no   No notable events documented.  Last Vitals:  Vitals:   03/25/21 1520 03/25/21 1535  BP: (!) 146/63 132/64  Pulse: 86   Resp: 11   Temp:    SpO2: 95% 94%    Last Pain:  Vitals:   03/25/21 1535  TempSrc:   PainSc: [place]

## 2021-03-31 LAB — SURGICAL PATHOLOGY

## 2021-04-02 ENCOUNTER — Telehealth: Payer: Self-pay | Admitting: General Surgery

## 2021-04-02 ENCOUNTER — Encounter: Payer: Self-pay | Admitting: *Deleted

## 2021-04-02 ENCOUNTER — Other Ambulatory Visit: Payer: Self-pay | Admitting: General Surgery

## 2021-04-02 NOTE — Telephone Encounter (Signed)
Discussed pathology.  Pt will need reexcision.  Orders written in epic and submitted to scheduling.

## 2021-04-03 NOTE — Progress Notes (Signed)
Patient Care Team: [name], MD as PCP - General (Family Medicine) Mauro Kaufmann, RN as Oncology Nurse Navigator Rockwell Germany, RN as Oncology Nurse Navigator Stark Klein, MD as Consulting Physician (General Surgery) Nicholas Lose, MD as Consulting Physician (Hematology and Oncology) Eppie Gibson, MD as Attending Physician (Radiation Oncology)  DIAGNOSIS:    ICD-10-CM   1. Ductal carcinoma in situ (DCIS) of right breast  D05.11       SUMMARY OF ONCOLOGIC HISTORY: Oncology History  Ductal carcinoma in situ (DCIS) of right breast  02/02/2021 Initial Diagnosis   Screening mammogram on 01/10/21 showed a possible mass and calcifications in the right breast. Diagnostic mammogram and [place] on 02/02/21 showed suspicious right breast calcifications and right breast cysts. Biopsy on 02/04/21 showed DCIS with necrosis and calcifications ER+ (95%)/ PR+ (30%).   02/18/2021 Genetic Testing   Negative genetic testing:  No pathogenic variants detected on the [employer] BRCAplus panel (report date 02/18/2021) or CancerNext-Expanded + RNAinsight panel (report date 02/20/2021).   The BRCAplus panel offered by [employer] and includes sequencing and deletion/duplication analysis for the following 8 genes: ATM, BRCA1, BRCA2, CDH1, CHEK2, PALB2, PTEN, and TP53. The CancerNext-Expanded + RNAinsight gene panel offered by [employer] and includes sequencing and rearrangement analysis for the following 77 genes: AIP, ALK, APC, ATM, AXIN2, BAP1, BARD1, BLM, BMPR1A, BRCA1, BRCA2, BRIP1, CDC73, CDH1, CDK4, CDKN1B, CDKN2A, CHEK2, CTNNA1, DICER1, FANCC, FH, FLCN, GALNT12, KIF1B, LZTR1, MAX, MEN1, MET, MLH1, MSH2, MSH3, MSH6, MUTYH, NBN, NF1, NF2, NTHL1, PALB2, PHOX2B, PMS2, POT1, PRKAR1A, PTCH1, PTEN, RAD51C, RAD51D, RB1, RECQL, RET, SDHA, SDHAF2, SDHB, SDHC, SDHD, SMAD4, SMARCA4, SMARCB1, SMARCE1, STK11, SUFU, TMEM127, TP53, TSC1, TSC2, VHL and XRCC2 (sequencing and deletion/duplication); EGFR, EGLN1,  HOXB13, KIT, MITF, PDGFRA, POLD1 and POLE (sequencing only); EPCAM and GREM1 (deletion/duplication only). RNA data is routinely analyzed for use in variant interpretation for all genes.     CHIEF COMPLIANT: Follow-up of right breast cancer  INTERVAL HISTORY: Carol [name] is a 65 y.o. with above-mentioned history of right breast DCIS. Right breast lumpectomy on 03/25/2021 showed intermediate to high grade DCIS. She presents to the clinic today for follow-up.  Doing very well from surgery standpoint.  Denies any major pain or discomfort.  ALLERGIES:  is allergic to acetaminophen, celebrex [celecoxib], povidone iodine, tape, codeine, and doxycycline.  MEDICATIONS:  Current Outpatient Medications  Medication Sig Dispense Refill   ALPRAZolam (XANAX) 0.25 MG tablet Take 0.25 mg by mouth daily as needed for anxiety.     Ascorbic Acid (VITAMIN C) 1000 MG tablet Take 1,000 mg by mouth daily.     baclofen (LIORESAL) 10 MG tablet Take 10 mg by mouth 3 (three) times daily.     Biotin 10 MG CAPS Take 10 mg by mouth daily.     levothyroxine (SYNTHROID) 88 MCG tablet Take 88 mcg by mouth daily before breakfast.     Multiple Vitamins-Minerals (CENTRUM SILVER PO) Take 1 tablet by mouth daily.     ondansetron (ZOFRAN-ODT) 8 MG disintegrating tablet Take 8 mg by mouth 2 (two) times daily.     oxybutynin (DITROPAN) 5 MG tablet Take 5 mg by mouth daily.     oxyCODONE (OXY IR/ROXICODONE) 5 MG immediate release tablet Take 5 mg by mouth 3 (three) times daily.     oxyCODONE (OXY IR/ROXICODONE) 5 MG immediate release tablet Take 1 tablet (5 mg total) by mouth every 6 (six) hours as needed for severe pain. 20 tablet 0   Semaglutide,0.25  or 0.5MG/DOS, (OZEMPIC, 0.25 OR 0.5 MG/DOSE,) 2 MG/1.5ML SOPN Inject 0.5 mg into the skin every Wednesday.     traZODone (DESYREL) 50 MG tablet Take 50 mg by mouth at bedtime.     venlafaxine XR (EFFEXOR-XR) 75 MG 24 hr capsule Take 75 mg by mouth daily with breakfast.     No  current facility-administered medications for this visit.    PHYSICAL EXAMINATION: ECOG PERFORMANCE STATUS: 1 - Symptomatic but completely ambulatory  Vitals:   04/05/21 1407  BP: 131/73  Pulse: 78  Resp: 18  Temp: (!) 97.3 F (36.3 C)  SpO2: 98%   Filed Weights   04/05/21 1407  Weight: 213 lb 2 oz (96.7 kg)      LABORATORY DATA:  I have reviewed the data as listed CMP Latest Ref Rng & Units 03/16/2021 02/10/2021 09/27/2017  Glucose 70 - 99 mg/dL 92 208(H) 80  BUN 8 - 23 mg/dL _0 Creatinine 0.44 - 1.00 mg/dL 0.83 0.79 0.78  Sodium 135 - 145 mmol/L 137 137 136  Potassium 3.5 - 5.1 mmol/L 4.0 4.5 4.1  Chloride 98 - 111 mmol/L 102 101 100(L)  CO2 22 - 32 mmol/L _1 Calcium 8.9 - 10.3 mg/dL 8.9 9.2 9.4  Total Protein 6.5 - 8.1 g/dL - 7.4 -  Total Bilirubin 0.3 - 1.2 mg/dL - 0.6 -  Alkaline Phos 38 - 126 U/L - 98 -  AST 15 - 41 U/L - 25 -  ALT 0 - 44 U/L - 31 -    Lab Results  Component Value Date   WBC 11.8 (H) 03/16/2021   HGB 14.5 03/16/2021   HCT 44.6 03/16/2021   MCV 91.4 03/16/2021   PLT 268 03/16/2021   NEUTROABS 4.4 02/10/2021    ASSESSMENT & PLAN:  Ductal carcinoma in situ (DCIS) of right breast 02/02/2021:Screening mammogram on 01/10/21 showed a possible mass and calcifications in the right breast. Diagnostic mammogram and [place] on 02/02/21 showed suspicious right breast calcifications and right breast cysts. Biopsy on 02/04/21 showed DCIS with necrosis and calcifications ER+ (95%)/ PR+ (30%).  Recommendation: 1. Breast conserving surgery: 03/25/2021: Right lumpectomy: Intermediate grade high-grade DCIS 0.7 cm, involves the posterior margin.  Right lateral lumpectomy: Benign, ER greater than 95%, PR 30% Patient will undergo additional surgery for clearance of the posterior margin. 2. Followed by adjuvant radiation therapy 3. Followed by antiestrogen therapy with tamoxifen 5 years   Pathology counseling: I discussed the final pathology report of the  patient provided  a copy of this report. I discussed the margins.  We also discussed the final staging along with previously performed ER/PR testing. Return to clinic after radiation is complete.    No orders of the defined types were placed in this encounter.  The patient has a good understanding of the overall plan. she agrees with it. she will call with any problems that may develop before the next visit here.  Total time spent: 20 mins including face to face time and time spent for planning, charting and coordination of care  Carol Eisenmenger, MD, MPH 04/05/2021  I, Thana Ates, am acting as scribe for Dr. Nicholas Lose.  I have reviewed the above documentation for accuracy and completeness, and I agree with the above.

## 2021-04-05 ENCOUNTER — Other Ambulatory Visit: Payer: Self-pay

## 2021-04-05 ENCOUNTER — Inpatient Hospital Stay: Payer: Medicare HMO | Attending: Hematology and Oncology | Admitting: Hematology and Oncology

## 2021-04-05 DIAGNOSIS — Z170 Estrogen receptor positive status [ER+]: Secondary | ICD-10-CM | POA: Diagnosis not present

## 2021-04-05 DIAGNOSIS — D0511 Intraductal carcinoma in situ of right breast: Secondary | ICD-10-CM | POA: Insufficient documentation

## 2021-04-05 NOTE — Assessment & Plan Note (Signed)
02/02/2021:Screening mammogram on 01/10/21 showed a possible mass and calcifications in the right breast. Diagnostic mammogram and [place] on 02/02/21 showed suspicious right breast calcifications and right breast cysts. Biopsy on 02/04/21 showed DCIS with necrosis and calcifications ER+ (95%)/ PR+ (30%).  Recommendation: 1. Breast conserving surgery: 03/25/2021: Right lumpectomy: Intermediate grade high-grade DCIS 0.7 cm, involves the posterior margin.  Right lateral lumpectomy: Benign, ER greater than 95%, PR 30% 2. Followed by adjuvant radiation therapy 3. Followed by antiestrogen therapy with tamoxifen 5 years  Pathology counseling: I discussed the final pathology report of the patient provided  a copy of this report. I discussed the margins.  We also discussed the final staging along with previously performed ER/PR testing.  We will discuss in tumor board about a posterior margin.

## 2021-04-06 DIAGNOSIS — Z79899 Other long term (current) drug therapy: Secondary | ICD-10-CM | POA: Diagnosis not present

## 2021-04-06 DIAGNOSIS — Z5181 Encounter for therapeutic drug level monitoring: Secondary | ICD-10-CM | POA: Diagnosis not present

## 2021-04-06 DIAGNOSIS — G894 Chronic pain syndrome: Secondary | ICD-10-CM | POA: Diagnosis not present

## 2021-04-08 ENCOUNTER — Encounter: Payer: Self-pay | Admitting: *Deleted

## 2021-04-12 DIAGNOSIS — H5200 Hypermetropia, unspecified eye: Secondary | ICD-10-CM | POA: Diagnosis not present

## 2021-04-12 DIAGNOSIS — Z0100 Encounter for examination of eyes and vision without abnormal findings: Secondary | ICD-10-CM | POA: Diagnosis not present

## 2021-04-20 ENCOUNTER — Ambulatory Visit: Payer: Medicare HMO | Admitting: Radiation Oncology

## 2021-04-20 NOTE — Progress Notes (Signed)
Surgical Instructions    Your procedure is scheduled on Thursday, October 6th, 2022.   Report to [facility] Main Entrance "A" at 14:00 P.M., then check in with the Admitting office.  Call this number if you have problems the morning of surgery:  [phone]   If you have any questions prior to your surgery date call [phone]: Open Monday-Friday 8am-4pm    Remember:  Do not eat after midnight the night before your surgery  You may drink clear liquids until 13:00 the day of your surgery.   Clear liquids allowed are: Water, Non-Citrus Juices (without pulp), Carbonated Beverages, Clear Tea, Black Coffee ONLY (NO MILK, CREAM OR POWDERED CREAMER of any kind), and Gatorade    Take these medicines the morning of surgery with A SIP OF WATER:  baclofen (LIORESAL) levothyroxine (SYNTHROID) oxyCODONE (OXY IR/ROXICODONE) venlafaxine XR (EFFEXOR-XR)  If needed:   ALPRAZolam (XANAX) ondansetron (ZOFRAN-ODT)   As of today, STOP taking any Aspirin (unless otherwise instructed by your surgeon) Aleve, Naproxen, Ibuprofen, Motrin, Advil, Goody's, BC's, all herbal medications, fish oil, and all vitamins.   WHAT DO I DO ABOUT MY DIABETES MEDICATION   HOW TO MANAGE YOUR DIABETES BEFORE AND AFTER SURGERY  Why is it important to control my blood sugar before and after surgery? Improving blood sugar levels before and after surgery helps healing and can limit problems. A way of improving blood sugar control is eating a healthy diet by:  Eating less sugar and carbohydrates  Increasing activity/exercise  Talking with your doctor about reaching your blood sugar goals High blood sugars (greater than 180 mg/dL) can raise your risk of infections and slow your recovery, so you will need to focus on controlling your diabetes during the weeks before surgery. Make sure that the doctor who takes care of your diabetes knows about your planned surgery including the date and location.  How do I manage  my blood sugar before surgery? Check your blood sugar at least 4 times a day, starting 2 days before surgery, to make sure that the level is not too high or low.  Check your blood sugar the morning of your surgery when you wake up and every 2 hours until you get to the Short Stay unit.  If your blood sugar is less than 70 mg/dL, you will need to treat for low blood sugar: Do not take insulin. Treat a low blood sugar (less than 70 mg/dL) with  cup of clear juice (cranberry or apple), 4 glucose tablets, OR glucose gel. Recheck blood sugar in 15 minutes after treatment (to make sure it is greater than 70 mg/dL). If your blood sugar is not greater than 70 mg/dL on recheck, call [phone] for further instructions. Report your blood sugar to the short stay nurse when you get to Short Stay.  If you are admitted to the hospital after surgery: Your blood sugar will be checked by the staff and you will probably be given insulin after surgery (instead of oral diabetes medicines) to make sure you have good blood sugar levels. The goal for blood sugar control after surgery is 80-180 mg/dL.             Do not wear jewelry or makeup Do not wear lotions, powders, perfumes, or deodorant. Do not shave 48 hours prior to surgery.   Do not bring valuables to the hospital. DO Not wear nail polish, gel polish, artificial nails, or any other type of covering on natural nails including finger and toenails. If  patients have artificial nails, gel coating, etc. that need to be removed by a nail salon please have this removed prior to surgery or surgery may need to be canceled/delayed if the surgeon/ anesthesia feels like the patient is unable to be adequately monitored.               [facility] is not responsible for any belongings or valuables.  Do NOT Smoke (Tobacco/Vaping)  24 hours prior to your procedure If you use a CPAP at night, you may bring your mask for your overnight stay.   Contacts, glasses,  dentures or bridgework may not be worn into surgery, please bring cases for these belongings   For patients admitted to the hospital, discharge time will be determined by your treatment team.   Patients discharged the day of surgery will not be allowed to drive home, and someone needs to stay with them for 24 hours.  NO VISITORS WILL BE ALLOWED IN PRE-OP WHERE PATIENTS GET READY FOR SURGERY.  ONLY 1 SUPPORT PERSON MAY BE PRESENT WHILE YOU ARE IN SURGERY.  IF YOU ARE TO BE ADMITTED, ONCE YOU ARE IN YOUR ROOM YOU WILL BE ALLOWED TWO (2) VISITORS.  Minor children may have two parents present. Special consideration for safety and communication needs will be reviewed on a case by case basis.  Special instructions:    Oral Hygiene is also important to reduce your risk of infection.  Remember - BRUSH YOUR TEETH THE MORNING OF SURGERY WITH YOUR REGULAR TOOTHPASTE   - Preparing For Surgery  Before surgery, you can play an important role. Because skin is not sterile, your skin needs to be as free of germs as possible. You can reduce the number of germs on your skin by washing with CHG (chlorahexidine gluconate) Soap before surgery.  CHG is an antiseptic cleaner which kills germs and bonds with the skin to continue killing germs even after washing.     Please do not use if you have an allergy to CHG or antibacterial soaps. If your skin becomes reddened/irritated stop using the CHG.  Do not shave (including legs and underarms) for at least 48 hours prior to first CHG shower. It is OK to shave your face.  Please follow these instructions carefully.     Shower the NIGHT BEFORE SURGERY and the MORNING OF SURGERY with CHG Soap.   If you chose to wash your hair, wash your hair first as usual with your normal shampoo. After you shampoo, rinse your hair and body thoroughly to remove the shampoo.  Then [employer] and genitals (private parts) with your normal soap and rinse thoroughly to remove  soap.  After that Use CHG Soap as you would any other liquid soap. You can apply CHG directly to the skin and wash gently with a scrungie or a clean washcloth.   Apply the CHG Soap to your body ONLY FROM THE NECK DOWN.  Do not use on open wounds or open sores. Avoid contact with your eyes, ears, mouth and genitals (private parts). Wash Face and genitals (private parts)  with your normal soap.   Wash thoroughly, paying special attention to the area where your surgery will be performed.  Thoroughly rinse your body with warm water from the neck down.  DO NOT shower/wash with your normal soap after using and rinsing off the CHG Soap.  Pat yourself dry with a CLEAN TOWEL.  Wear CLEAN PAJAMAS to bed the night before surgery  Place CLEAN  SHEETS on your bed the night before your surgery  DO NOT SLEEP WITH PETS.   Day of Surgery:  Take a shower with CHG soap. Wear Clean/Comfortable clothing the morning of surgery Do not apply any deodorants/lotions.   Remember to brush your teeth WITH YOUR REGULAR TOOTHPASTE.   Please read over the following fact sheets that you were given.

## 2021-04-21 ENCOUNTER — Encounter (HOSPITAL_COMMUNITY)
Admission: RE | Admit: 2021-04-21 | Discharge: 2021-04-21 | Disposition: A | Discharge status: Home / Self Care | Payer: Medicare HMO | Source: Ambulatory Visit | Attending: General Surgery | Admitting: General Surgery

## 2021-04-21 ENCOUNTER — Encounter (HOSPITAL_COMMUNITY): Payer: Self-pay

## 2021-04-21 ENCOUNTER — Other Ambulatory Visit: Payer: Self-pay

## 2021-04-21 DIAGNOSIS — C50911 Malignant neoplasm of unspecified site of right female breast: Secondary | ICD-10-CM | POA: Diagnosis not present

## 2021-04-21 DIAGNOSIS — Z01812 Encounter for preprocedural laboratory examination: Secondary | ICD-10-CM | POA: Diagnosis not present

## 2021-04-21 DIAGNOSIS — R739 Hyperglycemia, unspecified: Secondary | ICD-10-CM | POA: Diagnosis not present

## 2021-04-21 LAB — BASIC METABOLIC PANEL
Anion gap: 9 (ref 5–15)
BUN: 21 mg/dL (ref 8–23)
CO2: 27 mmol/L (ref 22–32)
Calcium: 9.1 mg/dL (ref 8.9–10.3)
Chloride: 99 mmol/L (ref 98–111)
Creatinine, Ser: 0.9 mg/dL (ref 0.44–1.00)
GFR, Estimated: >60 mL/min (ref >60)
Glucose, Bld: 137 mg/dL — ABNORMAL HIGH (ref 70–99)
Potassium: 3.5 mmol/L (ref 3.5–5.1)
Sodium: 135 mmol/L (ref 135–145)

## 2021-04-21 LAB — CBC
HCT: 44.9 % (ref 36.0–46.0)
Hemoglobin: 14.5 g/dL (ref 12.0–15.0)
MCH: 29.7 pg (ref 26.0–34.0)
MCHC: 32.3 g/dL (ref 30.0–36.0)
MCV: 92 fL (ref 80.0–100.0)
Platelets: 303 10*3/uL (ref 150–400)
RBC: 4.88 MIL/uL (ref 3.87–5.11)
RDW: 13.4 % (ref 11.5–15.5)
WBC: 9.9 10*3/uL (ref 4.0–10.5)
nRBC: 0 % (ref 0.0–0.2)

## 2021-04-21 LAB — GLUCOSE, CAPILLARY: Glucose-Capillary: 237 mg/dL — ABNORMAL HIGH (ref 70–99)

## 2021-04-21 NOTE — Progress Notes (Signed)
PCP - Benjamine Mola Dewey,MD Cardiologist - none  PPM/ICD - denies Device Orders -  Rep Notified -   Chest x-ray - none EKG - 03/16/21 Stress Test - >10 years ago ECHO - 04/10/19 Cardiac Cath - none  Sleep Study -yes  CPAP - no  Fasting Blood Sugar - 80 Checks Blood Sugar 2 times a day  Blood Thinner Instructions:n/a Aspirin Instructions:n/a  ERAS Protcol -clear liquids until 1300 PRE-SURGERY Ensure or G2- no  COVID TEST- n/a-ambulatory surgery   Anesthesia review: no  Patient denies shortness of breath, fever, cough and chest pain at PAT appointment   All instructions explained to the patient, with a verbal understanding of the material. Patient agrees to go over the instructions while at home for a better understanding. Patient also instructed to self quarantine after being tested for COVID-19. The opportunity to ask questions was provided.

## 2021-04-29 ENCOUNTER — Ambulatory Visit (HOSPITAL_COMMUNITY): Payer: Medicare HMO | Admitting: Certified Registered Nurse Anesthetist

## 2021-04-29 ENCOUNTER — Other Ambulatory Visit: Payer: Self-pay

## 2021-04-29 ENCOUNTER — Encounter (HOSPITAL_COMMUNITY)
Admission: RE | Disposition: A | Discharge status: Home / Self Care | Payer: Self-pay | Source: Ambulatory Visit | Attending: General Surgery

## 2021-04-29 ENCOUNTER — Ambulatory Visit (HOSPITAL_COMMUNITY)
Admission: RE | Admit: 2021-04-29 | Discharge: 2021-04-29 | Disposition: A | Discharge status: Home / Self Care | Payer: Medicare HMO | Source: Ambulatory Visit | Attending: General Surgery | Admitting: General Surgery

## 2021-04-29 ENCOUNTER — Encounter (HOSPITAL_COMMUNITY): Payer: Self-pay | Admitting: General Surgery

## 2021-04-29 DIAGNOSIS — D0511 Intraductal carcinoma in situ of right breast: Secondary | ICD-10-CM | POA: Insufficient documentation

## 2021-04-29 DIAGNOSIS — E119 Type 2 diabetes mellitus without complications: Secondary | ICD-10-CM | POA: Diagnosis not present

## 2021-04-29 DIAGNOSIS — E6609 Other obesity due to excess calories: Secondary | ICD-10-CM | POA: Insufficient documentation

## 2021-04-29 DIAGNOSIS — E039 Hypothyroidism, unspecified: Secondary | ICD-10-CM | POA: Insufficient documentation

## 2021-04-29 DIAGNOSIS — Z6834 Body mass index (BMI) 34.0-34.9, adult: Secondary | ICD-10-CM | POA: Diagnosis not present

## 2021-04-29 DIAGNOSIS — Z803 Family history of malignant neoplasm of breast: Secondary | ICD-10-CM | POA: Insufficient documentation

## 2021-04-29 DIAGNOSIS — N6011 Diffuse cystic mastopathy of right breast: Secondary | ICD-10-CM | POA: Diagnosis not present

## 2021-04-29 DIAGNOSIS — Z170 Estrogen receptor positive status [ER+]: Secondary | ICD-10-CM | POA: Diagnosis not present

## 2021-04-29 DIAGNOSIS — F332 Major depressive disorder, recurrent severe without psychotic features: Secondary | ICD-10-CM | POA: Diagnosis not present

## 2021-04-29 DIAGNOSIS — N62 Hypertrophy of breast: Secondary | ICD-10-CM | POA: Diagnosis not present

## 2021-04-29 DIAGNOSIS — C50411 Malignant neoplasm of upper-outer quadrant of right female breast: Secondary | ICD-10-CM | POA: Diagnosis not present

## 2021-04-29 DIAGNOSIS — K644 Residual hemorrhoidal skin tags: Secondary | ICD-10-CM | POA: Diagnosis not present

## 2021-04-29 DIAGNOSIS — C50911 Malignant neoplasm of unspecified site of right female breast: Secondary | ICD-10-CM | POA: Diagnosis not present

## 2021-04-29 HISTORY — PX: RE-EXCISION OF BREAST LUMPECTOMY: SHX6048

## 2021-04-29 LAB — GLUCOSE, CAPILLARY: Glucose-Capillary: 90 mg/dL (ref 70–99)

## 2021-04-29 SURGERY — EXCISION, LESION, BREAST
Anesthesia: General | Site: Breast | Laterality: Right

## 2021-04-29 MED ORDER — CHLORHEXIDINE GLUCONATE CLOTH 2 % EX PADS: 6.0000 | MEDICATED_PAD | Freq: Once | CUTANEOUS | Status: DC

## 2021-04-29 MED ORDER — DEXAMETHASONE SODIUM PHOSPHATE 10 MG/ML IJ SOLN
INTRAMUSCULAR | Status: DC | PRN
  Administered 2021-04-29: 4 mg via INTRAVENOUS

## 2021-04-29 MED ORDER — ORAL CARE MOUTH RINSE: 15.0000 mL | Freq: Once | OROMUCOSAL | Status: AC

## 2021-04-29 MED ORDER — LIDOCAINE HCL (PF) 1 % IJ SOLN
INTRAMUSCULAR | Status: AC
  Filled 2021-04-29: qty 30

## 2021-04-29 MED ORDER — LACTATED RINGERS IV SOLN: INTRAVENOUS | Status: DC

## 2021-04-29 MED ORDER — LIDOCAINE HCL 1 % IJ SOLN
INTRAMUSCULAR | Status: DC | PRN
  Administered 2021-04-29: 30 mL via INTRAMUSCULAR

## 2021-04-29 MED ORDER — LIDOCAINE 2% (20 MG/ML) 5 ML SYRINGE
INTRAMUSCULAR | Status: DC | PRN
  Administered 2021-04-29: 80 mg via INTRAVENOUS

## 2021-04-29 MED ORDER — MIDAZOLAM HCL 2 MG/2ML IJ SOLN
INTRAMUSCULAR | Status: DC | PRN
  Administered 2021-04-29: 2 mg via INTRAVENOUS

## 2021-04-29 MED ORDER — FENTANYL CITRATE (PF) 250 MCG/5ML IJ SOLN
INTRAMUSCULAR | Status: AC
  Filled 2021-04-29: qty 5

## 2021-04-29 MED ORDER — MIDAZOLAM HCL 2 MG/2ML IJ SOLN: INTRAMUSCULAR | Status: DC | PRN

## 2021-04-29 MED ORDER — GABAPENTIN 100 MG PO CAPS
ORAL_CAPSULE | ORAL | Status: AC
  Filled 2021-04-29: qty 2

## 2021-04-29 MED ORDER — ONDANSETRON HCL 4 MG/2ML IJ SOLN
INTRAMUSCULAR | Status: DC | PRN
  Administered 2021-04-29: 4 mg via INTRAVENOUS

## 2021-04-29 MED ORDER — FENTANYL CITRATE (PF) 250 MCG/5ML IJ SOLN
INTRAMUSCULAR | Status: DC | PRN
  Administered 2021-04-29 (×3): 50 ug via INTRAVENOUS

## 2021-04-29 MED ORDER — 0.9 % SODIUM CHLORIDE (POUR BTL) OPTIME
TOPICAL | Status: DC | PRN
  Administered 2021-04-29: 1000 mL

## 2021-04-29 MED ORDER — GABAPENTIN 100 MG PO CAPS
200.0000 mg | ORAL_CAPSULE | ORAL | Status: AC
  Administered 2021-04-29: 200 mg via ORAL

## 2021-04-29 MED ORDER — CHLORHEXIDINE GLUCONATE 0.12 % MT SOLN
15.0000 mL | Freq: Once | OROMUCOSAL | Status: AC
  Administered 2021-04-29: 15 mL via OROMUCOSAL

## 2021-04-29 MED ORDER — BUPIVACAINE-EPINEPHRINE (PF) 0.25% -1:200000 IJ SOLN
INTRAMUSCULAR | Status: AC
  Filled 2021-04-29: qty 30

## 2021-04-29 MED ORDER — SCOPOLAMINE 1 MG/3DAYS TD PT72: 1.0000 | MEDICATED_PATCH | TRANSDERMAL | Status: DC

## 2021-04-29 MED ORDER — SCOPOLAMINE 1 MG/3DAYS TD PT72
MEDICATED_PATCH | TRANSDERMAL | Status: AC
  Administered 2021-04-29: 1.5 mg via TRANSDERMAL
  Filled 2021-04-29: qty 1

## 2021-04-29 MED ORDER — CHLORHEXIDINE GLUCONATE 0.12 % MT SOLN
OROMUCOSAL | Status: AC
  Filled 2021-04-29: qty 15

## 2021-04-29 MED ORDER — CEFAZOLIN SODIUM-DEXTROSE 2-4 GM/100ML-% IV SOLN
2.0000 g | INTRAVENOUS | Status: AC
  Administered 2021-04-29: 2 g via INTRAVENOUS

## 2021-04-29 MED ORDER — FENTANYL CITRATE (PF) 250 MCG/5ML IJ SOLN: INTRAMUSCULAR | Status: DC | PRN

## 2021-04-29 MED ORDER — PROPOFOL 10 MG/ML IV BOLUS
INTRAVENOUS | Status: AC
  Filled 2021-04-29: qty 20

## 2021-04-29 MED ORDER — CEFAZOLIN SODIUM-DEXTROSE 2-4 GM/100ML-% IV SOLN
INTRAVENOUS | Status: AC
  Filled 2021-04-29: qty 100

## 2021-04-29 MED ORDER — PROPOFOL 10 MG/ML IV BOLUS
INTRAVENOUS | Status: DC | PRN
  Administered 2021-04-29: 150 mg via INTRAVENOUS

## 2021-04-29 MED ORDER — MIDAZOLAM HCL 2 MG/2ML IJ SOLN
INTRAMUSCULAR | Status: AC
  Filled 2021-04-29: qty 2

## 2021-04-29 SURGICAL SUPPLY — 39 items
BAG COUNTER SPONGE SURGICOUNT (BAG) ×2 IMPLANT
BINDER BREAST LRG (GAUZE/BANDAGES/DRESSINGS) IMPLANT
BINDER BREAST XLRG (GAUZE/BANDAGES/DRESSINGS) ×2 IMPLANT
CANISTER SUCT 3000ML PPV (MISCELLANEOUS) ×2 IMPLANT
CHLORAPREP W/TINT 26 (MISCELLANEOUS) ×2 IMPLANT
CLIP VESOCCLUDE LG 6/CT (CLIP) IMPLANT
CNTNR URN SCR LID CUP LEK RST (MISCELLANEOUS) ×1 IMPLANT
CONT SPEC 4OZ STRL OR WHT (MISCELLANEOUS) ×2
COVER SURGICAL LIGHT HANDLE (MISCELLANEOUS) ×2 IMPLANT
DERMABOND ADVANCED (GAUZE/BANDAGES/DRESSINGS) ×1
DERMABOND ADVANCED .7 DNX12 (GAUZE/BANDAGES/DRESSINGS) ×1 IMPLANT
DRAPE CHEST BREAST 15X10 FENES (DRAPES) ×2 IMPLANT
DRSG PAD ABDOMINAL 8X10 ST (GAUZE/BANDAGES/DRESSINGS) ×2 IMPLANT
ELECT REM PT RETURN 9FT ADLT (ELECTROSURGICAL) ×2
ELECTRODE REM PT RTRN 9FT ADLT (ELECTROSURGICAL) ×1 IMPLANT
GAUZE SPONGE 4X4 12PLY STRL (GAUZE/BANDAGES/DRESSINGS) ×2 IMPLANT
GLOVE SURG ENC MOIS LTX SZ6 (GLOVE) ×2 IMPLANT
GLOVE SURG UNDER LTX SZ6.5 (GLOVE) ×2 IMPLANT
GOWN STRL REUS W/ TWL LRG LVL3 (GOWN DISPOSABLE) ×1 IMPLANT
GOWN STRL REUS W/TWL 2XL LVL3 (GOWN DISPOSABLE) ×2 IMPLANT
GOWN STRL REUS W/TWL LRG LVL3 (GOWN DISPOSABLE) ×2
ILLUMINATOR WAVEGUIDE N/F (MISCELLANEOUS) IMPLANT
KIT BASIN OR (CUSTOM PROCEDURE TRAY) ×2 IMPLANT
KIT MARKER MARGIN INK (KITS) ×2 IMPLANT
KIT TURNOVER KIT B (KITS) ×2 IMPLANT
LIGHT WAVEGUIDE WIDE FLAT (MISCELLANEOUS) IMPLANT
NEEDLE HYPO 25GX1X1/2 BEV (NEEDLE) ×2 IMPLANT
NS IRRIG 1000ML POUR BTL (IV SOLUTION) ×2 IMPLANT
PACK GENERAL/GYN (CUSTOM PROCEDURE TRAY) ×2 IMPLANT
PAD ARMBOARD 7.5X6 YLW CONV (MISCELLANEOUS) ×2 IMPLANT
PENCIL SMOKE EVACUATOR (MISCELLANEOUS) ×2 IMPLANT
STRIP CLOSURE SKIN 1/2X4 (GAUZE/BANDAGES/DRESSINGS) ×2 IMPLANT
SUT MNCRL AB 4-0 PS2 18 (SUTURE) ×2 IMPLANT
SUT SILK 2 0 PERMA HAND 18 BK (SUTURE) IMPLANT
SUT VIC AB 3-0 SH 27 (SUTURE) ×1
SUT VIC AB 3-0 SH 27X BRD (SUTURE) ×1 IMPLANT
SYR CONTROL 10ML LL (SYRINGE) ×2 IMPLANT
TOWEL GREEN STERILE (TOWEL DISPOSABLE) ×2 IMPLANT
TOWEL GREEN STERILE FF (TOWEL DISPOSABLE) ×2 IMPLANT

## 2021-04-29 NOTE — Transfer of Care (Signed)
Immediate Anesthesia Transfer of Care Note  Patient: Carol [name]  Procedure(s) Performed: RE-EXCISION OF RIGHT BREAST LUMPECTOMY (Right: Breast)  Patient Location: PACU  Anesthesia Type:General  Level of Consciousness: awake and alert   Airway & Oxygen Therapy: Patient Spontanous Breathing and Patient connected to face mask oxygen  Post-op Assessment: Report given to RN and Post -op Vital signs reviewed and stable  Post vital signs: Reviewed and stable  Last Vitals:  Vitals Value Taken Time  BP 147/67 04/29/21 1455  Temp 36.6 C 04/29/21 1456  Pulse 90 04/29/21 1500  Resp 11 04/29/21 1500  SpO2 98 % 04/29/21 1500  Vitals shown include unvalidated device data.  Last Pain:  Vitals:   04/29/21 1456  TempSrc:   PainSc: 5          Complications: No notable events documented.

## 2021-04-29 NOTE — Discharge Instructions (Addendum)
[facility] Office Phone Number [phone]  BREAST BIOPSY/ PARTIAL MASTECTOMY: POST OP INSTRUCTIONS  Always review your discharge instruction sheet given to you by the facility where your surgery was performed.  IF YOU HAVE DISABILITY OR FAMILY LEAVE FORMS, YOU MUST BRING THEM TO THE OFFICE FOR PROCESSING.  DO NOT GIVE THEM TO YOUR DOCTOR.  A prescription for pain medication may be given to you upon discharge.  Take your pain medication as prescribed, if needed.  If narcotic pain medicine is not needed, then you may take acetaminophen (Tylenol) or ibuprofen (Advil) as needed. Take your usually prescribed medications unless otherwise directed If you need a refill on your pain medication, please contact your pharmacy.  They will contact our office to request authorization.  Prescriptions will not be filled after 5pm or on week-ends. You should eat very light the first 24 hours after surgery, such as soup, crackers, pudding, etc.  Resume your normal diet the day after surgery. Most patients will experience some swelling and bruising in the breast.  Ice packs and a good support bra will help.  Swelling and bruising can take several days to resolve.  It is common to experience some constipation if taking pain medication after surgery.  Increasing fluid intake and taking a stool softener will usually help or prevent this problem from occurring.  A mild laxative (Milk of Magnesia or Miralax) should be taken according to package directions if there are no bowel movements after 48 hours. Unless discharge instructions indicate otherwise, you may remove your bandages 48 hours after surgery, and you may shower at that time.  You may have steri-strips (small skin tapes) in place directly over the incision.  These strips should be left on the skin for 7-10 days.   Any sutures or staples will be removed at the office during your follow-up visit. ACTIVITIES:  You may resume regular daily activities  (gradually increasing) beginning the next day.  Wearing a good support bra or sports bra (or the breast binder) minimizes pain and swelling.  You may have sexual intercourse when it is comfortable. You may drive when you no longer are taking prescription pain medication, you can comfortably wear a seatbelt, and you can safely maneuver your car and apply brakes. RETURN TO WORK:  __________1 week_______________ You should see your doctor in the office for a follow-up appointment approximately two weeks after your surgery.  Your doctor's nurse will typically make your follow-up appointment when she calls you with your pathology report.  Expect your pathology report 2-3 business days after your surgery.  You may call to check if you do not hear from us after three days.   WHEN TO CALL YOUR DOCTOR: Fever over 101.0 Nausea and/or vomiting. Extreme swelling or bruising. Continued bleeding from incision. Increased pain, redness, or drainage from the incision.  The clinic staff is available to answer your questions during regular business hours.  Please don't hesitate to call and ask to speak to one of the nurses for clinical concerns.  If you have a medical emergency, go to the nearest emergency room or call 911.  A surgeon from [facility] is always on call at the hospital.  For further questions, please visit [email or web address]   

## 2021-04-29 NOTE — Op Note (Signed)
Re-excisional right Breast Lumpectomy   Indications: This patient presents with history of positive margins after partial mastectomy for right breast cancer   Pre-operative Diagnosis: right breast cancer   Post-operative Diagnosis: right breast cancer   Surgeon: Stark Klein   Assistants: n/a   Anesthesia: General anesthesia and Local   ASA Class: 2   Procedure Details  The patient was seen in the Holding Room. The risks, benefits, complications, treatment options, and expected outcomes were discussed with the patient. The possibilities of reaction to medication, pulmonary aspiration, bleeding, infection, the need for additional procedures, failure to diagnose a condition, and creating a complication requiring transfusion or operation were discussed with the patient. The patient concurred with the proposed plan, giving informed consent. The site of surgery properly noted/marked. The patient was taken to Operating Room # 2, identified, and the procedure verified as re-excision of right breast cancer.  After induction of anesthesia, the right breast and chest were prepped and draped in standard fashion. Local anesthetic was infiltrated into the breast. The lumpectomy was performed by reopening the prior lateral transverse incision. Seroma was aspirated. Additional margin was taken at the posterior border of the partial mastectomy cavity. The specimen was marked with the margin marker paint kit. Hemostasis was achieved with cautery. The wound was irrigated and closed with a 3-0 Vicryl deep dermal interrupted and a 4-0 Monocryl subcuticular closure in layers.  Sterile dressings were applied. At the end of the operation, all sponge, instrument, and needle counts were correct.   Findings:  grossly clear surgical margins   Estimated Blood Loss: Minimal   Drains: none   Specimens: additional posterior margins.   Complications: None; patient tolerated the procedure well.   Disposition: PACU -  hemodynamically stable.   Condition: stable

## 2021-04-29 NOTE — H&P (Signed)
   PROVIDER: Georgianne Fick, MD  MRN: [id number] DOB: Aug 24, 1955 DATE OF ENCOUNTER: 04/12/2021 Interval History:  Pt is s/p right seed localized lumpectomy and seed loc exc biopsy 03/25/21. The excisional bx was benign. The posterior margin was positive for DCIS. She is doing pretty well other than some soreness. She denies fever/chills. She prefers the post op bras from [employer].   Physical Examination:   Right breast with mild swelling. No significant seroma. Can feel suture knot. No erythema  Assessment and Plan:   Carol [name] is a 65 y.o. female who underwent right lumpectomy with seed on 03/25/21.  Diagnoses and all orders for this visit:  Malignant neoplasm of upper-outer quadrant of right breast in female, estrogen receptor positive (CMS-HCC) Assessment & Plan: Reexcision planned for 04/29/2021.  Follow up with me around 2 weeks after that.   XRT and antihormonal treatment planned post op.

## 2021-04-29 NOTE — Anesthesia Procedure Notes (Signed)
Procedure Name: LMA Insertion Date/Time: 04/29/2021 2:00 PM Performed by: Charyl Dancer, RN Pre-anesthesia Checklist: Patient identified, Emergency Drugs available, Suction available and Patient being monitored Patient Re-evaluated:Patient Re-evaluated prior to induction Oxygen Delivery Method: Circle System Utilized Preoxygenation: Pre-oxygenation with 100% oxygen Induction Type: IV induction LMA: LMA inserted LMA Size: 4.0 Number of attempts: 1 Placement Confirmation: positive ETCO2 Tube secured with: Tape Dental Injury: Teeth and Oropharynx as per pre-operative assessment

## 2021-04-29 NOTE — Interval H&P Note (Signed)
History and Physical Interval Note:  04/29/2021 12:51 PM  Carol [name]  has presented today for surgery, with the diagnosis of RIGHT BREAST CANCER.  The various methods of treatment have been discussed with the patient and family. After consideration of risks, benefits and other options for treatment, the patient has consented to  Procedure(s): RE-EXCISION OF RIGHT BREAST LUMPECTOMY (Right) as a surgical intervention.  The patient's history has been reviewed, patient examined, no change in status, stable for surgery.  I have reviewed the patient's chart and labs.  Questions were answered to the patient's satisfaction.     Stark Klein

## 2021-04-29 NOTE — Progress Notes (Signed)
Notified Dr. Lanetta Inch of pt's CBG -237. No orders at this time.

## 2021-04-29 NOTE — Anesthesia Preprocedure Evaluation (Addendum)
Anesthesia Evaluation  Patient identified by MRN, date of birth, ID band Patient awake    Reviewed: Allergy & Precautions, NPO status , Patient's Chart, lab work & pertinent test results  History of Anesthesia Complications (+) PONV  Airway Mallampati: II  TM Distance: >3 FB Neck ROM: Full    Dental no notable dental hx. (+) Teeth Intact, Dental Advisory Given   Pulmonary neg pulmonary ROS,    Pulmonary exam normal breath sounds clear to auscultation       Cardiovascular negative cardio ROS Normal cardiovascular exam Rhythm:Regular Rate:Normal  TTE 2020 1. The left ventricle has normal systolic function with an ejection  fraction of 60-65%. The cavity size was normal. Left ventricular diastolic  Doppler parameters are consistent with impaired relaxation. No evidence of  left ventricular regional wall  motion abnormalities.  2. The right ventricle has normal systolic function. The cavity was  normal. There is No increase in right ventricular wall thickness.  3. Left atrial size was normal.  4. Right atrial size was normal.  5. No evidence of mitral valve stenosis.  6. The aortic valve is tricuspid. Aortic valve regurgitation was not  visualized by color flow Doppler.  7. The pulmonic valve was grossly normal. Pulmonic valve regurgitation is  not visualized by color flow Doppler.  8. The aorta is normal unless otherwise noted   Neuro/Psych PSYCHIATRIC DISORDERS Anxiety Depression negative neurological ROS     GI/Hepatic negative GI ROS, Neg liver ROS,   Endo/Other  diabetes, Type 2Hypothyroidism   Renal/GU negative Renal ROS  negative genitourinary   Musculoskeletal  (+) Arthritis ,   Abdominal   Peds  Hematology negative hematology ROS (+)   Anesthesia Other Findings Right breast CA  Reproductive/Obstetrics                           Anesthesia Physical Anesthesia Plan  ASA:  2  Anesthesia Plan: General   Post-op Pain Management:    Induction: Intravenous  PONV Risk Score and Plan: 4 or greater and Ondansetron, Dexamethasone, Midazolam and Scopolamine patch - Pre-op  Airway Management Planned: LMA  Additional Equipment:   Intra-op Plan:   Post-operative Plan: Extubation in OR  Informed Consent: I have reviewed the patients History and Physical, chart, labs and discussed the procedure including the risks, benefits and alternatives for the proposed anesthesia with the patient or authorized representative who has indicated his/her understanding and acceptance.     Dental advisory given  Plan Discussed with: CRNA  Anesthesia Plan Comments:        Anesthesia Quick Evaluation

## 2021-04-30 ENCOUNTER — Encounter (HOSPITAL_COMMUNITY): Payer: Self-pay | Admitting: General Surgery

## 2021-04-30 NOTE — Anesthesia Postprocedure Evaluation (Signed)
Anesthesia Post Note  Patient: Carol [name]  Procedure(s) Performed: RE-EXCISION OF RIGHT BREAST LUMPECTOMY (Right: Breast)     Patient location during evaluation: PACU Anesthesia Type: General Level of consciousness: awake and alert Pain management: pain level controlled Vital Signs Assessment: post-procedure vital signs reviewed and stable Respiratory status: spontaneous breathing, nonlabored ventilation, respiratory function stable and patient connected to nasal cannula oxygen Cardiovascular status: blood pressure returned to baseline and stable Postop Assessment: no apparent nausea or vomiting Anesthetic complications: no   No notable events documented.  Last Vitals:  Vitals:   04/29/21 1510 04/29/21 1525  BP: (!) 156/77 (!) 159/84  Pulse: 87 83  Resp: 11 14  Temp:  36.9 C  SpO2: 96% 95%    Last Pain:  Vitals:   04/29/21 1510  TempSrc:   PainSc: 3                  Layali Freund L Nil Bolser

## 2021-05-03 LAB — SURGICAL PATHOLOGY

## 2021-05-04 ENCOUNTER — Encounter: Payer: Self-pay | Admitting: *Deleted

## 2021-05-18 DIAGNOSIS — F411 Generalized anxiety disorder: Secondary | ICD-10-CM | POA: Diagnosis not present

## 2021-05-18 DIAGNOSIS — G4700 Insomnia, unspecified: Secondary | ICD-10-CM | POA: Diagnosis not present

## 2021-05-18 DIAGNOSIS — L749 Eccrine sweat disorder, unspecified: Secondary | ICD-10-CM | POA: Diagnosis not present

## 2021-05-18 DIAGNOSIS — F331 Major depressive disorder, recurrent, moderate: Secondary | ICD-10-CM | POA: Diagnosis not present

## 2021-05-18 NOTE — Progress Notes (Signed)
Radiation Oncology         [phone] ________________________________  Name: Carol [name] MRN: [id number]  Date: 05/19/2021  DOB: [birth year]  Follow-Up Visit Note  Outpatient  CC: Carol Bien, MD  Nicholas Lose, MD  Diagnosis:      ICD-10-CM   1. Ductal carcinoma in situ (DCIS) of right breast  D05.11 Ambulatory referral to Social Work      Stage 0 (DCIS), Right Breast, Intermediate to high-grade ductal carcinoma in-situ, ER+ / PR+  CHIEF COMPLAINT: Here to discuss management of right breast DCIS  Narrative:  The patient returns today for follow-up.     Since breast clinic consultation date of 02/10/21, she underwent genetic testing on 02/10/21. Results were negative, showing no clinically significant variants detected.    Bilateral breast MRI on 02/16/21 revealed post biopsy changes in the right breast, and no suspicious enhancing mass or definite abnormal non mass enhancement. No MRI evidence of malignancy was seen within the left breast. (Of note: patient had moderate heterogeneous background enhancement which limited evaluation in both breasts)  The patient opted to proceed with right breast lumpectomy on the date of 03/25/21. Pathology revealed histology of intermediate to high-grade ductal carcinoma in-situ with necrosis, DCIS measuring 0.7 cm; margin status to in situ disease of DCIS involving the posterior margin. Additional right lateral lumpectomy performed revealed benign breast tissue with patchy fibrosis and no carcinoma identified.  ER status: >95% positive; PR status 30% positive, both with strong staining intensity.  Due to positive margins on lumpectomy, the patient underwent excision of the right posterior margin on 04/29/21. Pathology from the procedure revealed intermediate grade DCIS  measuring 0.5 cm, with DCIS noted as focally 0.4 cm from the final posterior margin. Fibrocystic changes were also noted, with usual ductal hyperplasia and calcifications.    Symptomatically, the patient reports: Doing well.  She has stressors in her family and is very devoted to taking care of her grandchildren.  Social work referral has been made and the patient is enthusiastic about talking with them for an extra layer support.  She also reports some palpitations which her PCP is addressing.  I explained that we will defer to her PCP for treatment of cardiac related issues.        ALLERGIES:  is allergic to acetaminophen, celebrex [celecoxib], povidone iodine, tape, codeine, and doxycycline.  Meds: Current Outpatient Medications  Medication Sig Dispense Refill   oxybutynin (DITROPAN-XL) 10 MG 24 hr tablet Take 10 mg by mouth at bedtime.     ALPRAZolam (XANAX) 0.25 MG tablet Take 0.25 mg by mouth daily as needed for anxiety.     Ascorbic Acid (VITAMIN C) 1000 MG tablet Take 1,000 mg by mouth daily.     baclofen (LIORESAL) 10 MG tablet Take 10 mg by mouth 3 (three) times daily.     Biotin 10 MG CAPS Take 10 mg by mouth daily.     levothyroxine (SYNTHROID) 88 MCG tablet Take 88 mcg by mouth daily before breakfast.     ondansetron (ZOFRAN-ODT) 8 MG disintegrating tablet Take 8 mg by mouth every 8 (eight) hours as needed for vomiting or nausea.     oxyCODONE (OXY IR/ROXICODONE) 5 MG immediate release tablet Take 1 tablet (5 mg total) by mouth every 6 (six) hours as needed for severe pain. (Patient taking differently: Take 5 mg by mouth in the morning, at noon, and at bedtime.) 20 tablet 0   Semaglutide,0.25 or 0.5MG /DOS, (OZEMPIC, 0.25 OR 0.5 MG/DOSE,)  2 MG/1.5ML SOPN Inject 0.5 mg into the skin every Sunday.     traZODone (DESYREL) 50 MG tablet Take 50 mg by mouth at bedtime as needed for sleep.     venlafaxine XR (EFFEXOR-XR) 75 MG 24 hr capsule Take 75 mg by mouth daily with breakfast.     No current facility-administered medications for this encounter.    Physical Findings:  height is 5' 6.5" (1.689 m) and weight is 221 lb (100.2 kg). Her temperature is  97.9 F (36.6 C). Her blood pressure is 132/87 and her pulse is 77. Her respiration is 20 and oxygen saturation is 98%. .     General: Alert and oriented, in no acute distress Breast exam reveals excellent healing at right lateral lumpectomy scar  Lab Findings: Lab Results  Component Value Date   WBC 9.9 04/21/2021   HGB 14.5 04/21/2021   HCT 44.9 04/21/2021   MCV 92.0 04/21/2021   PLT 303 04/21/2021    Radiographic Findings: No results found.  Impression/Plan: This is a very pleasant 65 year old woman with ER positive intermediate to high-grade DCIS  We discussed adjuvant radiotherapy today.  I recommend 4 weeks of adjuvant radiation therapy to the right breast in order to reduce risk of local regional recurrence by half.  I reviewed the logistics, benefits, risks, and potential side effects of this treatment in detail. Risks may include but not necessary be limited to acute and late injury tissue in the radiation fields such as skin irritation (change in color/pigmentation, itching, dryness, pain, peeling). She may experience fatigue. We also discussed possible risk of long term cosmetic changes or scar tissue. There is also a smaller risk for lung toxicity,  brachial plexopathy, lymphedema, musculoskeletal changes, rib fragility or induction of a second malignancy, late chronic non-healing soft tissue wound.  She understands that radiation does not pose a risk to her heart given that this is a right-sided DCIS.  The patient asked good questions which I answered to her satisfaction. She is enthusiastic about proceeding with treatment. A consent form has been signed and placed in her chart.  We will proceed with treatment planning today and start her treatment next week.   Social work referral has been placed for an extra layer of support given her social stressors at home/with her family  She understands that cardiac work-up and medications will be deferred to her PCP.  On date of  service, in total, I spent 35 minutes on this encounter. Patient was seen in person.  _____________________________________   Eppie Gibson, MD  This document serves as a record of services personally performed by Eppie Gibson, MD. It was created on her behalf by Roney Mans, a trained medical scribe. The creation of this record is based on the scribe's personal observations and the provider's statements to them. This document has been checked and approved by the attending provider.

## 2021-05-19 ENCOUNTER — Ambulatory Visit
Admission: RE | Admit: 2021-05-19 | Discharge: 2021-05-19 | Disposition: A | Discharge status: Home / Self Care | Payer: Medicare HMO | Source: Ambulatory Visit | Attending: Radiation Oncology | Admitting: Radiation Oncology

## 2021-05-19 ENCOUNTER — Other Ambulatory Visit: Payer: Self-pay

## 2021-05-19 ENCOUNTER — Encounter: Payer: Self-pay | Admitting: Radiation Oncology

## 2021-05-19 VITALS — BP 132/87 | HR 77 | Temp 97.9°F | Resp 20 | Ht 66.5 in | Wt 221.0 lb

## 2021-05-19 DIAGNOSIS — D0511 Intraductal carcinoma in situ of right breast: Secondary | ICD-10-CM

## 2021-05-19 DIAGNOSIS — Z170 Estrogen receptor positive status [ER+]: Secondary | ICD-10-CM | POA: Diagnosis not present

## 2021-05-19 DIAGNOSIS — R002 Palpitations: Secondary | ICD-10-CM | POA: Diagnosis not present

## 2021-05-19 DIAGNOSIS — Z79899 Other long term (current) drug therapy: Secondary | ICD-10-CM | POA: Diagnosis not present

## 2021-05-19 NOTE — Progress Notes (Signed)
Location of Breast Cancer:  Malignant neoplasm of upper-outer quadrant of right breast, estrogen receptor positive   Histology per Pathology Report:  03/25/2021 FINAL MICROSCOPIC DIAGNOSIS:  A. BREAST, RIGHT LATERAL, LUMPECTOMY:  -  Benign breast tissue with patchy fibrosis  -  No carcinoma identified  B. BREAST, RIGHT, LUMPECTOMY:  -  Ductal carcinoma in situ, intermediate to high grade, 0.7 cm  -  Carcinoma in situ involves the inked posterior margin  -  Previous biopsy site changes   Receptor Status: ER(95%), PR (30%)  Did patient present with symptoms (if so, please note symptoms) or was this found on screening mammography?: Screening mammogram on 01/10/21 showed a possible mass and calcifications in the right breast. Diagnostic mammogram and [place] on 02/02/21 showed suspicious right breast calcifications and right breast cysts  Past/Anticipated interventions by surgeon, if any:  04/29/2021 --Dr. Stark Klein Re-excisional right Breast Lumpectomy   03/25/2021 --Dr. Stark Klein Right Breast Radioactive seed bracketed lumpectomy, right seed localized excisional biopsy  Past/Anticipated interventions by medical oncology, if any:  Under care of Dr. Nicholas Lose 04/05/2021 --Recommendation: Breast conserving surgery: 03/25/2021: Right lumpectomy: Intermediate grade high-grade DCIS 0.7 cm, involves the posterior margin.  Right lateral lumpectomy: Benign, ER greater than 95%, PR 30% Patient will undergo additional surgery for clearance of the posterior margin. Followed by adjuvant radiation therapy Followed by antiestrogen therapy with tamoxifen 5 years  Lymphedema issues, if any:  Patient denies    Pain issues, if any:  Chronic lower back pain (difficult for patient to lie flat)  SAFETY ISSUES: Prior radiation? No Pacemaker/ICD? No Possible current pregnancy? No--hysterectomy  Is the patient on methotrexate? No  Current Complaints / other details:  Reports she's started  experiencing heart fluttering since this past Sunday. States her PCP is aware and if willing to investigate if oncology team would prefer

## 2021-05-20 ENCOUNTER — Encounter: Payer: Self-pay | Admitting: General Practice

## 2021-05-20 ENCOUNTER — Other Ambulatory Visit: Payer: Self-pay | Admitting: *Deleted

## 2021-05-20 DIAGNOSIS — D0511 Intraductal carcinoma in situ of right breast: Secondary | ICD-10-CM

## 2021-05-20 NOTE — Progress Notes (Signed)
Per MD request referral placed to Dr. Michail Sermon with psychology for therapy related to anxiety with cancer diagnosis.

## 2021-05-20 NOTE — Progress Notes (Signed)
[name]  Initial Assessment   Carol [name] is a 65 y.o. year old female contacted by phone. Clinical Social Work was referred by radiation oncology for assessment of psychosocial needs. "Im OK so far, was OK through the whole ordeal w through two surgeries."  SDOH (Social Determinants of Health) assessments performed: Yes SDOH Interventions    Flowsheet Row Most Recent Value  SDOH Interventions   Food Insecurity Interventions Intervention Not Indicated  Financial Strain Interventions Intervention Not Indicated  Housing Interventions Intervention Not Indicated  Stress Interventions Provide Counseling  Social Connections Interventions Intervention Not Indicated  Transportation Interventions Intervention Not Indicated  Depression Interventions/Treatment  Counseling       Distress Screen completed: Yes ONCBCN DISTRESS SCREENING 05/19/2021  Screening Type Initial Screening  Distress experienced in past week (1-10) 9  Family Problem type Partner;Children  Emotional problem type Depression;Nervousness/Anxiety;Adjusting to illness;Isolation/feeling alone  Spiritual/Religous concerns type Loss of sense of purpose  Physical Problem type Pain;Nausea/vomiting;Sleep/insomnia;Tingling hands/feet;Skin dry/itchy;Swollen arms/legs  Physician notified of physical symptoms Yes  Referral to clinical psychology No  Referral to clinical social work Yes  Referral to dietition No  Referral to financial advocate No  Referral to support programs Yes  Referral to palliative care No      Family/Social Information:  Housing Arrangement: patient lives with husband Family members/support persons in your life? Family - grandchildren's care is a significant stress for patient.  Has grandchildren every weekend.  "All of this caused me to go into depression and anxiety attacks."  Is getting meds management from her PCP.  Anxiety attacks have been more frequent since cancer surgeries.  "My  daughter is really my biggest support system - she has accompanied me to most appointments."  Husband works long hours and is not available to assist.  She gets more anxious when family member is not available to come with her to appointments/procedures.  Transportation concerns: no  Employment: Legally disabled. Income source: Banker concerns: No Type of concern: None Food access concerns: no Religious or spiritual practice: yes, does not attend church, has regular contact w pastor and friends Medication Concerns: no  Services Currently in place:  none  Coping/ Adjustment to diagnosis: Patient understands treatment plan and what happens next? yes, diagnosed with DCIS, had mammogram (had not had one since 2017).  "The day she told me, I was not upset.  Ive had so much going on with me - 4 back surgeries, total knee replacement."  Felt like "this was just another surgery."  Is currently having panic attacks which predate the cancer diagnosis.  Concerns about grandchildren (ages 44 and 43) - she cares for them when parents are working.   Concerns about diagnosis and/or treatment:  extreme anxiety during medical procedures/surgery; crying spell before first radiation treatment Patient reported stressors: Children, Anxiety, and grandchildren's care needs. 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  and Motivation for treatment/growth     SUMMARY: Current SDOH Barriers:  Family and relationship dysfunction  Interventions: Discussed common feeling and emotions when being diagnosed with cancer, and the importance of support during treatment Informed patient of the support team roles and support services at [facility] Provided [place] contact information and encouraged patient to call  with any questions or concerns Provided patient with information about referral process  for [place]   Follow Up Plan: Patient will contact CSW with any support or resource needs Patient verbalizes understanding of plan: yes    Beverely Pace , [place], [place] Worker Phone:  [phone]

## 2021-05-25 ENCOUNTER — Encounter: Payer: Self-pay | Admitting: *Deleted

## 2021-05-25 DIAGNOSIS — D0511 Intraductal carcinoma in situ of right breast: Secondary | ICD-10-CM | POA: Diagnosis not present

## 2021-05-25 DIAGNOSIS — M25562 Pain in left knee: Secondary | ICD-10-CM | POA: Diagnosis not present

## 2021-05-26 ENCOUNTER — Ambulatory Visit
Admission: RE | Admit: 2021-05-26 | Discharge: 2021-05-26 | Disposition: A | Discharge status: Home / Self Care | Payer: Medicare HMO | Source: Ambulatory Visit | Attending: Radiation Oncology | Admitting: Radiation Oncology

## 2021-05-26 ENCOUNTER — Other Ambulatory Visit: Payer: Self-pay

## 2021-05-26 DIAGNOSIS — D0511 Intraductal carcinoma in situ of right breast: Secondary | ICD-10-CM | POA: Diagnosis not present

## 2021-05-27 ENCOUNTER — Ambulatory Visit
Admission: RE | Admit: 2021-05-27 | Discharge: 2021-05-27 | Disposition: A | Discharge status: Home / Self Care | Payer: Medicare HMO | Source: Ambulatory Visit | Attending: Radiation Oncology | Admitting: Radiation Oncology

## 2021-05-27 DIAGNOSIS — D0511 Intraductal carcinoma in situ of right breast: Secondary | ICD-10-CM | POA: Diagnosis not present

## 2021-05-28 ENCOUNTER — Other Ambulatory Visit: Payer: Self-pay

## 2021-05-28 ENCOUNTER — Ambulatory Visit
Admission: RE | Admit: 2021-05-28 | Discharge: 2021-05-28 | Disposition: A | Discharge status: Home / Self Care | Payer: Medicare HMO | Source: Ambulatory Visit | Attending: Radiation Oncology | Admitting: Radiation Oncology

## 2021-05-28 DIAGNOSIS — D0511 Intraductal carcinoma in situ of right breast: Secondary | ICD-10-CM | POA: Diagnosis not present

## 2021-05-31 ENCOUNTER — Ambulatory Visit
Admission: RE | Admit: 2021-05-31 | Discharge: 2021-05-31 | Disposition: A | Discharge status: Home / Self Care | Payer: Medicare HMO | Source: Ambulatory Visit | Attending: Radiation Oncology | Admitting: Radiation Oncology

## 2021-05-31 ENCOUNTER — Other Ambulatory Visit: Payer: Self-pay

## 2021-05-31 DIAGNOSIS — D0511 Intraductal carcinoma in situ of right breast: Secondary | ICD-10-CM

## 2021-05-31 DIAGNOSIS — L600 Ingrowing nail: Secondary | ICD-10-CM | POA: Diagnosis not present

## 2021-05-31 MED ORDER — RADIAPLEXRX EX GEL: Freq: Once | CUTANEOUS | Status: AC

## 2021-05-31 MED ORDER — ALRA NON-METALLIC DEODORANT (RAD-ONC)
1.0000 "application " | Freq: Once | TOPICAL | Status: AC
  Administered 2021-05-31: 1 via TOPICAL

## 2021-05-31 NOTE — Progress Notes (Signed)

## 2021-06-01 ENCOUNTER — Ambulatory Visit
Admission: RE | Admit: 2021-06-01 | Discharge: 2021-06-01 | Disposition: A | Discharge status: Home / Self Care | Payer: Medicare HMO | Source: Ambulatory Visit | Attending: Radiation Oncology | Admitting: Radiation Oncology

## 2021-06-01 DIAGNOSIS — D0511 Intraductal carcinoma in situ of right breast: Secondary | ICD-10-CM | POA: Diagnosis not present

## 2021-06-01 DIAGNOSIS — Z0000 Encounter for general adult medical examination without abnormal findings: Secondary | ICD-10-CM | POA: Diagnosis not present

## 2021-06-02 ENCOUNTER — Telehealth: Payer: Self-pay | Admitting: Hematology and Oncology

## 2021-06-02 ENCOUNTER — Other Ambulatory Visit: Payer: Self-pay

## 2021-06-02 ENCOUNTER — Ambulatory Visit
Admission: RE | Admit: 2021-06-02 | Discharge: 2021-06-02 | Disposition: A | Discharge status: Home / Self Care | Payer: Medicare HMO | Source: Ambulatory Visit | Attending: Radiation Oncology | Admitting: Radiation Oncology

## 2021-06-02 DIAGNOSIS — Z1211 Encounter for screening for malignant neoplasm of colon: Secondary | ICD-10-CM | POA: Diagnosis not present

## 2021-06-02 DIAGNOSIS — Z0000 Encounter for general adult medical examination without abnormal findings: Secondary | ICD-10-CM | POA: Diagnosis not present

## 2021-06-02 DIAGNOSIS — K219 Gastro-esophageal reflux disease without esophagitis: Secondary | ICD-10-CM | POA: Diagnosis not present

## 2021-06-02 DIAGNOSIS — D0511 Intraductal carcinoma in situ of right breast: Secondary | ICD-10-CM | POA: Diagnosis not present

## 2021-06-02 DIAGNOSIS — R109 Unspecified abdominal pain: Secondary | ICD-10-CM | POA: Diagnosis not present

## 2021-06-02 DIAGNOSIS — Z23 Encounter for immunization: Secondary | ICD-10-CM | POA: Diagnosis not present

## 2021-06-02 NOTE — Telephone Encounter (Signed)
Sch per 11/2 inbasket pt aware

## 2021-06-03 ENCOUNTER — Ambulatory Visit
Admission: RE | Admit: 2021-06-03 | Discharge: 2021-06-03 | Disposition: A | Discharge status: Home / Self Care | Payer: Medicare HMO | Source: Ambulatory Visit | Attending: Radiation Oncology | Admitting: Radiation Oncology

## 2021-06-03 DIAGNOSIS — K219 Gastro-esophageal reflux disease without esophagitis: Secondary | ICD-10-CM | POA: Diagnosis not present

## 2021-06-03 DIAGNOSIS — Z0000 Encounter for general adult medical examination without abnormal findings: Secondary | ICD-10-CM | POA: Diagnosis not present

## 2021-06-03 DIAGNOSIS — Z1211 Encounter for screening for malignant neoplasm of colon: Secondary | ICD-10-CM | POA: Diagnosis not present

## 2021-06-03 DIAGNOSIS — R109 Unspecified abdominal pain: Secondary | ICD-10-CM | POA: Diagnosis not present

## 2021-06-03 DIAGNOSIS — D0511 Intraductal carcinoma in situ of right breast: Secondary | ICD-10-CM | POA: Diagnosis not present

## 2021-06-03 DIAGNOSIS — Z23 Encounter for immunization: Secondary | ICD-10-CM | POA: Diagnosis not present

## 2021-06-04 ENCOUNTER — Other Ambulatory Visit: Payer: Self-pay

## 2021-06-04 ENCOUNTER — Ambulatory Visit
Admission: RE | Admit: 2021-06-04 | Discharge: 2021-06-04 | Disposition: A | Discharge status: Home / Self Care | Payer: Medicare HMO | Source: Ambulatory Visit | Attending: Radiation Oncology | Admitting: Radiation Oncology

## 2021-06-04 DIAGNOSIS — D0511 Intraductal carcinoma in situ of right breast: Secondary | ICD-10-CM | POA: Diagnosis not present

## 2021-06-04 NOTE — Progress Notes (Signed)
Patient brought around to nursing prior to radiation treatment with complaints of chest pain. Vitals taken. Patient reports she saw her PCP yesterday for her annual physical and "everything was fine"; she received 3 vaccines (flu, pneumonia, and COVID booster). States she woke up this morning ~4am with "elephants sitting on my chest". Reports the sternal discomfort comes and goes through out the day, but stays localized and doesn't radiate elsewhere. Advised patient I would update another provider with her symptoms since Dr. Isidore Moos was out of the office today. Door left open, call bell provided, and instructed patient to press call-bell should her symptoms worsen or new ones arise.   Updated Dr. Lisbeth Renshaw who recommended taking patient to ED out of abundance of caution. Called and spoke with ED charge nurse who stated that unfortunately there were no bays available for patient to come to directly, so she would need to proceed to the main lobby to check in and wait to be called. Updated patient who adamantly declined proceeding to ED. Advised patient that heart attack symptoms are different between men and woman, and that woman don't typically present with the "classic symptoms" publicized. For that reason I strongly encouraged that we proceed to the ED to be evaluated. Patient again declined, and stated if symptoms worsened she would call 911 or have a family member drive her to the ED so she wouldn't be waiting alone.   Patient escorted over to L4 dressing room for treatment and given staff-assist cord to pull should any of her symptoms change or worsen. Patient verbalized understanding and agreement, and again confirmed she would call 911 if she started feeling worse once she got home this evening  Vitals:   06/04/21 1517  BP: (!) 132/91  Pulse: 80  SpO2: 99%

## 2021-06-07 ENCOUNTER — Ambulatory Visit
Admission: RE | Admit: 2021-06-07 | Discharge: 2021-06-07 | Disposition: A | Discharge status: Home / Self Care | Payer: Medicare HMO | Source: Ambulatory Visit | Attending: Radiation Oncology | Admitting: Radiation Oncology

## 2021-06-07 ENCOUNTER — Other Ambulatory Visit: Payer: Self-pay

## 2021-06-07 DIAGNOSIS — D0511 Intraductal carcinoma in situ of right breast: Secondary | ICD-10-CM | POA: Diagnosis not present

## 2021-06-08 ENCOUNTER — Ambulatory Visit
Admission: RE | Admit: 2021-06-08 | Discharge: 2021-06-08 | Disposition: A | Discharge status: Home / Self Care | Payer: Medicare HMO | Source: Ambulatory Visit | Attending: Radiation Oncology | Admitting: Radiation Oncology

## 2021-06-08 DIAGNOSIS — D0511 Intraductal carcinoma in situ of right breast: Secondary | ICD-10-CM | POA: Diagnosis not present

## 2021-06-09 ENCOUNTER — Ambulatory Visit
Admission: RE | Admit: 2021-06-09 | Discharge: 2021-06-09 | Disposition: A | Discharge status: Home / Self Care | Payer: Medicare HMO | Source: Ambulatory Visit | Attending: Radiation Oncology | Admitting: Radiation Oncology

## 2021-06-09 ENCOUNTER — Other Ambulatory Visit: Payer: Self-pay

## 2021-06-09 DIAGNOSIS — D0511 Intraductal carcinoma in situ of right breast: Secondary | ICD-10-CM | POA: Diagnosis not present

## 2021-06-10 ENCOUNTER — Ambulatory Visit: Payer: Medicare HMO

## 2021-06-11 ENCOUNTER — Ambulatory Visit: Payer: Medicare HMO | Admitting: Psychologist

## 2021-06-11 ENCOUNTER — Ambulatory Visit: Payer: Medicare HMO

## 2021-06-13 ENCOUNTER — Ambulatory Visit
Admission: RE | Admit: 2021-06-13 | Discharge: 2021-06-13 | Disposition: A | Discharge status: Home / Self Care | Payer: Medicare HMO | Source: Ambulatory Visit | Attending: Radiation Oncology | Admitting: Radiation Oncology

## 2021-06-13 DIAGNOSIS — D0511 Intraductal carcinoma in situ of right breast: Secondary | ICD-10-CM | POA: Diagnosis not present

## 2021-06-13 NOTE — Progress Notes (Signed)
Patient Care Team: [name], MD as PCP - General (Family Medicine) Mauro Kaufmann, RN as Oncology Nurse Navigator Rockwell Germany, RN as Oncology Nurse Navigator Stark Klein, MD as Consulting Physician (General Surgery) Nicholas Lose, MD as Consulting Physician (Hematology and Oncology) Eppie Gibson, MD as Attending Physician (Radiation Oncology)  DIAGNOSIS:    ICD-10-CM   1. Ductal carcinoma in situ (DCIS) of right breast  D05.11       SUMMARY OF ONCOLOGIC HISTORY: Oncology History  Ductal carcinoma in situ (DCIS) of right breast  02/02/2021 Initial Diagnosis   Screening mammogram on 01/10/21 showed a possible mass and calcifications in the right breast. Diagnostic mammogram and [place] on 02/02/21 showed suspicious right breast calcifications and right breast cysts. Biopsy on 02/04/21 showed DCIS with necrosis and calcifications ER+ (95%)/ PR+ (30%).   02/18/2021 Genetic Testing   Negative genetic testing:  No pathogenic variants detected on the [employer] BRCAplus panel (report date 02/18/2021) or CancerNext-Expanded + RNAinsight panel (report date 02/20/2021).   The BRCAplus panel offered by [employer] and includes sequencing and deletion/duplication analysis for the following 8 genes: ATM, BRCA1, BRCA2, CDH1, CHEK2, PALB2, PTEN, and TP53. The CancerNext-Expanded + RNAinsight gene panel offered by [employer] and includes sequencing and rearrangement analysis for the following 77 genes: AIP, ALK, APC, ATM, AXIN2, BAP1, BARD1, BLM, BMPR1A, BRCA1, BRCA2, BRIP1, CDC73, CDH1, CDK4, CDKN1B, CDKN2A, CHEK2, CTNNA1, DICER1, FANCC, FH, FLCN, GALNT12, KIF1B, LZTR1, MAX, MEN1, MET, MLH1, MSH2, MSH3, MSH6, MUTYH, NBN, NF1, NF2, NTHL1, PALB2, PHOX2B, PMS2, POT1, PRKAR1A, PTCH1, PTEN, RAD51C, RAD51D, RB1, RECQL, RET, SDHA, SDHAF2, SDHB, SDHC, SDHD, SMAD4, SMARCA4, SMARCB1, SMARCE1, STK11, SUFU, TMEM127, TP53, TSC1, TSC2, VHL and XRCC2 (sequencing and deletion/duplication); EGFR, EGLN1,  HOXB13, KIT, MITF, PDGFRA, POLD1 and POLE (sequencing only); EPCAM and GREM1 (deletion/duplication only). RNA data is routinely analyzed for use in variant interpretation for all genes.     CHIEF COMPLIANT: Follow-up of right breast cancer  INTERVAL HISTORY: Carol [name] is a 65 y.o. with above-mentioned history of right breast DCIS having undergone lumpectomy. She presents to the clinic today for follow-up.   ALLERGIES:  is allergic to acetaminophen, celebrex [celecoxib], povidone iodine, tape, codeine, and doxycycline.  MEDICATIONS:  Current Outpatient Medications  Medication Sig Dispense Refill   ALPRAZolam (XANAX) 0.25 MG tablet Take 0.25 mg by mouth daily as needed for anxiety.     Ascorbic Acid (VITAMIN C) 1000 MG tablet Take 1,000 mg by mouth daily.     baclofen (LIORESAL) 10 MG tablet Take 10 mg by mouth 3 (three) times daily.     Biotin 10 MG CAPS Take 10 mg by mouth daily.     levothyroxine (SYNTHROID) 88 MCG tablet Take 88 mcg by mouth daily before breakfast.     ondansetron (ZOFRAN-ODT) 8 MG disintegrating tablet Take 8 mg by mouth every 8 (eight) hours as needed for vomiting or nausea.     oxybutynin (DITROPAN-XL) 10 MG 24 hr tablet Take 10 mg by mouth at bedtime.     oxyCODONE (OXY IR/ROXICODONE) 5 MG immediate release tablet Take 1 tablet (5 mg total) by mouth every 6 (six) hours as needed for severe pain. (Patient taking differently: Take 5 mg by mouth in the morning, at noon, and at bedtime.) 20 tablet 0   Semaglutide,0.25 or 0.5MG/DOS, (OZEMPIC, 0.25 OR 0.5 MG/DOSE,) 2 MG/1.5ML SOPN Inject 0.5 mg into the skin every Sunday.     traZODone (DESYREL) 50 MG tablet Take 50 mg by mouth  at bedtime as needed for sleep.     venlafaxine XR (EFFEXOR-XR) 75 MG 24 hr capsule Take 75 mg by mouth daily with breakfast.     No current facility-administered medications for this visit.    PHYSICAL EXAMINATION: ECOG PERFORMANCE STATUS: 1 - Symptomatic but completely ambulatory  Vitals:    06/14/21 1422  BP: (!) 173/82  Pulse: 87  Resp: 18  Temp: 97.9 F (36.6 C)  SpO2: 96%   Filed Weights   06/14/21 1422  Weight: 228 lb 14.4 oz (103.8 kg)    BREAST: No palpable masses or nodules in either right or left breasts. No palpable axillary supraclavicular or infraclavicular adenopathy no breast tenderness or nipple discharge. (exam performed in the presence of a chaperone)  LABORATORY DATA:  I have reviewed the data as listed CMP Latest Ref Rng & Units 04/21/2021 03/16/2021 02/10/2021  Glucose 70 - 99 mg/dL 137(H) 92 208(H)  BUN 8 - 23 mg/dL '21 18 14  ' Creatinine 0.44 - 1.00 mg/dL 0.90 0.83 0.79  Sodium 135 - 145 mmol/L 135 137 137  Potassium 3.5 - 5.1 mmol/L 3.5 4.0 4.5  Chloride 98 - 111 mmol/L 99 102 101  CO2 22 - 32 mmol/L '27 28 28  ' Calcium 8.9 - 10.3 mg/dL 9.1 8.9 9.2  Total Protein 6.5 - 8.1 g/dL - - 7.4  Total Bilirubin 0.3 - 1.2 mg/dL - - 0.6  Alkaline Phos 38 - 126 U/L - - 98  AST 15 - 41 U/L - - 25  ALT 0 - 44 U/L - - 31    Lab Results  Component Value Date   WBC 9.9 04/21/2021   HGB 14.5 04/21/2021   HCT 44.9 04/21/2021   MCV 92.0 04/21/2021   PLT 303 04/21/2021   NEUTROABS 4.4 02/10/2021    ASSESSMENT & PLAN:  Ductal carcinoma in situ (DCIS) of right breast 02/02/2021:Screening mammogram on 01/10/21 showed a possible mass and calcifications in the right breast. Diagnostic mammogram and [place] on 02/02/21 showed suspicious right breast calcifications and right breast cysts. Biopsy on 02/04/21 showed DCIS with necrosis and calcifications ER+ (95%)/ PR+ (30%).   Recommendation: 1. Breast conserving surgery: 03/25/2021: Right lumpectomy: Intermediate grade high-grade DCIS 0.7 cm, involves the posterior margin.  Right lateral lumpectomy: Benign, ER greater than 95%, PR 30% Patient will undergo additional surgery for clearance of the posterior margin. 2. Followed by adjuvant radiation therapy 05/27/2021-06/25/2021 3. Followed by antiestrogen therapy with  tamoxifen 5 years (on hold because of her multiple problems with bilateral knees as well as severe hot flashes) I believe her risk of complications from antiestrogen therapy are far higher than the benefits at this time ----------------------------------------------------------------------------------------------------------------------------------- Given the fact that she has extensive bilateral knee problems, I believe her risk of complications from antiestrogen therapy is very high.  She is also at high risk for blood clots because of multiple procedures being planned on her knees. Therefore we both decided that this is not the good time to start antiestrogens.  We may consider it in the next year at a later time.  Return to clinic in 3 months for survivorship care plan visit Mammograms to be done in June 2023   No orders of the defined types were placed in this encounter.  The patient has a good understanding of the overall plan. she agrees with it. she will call with any problems that may develop before the next visit here.  Total time spent: 20 mins including face to face time and  time spent for planning, charting and coordination of care  Rulon Eisenmenger, MD, MPH 06/14/2021  I, Thana Ates, am acting as scribe for Dr. Nicholas Lose.  I have reviewed the above documentation for accuracy and completeness, and I agree with the above.

## 2021-06-14 ENCOUNTER — Other Ambulatory Visit: Payer: Self-pay

## 2021-06-14 ENCOUNTER — Inpatient Hospital Stay: Payer: Medicare HMO | Attending: Hematology and Oncology | Admitting: Hematology and Oncology

## 2021-06-14 ENCOUNTER — Ambulatory Visit: Payer: Medicare HMO | Admitting: Radiation Oncology

## 2021-06-14 ENCOUNTER — Ambulatory Visit: Payer: Medicare HMO

## 2021-06-14 DIAGNOSIS — D0511 Intraductal carcinoma in situ of right breast: Secondary | ICD-10-CM

## 2021-06-14 DIAGNOSIS — Z170 Estrogen receptor positive status [ER+]: Secondary | ICD-10-CM | POA: Insufficient documentation

## 2021-06-14 NOTE — Assessment & Plan Note (Signed)
02/02/2021:Screening mammogram on 01/10/21 showed a possible mass and calcifications in the right breast. Diagnostic mammogram and [place] on 02/02/21 showed suspicious right breast calcifications and right breast cysts. Biopsy on 02/04/21 showed DCIS with necrosis and calcifications ER+ (95%)/ PR+ (30%).  Recommendation: 1. Breast conserving surgery: 03/25/2021: Right lumpectomy: Intermediate grade high-grade DCIS 0.7 cm, involves the posterior margin.  Right lateral lumpectomy: Benign, ER greater than 95%, PR 30% Patient will undergo additional surgery for clearance of the posterior margin. 2. Followed by adjuvant radiation therapy 05/27/2021-06/25/2021 3. Followed by antiestrogen therapy with tamoxifen 5 years ----------------------------------------------------------------------------------------------------------------------------------- We discussed the risks and benefits of tamoxifen. These include but not limited to insomnia, hot flashes, mood changes, vaginal dryness, and weight gain. Although rare, serious side effects including endometrial cancer, risk of blood clots were also discussed. We strongly believe that the benefits far outweigh the risks. Patient understands these risks and consented to starting treatment. Planned treatment duration is 5 years.  Return to clinic in 3 months for survivorship care plan visit

## 2021-06-15 ENCOUNTER — Ambulatory Visit: Payer: Medicare HMO

## 2021-06-16 ENCOUNTER — Ambulatory Visit: Payer: Medicare HMO

## 2021-06-21 ENCOUNTER — Other Ambulatory Visit: Payer: Self-pay

## 2021-06-21 ENCOUNTER — Ambulatory Visit
Admission: RE | Admit: 2021-06-21 | Discharge: 2021-06-21 | Disposition: A | Discharge status: Home / Self Care | Payer: Medicare HMO | Source: Ambulatory Visit | Attending: Radiation Oncology | Admitting: Radiation Oncology

## 2021-06-21 ENCOUNTER — Ambulatory Visit: Payer: Medicare HMO

## 2021-06-21 DIAGNOSIS — Z7689 Persons encountering health services in other specified circumstances: Secondary | ICD-10-CM

## 2021-06-21 DIAGNOSIS — D0511 Intraductal carcinoma in situ of right breast: Secondary | ICD-10-CM | POA: Diagnosis not present

## 2021-06-22 ENCOUNTER — Ambulatory Visit
Admission: RE | Admit: 2021-06-22 | Discharge: 2021-06-22 | Disposition: A | Discharge status: Home / Self Care | Payer: Medicare HMO | Source: Ambulatory Visit | Attending: Radiation Oncology | Admitting: Radiation Oncology

## 2021-06-22 ENCOUNTER — Ambulatory Visit: Payer: Medicare HMO

## 2021-06-22 ENCOUNTER — Telehealth: Payer: Self-pay | Admitting: *Deleted

## 2021-06-22 ENCOUNTER — Other Ambulatory Visit: Payer: Self-pay

## 2021-06-22 ENCOUNTER — Ambulatory Visit (HOSPITAL_COMMUNITY)
Admission: RE | Admit: 2021-06-22 | Discharge: 2021-06-22 | Disposition: A | Discharge status: Home / Self Care | Payer: Medicare HMO | Source: Ambulatory Visit | Attending: Radiation Oncology | Admitting: Radiation Oncology

## 2021-06-22 DIAGNOSIS — Z7689 Persons encountering health services in other specified circumstances: Secondary | ICD-10-CM | POA: Diagnosis not present

## 2021-06-22 DIAGNOSIS — D0511 Intraductal carcinoma in situ of right breast: Secondary | ICD-10-CM | POA: Diagnosis not present

## 2021-06-22 NOTE — Telephone Encounter (Signed)
CALLED PATIENT TO INFORM OF DOPPLER FOR 06-22-21- ARRIVAL TIME- 2:45 PM @ [facility], NO RESTRICTIONS TO TEST, SPOKE WITH PATIENT AND SHE IS AWARE OF THIS TEST

## 2021-06-23 ENCOUNTER — Ambulatory Visit: Payer: Medicare HMO

## 2021-06-23 ENCOUNTER — Other Ambulatory Visit: Payer: Self-pay

## 2021-06-23 ENCOUNTER — Ambulatory Visit
Admission: RE | Admit: 2021-06-23 | Discharge: 2021-06-23 | Disposition: A | Discharge status: Home / Self Care | Payer: Medicare HMO | Source: Ambulatory Visit | Attending: Radiation Oncology | Admitting: Radiation Oncology

## 2021-06-23 DIAGNOSIS — D0511 Intraductal carcinoma in situ of right breast: Secondary | ICD-10-CM | POA: Diagnosis not present

## 2021-06-23 DIAGNOSIS — M170 Bilateral primary osteoarthritis of knee: Secondary | ICD-10-CM | POA: Diagnosis not present

## 2021-06-23 DIAGNOSIS — R0781 Pleurodynia: Secondary | ICD-10-CM | POA: Diagnosis not present

## 2021-06-23 DIAGNOSIS — M25562 Pain in left knee: Secondary | ICD-10-CM | POA: Diagnosis not present

## 2021-06-24 ENCOUNTER — Encounter: Payer: Self-pay | Admitting: *Deleted

## 2021-06-24 ENCOUNTER — Ambulatory Visit: Payer: Medicare HMO

## 2021-06-24 ENCOUNTER — Ambulatory Visit
Admission: RE | Admit: 2021-06-24 | Discharge: 2021-06-24 | Disposition: A | Discharge status: Home / Self Care | Payer: Medicare HMO | Source: Ambulatory Visit | Attending: Radiation Oncology | Admitting: Radiation Oncology

## 2021-06-24 DIAGNOSIS — D0511 Intraductal carcinoma in situ of right breast: Secondary | ICD-10-CM | POA: Diagnosis not present

## 2021-06-24 DIAGNOSIS — F411 Generalized anxiety disorder: Secondary | ICD-10-CM | POA: Diagnosis not present

## 2021-06-25 ENCOUNTER — Other Ambulatory Visit: Payer: Self-pay

## 2021-06-25 ENCOUNTER — Ambulatory Visit
Admission: RE | Admit: 2021-06-25 | Discharge: 2021-06-25 | Disposition: A | Discharge status: Home / Self Care | Payer: Medicare HMO | Source: Ambulatory Visit | Attending: Radiation Oncology | Admitting: Radiation Oncology

## 2021-06-25 ENCOUNTER — Ambulatory Visit: Payer: Medicare HMO

## 2021-06-25 ENCOUNTER — Ambulatory Visit (INDEPENDENT_AMBULATORY_CARE_PROVIDER_SITE_OTHER): Payer: Medicare HMO | Admitting: Psychologist

## 2021-06-25 DIAGNOSIS — F411 Generalized anxiety disorder: Secondary | ICD-10-CM

## 2021-06-25 DIAGNOSIS — D0511 Intraductal carcinoma in situ of right breast: Secondary | ICD-10-CM | POA: Diagnosis not present

## 2021-06-28 ENCOUNTER — Other Ambulatory Visit: Payer: Self-pay

## 2021-06-28 ENCOUNTER — Ambulatory Visit
Admission: RE | Admit: 2021-06-28 | Discharge: 2021-06-28 | Disposition: A | Discharge status: Home / Self Care | Payer: Medicare HMO | Source: Ambulatory Visit | Attending: Radiation Oncology | Admitting: Radiation Oncology

## 2021-06-28 ENCOUNTER — Ambulatory Visit: Payer: Medicare HMO

## 2021-06-28 DIAGNOSIS — D0511 Intraductal carcinoma in situ of right breast: Secondary | ICD-10-CM

## 2021-06-28 MED ORDER — RADIAPLEXRX EX GEL: Freq: Once | CUTANEOUS | Status: AC

## 2021-06-29 ENCOUNTER — Ambulatory Visit
Admission: RE | Admit: 2021-06-29 | Discharge: 2021-06-29 | Disposition: A | Discharge status: Home / Self Care | Payer: Medicare HMO | Source: Ambulatory Visit | Attending: Radiation Oncology | Admitting: Radiation Oncology

## 2021-06-29 DIAGNOSIS — D0511 Intraductal carcinoma in situ of right breast: Secondary | ICD-10-CM | POA: Diagnosis not present

## 2021-06-30 ENCOUNTER — Encounter: Payer: Self-pay | Admitting: Radiation Oncology

## 2021-06-30 ENCOUNTER — Ambulatory Visit
Admission: RE | Admit: 2021-06-30 | Discharge: 2021-06-30 | Disposition: A | Discharge status: Home / Self Care | Payer: Medicare HMO | Source: Ambulatory Visit | Attending: Radiation Oncology | Admitting: Radiation Oncology

## 2021-06-30 ENCOUNTER — Other Ambulatory Visit: Payer: Self-pay

## 2021-06-30 ENCOUNTER — Ambulatory Visit: Payer: Medicare HMO

## 2021-06-30 DIAGNOSIS — D0511 Intraductal carcinoma in situ of right breast: Secondary | ICD-10-CM | POA: Diagnosis not present

## 2021-07-01 ENCOUNTER — Encounter: Payer: Self-pay | Admitting: *Deleted

## 2021-07-01 ENCOUNTER — Ambulatory Visit: Payer: Medicare HMO

## 2021-07-01 ENCOUNTER — Ambulatory Visit: Payer: Medicare HMO | Admitting: Psychologist

## 2021-07-01 DIAGNOSIS — D0511 Intraductal carcinoma in situ of right breast: Secondary | ICD-10-CM

## 2021-07-02 ENCOUNTER — Ambulatory Visit: Payer: Medicare HMO

## 2021-07-02 ENCOUNTER — Telehealth: Payer: Self-pay

## 2021-07-02 NOTE — Telephone Encounter (Signed)
07/01/21 documentation: Notified by therapists on L4 that patient called to cancel her final boost treatment. When therapists called patient back to inquire why, was informed that patient stated "she said it was an extra appointment added due to machine down.  She told clerical she was canceling today due to being sick.  When I called her back to ask her to come in early tomorrow she said shes going to be out of town.  I asked if she wanted to come monday for her last day and she said she wasnt wanting the last treatment.".   Dr. Isidore Moos made aware and advised therapists to EOT patient's treatment plan. Will follow-up with patient in 1 month as scheduled

## 2021-07-05 DIAGNOSIS — E039 Hypothyroidism, unspecified: Secondary | ICD-10-CM | POA: Diagnosis not present

## 2021-07-05 DIAGNOSIS — Z79899 Other long term (current) drug therapy: Secondary | ICD-10-CM | POA: Diagnosis not present

## 2021-07-05 DIAGNOSIS — F411 Generalized anxiety disorder: Secondary | ICD-10-CM | POA: Diagnosis not present

## 2021-07-05 DIAGNOSIS — R7303 Prediabetes: Secondary | ICD-10-CM | POA: Diagnosis not present

## 2021-07-05 DIAGNOSIS — E119 Type 2 diabetes mellitus without complications: Secondary | ICD-10-CM | POA: Diagnosis not present

## 2021-07-12 DIAGNOSIS — J069 Acute upper respiratory infection, unspecified: Secondary | ICD-10-CM | POA: Diagnosis not present

## 2021-07-13 DIAGNOSIS — J069 Acute upper respiratory infection, unspecified: Secondary | ICD-10-CM | POA: Diagnosis not present

## 2021-07-13 DIAGNOSIS — B9689 Other specified bacterial agents as the cause of diseases classified elsewhere: Secondary | ICD-10-CM | POA: Diagnosis not present

## 2021-07-13 DIAGNOSIS — J329 Chronic sinusitis, unspecified: Secondary | ICD-10-CM | POA: Diagnosis not present

## 2021-07-14 DIAGNOSIS — G894 Chronic pain syndrome: Secondary | ICD-10-CM | POA: Diagnosis not present

## 2021-07-14 DIAGNOSIS — M5417 Radiculopathy, lumbosacral region: Secondary | ICD-10-CM | POA: Diagnosis not present

## 2021-07-22 DIAGNOSIS — J189 Pneumonia, unspecified organism: Secondary | ICD-10-CM | POA: Diagnosis not present

## 2021-07-22 DIAGNOSIS — J069 Acute upper respiratory infection, unspecified: Secondary | ICD-10-CM | POA: Diagnosis not present

## 2021-08-04 ENCOUNTER — Other Ambulatory Visit: Payer: Self-pay

## 2021-08-04 ENCOUNTER — Ambulatory Visit
Admission: RE | Admit: 2021-08-04 | Discharge: 2021-08-04 | Disposition: A | Discharge status: Home / Self Care | Payer: Medicare HMO | Source: Ambulatory Visit | Attending: Radiation Oncology | Admitting: Radiation Oncology

## 2021-08-04 ENCOUNTER — Encounter: Payer: Self-pay | Admitting: Radiation Oncology

## 2021-08-04 VITALS — BP 132/68 | HR 52 | Temp 98.0°F | Resp 20 | Ht 66.5 in | Wt 226.4 lb

## 2021-08-04 DIAGNOSIS — R5383 Other fatigue: Secondary | ICD-10-CM | POA: Insufficient documentation

## 2021-08-04 DIAGNOSIS — D0511 Intraductal carcinoma in situ of right breast: Secondary | ICD-10-CM | POA: Diagnosis not present

## 2021-08-04 DIAGNOSIS — Z923 Personal history of irradiation: Secondary | ICD-10-CM | POA: Diagnosis not present

## 2021-08-04 DIAGNOSIS — Z79899 Other long term (current) drug therapy: Secondary | ICD-10-CM | POA: Diagnosis not present

## 2021-08-04 NOTE — Progress Notes (Signed)
Radiation Oncology         [phone] ________________________________  Name: Carol [name] MRN: [id number]  Date: 08/04/2021  DOB: November 05, 1955  Follow-Up Visit Note  Outpatient  CC: [name], MD  [name], MD  Diagnosis and Prior Radiotherapy:    ICD-10-CM   1. Ductal carcinoma in situ (DCIS) of right breast  D05.11       CHIEF COMPLAINT: Here for follow-up and surveillance of right breast DCIS  Narrative:  The patient returns today for routine follow-up.  Carol [name] presents today for follow-up after completing radiation to her right breast on 06/30/2021  Pain: Continues to experience shooting pains around lumpectomy site and in areola region. Reports they resolve quickly, but are uncomfortable  Skin: Reports skin to breast has healed and is intact, but there is an area that continues to peel around her nipple/areola area (denies any weeping or drainage) ROM: Denies any worsening concerns, other than right arm feels weaker Lymphedema: Patient denies MedOnc F/U: Scheduled for [facility] with Mendel Ryder Causey-NP on 09/14/2021 Other issues of note: States she noticed a small lump in her lumpectomy incision about 2 weeks ago. Denies any pain or redness to site, but does describe it as firm and fixed in place. Recently finished course of antibiotics for pneumonia. Reports fatigue has not improved and feels tired constantly.   Pt reports Yes No Comments  Tamoxifen []  [x]  Per patient: Dr. Lindi Adie decided not to prescribe an AI due to potential for future surgeys (total knee replacement, pinched nerve in back)  Letrozole []  [x]    Anastrazole []  [x]    Mammogram []  Date: TBD []                                   ALLERGIES:  is allergic to acetaminophen, celebrex [celecoxib], povidone iodine, tape, codeine, and doxycycline.  Meds: Current Outpatient Medications  Medication Sig Dispense Refill   ALPRAZolam (XANAX) 0.25 MG tablet Take 0.25 mg by mouth daily  as needed for anxiety.     Ascorbic Acid (VITAMIN C) 1000 MG tablet Take 1,000 mg by mouth daily.     baclofen (LIORESAL) 10 MG tablet Take 10 mg by mouth 3 (three) times daily.     Biotin 10 MG CAPS Take 10 mg by mouth daily.     levothyroxine (SYNTHROID) 88 MCG tablet Take 88 mcg by mouth daily before breakfast.     ondansetron (ZOFRAN-ODT) 8 MG disintegrating tablet Take 8 mg by mouth every 8 (eight) hours as needed for vomiting or nausea.     oxybutynin (DITROPAN-XL) 10 MG 24 hr tablet Take 10 mg by mouth at bedtime.     oxyCODONE (OXY IR/ROXICODONE) 5 MG immediate release tablet Take 1 tablet (5 mg total) by mouth every 6 (six) hours as needed for severe pain. (Patient taking differently: Take 5 mg by mouth in the morning, at noon, and at bedtime.) 20 tablet 0   Semaglutide,0.25 or 0.5MG /DOS, (OZEMPIC, 0.25 OR 0.5 MG/DOSE,) 2 MG/1.5ML SOPN Inject 0.5 mg into the skin every Sunday.     traZODone (DESYREL) 50 MG tablet Take 50 mg by mouth at bedtime as needed for sleep.     venlafaxine XR (EFFEXOR-XR) 75 MG 24 hr capsule Take 75 mg by mouth daily with breakfast.     No current facility-administered medications for this encounter.    Physical Findings: The patient is in no acute  distress. Patient is alert and oriented.  height is 5' 6.5" (1.689 m) and weight is 226 lb 6.4 oz (102.7 kg). Her temperature is 98 F (36.7 C). Her blood pressure is 132/68 and her pulse is 52 (abnormal). Her respiration is 20 and oxygen saturation is 100%. .    Satisfactory skin healing in radiotherapy fields. Palpable scar tissue around lumpectomy scar (right breast) in keeping with past treatment - not a clinical concern   Lab Findings: Lab Results  Component Value Date   WBC 9.9 04/21/2021   HGB 14.5 04/21/2021   HCT 44.9 04/21/2021   MCV 92.0 04/21/2021   PLT 303 04/21/2021    Radiographic Findings: No results found.  Impression/Plan: Healing well from radiotherapy to the breast tissue.  Her main  concern is family stress which we talked about in detail today. We will refer back to social work to find a more suitable counselor/ therapist for her (the first provider was not optimal).  I encouraged her to continue with yearly mammography as appropriate (for intact breast tissue) and followup with medical oncology. I will see her back on an as-needed basis. I have encouraged her to call if she has any issues or concerns in the future. I wished her the very best.  On date of service, in total, I spent 25 minutes on this encounter. Patient was seen in person.  _____________________________________   Eppie Gibson, MD

## 2021-08-04 NOTE — Progress Notes (Signed)
[name] presents today for follow-up after completing radiation to her right breast on 06/30/2021  Pain: Continues to experience shooting pains around lumpectomy site and in areola region. Reports they resolve quickly, but are uncomfortable  Skin: Reports skin to breast has healed and is intact, but there is an area that continues to peel around her nipple/areola area (denies any weeping or drainage) ROM: Denies any worsening concerns, other than right arm feels weaker Lymphedema: Patient denies MedOnc F/U: Scheduled for [facility] with Mendel Ryder Causey-NP on 09/14/2021 Other issues of note: States she noticed a small lump in her lumpectomy incision about 2 weeks ago. Denies any pain or redness to site, but does describe it as firm and fixed in place. Recently finished course of antibiotics for pneumonia. Reports fatigue has not improved and feels tired constantly.   Pt reports Yes No Comments  Tamoxifen []  [x]  Per patient: Dr. Lindi Adie decided not to prescribe an AI due to potential for future surgeys (total knee replacement, pinched nerve in back)  Letrozole []  [x]    Anastrazole []  [x]    Mammogram []  Date: TBD [] 

## 2021-08-05 ENCOUNTER — Encounter: Payer: Self-pay | Admitting: Licensed Clinical Social Worker

## 2021-08-05 NOTE — Progress Notes (Signed)
[facility] Work  Clinical Social Work was referred by Therapist, sports for assistance identifying a different therapist due to family stress.  Clinical Social Worker contacted patient by phone. Pt felt like other therapist was not a good fit. CSW discussed options and patient will contact [facility]) to set up an appt. CSW encouraged pt to let us know if that connection does not work for her.    [place], [place] Worker [employer]

## 2021-08-19 DIAGNOSIS — J309 Allergic rhinitis, unspecified: Secondary | ICD-10-CM | POA: Diagnosis not present

## 2021-08-19 DIAGNOSIS — J45909 Unspecified asthma, uncomplicated: Secondary | ICD-10-CM | POA: Diagnosis not present

## 2021-08-19 DIAGNOSIS — F331 Major depressive disorder, recurrent, moderate: Secondary | ICD-10-CM | POA: Diagnosis not present

## 2021-08-19 DIAGNOSIS — F411 Generalized anxiety disorder: Secondary | ICD-10-CM | POA: Diagnosis not present

## 2021-08-19 DIAGNOSIS — G4700 Insomnia, unspecified: Secondary | ICD-10-CM | POA: Diagnosis not present

## 2021-09-08 DIAGNOSIS — M5451 Vertebrogenic low back pain: Secondary | ICD-10-CM | POA: Diagnosis not present

## 2021-09-08 DIAGNOSIS — M7542 Impingement syndrome of left shoulder: Secondary | ICD-10-CM | POA: Diagnosis not present

## 2021-09-08 DIAGNOSIS — M1712 Unilateral primary osteoarthritis, left knee: Secondary | ICD-10-CM | POA: Diagnosis not present

## 2021-09-14 ENCOUNTER — Inpatient Hospital Stay: Payer: Medicare HMO | Attending: Hematology and Oncology | Admitting: Adult Health

## 2021-09-14 ENCOUNTER — Encounter: Payer: Medicare HMO | Admitting: Adult Health

## 2021-09-14 NOTE — Progress Notes (Incomplete)
SURVIVORSHIP VISIT:   BRIEF ONCOLOGIC HISTORY:  Oncology History  Ductal carcinoma in situ (DCIS) of right breast  02/02/2021 Initial Diagnosis   Screening mammogram on 01/10/21 showed a possible mass and calcifications in the right breast. Diagnostic mammogram and [place] on 02/02/21 showed suspicious right breast calcifications and right breast cysts. Biopsy on 02/04/21 showed DCIS with necrosis and calcifications ER+ (95%)/ PR+ (30%).   02/18/2021 Genetic Testing   Negative genetic testing:  No pathogenic variants detected on the [employer] BRCAplus panel (report date 02/18/2021) or CancerNext-Expanded + RNAinsight panel (report date 02/20/2021).   The BRCAplus panel offered by [employer] and includes sequencing and deletion/duplication analysis for the following 8 genes: ATM, BRCA1, BRCA2, CDH1, CHEK2, PALB2, PTEN, and TP53. The CancerNext-Expanded + RNAinsight gene panel offered by [employer] and includes sequencing and rearrangement analysis for the following 77 genes: AIP, ALK, APC, ATM, AXIN2, BAP1, BARD1, BLM, BMPR1A, BRCA1, BRCA2, BRIP1, CDC73, CDH1, CDK4, CDKN1B, CDKN2A, CHEK2, CTNNA1, DICER1, FANCC, FH, FLCN, GALNT12, KIF1B, LZTR1, MAX, MEN1, MET, MLH1, MSH2, MSH3, MSH6, MUTYH, NBN, NF1, NF2, NTHL1, PALB2, PHOX2B, PMS2, POT1, PRKAR1A, PTCH1, PTEN, RAD51C, RAD51D, RB1, RECQL, RET, SDHA, SDHAF2, SDHB, SDHC, SDHD, SMAD4, SMARCA4, SMARCB1, SMARCE1, STK11, SUFU, TMEM127, TP53, TSC1, TSC2, VHL and XRCC2 (sequencing and deletion/duplication); EGFR, EGLN1, HOXB13, KIT, MITF, PDGFRA, POLD1 and POLE (sequencing only); EPCAM and GREM1 (deletion/duplication only). RNA data is routinely analyzed for use in variant interpretation for all genes.   03/25/2021 Surgery   Breast conserving surgery: 03/25/2021: Right lumpectomy: Intermediate grade high-grade DCIS 0.7 cm, involves the posterior margin.  Right lateral lumpectomy: Benign, ER greater than 95%, PR 30% Patient will undergo additional surgery for clearance  of the posterior margin.    05/27/2021 - 06/25/2021 Radiation Therapy    Followed by adjuvant radiation therapy 05/27/2021-06/25/2021   09/13/2021 Cancer Staging   Staging form: Breast, AJCC 8th Edition - Pathologic: Stage 0 (pTis (DCIS), pN0, cM0, ER+, PR+) - Signed by Gardenia Phlegm, NP on 09/13/2021 Stage prefix: Initial diagnosis      INTERVAL HISTORY:  Ms. Patron to review her survivorship care plan detailing her treatment course for breast cancer, as well as monitoring long-term side effects of that treatment, education regarding health maintenance, screening, and overall wellness and health promotion.     Overall, Ms. Bumgardner reports feeling quite well   REVIEW OF SYSTEMS:  Review of Systems  Constitutional:  Negative for appetite change, chills, fatigue, fever and unexpected weight change.  HENT:   Negative for hearing loss, lump/mass and trouble swallowing.   Eyes:  Negative for eye problems and icterus.  Respiratory:  Negative for chest tightness, cough and shortness of breath.   Cardiovascular:  Negative for chest pain, leg swelling and palpitations.  Gastrointestinal:  Negative for abdominal distention, abdominal pain, constipation, diarrhea, nausea and vomiting.  Endocrine: Negative for hot flashes.  Genitourinary:  Negative for difficulty urinating.   Musculoskeletal:  Negative for arthralgias.  Skin:  Negative for itching and rash.  Neurological:  Negative for dizziness, extremity weakness, headaches and numbness.  Hematological:  Negative for adenopathy. Does not bruise/bleed easily.  Psychiatric/Behavioral:  Negative for depression. The patient is not nervous/anxious.   Breast: Denies any new nodularity, masses, tenderness, nipple changes, or nipple discharge.      ONCOLOGY TREATMENT TEAM:  1. Surgeon:  Dr. Barry Dienes at [facility] 2. Medical Oncologist: Dr. Lindi Adie  3. Radiation Oncologist: Dr. Isidore Moos    PAST MEDICAL/SURGICAL HISTORY:  Past  Medical History:  Diagnosis  Date   Anxiety    Arthritis    Balance problems    Chronic anxiety    Chronic depression    Chronic diarrhea    after gallbladder surgery   Chronic low back pain    Chronic seasonal allergic rhinitis    Complication of anesthesia    pt states has difficulty with being put to sleep    Exogenous obesity    Falls    Family history of adverse reaction to anesthesia    pts daughter has N&V   Family history of breast cancer    Family history of prostate cancer    Fatigue    Hemorrhoids    History of blood clots    History of blood transfusion    2006   History of bronchitis    History of chicken pox    History of frequent urinary tract infections    History of gallstones    History of jaundice    History of kidney stones    History of measles as a child    History of mumps as a child    Hypothyroidism    Insomnia    Night sweats    Pneumonia    hx of    Polycythemia    PONV (postoperative nausea and vomiting)    Poor sleep    chronic poor quality sleep wit snoring, sleep study in Nov 2013 showed mild central sleep apnea with hypoxia felt due to narcotics, no significant OSA or RLS   Thyroid disease    Tinnitus    Type II diabetes mellitus ([place]) 11/2016   Past Surgical History:  Procedure Laterality Date   ABDOMINAL HYSTERECTOMY     APPENDECTOMY  1972   BACK SURGERY     BREAST BIOPSY Right    years ago- unsure when   BREAST LUMPECTOMY WITH RADIOACTIVE SEED LOCALIZATION Right 03/25/2021   Procedure: RIGHT BREAST LUMPECTOMY WITH RADIOACTIVE SEED LOCALIZATION X2;  Surgeon: Stark Klein, MD;  Location: [facility];  Service: General;  Laterality: Right;   CHOLECYSTECTOMY  1987   COLONOSCOPY  11/2011   showed mild diverticulosis, internal hemorrhoids, and no polyps   ENDOSCOPY with esophageal dilatation  03/14/2016   KNEE ARTHROSCOPY Right 01/07/2016   Procedure: ARTHROSCOPY RIGHT KNEE, DEBRIDEMENT OF  ARTHROFIBROSIS, AND EXAM UNDER ANESTHESIA;  Surgeon: Susa Day, MD;  Location: [facility];  Service: Orthopedics;  Laterality: Right;   KNEE ARTHROSCOPY Right 10/04/2017   Procedure: Right knee arthroscopy, evaluation under anesthesia, lysis of adhesions;  Surgeon: Susa Day, MD;  Location: [facility];  Service: Orthopedics;  Laterality: Right;  60 mins   KNEE SURGERY     L3/4  fusion  2004   L3/4 discectomy  2003   L4/5 discectomy  2001   L4/5 fusion  2002   left foot surgery      to repair break - 2002   left knee meniscectomy  2012   lumbar spine ESI  12/2017   menicus tear     bilat;    RE-EXCISION OF BREAST LUMPECTOMY Right 04/29/2021   Procedure: RE-EXCISION OF RIGHT BREAST LUMPECTOMY;  Surgeon: Stark Klein, MD;  Location: [facility];  Service: General;  Laterality: Right;   right knee arthroscopy  12/2015   Dr. Tonita Cong   right knee meniscectomy  2012   TONSILLECTOMY     TOTAL KNEE ARTHROPLASTY Right 03/27/2015   Procedure: RIGHT TOTAL KNEE ARTHROPLASTY;  Surgeon: Susa Day, MD;  Location: [facility];  Service: Orthopedics;  Laterality: Right;   total knee replacement Right 03/2015   VAGINAL HYSTERECTOMY  1996   with ovaries intact, accidental bladder laceration   WRIST SURGERY  11/2016   Dr. Amedeo Plenty     ALLERGIES:  Allergies  Allergen Reactions   Acetaminophen Nausea And Vomiting   Celebrex [Celecoxib] Itching   Povidone Iodine Hives    Topical    Tape     Blisters    Codeine Rash   Doxycycline Rash     CURRENT MEDICATIONS:  Outpatient Encounter Medications as of 09/14/2021  Medication Sig   ALPRAZolam (XANAX) 0.25 MG tablet Take 0.25 mg by mouth daily as needed for anxiety.   Ascorbic Acid (VITAMIN C) 1000 MG tablet Take 1,000 mg by mouth daily.   baclofen (LIORESAL) 10 MG tablet Take 10 mg by mouth 3 (three) times daily.   Biotin 10 MG CAPS Take 10 mg by mouth daily.   levothyroxine (SYNTHROID) 88 MCG tablet Take 88 mcg by mouth daily  before breakfast.   ondansetron (ZOFRAN-ODT) 8 MG disintegrating tablet Take 8 mg by mouth every 8 (eight) hours as needed for vomiting or nausea.   oxybutynin (DITROPAN-XL) 10 MG 24 hr tablet Take 10 mg by mouth at bedtime.   oxyCODONE (OXY IR/ROXICODONE) 5 MG immediate release tablet Take 1 tablet (5 mg total) by mouth every 6 (six) hours as needed for severe pain. (Patient taking differently: Take 5 mg by mouth in the morning, at noon, and at bedtime.)   Semaglutide,0.25 or 0.5MG/DOS, (OZEMPIC, 0.25 OR 0.5 MG/DOSE,) 2 MG/1.5ML SOPN Inject 0.5 mg into the skin every Sunday.   traZODone (DESYREL) 50 MG tablet Take 50 mg by mouth at bedtime as needed for sleep.   venlafaxine XR (EFFEXOR-XR) 75 MG 24 hr capsule Take 75 mg by mouth daily with breakfast.   No facility-administered encounter medications on file as of 09/14/2021.     ONCOLOGIC FAMILY HISTORY:  Family History  Problem Relation Age of Onset   Hypertension Mother    COPD Mother    Diabetes Mother    Heart attack Father 22   Prostate cancer Maternal Uncle 27   Myasthenia gravis Daughter    Depression Daughter    Other Son        esophageal dysmotility, [employer]   Diabetes type II Other    Other Other        premature cardiovascular disease   Breast cancer Other 53       bilateral, mother's first cousin   Prostate cancer Other        dx 78s, maternal cousin's son     GENETIC COUNSELING/TESTING: Not at this time  SOCIAL HISTORY:  Social History   Socioeconomic History   Marital status: Married    Spouse name: Not on file   Number of children: Not on file   Years of education: Not on file   Highest education level: Not on file  Occupational History   Not on file  Tobacco Use   Smoking status: Never   Smokeless tobacco: Never  Vaping Use   Vaping Use: Never used  Substance and Sexual Activity   Alcohol use: No   Drug use: No   Sexual activity: Yes    Birth  control/protection: None, Surgical  Other Topics Concern   Not on file  Social History Narrative   Not on file   Social Determinants of Health   Financial Resource Strain: Low Risk    Difficulty of Paying Living  Expenses: Not very hard  Food Insecurity: No Food Insecurity   Worried About Charity fundraiser in the Last Year: Never true   Ran Out of Food in the Last Year: Never true  Transportation Needs: No Transportation Needs   Lack of Transportation (Medical): No   Lack of Transportation (Non-Medical): No  Physical Activity: Not on file  Stress: Stress Concern Present   Feeling of Stress : Very much  Social Connections: Moderately Integrated   Frequency of Communication with Friends and Family: More than three times a week   Frequency of Social Gatherings with Friends and Family: More than three times a week   Attends Religious Services: 1 to 4 times per year   Active Member of [employer] or Organizations: No   Attends Archivist Meetings: Never   Marital Status: Married  Human resources officer Violence: Not on file     OBSERVATIONS/OBJECTIVE:  There were no vitals taken for this visit. GENERAL: Patient is a well appearing female in no acute distress HEENT:  Sclerae anicteric.  Oropharynx clear and moist. No ulcerations or evidence of oropharyngeal candidiasis. Neck is supple.  NODES:  No cervical, supraclavicular, or axillary lymphadenopathy palpated.  BREAST EXAM: Right breast status postlumpectomy and radiation no sign of local recurrence left breast is benign LUNGS:  Clear to auscultation bilaterally.  No wheezes or rhonchi. HEART:  Regular rate and rhythm. No murmur appreciated. ABDOMEN:  Soft, nontender.  Positive, normoactive bowel sounds. No organomegaly palpated. MSK:  No focal spinal tenderness to palpation. Full range of motion bilaterally in the upper extremities. EXTREMITIES:  No peripheral edema.   SKIN:  Clear with no obvious rashes or skin  changes. No nail dyscrasia. NEURO:  Nonfocal. Well oriented.  Appropriate affect.  LABORATORY DATA:  None for this visit.  DIAGNOSTIC IMAGING:  None for this visit.      ASSESSMENT AND PLAN:  Ms.. Tobler is a pleasant 66 y.o. female with Stage 0 right breast DCIS, ER+/PR+, diagnosed in 01/2021, treated with lumpectomy & adjuvant radiation therapy.  She presents to the Survivorship Clinic for our initial meeting and routine follow-up post-completion of treatment for breast cancer.    1. Stage 0 right breast cancer:  Ms. Mikkelsen is continuing to recover from definitive treatment for breast cancer. She will follow-up with her medical Her mammogram is due 12/2021; orders placed today. Today, a comprehensive survivorship care plan and treatment summary was reviewed with the patient today detailing her breast cancer diagnosis, treatment course, potential late/long-term effects of treatment, appropriate follow-up care with recommendations for the future, and patient education resources.  A copy of this summary, along with a letter will be sent to the patients primary care provider via mail/fax/In Basket message after todays visit.    #. Problem(s) at Visit______________  #. Bone health:   She was given education on specific activities to promote bone health.  #. Cancer screening:  Due to Ms. Kopka's history and her age, she should receive screening for skin cancers, colon cancer, and gynecologic cancers.  The information and recommendations are listed on the patient's comprehensive care plan/treatment summary and were reviewed in detail with the patient.    #. Health maintenance and wellness promotion: Ms. Wyeth was encouraged to consume 5-7 servings of fruits and vegetables per day. We reviewed the "Nutrition Rainbow" handout.  She was also encouraged to engage in moderate to vigorous exercise for 30 minutes per day most days of the week. We discussed the [employer] fitness  program, which is  designed for cancer survivors to help them become more physically fit after cancer treatments.  She was instructed to limit her alcohol consumption and continue to abstain from tobacco use.     #. Support services/counseling: It is not uncommon for this period of the patient's cancer care trajectory to be one of many emotions and stressors.  She was given information regarding our available services and encouraged to contact me with any questions or for help enrolling in any of our support group/programs.    Follow up instructions:    -Return to cancer center ***  -Mammogram due in *** -Follow up with surgery *** -She is welcome to return back to the Survivorship Clinic at any time; no additional follow-up needed at this time.  -Consider referral back to survivorship as a long-term survivor for continued surveillance  The patient was provided an opportunity to ask questions and all were answered. The patient agreed with the plan and demonstrated an understanding of the instructions.   Total encounter time: *** minutes  Wilber Bihari, NP 09/14/21 9:05 AM Medical Oncology and Hematology [facility] [place],  [address] Tel. [phone]    Fax. [phone]  *Total Encounter Time as defined by the Centers for Medicare and Medicaid Services includes, in addition to the face-to-face time of a patient visit (documented in the note above) non-face-to-face time: obtaining and reviewing outside history, ordering and reviewing medications, tests or procedures, care coordination (communications with other health care professionals or caregivers) and documentation in the medical record.

## 2021-09-17 NOTE — Progress Notes (Signed)
Radiation Oncology         [phone] ________________________________  Name: Carol [name] MRN: [id number]  Date: 06/30/2021  DOB: 04-21-1956  End of Treatment Note  Diagnosis:    Cancer Staging  Ductal carcinoma in situ (DCIS) of right breast Staging form: Breast, AJCC 8th Edition - Clinical stage from 02/10/2021: G3, ER+, PR+, HER2: Not Assessed - Unsigned Stage prefix: Initial diagnosis Histologic grading system: 3 grade system - Pathologic: Stage 0 (pTis (DCIS), pN0, cM0, ER+, PR+) - Signed by Gardenia Phlegm, NP on 09/13/2021 Stage prefix: Initial diagnosis   Indication for treatment:  curative       Radiation treatment dates:   05/26/21 to 06/30/21  Site/dose:   1) Right Breast 3D conformal // 42.72 Gy in 16 fractions  2) Right breast lumpectomy boost // 8Gy in 4 fractions  Narrative: The patient tolerated radiation treatment relatively well.    Plan: The patient has completed radiation treatment. The patient will return to radiation oncology clinic for routine followup in one month. I advised them to call or return sooner if they have any questions or concerns related to their recovery or treatment.  -----------------------------------  Eppie Gibson, MD

## 2021-09-22 DIAGNOSIS — M1712 Unilateral primary osteoarthritis, left knee: Secondary | ICD-10-CM | POA: Diagnosis not present

## 2021-09-22 DIAGNOSIS — M25562 Pain in left knee: Secondary | ICD-10-CM | POA: Diagnosis not present

## 2021-09-25 DIAGNOSIS — M5416 Radiculopathy, lumbar region: Secondary | ICD-10-CM | POA: Diagnosis not present

## 2021-09-25 DIAGNOSIS — M5136 Other intervertebral disc degeneration, lumbar region: Secondary | ICD-10-CM | POA: Diagnosis not present

## 2021-09-29 DIAGNOSIS — M1712 Unilateral primary osteoarthritis, left knee: Secondary | ICD-10-CM | POA: Diagnosis not present

## 2021-09-29 DIAGNOSIS — M25562 Pain in left knee: Secondary | ICD-10-CM | POA: Diagnosis not present

## 2021-10-05 DIAGNOSIS — F411 Generalized anxiety disorder: Secondary | ICD-10-CM | POA: Diagnosis not present

## 2021-10-05 DIAGNOSIS — Z79891 Long term (current) use of opiate analgesic: Secondary | ICD-10-CM | POA: Diagnosis not present

## 2021-10-05 DIAGNOSIS — E039 Hypothyroidism, unspecified: Secondary | ICD-10-CM | POA: Diagnosis not present

## 2021-10-05 DIAGNOSIS — J329 Chronic sinusitis, unspecified: Secondary | ICD-10-CM | POA: Diagnosis not present

## 2021-10-05 DIAGNOSIS — B9689 Other specified bacterial agents as the cause of diseases classified elsewhere: Secondary | ICD-10-CM | POA: Diagnosis not present

## 2021-10-05 DIAGNOSIS — J069 Acute upper respiratory infection, unspecified: Secondary | ICD-10-CM | POA: Diagnosis not present

## 2021-10-26 DIAGNOSIS — M25562 Pain in left knee: Secondary | ICD-10-CM | POA: Diagnosis not present

## 2021-10-27 DIAGNOSIS — E039 Hypothyroidism, unspecified: Secondary | ICD-10-CM | POA: Diagnosis not present

## 2021-10-27 DIAGNOSIS — E119 Type 2 diabetes mellitus without complications: Secondary | ICD-10-CM | POA: Diagnosis not present

## 2021-10-27 DIAGNOSIS — E559 Vitamin D deficiency, unspecified: Secondary | ICD-10-CM | POA: Diagnosis not present

## 2021-10-28 ENCOUNTER — Telehealth: Payer: Self-pay | Admitting: Emergency Medicine

## 2021-10-29 ENCOUNTER — Telehealth: Payer: Self-pay

## 2021-10-29 DIAGNOSIS — G8929 Other chronic pain: Secondary | ICD-10-CM | POA: Diagnosis not present

## 2021-10-29 DIAGNOSIS — E039 Hypothyroidism, unspecified: Secondary | ICD-10-CM | POA: Diagnosis not present

## 2021-10-29 DIAGNOSIS — H1010 Acute atopic conjunctivitis, unspecified eye: Secondary | ICD-10-CM | POA: Diagnosis not present

## 2021-10-29 DIAGNOSIS — M25561 Pain in right knee: Secondary | ICD-10-CM | POA: Diagnosis not present

## 2021-10-29 DIAGNOSIS — E559 Vitamin D deficiency, unspecified: Secondary | ICD-10-CM | POA: Diagnosis not present

## 2021-10-29 DIAGNOSIS — M25562 Pain in left knee: Secondary | ICD-10-CM | POA: Diagnosis not present

## 2021-10-29 DIAGNOSIS — E1165 Type 2 diabetes mellitus with hyperglycemia: Secondary | ICD-10-CM | POA: Diagnosis not present

## 2021-10-29 DIAGNOSIS — H109 Unspecified conjunctivitis: Secondary | ICD-10-CM | POA: Diagnosis not present

## 2021-10-29 NOTE — Telephone Encounter (Signed)
Called and spoke with patient to get more information about her concerns. Patient reported she has been having increased pain to her right breast. She reports a tender knot the size of a baseball has developed, and the breast appears more swollen than her left. She states the pain medication and muscle relaxer she is on for her chronic back pain seems to have no effect on the breast pain. Reports she's called her surgeon's office (Dr. Barry Dienes) with an update, and the soonest opening she had available was 11/30/21 (patient is on a cancellation list should a sooner appointment become available). Patient also mentioned she was concerned this may be related to her painful scarring condition. She states the condition causes her to have to have her total knee operated on every 2 years to remove scar tissue build-up. Offered her a F/U with Dr. Isidore Moos for 11/03/21 at 11:00 which patient accepted. Informed her I would pass along her symptoms/concerns to Dr. Barry Dienes and her [place] team to see if anyone has other suggestions or recommendations. Patient verbalized agreement and appreciation of call.  ?

## 2021-11-02 ENCOUNTER — Telehealth: Payer: Self-pay

## 2021-11-02 DIAGNOSIS — C50411 Malignant neoplasm of upper-outer quadrant of right female breast: Secondary | ICD-10-CM | POA: Diagnosis not present

## 2021-11-02 DIAGNOSIS — Z170 Estrogen receptor positive status [ER+]: Secondary | ICD-10-CM | POA: Diagnosis not present

## 2021-11-02 NOTE — Telephone Encounter (Signed)
Called and spoke with patient who confirmed that she was seen by PA at breast surgeon's office, and will have an [place] of her breast to investigate current symptoms. She stated she no longer felt she needed to see Dr. Isidore Moos tomorrow, and would like appointment to be canceled. No other needs identified at this time, but patient knows she can contact me directly if needed.  ?

## 2021-11-03 ENCOUNTER — Ambulatory Visit: Payer: Medicare HMO | Admitting: Radiation Oncology

## 2021-11-10 ENCOUNTER — Encounter (HOSPITAL_COMMUNITY): Payer: Self-pay

## 2021-11-26 ENCOUNTER — Other Ambulatory Visit: Payer: Self-pay | Admitting: Hematology and Oncology

## 2021-11-26 ENCOUNTER — Other Ambulatory Visit: Payer: Self-pay | Admitting: Student

## 2021-11-26 DIAGNOSIS — D0511 Intraductal carcinoma in situ of right breast: Secondary | ICD-10-CM

## 2021-11-30 ENCOUNTER — Other Ambulatory Visit: Payer: Medicare HMO

## 2021-11-30 DIAGNOSIS — B9689 Other specified bacterial agents as the cause of diseases classified elsewhere: Secondary | ICD-10-CM | POA: Diagnosis not present

## 2021-11-30 DIAGNOSIS — J329 Chronic sinusitis, unspecified: Secondary | ICD-10-CM | POA: Diagnosis not present

## 2021-11-30 DIAGNOSIS — F411 Generalized anxiety disorder: Secondary | ICD-10-CM | POA: Diagnosis not present

## 2021-11-30 DIAGNOSIS — J069 Acute upper respiratory infection, unspecified: Secondary | ICD-10-CM | POA: Diagnosis not present

## 2021-11-30 DIAGNOSIS — J309 Allergic rhinitis, unspecified: Secondary | ICD-10-CM | POA: Diagnosis not present

## 2021-12-07 ENCOUNTER — Ambulatory Visit: Payer: Medicare HMO

## 2021-12-07 ENCOUNTER — Other Ambulatory Visit: Payer: Self-pay | Admitting: Student

## 2021-12-07 ENCOUNTER — Ambulatory Visit
Admission: RE | Admit: 2021-12-07 | Discharge: 2021-12-07 | Disposition: A | Discharge status: Home / Self Care | Payer: Medicare HMO | Source: Ambulatory Visit | Attending: Student | Admitting: Student

## 2021-12-07 DIAGNOSIS — N644 Mastodynia: Secondary | ICD-10-CM | POA: Diagnosis not present

## 2021-12-07 DIAGNOSIS — D0511 Intraductal carcinoma in situ of right breast: Secondary | ICD-10-CM

## 2021-12-07 HISTORY — DX: Personal history of irradiation: Z92.3

## 2021-12-12 ENCOUNTER — Observation Stay (HOSPITAL_COMMUNITY)
Admission: EM | Admit: 2021-12-12 | Discharge: 2021-12-13 | Disposition: A | Discharge status: Home / Self Care | Payer: Medicare HMO | Attending: Internal Medicine | Admitting: Internal Medicine

## 2021-12-12 ENCOUNTER — Emergency Department (HOSPITAL_COMMUNITY): Payer: Medicare HMO

## 2021-12-12 ENCOUNTER — Encounter (HOSPITAL_COMMUNITY): Payer: Self-pay

## 2021-12-12 ENCOUNTER — Other Ambulatory Visit: Payer: Self-pay

## 2021-12-12 DIAGNOSIS — M545 Low back pain, unspecified: Secondary | ICD-10-CM

## 2021-12-12 DIAGNOSIS — D0511 Intraductal carcinoma in situ of right breast: Secondary | ICD-10-CM | POA: Diagnosis present

## 2021-12-12 DIAGNOSIS — Z853 Personal history of malignant neoplasm of breast: Secondary | ICD-10-CM | POA: Insufficient documentation

## 2021-12-12 DIAGNOSIS — R457 State of emotional shock and stress, unspecified: Secondary | ICD-10-CM | POA: Diagnosis not present

## 2021-12-12 DIAGNOSIS — G8929 Other chronic pain: Secondary | ICD-10-CM

## 2021-12-12 DIAGNOSIS — I10 Essential (primary) hypertension: Secondary | ICD-10-CM | POA: Diagnosis not present

## 2021-12-12 DIAGNOSIS — Z96651 Presence of right artificial knee joint: Secondary | ICD-10-CM | POA: Insufficient documentation

## 2021-12-12 DIAGNOSIS — F332 Major depressive disorder, recurrent severe without psychotic features: Secondary | ICD-10-CM | POA: Diagnosis not present

## 2021-12-12 DIAGNOSIS — F419 Anxiety disorder, unspecified: Secondary | ICD-10-CM

## 2021-12-12 DIAGNOSIS — H538 Other visual disturbances: Secondary | ICD-10-CM | POA: Diagnosis not present

## 2021-12-12 DIAGNOSIS — E039 Hypothyroidism, unspecified: Secondary | ICD-10-CM | POA: Diagnosis not present

## 2021-12-12 DIAGNOSIS — R29818 Other symptoms and signs involving the nervous system: Secondary | ICD-10-CM | POA: Diagnosis not present

## 2021-12-12 DIAGNOSIS — E119 Type 2 diabetes mellitus without complications: Secondary | ICD-10-CM | POA: Insufficient documentation

## 2021-12-12 DIAGNOSIS — K5730 Diverticulosis of large intestine without perforation or abscess without bleeding: Secondary | ICD-10-CM | POA: Diagnosis not present

## 2021-12-12 DIAGNOSIS — R42 Dizziness and giddiness: Secondary | ICD-10-CM | POA: Diagnosis not present

## 2021-12-12 DIAGNOSIS — G459 Transient cerebral ischemic attack, unspecified: Principal | ICD-10-CM | POA: Diagnosis present

## 2021-12-12 DIAGNOSIS — Z79899 Other long term (current) drug therapy: Secondary | ICD-10-CM | POA: Diagnosis not present

## 2021-12-12 DIAGNOSIS — R0602 Shortness of breath: Secondary | ICD-10-CM | POA: Diagnosis not present

## 2021-12-12 DIAGNOSIS — R531 Weakness: Secondary | ICD-10-CM | POA: Diagnosis not present

## 2021-12-12 DIAGNOSIS — R202 Paresthesia of skin: Secondary | ICD-10-CM | POA: Diagnosis present

## 2021-12-12 LAB — DIFFERENTIAL
Abs Immature Granulocytes: 0.05 10*3/uL (ref 0.00–0.07)
Basophils Absolute: 0 10*3/uL (ref 0.0–0.1)
Basophils Relative: 1 %
Eosinophils Absolute: 0.2 10*3/uL (ref 0.0–0.5)
Eosinophils Relative: 3 %
Immature Granulocytes: 1 %
Lymphocytes Relative: 25 %
Lymphs Abs: 1.8 10*3/uL (ref 0.7–4.0)
Monocytes Absolute: 0.6 10*3/uL (ref 0.1–1.0)
Monocytes Relative: 8 %
Neutro Abs: 4.6 10*3/uL (ref 1.7–7.7)
Neutrophils Relative %: 62 %

## 2021-12-12 LAB — CBC
HCT: 42.5 % (ref 36.0–46.0)
Hemoglobin: 13.6 g/dL (ref 12.0–15.0)
MCH: 29.3 pg (ref 26.0–34.0)
MCHC: 32 g/dL (ref 30.0–36.0)
MCV: 91.6 fL (ref 80.0–100.0)
Platelets: 227 10*3/uL (ref 150–400)
RBC: 4.64 MIL/uL (ref 3.87–5.11)
RDW: 13.7 % (ref 11.5–15.5)
WBC: 7.3 10*3/uL (ref 4.0–10.5)
nRBC: 0 % (ref 0.0–0.2)

## 2021-12-12 LAB — I-STAT CHEM 8, ED
BUN: 11 mg/dL (ref 8–23)
Calcium, Ion: 1.06 mmol/L — ABNORMAL LOW (ref 1.15–1.40)
Chloride: 104 mmol/L (ref 98–111)
Creatinine, Ser: 0.7 mg/dL (ref 0.44–1.00)
Glucose, Bld: 103 mg/dL — ABNORMAL HIGH (ref 70–99)
HCT: 42 % (ref 36.0–46.0)
Hemoglobin: 14.3 g/dL (ref 12.0–15.0)
Potassium: 3.8 mmol/L (ref 3.5–5.1)
Sodium: 141 mmol/L (ref 135–145)
TCO2: 27 mmol/L (ref 22–32)

## 2021-12-12 LAB — COMPREHENSIVE METABOLIC PANEL
ALT: 13 U/L (ref 0–44)
AST: 19 U/L (ref 15–41)
Albumin: 3.2 g/dL — ABNORMAL LOW (ref 3.5–5.0)
Alkaline Phosphatase: 86 U/L (ref 38–126)
Anion gap: 9 (ref 5–15)
BUN: 10 mg/dL (ref 8–23)
CO2: 24 mmol/L (ref 22–32)
Calcium: 8.7 mg/dL — ABNORMAL LOW (ref 8.9–10.3)
Chloride: 107 mmol/L (ref 98–111)
Creatinine, Ser: 0.77 mg/dL (ref 0.44–1.00)
GFR, Estimated: >60 mL/min (ref >60)
Glucose, Bld: 105 mg/dL — ABNORMAL HIGH (ref 70–99)
Potassium: 3.9 mmol/L (ref 3.5–5.1)
Sodium: 140 mmol/L (ref 135–145)
Total Bilirubin: 0.4 mg/dL (ref 0.3–1.2)
Total Protein: 6.5 g/dL (ref 6.5–8.1)

## 2021-12-12 LAB — CBG MONITORING, ED: Glucose-Capillary: 100 mg/dL — ABNORMAL HIGH (ref 70–99)

## 2021-12-12 LAB — PROTIME-INR
INR: 1 (ref 0.8–1.2)
Prothrombin Time: 12.8 seconds (ref 11.4–15.2)

## 2021-12-12 LAB — APTT: aPTT: 33 seconds (ref 24–36)

## 2021-12-12 LAB — HIV ANTIBODY (ROUTINE TESTING W REFLEX): HIV Screen 4th Generation wRfx: NONREACTIVE

## 2021-12-12 MED ORDER — OXYCODONE HCL 5 MG PO TABS
5.0000 mg | ORAL_TABLET | Freq: Four times a day (QID) | ORAL | Status: DC | PRN
  Administered 2021-12-12 – 2021-12-13 (×3): 5 mg via ORAL
  Filled 2021-12-12 (×3): qty 1

## 2021-12-12 MED ORDER — IOHEXOL 350 MG/ML SOLN
100.0000 mL | Freq: Once | INTRAVENOUS | Status: AC | PRN
  Administered 2021-12-12: 100 mL via INTRAVENOUS

## 2021-12-12 MED ORDER — OXYBUTYNIN CHLORIDE ER 5 MG PO TB24
10.0000 mg | ORAL_TABLET | Freq: Every day | ORAL | Status: DC
Start: 2021-12-12 — End: 2021-12-13
  Administered 2021-12-12: 10 mg via ORAL
  Filled 2021-12-12 (×2): qty 1

## 2021-12-12 MED ORDER — INSULIN ASPART 100 UNIT/ML IJ SOLN: 0.0000 [IU] | Freq: Three times a day (TID) | INTRAMUSCULAR | Status: DC

## 2021-12-12 MED ORDER — ASPIRIN 325 MG PO TBEC
650.0000 mg | DELAYED_RELEASE_TABLET | Freq: Once | ORAL | Status: AC
Start: 2021-12-12 — End: 2021-12-12
  Administered 2021-12-12: 650 mg via ORAL
  Filled 2021-12-12: qty 2

## 2021-12-12 MED ORDER — ONDANSETRON HCL 4 MG/2ML IJ SOLN
4.0000 mg | Freq: Once | INTRAMUSCULAR | Status: AC
  Administered 2021-12-12: 4 mg via INTRAVENOUS
  Filled 2021-12-12: qty 2

## 2021-12-12 MED ORDER — SODIUM CHLORIDE 0.9% FLUSH: 3.0000 mL | Freq: Once | INTRAVENOUS | Status: DC

## 2021-12-12 MED ORDER — TRAZODONE HCL 50 MG PO TABS
50.0000 mg | ORAL_TABLET | Freq: Every evening | ORAL | Status: DC | PRN
  Administered 2021-12-12: 50 mg via ORAL
  Filled 2021-12-12: qty 1

## 2021-12-12 MED ORDER — ATORVASTATIN CALCIUM 40 MG PO TABS: 40.0000 mg | ORAL_TABLET | Freq: Every day | ORAL | Status: DC

## 2021-12-12 MED ORDER — LEVOTHYROXINE SODIUM 88 MCG PO TABS
88.0000 ug | ORAL_TABLET | Freq: Every day | ORAL | Status: DC
  Administered 2021-12-13: 88 ug via ORAL
  Filled 2021-12-12 (×2): qty 1

## 2021-12-12 MED ORDER — ENOXAPARIN SODIUM 40 MG/0.4ML IJ SOSY
40.0000 mg | PREFILLED_SYRINGE | INTRAMUSCULAR | Status: DC
  Administered 2021-12-12: 40 mg via SUBCUTANEOUS
  Filled 2021-12-12: qty 0.4

## 2021-12-12 MED ORDER — ASPIRIN 81 MG PO TBEC
81.0000 mg | DELAYED_RELEASE_TABLET | Freq: Every day | ORAL | Status: DC
Start: 2021-12-13 — End: 2021-12-13
  Administered 2021-12-13: 81 mg via ORAL
  Filled 2021-12-12: qty 1

## 2021-12-12 MED ORDER — BACLOFEN 10 MG PO TABS
10.0000 mg | ORAL_TABLET | Freq: Three times a day (TID) | ORAL | Status: DC
  Administered 2021-12-12 – 2021-12-13 (×3): 10 mg via ORAL
  Filled 2021-12-12 (×5): qty 1

## 2021-12-12 MED ORDER — VENLAFAXINE HCL ER 75 MG PO CP24
75.0000 mg | ORAL_CAPSULE | Freq: Every day | ORAL | Status: DC
  Administered 2021-12-13: 75 mg via ORAL
  Filled 2021-12-12: qty 1

## 2021-12-12 MED ORDER — SENNOSIDES-DOCUSATE SODIUM 8.6-50 MG PO TABS: 1.0000 | ORAL_TABLET | Freq: Every evening | ORAL | Status: DC | PRN

## 2021-12-12 MED ORDER — STROKE: EARLY STAGES OF RECOVERY BOOK
Freq: Once | Status: AC
  Filled 2021-12-12: qty 1

## 2021-12-12 MED ORDER — ALPRAZOLAM 0.25 MG PO TABS: 0.2500 mg | ORAL_TABLET | Freq: Every day | ORAL | Status: DC | PRN

## 2021-12-12 NOTE — Consult Note (Signed)
Neurology Consultation  Reason for Consult: CODE STROKE Referring Physician: Drenda Freeze, MD  CC: Right sied numbness and weakness  History is obtained from:Patient   HPI: Carol [name] is a 66 y.o. female with a past medical history significant for hypertension, anxiety, depression and low back pain. Patient states that she has been in a good state of health recently and had been home all day. She states that at 14:00 she was watching TV and had a sudden onset of dizziness, where she was somewhat disoriented and had right eye blurry vision. She noticed having a right quadrant deficit and could not see part of her TV.  She also noted having increased numbness of her RUE/RLE associated with some weakness. She is not on any ASA /Statin Therapy. Patient was worried about a stroke and called EMS from her cell phone. EMS arrived and @ the time she was able to get off the couch and opened the door.  EMS reported right sided numbness and visual changes. Patient was able to walk to the stretcher. No report of any drift, speech difficulty, facial droop, nausea, vomiting or syncope.   LKW: 1400 tpa given?: no,  Premorbid modified Rankin scale (mRS): 0   ROS: Full ROS was performed and is negative except as noted in the HPI. Patient able to provide medical information.  Past Medical History:  Diagnosis Date   Anxiety    Arthritis    Balance problems    Chronic anxiety    Chronic depression    Chronic diarrhea    after gallbladder surgery   Chronic low back pain    Chronic seasonal allergic rhinitis    Complication of anesthesia    pt states has difficulty with being put to sleep    Exogenous obesity    Falls    Family history of adverse reaction to anesthesia    pts daughter has N&V   Family history of breast cancer    Family history of prostate cancer    Fatigue    Hemorrhoids    History of blood clots    History of blood transfusion    2006   History of bronchitis    History  of chicken pox    History of frequent urinary tract infections    History of gallstones    History of jaundice    History of kidney stones    History of measles as a child    History of mumps as a child    Hypothyroidism    Insomnia    Night sweats    Personal history of radiation therapy    Pneumonia    hx of    Polycythemia    PONV (postoperative nausea and vomiting)    Poor sleep    chronic poor quality sleep wit snoring, sleep study in Nov 2013 showed mild central sleep apnea with hypoxia felt due to narcotics, no significant OSA or RLS   Thyroid disease    Tinnitus    Type II diabetes mellitus ([place]) 11/2016        Family History  Problem Relation Age of Onset   Hypertension Mother    COPD Mother    Diabetes Mother    Heart attack Father 49   Prostate cancer Maternal Uncle 16   Myasthenia gravis Daughter    Depression Daughter    Other Son        esophageal dysmotility, [employer]   Diabetes type II Other  Other Other        premature cardiovascular disease   Breast cancer Other 60       bilateral, mother's first cousin   Prostate cancer Other        dx 75s, maternal cousin's son     Social History:   reports that she has never smoked. She has never used smokeless tobacco. She reports that she does not drink alcohol and does not use drugs.  Medications  Current Facility-Administered Medications:    sodium chloride flush (NS) 0.9 % injection 3 mL, 3 mL, Intravenous, Once, Drenda Freeze, MD  Current Outpatient Medications:    ALPRAZolam (XANAX) 0.25 MG tablet, Take 0.25 mg by mouth daily as needed for anxiety., Disp: , Rfl:    Ascorbic Acid (VITAMIN C) 1000 MG tablet, Take 1,000 mg by mouth daily., Disp: , Rfl:    baclofen (LIORESAL) 10 MG tablet, Take 10 mg by mouth 3 (three) times daily., Disp: , Rfl:    Biotin 10 MG CAPS, Take 10 mg by mouth daily., Disp: , Rfl:    levothyroxine (SYNTHROID) 88 MCG tablet, Take 88 mcg by mouth daily before  breakfast., Disp: , Rfl:    ondansetron (ZOFRAN-ODT) 8 MG disintegrating tablet, Take 8 mg by mouth every 8 (eight) hours as needed for vomiting or nausea., Disp: , Rfl:    oxybutynin (DITROPAN-XL) 10 MG 24 hr tablet, Take 10 mg by mouth at bedtime., Disp: , Rfl:    oxyCODONE (OXY IR/ROXICODONE) 5 MG immediate release tablet, Take 1 tablet (5 mg total) by mouth every 6 (six) hours as needed for severe pain. (Patient taking differently: Take 5 mg by mouth in the morning, at noon, and at bedtime.), Disp: 20 tablet, Rfl: 0   Semaglutide,0.25 or 0.'5MG'$ /DOS, (OZEMPIC, 0.25 OR 0.5 MG/DOSE,) 2 MG/1.5ML SOPN, Inject 0.5 mg into the skin every Sunday., Disp: , Rfl:    traZODone (DESYREL) 50 MG tablet, Take 50 mg by mouth at bedtime as needed for sleep., Disp: , Rfl:    venlafaxine XR (EFFEXOR-XR) 75 MG 24 hr capsule, Take 75 mg by mouth daily with breakfast., Disp: , Rfl:    Exam: Current vital signs: Wt 107.4 kg   BMI 37.64 kg/m  Vital signs in last 24 hours: Weight:  [107.4 kg] 107.4 kg (05/21 1709)  GENERAL: Well nourished, Well developed female Cooperative , pleasant and NAD HEENT: - Normocephalic and atraumatic, dry mm, no LN++, no Thyromegally LUNGS - Clear to auscultation bilaterally with no wheezes CV - S1S2 RRR, no m/r/g, equal pulses bilaterally. ABDOMEN - Soft, nontender, nondistended with normoactive BS Ext: warm, well perfused, intact peripheral pulses, no edema  NEURO:  Mental Status: AA&Ox3 , follows commands, able to recite correct dob/age/ Hospital location and name  Language: speech is Normal ,Naming, repetition, fluency, and comprehension intact. Cranial Nerves: PERRL 43m-1mm/brisk. EOMI, + visual field  changes with right hemianopsia , no facial asymmetry, facial sensation intact, hearing intact, tongue/uvula/soft palate midline, normal sternocleidomastoid and trapezius muscle strength. No evidence of tongue atrophy  Motor: RUE 5-/5 with give away weakness   RLE 5-/5 with give  away weakness No drift of upper or lower extremities when performing NIHSS Tone: is normal and bulk is normal Sensation- Decreased  to light touch bilaterally with intermittent extension Coordination: FTN intact bilaterally, no ataxia in BLE. Gait- deferred  NIHSS: 2   Labs I have reviewed labs in epic and the results pertinent to this consultation are:   CBC  Component Value Date/Time   WBC 7.3 12/12/2021 1708   RBC 4.64 12/12/2021 1708   HGB 14.3 12/12/2021 1710   HGB 14.2 02/10/2021 1234   HCT 42.0 12/12/2021 1710   PLT 227 12/12/2021 1708   PLT 234 02/10/2021 1234   MCV 91.6 12/12/2021 1708   MCH 29.3 12/12/2021 1708   MCHC 32.0 12/12/2021 1708   RDW 13.7 12/12/2021 1708   LYMPHSABS 1.8 12/12/2021 1708   MONOABS 0.6 12/12/2021 1708   EOSABS 0.2 12/12/2021 1708   BASOSABS 0.0 12/12/2021 1708    CMP     Component Value Date/Time   NA 141 12/12/2021 1710   K 3.8 12/12/2021 1710   CL 104 12/12/2021 1710   CO2 24 12/12/2021 1708   GLUCOSE 103 (H) 12/12/2021 1710   BUN 11 12/12/2021 1710   CREATININE 0.70 12/12/2021 1710   CREATININE 0.79 02/10/2021 1234   CALCIUM 8.7 (L) 12/12/2021 1708   PROT 6.5 12/12/2021 1708   ALBUMIN 3.2 (L) 12/12/2021 1708   AST 19 12/12/2021 1708   AST 25 02/10/2021 1234   ALT 13 12/12/2021 1708   ALT 31 02/10/2021 1234   ALKPHOS 86 12/12/2021 1708   BILITOT 0.4 12/12/2021 1708   BILITOT 0.6 02/10/2021 1234   GFRNONAA >60 12/12/2021 1708   GFRNONAA >60 02/10/2021 1234   GFRAA >60 09/27/2017 1350    Lipid Panel     Component Value Date/Time   CHOL 169 05/06/2011 2254   TRIG 183 (H) 05/06/2011 2254   HDL 44 05/06/2011 2254   CHOLHDL 3.8 05/06/2011 2254   VLDL 37 05/06/2011 2254   LDLCALC 88 05/06/2011 2254     Imaging I have reviewed the images obtained:  CT-head non contrast (Code Stroke) No acute intracranial hemorrhage or evidence of acute infarction.    CTA Head/Neck with Contrast: (CODE STROKE) No large  vessel occlusion, hemodynamically significant stenosis, or evidence of dissection.    Assessment: This is a 66 yo female with a PMHx of HTN who presents with sudden onset of right sided paresthesia, visual obscuration consisting of a right upper quadrant scotoma OD, and weakness.  - Exam reveals NIHSS of 2. Symptoms and signs are mild.  - Patient's imaging studies were negative for any acute pathology  and her symptoms were improving. She had no large vessel occlusion or stenosis with low NIHSS.  - Discussed risks/benefits of TNK administration versus no administration per standard protocol. At the end of the discussion, when the patient was informed of the risk of hemorrhage possibly leading to permanent and/or severe neurological deficit, she declined TNK.  - CT head negative. - No LVO on CTA of head and neck  Recommendations: -Admit for stroke work up -Neuro checks q 4 hours -Start ASA 81 mg po qday -Start Lipitor 40 mg po qhs -MRI Brain non contrast -TTE -Lipid Panel -HgbA1c -TSH -Telemetry -PT/OT/SLT evaluations    -- Elenora Gamma, PA-C Neurohospitalist [facility]  I have seen and evaluated the patient. I have formulated the assessment and recommendations. 66 year old female presenting with acute onset of right sided paresthesia, visual obscuration consisting of a right upper quadrant scotoma OD, and weakness. Exam reveals NIHSS of 2. Symptoms and signs are mild. Recommendations as above.  Electronically signed: Dr. Kerney Elbe

## 2021-12-12 NOTE — ED Notes (Signed)
RN informed hospitalist provider that pt passed swallow screen

## 2021-12-12 NOTE — ED Provider Notes (Signed)
[facility]PART[facility] Provider Note   CSN: [id number] Arrival date & time: 12/12/21  1701  An emergency department physician performed an initial assessment on this suspected stroke patient at 28.  History  Chief Complaint  Patient presents with   Code Stroke    Carol [name] is a 66 y.o. female history of breast cancer, diabetes, anxiety here presenting with shortness of breath and dizziness and right-sided weakness and blurry vision.  Patient had a cute onset of shortness of breath around noon today.  Around 2 PM, patient was watching TV and had a cute onset of blurry vision and also right-sided weakness and numbness.  She states that it did not go away so EMS was called and code stroke was activated prior to arrival.  Patient is not on blood thinner and does not have a history of strokes in the past  The history is provided by the patient.      Home Medications Prior to Admission medications   Medication Sig Start Date End Date Taking? Authorizing Provider  ALPRAZolam Duanne Moron) 0.25 MG tablet Take 0.25 mg by mouth daily as needed for anxiety.    [provider]  Ascorbic Acid (VITAMIN C) 1000 MG tablet Take 1,000 mg by mouth daily.    [provider]  baclofen (LIORESAL) 10 MG tablet Take 10 mg by mouth 3 (three) times daily.    [provider]  Biotin 10 MG CAPS Take 10 mg by mouth daily.    [provider]  levothyroxine (SYNTHROID) 88 MCG tablet Take 88 mcg by mouth daily before breakfast.    [provider]  ondansetron (ZOFRAN-ODT) 8 MG disintegrating tablet Take 8 mg by mouth every 8 (eight) hours as needed for vomiting or nausea.    [provider]  oxybutynin (DITROPAN-XL) 10 MG 24 hr tablet Take 10 mg by mouth at bedtime.    [provider]  oxyCODONE (OXY IR/ROXICODONE) 5 MG immediate release tablet Take 1 tablet (5 mg total) by mouth every 6 (six) hours as needed for severe  pain. Patient taking differently: Take 5 mg by mouth in the morning, at noon, and at bedtime. 03/25/21   Stark Klein, MD  Semaglutide,0.25 or 0.'5MG'$ /DOS, (OZEMPIC, 0.25 OR 0.5 MG/DOSE,) 2 MG/1.5ML SOPN Inject 0.5 mg into the skin every Sunday.    [provider]  traZODone (DESYREL) 50 MG tablet Take 50 mg by mouth at bedtime as needed for sleep.    [provider]  venlafaxine XR (EFFEXOR-XR) 75 MG 24 hr capsule Take 75 mg by mouth daily with breakfast.    [provider]      Allergies    Acetaminophen, Celebrex [celecoxib], Povidone iodine, Tape, Codeine, and Doxycycline    Review of Systems   Review of Systems  Neurological:  Positive for dizziness and numbness.  All other systems reviewed and are negative.  Physical Exam Updated Vital Signs Wt 107.4 kg   BMI 37.64 kg/m  Physical Exam Vitals and nursing note reviewed.  Constitutional:      Comments: Slightly anxious  HENT:     Head: Normocephalic.     Nose: Nose normal.     Mouth/Throat:     Mouth: Mucous membranes are moist.  Eyes:     Extraocular Movements: Extraocular movements intact.     Pupils: Pupils are equal, round, and reactive to light.  Cardiovascular:     Rate and Rhythm: Normal rate and regular rhythm.  Pulses: Normal pulses.     Heart sounds: Normal heart sounds.  Pulmonary:     Effort: Pulmonary effort is normal.     Breath sounds: Normal breath sounds.  Abdominal:     General: Abdomen is flat.     Palpations: Abdomen is soft.  Musculoskeletal:        General: Normal range of motion.     Cervical back: Normal range of motion and neck supple.  Skin:    General: Skin is warm.  Neurological:     Comments: Patient has pronator drift on the right side.  Slightly decreased sensation on the right arm and leg.  Patient has no facial droop and no slurred speech.  Psychiatric:        Mood and Affect: Mood normal.        Behavior: Behavior normal.    ED Results / Procedures /  Treatments   Labs (all labs ordered are listed, but only abnormal results are displayed) Labs Reviewed  COMPREHENSIVE METABOLIC PANEL - Abnormal; Notable for the following components:      Result Value   Glucose, Bld 105 (*)    Calcium 8.7 (*)    Albumin 3.2 (*)    All other components within normal limits  I-STAT CHEM 8, ED - Abnormal; Notable for the following components:   Glucose, Bld 103 (*)    Calcium, Ion 1.06 (*)    All other components within normal limits  PROTIME-INR  APTT  CBC  DIFFERENTIAL  CBG MONITORING, ED    EKG None  Radiology CT HEAD CODE STROKE WO CONTRAST  Result Date: 12/12/2021 CLINICAL DATA:  Code stroke.  Neuro deficit, acute, stroke suspected EXAM: CT HEAD WITHOUT CONTRAST TECHNIQUE: Contiguous axial images were obtained from the base of the skull through the vertex without intravenous contrast. RADIATION DOSE REDUCTION: This exam was performed according to the departmental dose-optimization program which includes automated exposure control, adjustment of the mA and/or kV according to patient size and/or use of iterative reconstruction technique. COMPARISON:  February 2022 FINDINGS: Brain: There is no acute intracranial hemorrhage, mass effect, or edema. Gray-white differentiation is preserved. There is no extra-axial fluid collection. Ventricles and sulci are within normal limits in size and configuration. Possible patchy low-density in the region of the left caudate head was present on the prior study. Vascular: No hyperdense vessel.There is atherosclerotic calcification at the skull base. Skull: Calvarium is unremarkable. Sinuses/Orbits: No acute finding. Other: None. ASPECTS ([place] Stroke Program Early CT Score) - Ganglionic level infarction (caudate, lentiform nuclei, internal capsule, insula, M1-M3 cortex): 7 - Supraganglionic infarction (M4-M6 cortex): 3 Total score (0-10 with 10 being normal): 10 IMPRESSION: There is no acute intracranial hemorrhage or  evidence of acute infarction. ASPECT score is 10. These results were communicated to Dr. Cheral Marker at 5:18 pm on 12/12/2021 by text page via the [facility] messaging system. Electronically Signed   By: Macy Mis M.D.   On: 12/12/2021 17:20   CT ANGIO HEAD NECK W WO CM (CODE STROKE)  Result Date: 12/12/2021 CLINICAL DATA:  Neuro deficit, acute, stroke suspected EXAM: CT ANGIOGRAPHY HEAD AND NECK TECHNIQUE: Multidetector CT imaging of the head and neck was performed using the standard protocol during bolus administration of intravenous contrast. Multiplanar CT image reconstructions and MIPs were obtained to evaluate the vascular anatomy. Carotid stenosis measurements (when applicable) are obtained utilizing NASCET criteria, using the distal internal carotid diameter as the denominator. RADIATION DOSE REDUCTION: This exam was performed according to the departmental  dose-optimization program which includes automated exposure control, adjustment of the mA and/or kV according to patient size and/or use of iterative reconstruction technique. CONTRAST:  179m OMNIPAQUE IOHEXOL 350 MG/ML SOLN COMPARISON:  None Available. FINDINGS: CTA NECK Aortic arch: Minimal calcified plaque. Great vessel origins are patent. Right carotid system: Patent.  No stenosis. Left carotid system: Patent.  No stenosis. Vertebral arteries: Patent. Direct origin of the left vertebral from the aortic arch. Portions are difficult to visualize due to streak artifact from adjacent venous contrast. Right vertebral is dominant. No definite stenosis. Skeleton: Mild cervical spine degenerative changes. Other neck: Unremarkable. Upper chest: No apical lung mass. Review of the MIP images confirms the above findings CTA HEAD Anterior circulation: Intracranial internal carotid arteries are patent with mild calcified plaque. Anterior cerebral arteries are patent. Anterior communicating artery is present. Middle cerebral arteries are patent. Posterior  circulation: Intracranial vertebral arteries are patent. Basilar artery is patent. Major cerebellar artery origins are patent. Right posterior communicating artery is present. Posterior cerebral arteries are patent. Venous sinuses: Patent as allowed by contrast bolus timing. Review of the MIP images confirms the above findings IMPRESSION: No large vessel occlusion, hemodynamically significant stenosis, or evidence of dissection. Electronically Signed   By: PMacy MisM.D.   On: 12/12/2021 17:54    Procedures Procedures    Angiocath insertion Performed by: DWandra Arthurs Consent: Verbal consent obtained. Risks and benefits: risks, benefits and alternatives were discussed Time out: Immediately prior to procedure a "time out" was called to verify the correct patient, procedure, equipment, support staff and site/side marked as required.  Preparation: Patient was prepped and draped in the usual sterile fashion.  Vein Location: L antecube   Ultrasound Guided  Gauge: 20 long   Normal blood return and flush without difficulty Patient tolerance: Patient tolerated the procedure well with no immediate complications.    Medications Ordered in ED Medications  sodium chloride flush (NS) 0.9 % injection 3 mL (has no administration in time range)  aspirin EC tablet 650 mg (has no administration in time range)  aspirin EC tablet 81 mg (has no administration in time range)  iohexol (OMNIPAQUE) 350 MG/ML injection 100 mL (100 mLs Intravenous Contrast Given 12/12/21 1732)    ED Course/ Medical Decision Making/ A&P                           Medical Decision Making LALESHKA CORNEYis a 66y.o. female here presenting with dizziness and right arm and leg weakness and numbness.  Patient has NIH scale of 2 and has pronator drift on arrival.  However her symptoms are improving.  No tPA per neurology.  Per Dr. LCheral Marker patient will need CTA and medical admission for TIA work-up  6:27 PM I reviewed  patient's labs and independently reviewed her imaging.  Patient has no LVO on CTA.  Neurology recommend TIA work-up in the hospital.  Stroke team to follow   Problems Addressed: TIA (transient ischemic attack): acute illness or injury  Amount and/or Complexity of Data Reviewed Labs: ordered. Decision-making details documented in ED Course. Radiology: ordered and independent interpretation performed. Decision-making details documented in ED Course. ECG/medicine tests: ordered and independent interpretation performed. Decision-making details documented in ED Course.  Risk Decision regarding hospitalization.    Final Clinical Impression(s) / ED Diagnoses Final diagnoses:  None    Rx / DC Orders ED Discharge Orders     None  Drenda Freeze, MD 12/12/21 [phone]

## 2021-12-12 NOTE — H&P (Addendum)
History and Physical   Carol [name] YBO:[id number] DOB: 07/30/1955 DOA: 12/12/2021  PCP: [name], MD   Patient coming from: Home  Chief Complaint: Focal Deficit, shortness of breath  HPI: Carol [name] is a 66 y.o. female with medical history significant of depression, anxiety, diverticulosis, breast cancer, blood clots, diabetes, hypothyroidism presenting with shortness of breath, right-sided symptoms and blurry vision.  Patient reports that around noon today had subacute onset of shortness of breath which is improved.  Around 2 PM she was watching TV and she had cute onset of blurry vision with right-sided weakness and numbness.  Called EMS as symptoms did not improve arrived as a code stroke.  Symptoms have been improving while in the ED. she reported some residual intermittent blurred vision and right-sided numbness of the upper extremity and face.  She does report a couple days of intermittent palpitations.  Patient denies fevers, chills, chest pain, abdominal pain, constipation, diarrhea, nausea, vomiting.  ED Course: Vital signs in the ED stable.  Lab work-up included CMP with glucose 105, calcium 8.7, albumin 3.2.  CBC within normal limits.  PT, PTT, INR within normal limits.  Chest x-ray showed no acute normality.  CT head showed no acute abnormality.  CTA of the head and neck showed no acute abnormality.  Patient received aspirin and Zofran in the ED.  Neurology has been consulted and are following.  Review of Systems: As per HPI otherwise all other systems reviewed and are negative.  Past Medical History:  Diagnosis Date   Anxiety    Arthritis    Balance problems    Chronic anxiety    Chronic depression    Chronic diarrhea    after gallbladder surgery   Chronic low back pain    Chronic seasonal allergic rhinitis    Complication of anesthesia    pt states has difficulty with being put to sleep    Exogenous obesity    EXTERNAL HEMORRHOIDS 06/26/2007   Annotation:  and internal Qualifier: Diagnosis of  By: Garen Grams     Falls    Family history of adverse reaction to anesthesia    pts daughter has N&V   Family history of breast cancer    Family history of prostate cancer    Fatigue    Hemorrhoids    History of blood clots    History of blood transfusion    2006   History of bronchitis    History of chicken pox    History of frequent urinary tract infections    History of gallstones    History of jaundice    History of kidney stones    History of measles as a child    History of mumps as a child    Hypothyroidism    Insomnia    Night sweats    Personal history of radiation therapy    Pneumonia    hx of    Polycythemia    PONV (postoperative nausea and vomiting)    Poor sleep    chronic poor quality sleep wit snoring, sleep study in Nov 2013 showed mild central sleep apnea with hypoxia felt due to narcotics, no significant OSA or RLS   Thyroid disease    Tinnitus    Type II diabetes mellitus ([place]) 11/2016    Past Surgical History:  Procedure Laterality Date   ABDOMINAL HYSTERECTOMY     APPENDECTOMY  1972   BACK SURGERY     BREAST BIOPSY Right  years ago- unsure when   BREAST LUMPECTOMY     BREAST LUMPECTOMY WITH RADIOACTIVE SEED LOCALIZATION Right 03/25/2021   Procedure: RIGHT BREAST LUMPECTOMY WITH RADIOACTIVE SEED LOCALIZATION X2;  Surgeon: Stark Klein, MD;  Location: [place] [facility] [facility];  Service: General;  Laterality: Right;   CHOLECYSTECTOMY  1987   COLONOSCOPY  11/2011   showed mild diverticulosis, internal hemorrhoids, and no polyps   ENDOSCOPY with esophageal dilatation  03/14/2016   KNEE ARTHROSCOPY Right 01/07/2016   Procedure: ARTHROSCOPY RIGHT KNEE, DEBRIDEMENT OF ARTHROFIBROSIS, AND EXAM UNDER ANESTHESIA;  Surgeon: Susa Day, MD;  Location: [facility];  Service: Orthopedics;  Laterality: Right;   KNEE ARTHROSCOPY Right 10/04/2017   Procedure: Right knee arthroscopy, evaluation under anesthesia, lysis of adhesions;   Surgeon: Susa Day, MD;  Location: [facility];  Service: Orthopedics;  Laterality: Right;  60 mins   KNEE SURGERY     L3/4  fusion  2004   L3/4 discectomy  2003   L4/5 discectomy  2001   L4/5 fusion  2002   left foot surgery      to repair break - 2002   left knee meniscectomy  2012   lumbar spine ESI  12/2017   menicus tear     bilat;    RE-EXCISION OF BREAST LUMPECTOMY Right 04/29/2021   Procedure: RE-EXCISION OF RIGHT BREAST LUMPECTOMY;  Surgeon: Stark Klein, MD;  Location: [place];  Service: General;  Laterality: Right;   right knee arthroscopy  12/2015   Dr. Tonita Cong   right knee meniscectomy  2012   TONSILLECTOMY     TOTAL KNEE ARTHROPLASTY Right 03/27/2015   Procedure: RIGHT TOTAL KNEE ARTHROPLASTY;  Surgeon: Susa Day, MD;  Location: [facility];  Service: Orthopedics;  Laterality: Right;   total knee replacement Right 03/2015   VAGINAL HYSTERECTOMY  1996   with ovaries intact, accidental bladder laceration   WRIST SURGERY  11/2016   Dr. Amedeo Plenty    Social History  reports that she has never smoked. She has never used smokeless tobacco. She reports that she does not drink alcohol and does not use drugs.  Allergies  Allergen Reactions   Acetaminophen Nausea And Vomiting   Celebrex [Celecoxib] Itching   Povidone Iodine Hives    Topical    Tape     Blisters    Codeine Rash   Doxycycline Rash    Family History  Problem Relation Age of Onset   Hypertension Mother    COPD Mother    Diabetes Mother    Heart attack Father 44   Prostate cancer Maternal Uncle 17   Myasthenia gravis Daughter    Depression Daughter    Other Son        esophageal dysmotility, [employer]   Diabetes type II Other    Other Other        premature cardiovascular disease   Breast cancer Other 45       bilateral, mother's first cousin   Prostate cancer Other        dx 51s, maternal cousin's son  Reviewed on admission  Prior to Admission medications   Medication Sig Start Date End  Date Taking? Authorizing Provider  ALPRAZolam Duanne Moron) 0.25 MG tablet Take 0.25 mg by mouth daily as needed for anxiety.    [provider]  Ascorbic Acid (VITAMIN C) 1000 MG tablet Take 1,000 mg by mouth daily.    [provider]  baclofen (LIORESAL) 10 MG tablet Take 10 mg by mouth 3 (three) times daily.  [provider]  Biotin 10 MG CAPS Take 10 mg by mouth daily.    [provider]  levothyroxine (SYNTHROID) 88 MCG tablet Take 88 mcg by mouth daily before breakfast.    [provider]  ondansetron (ZOFRAN-ODT) 8 MG disintegrating tablet Take 8 mg by mouth every 8 (eight) hours as needed for vomiting or nausea.    [provider]  oxybutynin (DITROPAN-XL) 10 MG 24 hr tablet Take 10 mg by mouth at bedtime.    [provider]  oxyCODONE (OXY IR/ROXICODONE) 5 MG immediate release tablet Take 1 tablet (5 mg total) by mouth every 6 (six) hours as needed for severe pain. Patient taking differently: Take 5 mg by mouth in the morning, at noon, and at bedtime. 03/25/21   Stark Klein, MD  Semaglutide,0.25 or 0.'5MG'$ /DOS, (OZEMPIC, 0.25 OR 0.5 MG/DOSE,) 2 MG/1.5ML SOPN Inject 0.5 mg into the skin every Sunday.    [provider]  traZODone (DESYREL) 50 MG tablet Take 50 mg by mouth at bedtime as needed for sleep.    [provider]  venlafaxine XR (EFFEXOR-XR) 75 MG 24 hr capsule Take 75 mg by mouth daily with breakfast.    [provider]    Physical Exam: Vitals:   12/12/21 1845 12/12/21 1900 12/12/21 1913 12/12/21 1919  BP:  125/61 125/81   Pulse: 67 80 69   Resp: '15 12 17   '$ Temp:    98.2 F (36.8 C)  TempSrc:    Oral  SpO2: 95%  98%   Weight:        Physical Exam Constitutional:      General: She is not in acute distress.    Appearance: Normal appearance.  HENT:     Head: Normocephalic and atraumatic.     Mouth/Throat:     Mouth: Mucous membranes are moist.     Pharynx: Oropharynx is clear.   Eyes:     Extraocular Movements: Extraocular movements intact.     Pupils: Pupils are equal, round, and reactive to light.  Cardiovascular:     Rate and Rhythm: Normal rate and regular rhythm.     Pulses: Normal pulses.     Heart sounds: Normal heart sounds.  Pulmonary:     Effort: Pulmonary effort is normal. No respiratory distress.     Breath sounds: Normal breath sounds.  Abdominal:     General: Bowel sounds are normal. There is no distension.     Palpations: Abdomen is soft.     Tenderness: There is no abdominal tenderness.  Musculoskeletal:        General: No swelling or deformity.  Skin:    General: Skin is warm and dry.  Neurological:     Comments: Mental Status: Patient is awake, alert, oriented x3 No signs of aphasia or neglect Cranial Nerves: II: Pupils equal, round, and reactive to light.  (Patient reports some intermittent blurred vision.) III,IV, VI: EOMI without ptosis or diploplia.  V: Facial sensation is symmetric to light touch. VII: Facial movement is symmetric.  VIII: hearing is intact to voice X: Uvula elevates symmetrically XI: Shoulder shrug is symmetric. XII: tongue is midline without atrophy or fasciculations.  Motor: Good effort thorughout, at Least 5/5 bilateral UE, 5/5 bilateral lower extremitiy  Sensory: Numbness reported at right upper extremity and right face. Cerebellar: Finger-Nose intact bilalat      Labs on Admission: I have personally reviewed following labs and imaging studies  CBC: Recent Labs  Lab 12/12/21 1708 12/12/21  1710  WBC 7.3  --   NEUTROABS 4.6  --   HGB 13.6 14.3  HCT 42.5 42.0  MCV 91.6  --   PLT 227  --     Basic Metabolic Panel: Recent Labs  Lab 12/12/21 1708 12/12/21 1710  NA 140 141  K 3.9 3.8  CL 107 104  CO2 24  --   GLUCOSE 105* 103*  BUN 10 11  CREATININE 0.77 0.70  CALCIUM 8.7*  --     GFR: Estimated Creatinine Clearance: 88.9 mL/min (by C-G formula based on SCr of 0.7 mg/dL).  Liver  Function Tests: Recent Labs  Lab 12/12/21 1708  AST 19  ALT 13  ALKPHOS 86  BILITOT 0.4  PROT 6.5  ALBUMIN 3.2*    Urine analysis:    Component Value Date/Time   COLORURINE YELLOW 02/11/2016 1729   APPEARANCEUR CLOUDY (A) 02/11/2016 1729   LABSPEC 1.020 02/11/2016 1729   PHURINE 5.5 02/11/2016 1729   GLUCOSEU NEGATIVE 02/11/2016 1729   HGBUR NEGATIVE 02/11/2016 1729   BILIRUBINUR NEGATIVE 02/11/2016 1729   KETONESUR NEGATIVE 02/11/2016 1729   PROTEINUR NEGATIVE 02/11/2016 1729   NITRITE NEGATIVE 02/11/2016 1729   LEUKOCYTESUR NEGATIVE 02/11/2016 1729    Radiological Exams on Admission: DG Chest Port 1 View  Result Date: 12/12/2021 CLINICAL DATA:  Shortness of breath EXAM: PORTABLE CHEST 1 VIEW COMPARISON:  02/12/2011 FINDINGS: Heart and mediastinal contours are within normal limits. No focal opacities or effusions. No acute bony abnormality. IMPRESSION: No active disease. Electronically Signed   By: Rolm Baptise M.D.   On: 12/12/2021 18:29   CT HEAD CODE STROKE WO CONTRAST  Result Date: 12/12/2021 CLINICAL DATA:  Code stroke.  Neuro deficit, acute, stroke suspected EXAM: CT HEAD WITHOUT CONTRAST TECHNIQUE: Contiguous axial images were obtained from the base of the skull through the vertex without intravenous contrast. RADIATION DOSE REDUCTION: This exam was performed according to the departmental dose-optimization program which includes automated exposure control, adjustment of the mA and/or kV according to patient size and/or use of iterative reconstruction technique. COMPARISON:  February 2022 FINDINGS: Brain: There is no acute intracranial hemorrhage, mass effect, or edema. Gray-white differentiation is preserved. There is no extra-axial fluid collection. Ventricles and sulci are within normal limits in size and configuration. Possible patchy low-density in the region of the left caudate head was present on the prior study. Vascular: No hyperdense vessel.There is atherosclerotic  calcification at the skull base. Skull: Calvarium is unremarkable. Sinuses/Orbits: No acute finding. Other: None. ASPECTS ([place] Stroke Program Early CT Score) - Ganglionic level infarction (caudate, lentiform nuclei, internal capsule, insula, M1-M3 cortex): 7 - Supraganglionic infarction (M4-M6 cortex): 3 Total score (0-10 with 10 being normal): 10 IMPRESSION: There is no acute intracranial hemorrhage or evidence of acute infarction. ASPECT score is 10. These results were communicated to Dr. Cheral Marker at 5:18 pm on 12/12/2021 by text page via the [facility] messaging system. Electronically Signed   By: Macy Mis M.D.   On: 12/12/2021 17:20   CT ANGIO HEAD NECK W WO CM (CODE STROKE)  Result Date: 12/12/2021 CLINICAL DATA:  Neuro deficit, acute, stroke suspected EXAM: CT ANGIOGRAPHY HEAD AND NECK TECHNIQUE: Multidetector CT imaging of the head and neck was performed using the standard protocol during bolus administration of intravenous contrast. Multiplanar CT image reconstructions and MIPs were obtained to evaluate the vascular anatomy. Carotid stenosis measurements (when applicable) are obtained utilizing NASCET criteria, using the distal internal carotid diameter as the denominator. RADIATION DOSE REDUCTION: This  exam was performed according to the departmental dose-optimization program which includes automated exposure control, adjustment of the mA and/or kV according to patient size and/or use of iterative reconstruction technique. CONTRAST:  134m OMNIPAQUE IOHEXOL 350 MG/ML SOLN COMPARISON:  None Available. FINDINGS: CTA NECK Aortic arch: Minimal calcified plaque. Great vessel origins are patent. Right carotid system: Patent.  No stenosis. Left carotid system: Patent.  No stenosis. Vertebral arteries: Patent. Direct origin of the left vertebral from the aortic arch. Portions are difficult to visualize due to streak artifact from adjacent venous contrast. Right vertebral is dominant. No definite stenosis.  Skeleton: Mild cervical spine degenerative changes. Other neck: Unremarkable. Upper chest: No apical lung mass. Review of the MIP images confirms the above findings CTA HEAD Anterior circulation: Intracranial internal carotid arteries are patent with mild calcified plaque. Anterior cerebral arteries are patent. Anterior communicating artery is present. Middle cerebral arteries are patent. Posterior circulation: Intracranial vertebral arteries are patent. Basilar artery is patent. Major cerebellar artery origins are patent. Right posterior communicating artery is present. Posterior cerebral arteries are patent. Venous sinuses: Patent as allowed by contrast bolus timing. Review of the MIP images confirms the above findings IMPRESSION: No large vessel occlusion, hemodynamically significant stenosis, or evidence of dissection. Electronically Signed   By: PMacy MisM.D.   On: 12/12/2021 17:54    EKG: Independently reviewed.  Sinus rhythm at 70 bpm.  Low voltage multiple leads.  Assessment/Plan Principal Problem:   TIA (transient ischemic attack) Active Problems:   Diverticulosis of colon   Major depressive disorder, recurrent episode, severe (HCC)   Ductal carcinoma in situ (DCIS) of right breast   Hypothyroidism   Anxiety   Chronic low back pain   Transient ischemic attack > Patient presenting with onset of right-sided weakness and numbness with some associated blurry vision around 2 PM earlier today.  Called EMS as symptoms were persistent. > Right as code stroke, symptoms have improved while here.  CT head and CTA of the head and neck were normal. > Neurology consulted and are following, recommending TIA work-up. > Of note, patient does report some palpitations.  Only PVCs noted on telemetry monitoring thus far. - Appreciate neurology recommendations - Allow for permissive HTN  (systolic < 2992and diastolic < 1426 - Continue ASA  - Plan to start statin pending neurology recommendations  -  Echocardiogram  - Follow-up MRI brain - A1C  - Lipid panel  - Tele monitoring  - SLP eval - PT/OT  Anxiety Depression - Continue home as needed Xanax, as needed trazodone - Continue home dose of vaccine  Diabetes - SSI  Hypothyroidism - Continue home Synthroid  Breast cancer > DCIS, status post right seed localized lumpectomy and localized excision biopsy with reexcision.  And clean margins.  Excessive sweating - Continue home Oxybutynin  Chronic back pain - Continue home oxycodone and Baclofen  DVT prophylaxis: Lovenox  Code Status:   FUll  Family Communication:  None on admission, patient dates her family is aware of her admission and are up-to-date. Disposition Plan:   Patient is from:  Home  Anticipated DC to:  Home  Anticipated DC date:  1-2 days  Anticipated DC barriers: None  Consults called:  Neurology  Admission status:  Obs, telemetry   Severity of Illness: The appropriate patient status for this patient is OBSERVATION. Observation status is judged to be reasonable and necessary in order to provide the required intensity of service to ensure the patient's safety. The patient's presenting  symptoms, physical exam findings, and initial radiographic and laboratory data in the context of their medical condition is felt to place them at decreased risk for further clinical deterioration. Furthermore, it is anticipated that the patient will be medically stable for discharge from the hospital within 2 midnights of admission.    Marcelyn Bruins MD [facility]  How to contact the [facility] Attending or Consulting provider [place] or covering provider during after hours [place], for this patient?   Check the care team in [facility] and look for a) attending/consulting [facility] provider listed and b) the [facility] team listed Log into [email or web address] and use [facility]'s universal password to access. If you do not have the password, please contact the hospital operator. Locate the [facility]  provider you are looking for under [facility] and page to a number that you can be directly reached. If you still have difficulty reaching the provider, please page the [facility] (Director on Call) for the Hospitalists listed on amion for assistance.  12/12/2021, 8:18 PM

## 2021-12-12 NOTE — ED Triage Notes (Signed)
Pt arrived via [facility] as a code stroke. Pt was watching tv and had a sudden onset of dizziness then right sided weakness and numbness, right eye blurred vision. LKW 1400 today. Pt is A&Ox4. VSS.

## 2021-12-13 ENCOUNTER — Other Ambulatory Visit: Payer: Self-pay | Admitting: Cardiology

## 2021-12-13 ENCOUNTER — Observation Stay (HOSPITAL_COMMUNITY): Payer: Medicare HMO

## 2021-12-13 ENCOUNTER — Observation Stay (HOSPITAL_BASED_OUTPATIENT_CLINIC_OR_DEPARTMENT_OTHER): Payer: Medicare HMO

## 2021-12-13 DIAGNOSIS — J3489 Other specified disorders of nose and nasal sinuses: Secondary | ICD-10-CM | POA: Diagnosis not present

## 2021-12-13 DIAGNOSIS — E039 Hypothyroidism, unspecified: Secondary | ICD-10-CM | POA: Diagnosis not present

## 2021-12-13 DIAGNOSIS — G459 Transient cerebral ischemic attack, unspecified: Secondary | ICD-10-CM | POA: Diagnosis not present

## 2021-12-13 DIAGNOSIS — R42 Dizziness and giddiness: Secondary | ICD-10-CM

## 2021-12-13 DIAGNOSIS — R29818 Other symptoms and signs involving the nervous system: Secondary | ICD-10-CM | POA: Diagnosis not present

## 2021-12-13 LAB — LIPID PANEL
Cholesterol: 157 mg/dL (ref 0–200)
HDL: 41 mg/dL (ref >40)
LDL Cholesterol: 86 mg/dL (ref 0–99)
Total CHOL/HDL Ratio: 3.8 RATIO
Triglycerides: 148 mg/dL (ref <150)
VLDL: 30 mg/dL (ref 0–40)

## 2021-12-13 LAB — CBG MONITORING, ED
Glucose-Capillary: 124 mg/dL — ABNORMAL HIGH (ref 70–99)
Glucose-Capillary: 74 mg/dL (ref 70–99)

## 2021-12-13 LAB — ECHOCARDIOGRAM COMPLETE
Area-P 1/2: 3.65 cm2
S' Lateral: 2.8 cm
Weight: 3788.38 oz

## 2021-12-13 LAB — HEMOGLOBIN A1C
Hgb A1c MFr Bld: 5.6 % (ref 4.8–5.6)
Mean Plasma Glucose: 114.02 mg/dL

## 2021-12-13 LAB — GLUCOSE, CAPILLARY: Glucose-Capillary: 119 mg/dL — ABNORMAL HIGH (ref 70–99)

## 2021-12-13 MED ORDER — STUDY - OCEANIC-STROKE - ASUNDEXIAN 50 MG OR PLACEBO TABLET (PI-SETHI)
1.0000 | ORAL_TABLET | Freq: Every day | ORAL | Status: DC
  Administered 2021-12-13: 50 mg via ORAL
  Filled 2021-12-13 (×2): qty 1

## 2021-12-13 MED ORDER — ATORVASTATIN CALCIUM 40 MG PO TABS: 40.0000 mg | ORAL_TABLET | Freq: Every day | ORAL | 0 refills | Status: DC

## 2021-12-13 MED ORDER — SODIUM CHLORIDE 0.9 % IV SOLN: INTRAVENOUS | Status: DC

## 2021-12-13 MED ORDER — ASPIRIN 81 MG PO TBEC: 81.0000 mg | DELAYED_RELEASE_TABLET | Freq: Every day | ORAL | 12 refills | Status: DC

## 2021-12-13 MED ORDER — CLOPIDOGREL BISULFATE 75 MG PO TABS
75.0000 mg | ORAL_TABLET | Freq: Every day | ORAL | Status: DC
  Administered 2021-12-13: 75 mg via ORAL
  Filled 2021-12-13: qty 1

## 2021-12-13 MED ORDER — SODIUM CHLORIDE 0.9 % IV BOLUS
1000.0000 mL | INTRAVENOUS | Status: AC
  Administered 2021-12-13: 1000 mL via INTRAVENOUS

## 2021-12-13 MED ORDER — CLOPIDOGREL BISULFATE 75 MG PO TABS
75.0000 mg | ORAL_TABLET | Freq: Every day | ORAL | 0 refills | Status: AC
Start: 2021-12-14 — End: 2022-01-03

## 2021-12-13 MED ORDER — STUDY - OCEANIC-STROKE - ASUNDEXIAN 50 MG OR PLACEBO TABLET (PI-SETHI): 1.0000 | ORAL_TABLET | Freq: Every day | ORAL | 0 refills | Status: DC

## 2021-12-13 NOTE — ED Notes (Signed)
Patient blood pressure has increased to 85/61. Hospitalist physician aware and requests no further action at this time.

## 2021-12-13 NOTE — Plan of Care (Signed)

## 2021-12-13 NOTE — ED Notes (Signed)
Patient to MRI.

## 2021-12-13 NOTE — Progress Notes (Signed)
[facility] New Study Start: OCEANIC-STROKE   SUMMARY For more information refer to: [email or web address].[email or web address]. Study Identifier: [id number]    A Multicenter, International, Randomized, Placebo Controlled, Double-blind, Parallel Group and Event Driven Phase 3 Study of the Oral FXIa Inhibitor Asundexian ([place] [id number]) for the Prevention of Ischemic Stroke in Female and Female Participants Aged 65 Years and Older After an Acute Non-cardioembolic Ischemic Stroke or High-risk TIA  Brief Summary This study will randomize adult patients with an initial diagnosis of acute non-cardioembolic ischemic stroke or high-risk transient ischemic attack (TIA) to treatment within 72 hours of symptom onset and with the intention to be treated with antiplatelet therapy.  Design Phase 3, Randomized, Interventional, Event-Driven  Intervention Asundexian ([id number]) 50 mg tablets or matching placebo tablets (pink, oval, film-coated tablets  Concomitant Therapy Participants are to receive antiplatelet therapy (single or dual; provider discretion) during the study conduct.  Prohibited Therapy Oral anticoagulation Full dose and/or long-term anticoagulation therapy with heparin or LMWH  Chronic (more than 4 weeks continuous) therapy with NSAIDs during the study conduct Concomitant use of combined P-gp and strong/moderate CYP3A4 inducers Concomitant use of combined P-gp and strong CYP3A4 inhibitors Herbal/traditional medications and/or supplements with known anticoagulant/antiplatelet effects  Anticoagulation Prophylaxis Venous thromboembolism prophylaxis with LMWH or unfractionated heparin for short periods of time (2 weeks) is allowed.  Potential Drug-Drug Interactions Rosuvastatin 20 mg daily or higher or atorvastatin 80 mg (additional monitoring may be warranted due to increased concentrations of rosuvastatin and atorvastatin - asundexian ([id number]) is a weak inhibitor of CYP2C8)  Administration   Can be taken irrespective of food intake. Should be swallowed whole with water, preferably in the morning. CANNOT be crushed or broken.      Plan: Start Cecille Rubin [phone]) 50 mg tablets or placebo] today. Dose must be given by 1823. First doses will be hand delivered to nursing staff by pharmacy staff.    Please contact [facility] for any questions or concerns regarding the study medication.    Acey Lav, PharmD, [place] [facility] Pharmacist  [phone]

## 2021-12-13 NOTE — ED Notes (Signed)
Patient hypotensive. Patient is alert, oriented, calm, and easily arouses easily . Patient denies any concerns or complaints. Hospitalist paged to inform at this time.

## 2021-12-13 NOTE — Progress Notes (Signed)
PT Cancellation Note  Patient Details Name: Carol [name] MRN: [id number] DOB: 1956-04-06   Cancelled Treatment:    Reason Eval/Treat Not Completed: PT screened, no needs identified, will sign off Per OT, pt at her functional baseline with no skilled PT needs. Please re-consult if needs change.   Reuel Derby, PT, DPT  Acute Rehabilitation Services  Pager: [phone] Office: [phone]    Rudean Hitt 12/13/2021, 10:00 AM

## 2021-12-13 NOTE — ED Notes (Signed)
Patient returned from MRI. Patient alert and oriented and calm. Patient requests Q6H prn pain medication for back pain.

## 2021-12-13 NOTE — Progress Notes (Signed)
OT Cancellation Note  Patient Details Name: Carol [name] MRN: [id number] DOB: 11-11-1955   Cancelled Treatment:    Reason Eval/Treat Not Completed: Patient at procedure or test/ unavailable (ECHO.Will return as schedule allows.)  [place], OTR/L Acute Rehab Pager: [phone] Office: [phone] 12/13/2021, 8:28 AM

## 2021-12-13 NOTE — Progress Notes (Signed)
  Echocardiogram 2D Echocardiogram has been performed.  Darlina Sicilian M 12/13/2021, 9:02 AM

## 2021-12-13 NOTE — Discharge Instructions (Signed)
Follow with Primary MD [name], MD in 7 days   Get CBC, CMP, checked  by Primary MD next visit.    Activity: As tolerated with Full fall precautions use walker/cane & assistance as needed   Disposition Home    Diet: Heart Healthy    On your next visit with your primary care physician please Get Medicines reviewed and adjusted.   Please request your Prim.MD to go over all Hospital Tests and Procedure/Radiological results at the follow up, please get all Hospital records sent to your Prim MD by signing hospital release before you go home.   If you experience worsening of your admission symptoms, develop shortness of breath, life threatening emergency, suicidal or homicidal thoughts you must seek medical attention immediately by calling 911 or calling your MD immediately  if symptoms less severe.  You Must read complete instructions/literature along with all the possible adverse reactions/side effects for all the Medicines you take and that have been prescribed to you. Take any new Medicines after you have completely understood and accpet all the possible adverse reactions/side effects.   Do not drive, operating heavy machinery, perform activities at heights, swimming or participation in water activities or provide baby sitting services if your were admitted for syncope or siezures until you have seen by Primary MD or a Neurologist and advised to do so again.  Do not drive when taking Pain medications.    Do not take more than prescribed Pain, Sleep and Anxiety Medications  Special Instructions: If you have smoked or chewed Tobacco  in the last 2 yrs please stop smoking, stop any regular Alcohol  and or any Recreational drug use.  Wear Seat belts while driving.   Please note  You were cared for by a hospitalist during your hospital stay. If you have any questions about your discharge medications or the care you received while you were in the hospital after you are  discharged, you can call the unit and asked to speak with the hospitalist on call if the hospitalist that took care of you is not available. Once you are discharged, your primary care physician will handle any further medical issues. Please note that NO REFILLS for any discharge medications will be authorized once you are discharged, as it is imperative that you return to your primary care physician (or establish a relationship with a primary care physician if you do not have one) for your aftercare needs so that they can reassess your need for medications and monitor your lab values.

## 2021-12-13 NOTE — Progress Notes (Signed)
Patient admitted to 3w RM 12 at 32, Discharge instruction orders placed 1440

## 2021-12-13 NOTE — Progress Notes (Addendum)
STROKE TEAM PROGRESS NOTE   INTERVAL HISTORY Patient is seen in her room with no family at the bedside.  She reports that yesterday, she experienced sudden blurring of vision in her right eye with right lower quadrantanopia, dizziness and disorientation.  She also experienced weakness and numbness of her UE and RLE which lasted several hours and cleared this morning.  This morning her weakness is back to baseline but her symptoms have also resolved..  Dizziness, disorientation and right sided weakness and numbness resolved spontaneously, but blurred vision in the right eye persists.   She has history of intraductal breast carcinoma treated with lumpectomy and radiation.  She is currently not on chemotherapy and has a good life expectancy. Vitals:   12/13/21 0600 12/13/21 0657 12/13/21 0800 12/13/21 0900  BP: 99/60  107/64 96/62  Pulse: 69  67 72  Resp: '14  14 15  '$ Temp:  98 F (36.7 C)    TempSrc:  Oral    SpO2: 96%  93% 93%  Weight:       CBC:  Recent Labs  Lab 12/12/21 1708 12/12/21 1710  WBC 7.3  --   NEUTROABS 4.6  --   HGB 13.6 14.3  HCT 42.5 42.0  MCV 91.6  --   PLT 227  --    Basic Metabolic Panel:  Recent Labs  Lab 12/12/21 1708 12/12/21 1710  NA 140 141  K 3.9 3.8  CL 107 104  CO2 24  --   GLUCOSE 105* 103*  BUN 10 11  CREATININE 0.77 0.70  CALCIUM 8.7*  --    Lipid Panel:  Recent Labs  Lab 12/13/21 0523  CHOL 157  TRIG 148  HDL 41  CHOLHDL 3.8  VLDL 30  LDLCALC 86   HgbA1c:  Recent Labs  Lab 12/13/21 0523  HGBA1C 5.6   Urine Drug Screen: No results for input(s): LABOPIA, COCAINSCRNUR, LABBENZ, AMPHETMU, THCU, LABBARB in the last 168 hours.  Alcohol Level No results for input(s): ETH in the last 168 hours.  IMAGING past 24 hours MR BRAIN WO CONTRAST  Result Date: 12/13/2021 CLINICAL DATA:  66 year old female code stroke presentation yesterday. EXAM: MRI HEAD WITHOUT CONTRAST TECHNIQUE: Multiplanar, multiecho pulse sequences of the brain and  surrounding structures were obtained without intravenous contrast. COMPARISON:  CT head and CTA head and neck yesterday. FINDINGS: Brain: No restricted diffusion or evidence of acute infarction. T2 heterogeneity in the bilateral caudate nuclei, greater on the left, suggesting chronic small vessel ischemia. Occasional nonspecific cerebral white matter T2 and FLAIR hyperintense foci. No cortical encephalomalacia or chronic cerebral blood products identified. Thalami, brainstem, and cerebellum appear normal. No midline shift, mass effect, evidence of mass lesion, ventriculomegaly, extra-axial collection or acute intracranial hemorrhage. Cervicomedullary junction and pituitary are within normal limits. Vascular: Major intracranial vascular flow voids are preserved. Skull and upper cervical spine: Negative visible cervical spine. Visualized bone marrow signal is within normal limits. Sinuses/Orbits: Negative orbits. Trace paranasal sinus mucosal thickening. Other: Mastoids are clear. Visible internal auditory structures appear normal. Normal stylomastoid foramina, negative visible scalp and face. IMPRESSION: 1. No acute intracranial abnormality. 2. Signal changes in the caudate nuclei left > right suggesting chronic small vessel ischemia. Mild for age similar cerebral white matter signal changes. Electronically Signed   By: Genevie Ann M.D.   On: 12/13/2021 05:27   DG Chest Port 1 View  Result Date: 12/12/2021 CLINICAL DATA:  Shortness of breath EXAM: PORTABLE CHEST 1 VIEW COMPARISON:  02/12/2011 FINDINGS: Heart and  mediastinal contours are within normal limits. No focal opacities or effusions. No acute bony abnormality. IMPRESSION: No active disease. Electronically Signed   By: Rolm Baptise M.D.   On: 12/12/2021 18:29   CT HEAD CODE STROKE WO CONTRAST  Result Date: 12/12/2021 CLINICAL DATA:  Code stroke.  Neuro deficit, acute, stroke suspected EXAM: CT HEAD WITHOUT CONTRAST TECHNIQUE: Contiguous axial images were  obtained from the base of the skull through the vertex without intravenous contrast. RADIATION DOSE REDUCTION: This exam was performed according to the departmental dose-optimization program which includes automated exposure control, adjustment of the mA and/or kV according to patient size and/or use of iterative reconstruction technique. COMPARISON:  February 2022 FINDINGS: Brain: There is no acute intracranial hemorrhage, mass effect, or edema. Gray-white differentiation is preserved. There is no extra-axial fluid collection. Ventricles and sulci are within normal limits in size and configuration. Possible patchy low-density in the region of the left caudate head was present on the prior study. Vascular: No hyperdense vessel.There is atherosclerotic calcification at the skull base. Skull: Calvarium is unremarkable. Sinuses/Orbits: No acute finding. Other: None. ASPECTS ([place] Stroke Program Early CT Score) - Ganglionic level infarction (caudate, lentiform nuclei, internal capsule, insula, M1-M3 cortex): 7 - Supraganglionic infarction (M4-M6 cortex): 3 Total score (0-10 with 10 being normal): 10 IMPRESSION: There is no acute intracranial hemorrhage or evidence of acute infarction. ASPECT score is 10. These results were communicated to Dr. Cheral Marker at 5:18 pm on 12/12/2021 by text page via the [facility] messaging system. Electronically Signed   By: Macy Mis M.D.   On: 12/12/2021 17:20   CT ANGIO HEAD NECK W WO CM (CODE STROKE)  Result Date: 12/12/2021 CLINICAL DATA:  Neuro deficit, acute, stroke suspected EXAM: CT ANGIOGRAPHY HEAD AND NECK TECHNIQUE: Multidetector CT imaging of the head and neck was performed using the standard protocol during bolus administration of intravenous contrast. Multiplanar CT image reconstructions and MIPs were obtained to evaluate the vascular anatomy. Carotid stenosis measurements (when applicable) are obtained utilizing NASCET criteria, using the distal internal carotid diameter  as the denominator. RADIATION DOSE REDUCTION: This exam was performed according to the departmental dose-optimization program which includes automated exposure control, adjustment of the mA and/or kV according to patient size and/or use of iterative reconstruction technique. CONTRAST:  151m OMNIPAQUE IOHEXOL 350 MG/ML SOLN COMPARISON:  None Available. FINDINGS: CTA NECK Aortic arch: Minimal calcified plaque. Great vessel origins are patent. Right carotid system: Patent.  No stenosis. Left carotid system: Patent.  No stenosis. Vertebral arteries: Patent. Direct origin of the left vertebral from the aortic arch. Portions are difficult to visualize due to streak artifact from adjacent venous contrast. Right vertebral is dominant. No definite stenosis. Skeleton: Mild cervical spine degenerative changes. Other neck: Unremarkable. Upper chest: No apical lung mass. Review of the MIP images confirms the above findings CTA HEAD Anterior circulation: Intracranial internal carotid arteries are patent with mild calcified plaque. Anterior cerebral arteries are patent. Anterior communicating artery is present. Middle cerebral arteries are patent. Posterior circulation: Intracranial vertebral arteries are patent. Basilar artery is patent. Major cerebellar artery origins are patent. Right posterior communicating artery is present. Posterior cerebral arteries are patent. Venous sinuses: Patent as allowed by contrast bolus timing. Review of the MIP images confirms the above findings IMPRESSION: No large vessel occlusion, hemodynamically significant stenosis, or evidence of dissection. Electronically Signed   By: PMacy MisM.D.   On: 12/12/2021 17:54    PHYSICAL EXAM General:  Alert, well-developed, well-nourished middle-age Caucasian patient  in no acute distress Respiratory:  Regular, unlabored respirations on room air  NEURO:  Mental Status: AA&Ox3  Speech/Language: speech is without dysarthria or aphasia.  Naming,  repetition, fluency, and comprehension intact.  Cranial Nerves:  II: PERRL. Visual fields full but persistent impaired vision in right eye but no visual field defect. III, IV, VI: EOMI. Eyelids elevate symmetrically.  V: Sensation is intact to light touch and symmetrical to face.  VII: Smile is symmetrical.   VIII: hearing intact to voice. IX, X: Phonation is normal.  XII: tongue is midline without fasciculations. Motor: 5/5 strength to all muscle groups tested.  Sensation- Intact to light touch bilaterally.  Coordination: FTN intact bilaterally, Gait- deferred  NIH stroke scale 0.  ABCD2 square score 6 premorbid modified Rankin score 0  ASSESSMENT/PLAN Carol [name] is a 66 y.o. female with history of HTN, anxiety, depression and back pain presenting with sudden blurring of vision in her right eye with right lower quadrantanopia, dizziness and disorientation.  She also experienced weakness and numbness of her URE and RLE.  Dizziness, disorientation and right sided weakness and numbness resolved spontaneously, but blurred vision in the right eye persists.  It is possible that two embolic events occurred, one causing right sided TIA symptoms and the other causing right retinal artery occlusion.  Recommend follow up with ophthalmologist.  Left brain TIA and right eye blurred vision possibly from right retinal artery branch occlusion.  Strong suspicion for underlying cardioembolic source Code Stroke CT head No acute abnormality.  ASPECTS 10.    CTA head & neck no LVO, hemodynamically significant stenosis or dissection MRI  no acute abnormality, chronic small vessel changes 2D Echo EF 70-75%, normal left atrial size, normal atrial septum LDL 86 HgbA1c 5.6 VTE prophylaxis - lovenox    Diet   Diet Carb Modified Fluid consistency: Thin; Room service appropriate? Yes   No antithrombotic prior to admission, now on aspirin 81 mg daily and clopidogrel 75 mg daily for three weeks then aspirin  alone Therapy recommendations:  pending Disposition:  pending, likely home  Hypertension Home meds:  none Stable Permissive hypertension (OK if < 220/120) but gradually normalize in 5-7 days Long-term BP goal normotensive  Hyperlipidemia Home meds:  none LDL 86, goal < 70 Add atorvastatin 40 mg daily  Continue statin at discharge   Other Stroke Risk Factors Obesity, Body mass index is 37.64 kg/m., BMI >/= 30 associated with increased stroke risk, recommend weight loss, diet and exercise as appropriate   Other Active Problems Anxiety Continue home Xanax Palpitations Likely r/t anxiety but will consider 30 day cardiac monitor at discharge Hypothyroidism Continue home synthroid  Hospital day # [place] , MSN, AGACNP-BC [facility] See Amion for schedule and pager information 12/13/2021 11:28 AM   STROKE MD NOTE :  I have personally obtained history,examined this patient, reviewed notes, independently viewed imaging studies, participated in medical decision making and plan of care.ROS completed by me personally and pertinent positives fully documented  I have made any additions or clarifications directly to the above note. Agree with note above.  Patient presented with multifocal symptoms of dizziness, right eye blurred vision and right hand weakness and numbness.  Most of her symptoms have resolved except for persistent blurred vision in the right eye.  He likely has multifocal TIAs may have underwent cardiac source of embolism.  Recommend continue ongoing stroke work-up.  She may need prolonged cardiac monitoring at discharge.  Recommend aspirin and Plavix for 3 weeks followed by aspirin alone.  Patient has ABCD2 square score of 6 and is at high risk for recurrence may benefit with consideration for participation in the [facility] Stroke Prevention Study.  She will be given information to review and decide.  Greater than 50% time during this 50-minute  visit was spent in counseling and coordination of care about her high risk TIA and discussion of stroke prevention and treatment and answering questions.  Discussed with Dr. Emeline Gins. Antony Contras, MD Medical Director [facility] Pager: [phone] 12/13/2021 2:54 PM   To contact Stroke Continuity provider, please refer to [email or web address] After hours, contact General Neurology

## 2021-12-13 NOTE — Discharge Summary (Signed)
Physician Discharge Summary  Carol [name] YPP:[id number] DOB: Dec 08, 1955 DOA: 12/12/2021  PCP: Carol Bien, MD  Admit date: 12/12/2021 Discharge date: 12/13/2021  Admitted From: Home Disposition:  Home   Recommendations for Outpatient Follow-up:  Follow up with PCP in 1-2 weeks Please obtain BMP/CBC in one week Please follow up with neurology as outpatient Patient is participating in [facility] Stroke Prevention Study.  Home Health:NO  Discharge Condition:Stable CODE STATUS:FULL Diet recommendation: Heart Healthy   Brief/Interim Summary:   Carol [name] is a 66 y.o. female with medical history significant of depression, anxiety, diverticulosis, breast cancer, blood clots, diabetes, hypothyroidism presenting with shortness of breath, right-sided symptoms and blurry vision.  TIA -Suspicion for left-sided TIA -CT head, MRI brain with no acute abnormalities, chronic small vessel changes -CTA head and neck with no large vessel occlusion, or hemodynamically significant stenosis or dissection -2D echo with EF 70 to 75%, normal left atrial size, normal atrial septum, -LDL is at 86, started on statin, next-hemoglobin A1c acceptable at 5.6 -Neurology input greatly appreciated, she was not on any antithrombotic prior to admission, she will be on aspirin and Plavix for 3 weeks then aspirin alone -Discussed with cardiology, they will arrange for 30 days cardiac monitor as an outpatient -Patient will be enrolled on trial by neurology.Patient is participating in [facility] Stroke Prevention Study.  anxiety Depression -Continue with home medications  Hypothyroidism - Continue home Synthroid   Breast cancer > DCIS, status post right seed localized lumpectomy and localized excision biopsy with reexcision.  And clean margins.   Excessive sweating - Continue home Oxybutynin   Chronic back pain - Continue home oxycodone and Baclofen  Discharge Diagnoses:  Principal Problem:    TIA (transient ischemic attack) Active Problems:   Diverticulosis of colon   Major depressive disorder, recurrent episode, severe (HCC)   Ductal carcinoma in situ (DCIS) of right breast   Hypothyroidism   Anxiety   Chronic low back pain    Discharge Instructions  Discharge Instructions     Ambulatory referral to Neurology   Complete by: As directed    An appointment is requested in approximately: 4 weeks   Diet - low sodium heart healthy   Complete by: As directed    Discharge instructions   Complete by: As directed    Follow with Primary MD Carol Bien, MD in 7 days   Get CBC, CMP, checked  by Primary MD next visit.    Activity: As tolerated with Full fall precautions use walker/cane & assistance as needed   Disposition Home    Diet: Heart Healthy    On your next visit with your primary care physician please Get Medicines reviewed and adjusted.   Please request your Prim.MD to go over all Hospital Tests and Procedure/Radiological results at the follow up, please get all Hospital records sent to your Prim MD by signing hospital release before you go home.   If you experience worsening of your admission symptoms, develop shortness of breath, life threatening emergency, suicidal or homicidal thoughts you must seek medical attention immediately by calling 911 or calling your MD immediately  if symptoms less severe.  You Must read complete instructions/literature along with all the possible adverse reactions/side effects for all the Medicines you take and that have been prescribed to you. Take any new Medicines after you have completely understood and accpet all the possible adverse reactions/side effects.   Do not drive, operating heavy machinery, perform activities at heights, swimming  or participation in water activities or provide baby sitting services if your were admitted for syncope or siezures until you have seen by Primary MD or a Neurologist and advised to  do so again.  Do not drive when taking Pain medications.    Do not take more than prescribed Pain, Sleep and Anxiety Medications  Special Instructions: If you have smoked or chewed Tobacco  in the last 2 yrs please stop smoking, stop any regular Alcohol  and or any Recreational drug use.  Wear Seat belts while driving.   Please note  You were cared for by a hospitalist during your hospital stay. If you have any questions about your discharge medications or the care you received while you were in the hospital after you are discharged, you can call the unit and asked to speak with the hospitalist on call if the hospitalist that took care of you is not available. Once you are discharged, your primary care physician will handle any further medical issues. Please note that NO REFILLS for any discharge medications will be authorized once you are discharged, as it is imperative that you return to your primary care physician (or establish a relationship with a primary care physician if you do not have one) for your aftercare needs so that they can reassess your need for medications and monitor your lab values.   Increase activity slowly   Complete by: As directed       Allergies as of 12/13/2021       Reactions   Acetaminophen Nausea And Vomiting   Celebrex [celecoxib] Itching   Povidone Iodine Hives   Topical    Tape    Blisters    Codeine Rash   Doxycycline Rash        Medication List     STOP taking these medications    traZODone 50 MG tablet Commonly known as: DESYREL   venlafaxine XR 75 MG 24 hr capsule Commonly known as: EFFEXOR-XR       TAKE these medications    ALPRAZolam 0.25 MG tablet Commonly known as: XANAX Take 0.25 mg by mouth daily as needed for anxiety.   aspirin EC 81 MG tablet Take 1 tablet (81 mg total) by mouth daily. Swallow whole. Start taking on: Dec 14, 2021   atorvastatin 40 MG tablet Commonly known as: LIPITOR Take 1 tablet (40 mg total)  by mouth daily at 6 PM.   baclofen 10 MG tablet Commonly known as: LIORESAL Take 10 mg by mouth 3 (three) times daily.   Biotin 10 MG Caps Take 10 mg by mouth daily.   clopidogrel 75 MG tablet Commonly known as: PLAVIX Take 1 tablet (75 mg total) by mouth daily for 20 days. Start taking on: Dec 14, 2021   escitalopram 20 MG tablet Commonly known as: LEXAPRO Take 20 mg by mouth daily.   levothyroxine 88 MCG tablet Commonly known as: SYNTHROID Take 88 mcg by mouth daily before breakfast.   naloxone 4 MG/0.1ML Liqd nasal spray kit Commonly known as: NARCAN Place 1 spray into the nose once.   OCEANIC-STROKE asundexian or placebo 50 mg tablet Take 1 tablet (50 mg total) by mouth daily. For Investigational Use Only. Take at the same time each day (preferably in the morning). Tablet should be swallowed whole with water; it CANNOT be crushed or broken. Please contact [facility] for any questions or concerns regarding this study medication.   ondansetron 8 MG disintegrating tablet Commonly known as: ZOFRAN-ODT Take  8 mg by mouth every 8 (eight) hours as needed for vomiting or nausea.   oxybutynin 10 MG 24 hr tablet Commonly known as: DITROPAN-XL Take 10 mg by mouth at bedtime.   oxyCODONE 5 MG immediate release tablet Commonly known as: Oxy IR/ROXICODONE Take 1 tablet (5 mg total) by mouth every 6 (six) hours as needed for severe pain. What changed: when to take this   Ozempic (0.25 or 0.5 MG/DOSE) 2 MG/1.5ML Sopn Generic drug: Semaglutide(0.25 or 0.5MG/DOS) Inject 0.5 mg into the skin every Sunday.   vitamin C 1000 MG tablet Take 1,000 mg by mouth daily.   Vitamin D (Ergocalciferol) 1.25 MG (50000 UNIT) Caps capsule Commonly known as: DRISDOL Take 50,000 Units by mouth once a week.        Allergies  Allergen Reactions   Acetaminophen Nausea And Vomiting   Celebrex [Celecoxib] Itching   Povidone Iodine Hives    Topical    Tape     Blisters     Codeine Rash   Doxycycline Rash    Consultations: Neurology   Procedures/Studies: MR BRAIN WO CONTRAST  Result Date: 12/13/2021 CLINICAL DATA:  66 year old female code stroke presentation yesterday. EXAM: MRI HEAD WITHOUT CONTRAST TECHNIQUE: Multiplanar, multiecho pulse sequences of the brain and surrounding structures were obtained without intravenous contrast. COMPARISON:  CT head and CTA head and neck yesterday. FINDINGS: Brain: No restricted diffusion or evidence of acute infarction. T2 heterogeneity in the bilateral caudate nuclei, greater on the left, suggesting chronic small vessel ischemia. Occasional nonspecific cerebral white matter T2 and FLAIR hyperintense foci. No cortical encephalomalacia or chronic cerebral blood products identified. Thalami, brainstem, and cerebellum appear normal. No midline shift, mass effect, evidence of mass lesion, ventriculomegaly, extra-axial collection or acute intracranial hemorrhage. Cervicomedullary junction and pituitary are within normal limits. Vascular: Major intracranial vascular flow voids are preserved. Skull and upper cervical spine: Negative visible cervical spine. Visualized bone marrow signal is within normal limits. Sinuses/Orbits: Negative orbits. Trace paranasal sinus mucosal thickening. Other: Mastoids are clear. Visible internal auditory structures appear normal. Normal stylomastoid foramina, negative visible scalp and face. IMPRESSION: 1. No acute intracranial abnormality. 2. Signal changes in the caudate nuclei left > right suggesting chronic small vessel ischemia. Mild for age similar cerebral white matter signal changes. Electronically Signed   By: Genevie Ann M.D.   On: 12/13/2021 05:27   DG Chest Port 1 View  Result Date: 12/12/2021 CLINICAL DATA:  Shortness of breath EXAM: PORTABLE CHEST 1 VIEW COMPARISON:  02/12/2011 FINDINGS: Heart and mediastinal contours are within normal limits. No focal opacities or effusions. No acute bony  abnormality. IMPRESSION: No active disease. Electronically Signed   By: Rolm Baptise M.D.   On: 12/12/2021 18:29   US BREAST LTD UNI RIGHT INC AXILLA  Result Date: 12/07/2021 CLINICAL DATA:  History of RIGHT breast lumpectomy on 03/25/2021 for DCIS and ALH, requiring subsequent re-excision due to positive margins. Patient is also status post radiation therapy. Patient describes a lump developing at the surgical site in January. Patient describes occasional skin redness and warmth at the site. EXAM: DIGITAL DIAGNOSTIC BILATERAL MAMMOGRAM WITH TOMOSYNTHESIS AND CAD; ULTRASOUND RIGHT BREAST LIMITED TECHNIQUE: Bilateral digital diagnostic mammography and breast tomosynthesis was performed. The images were evaluated with computer-aided detection.; Targeted ultrasound examination of the right breast was performed COMPARISON:  Previous exam(s). ACR Breast Density Category c: The breast tissue is heterogeneously dense, which may obscure small masses. FINDINGS: There are expected postsurgical and post radiation changes of the RIGHT breast.  There are no new dominant masses, suspicious calcifications or secondary signs of malignancy elsewhere within the RIGHT breast. There are no new dominant masses, suspicious calcifications or secondary signs of malignancy within the LEFT breast. On physical exam, there is no current skin redness or warmth within the outer RIGHT breast corresponding to the earlier lumpectomy site. Targeted ultrasound is performed, evaluating the outer RIGHT breast corresponding to the lumpectomy site as directed by the patient, showing a small expected postsurgical seroma, measuring approximally 1.9 x 0.7 cm. There is also small/thin fluid at superficial depth underlying the lumpectomy scar at the 9 o'clock axis, measuring approximately 3 mm greatest thickness. No evidence of active infection or abscess-like collection. No suspicious solid or cystic mass. IMPRESSION: 1. No evidence of malignancy within  either breast. 2. Expected postsurgical changes and post radiation changes of the RIGHT breast. This includes a small simple-appearing postsurgical seroma at the lumpectomy site, measuring approximately 1.9 x 0.7 cm, and small/thin simple-appearing fluid at superficial depth underlying the lumpectomy scar. No evidence of active infection or abscess-like collection. RECOMMENDATION: 1. Bilateral diagnostic mammogram in 1 year. 2. Benign causes of breast pain were discussed with the patient, including postsurgical scar and post radiation change. Breast pain can be improved by over-the-counter pain medications such as Aspercreme, Voltaren, oral NSAIDs, and topical NSAIDs. Patient was instructed to return to the [facility] for additional imaging if skin redness returned, pain increased or palpable findings at the lumpectomy site increased in size or extent. I have discussed the findings and recommendations with the patient. If applicable, a reminder letter will be sent to the patient regarding the next appointment. BI-RADS CATEGORY  2: Benign. Electronically Signed   By: Franki Cabot M.D.   On: 12/07/2021 15:44  MM DIAG BREAST TOMO BILATERAL  Result Date: 12/07/2021 CLINICAL DATA:  History of RIGHT breast lumpectomy on 03/25/2021 for DCIS and ALH, requiring subsequent re-excision due to positive margins. Patient is also status post radiation therapy. Patient describes a lump developing at the surgical site in January. Patient describes occasional skin redness and warmth at the site. EXAM: DIGITAL DIAGNOSTIC BILATERAL MAMMOGRAM WITH TOMOSYNTHESIS AND CAD; ULTRASOUND RIGHT BREAST LIMITED TECHNIQUE: Bilateral digital diagnostic mammography and breast tomosynthesis was performed. The images were evaluated with computer-aided detection.; Targeted ultrasound examination of the right breast was performed COMPARISON:  Previous exam(s). ACR Breast Density Category c: The breast tissue is heterogeneously dense, which may  obscure small masses. FINDINGS: There are expected postsurgical and post radiation changes of the RIGHT breast. There are no new dominant masses, suspicious calcifications or secondary signs of malignancy elsewhere within the RIGHT breast. There are no new dominant masses, suspicious calcifications or secondary signs of malignancy within the LEFT breast. On physical exam, there is no current skin redness or warmth within the outer RIGHT breast corresponding to the earlier lumpectomy site. Targeted ultrasound is performed, evaluating the outer RIGHT breast corresponding to the lumpectomy site as directed by the patient, showing a small expected postsurgical seroma, measuring approximally 1.9 x 0.7 cm. There is also small/thin fluid at superficial depth underlying the lumpectomy scar at the 9 o'clock axis, measuring approximately 3 mm greatest thickness. No evidence of active infection or abscess-like collection. No suspicious solid or cystic mass. IMPRESSION: 1. No evidence of malignancy within either breast. 2. Expected postsurgical changes and post radiation changes of the RIGHT breast. This includes a small simple-appearing postsurgical seroma at the lumpectomy site, measuring approximately 1.9 x 0.7 cm, and small/thin simple-appearing fluid  at superficial depth underlying the lumpectomy scar. No evidence of active infection or abscess-like collection. RECOMMENDATION: 1. Bilateral diagnostic mammogram in 1 year. 2. Benign causes of breast pain were discussed with the patient, including postsurgical scar and post radiation change. Breast pain can be improved by over-the-counter pain medications such as Aspercreme, Voltaren, oral NSAIDs, and topical NSAIDs. Patient was instructed to return to the [facility] for additional imaging if skin redness returned, pain increased or palpable findings at the lumpectomy site increased in size or extent. I have discussed the findings and recommendations with the patient. If  applicable, a reminder letter will be sent to the patient regarding the next appointment. BI-RADS CATEGORY  2: Benign. Electronically Signed   By: Franki Cabot M.D.   On: 12/07/2021 15:44  ECHOCARDIOGRAM COMPLETE  Result Date: 12/13/2021    ECHOCARDIOGRAM REPORT   Patient Name:   Carol [name] Date of Exam: 12/13/2021 Medical Rec #:  [id number]    Height:       66.5 in Accession #:    [id number]   Weight:       236.8 lb Date of Birth:  11/17/1955    BSA:          2.161 m Patient Age:    65 years     BP:           96/62 mmHg Patient Gender: F            HR:           72 bpm. Exam Location:  Inpatient Procedure: 2D Echo, Cardiac Doppler and Color Doppler Indications:    Stroke I63.9  History:        Patient has prior history of Echocardiogram examinations, most                 recent 04/11/2019. Risk Factors:Diabetes. Thyroid disease.  Sonographer:    Darlina Sicilian RDCS Referring Phys: [id number] Cool  1. Left ventricular ejection fraction, by estimation, is 70 to 75%. The left ventricle has hyperdynamic function. The left ventricle has no regional wall motion abnormalities. Left ventricular diastolic parameters were normal.  2. Right ventricular systolic function is normal. The right ventricular size is normal. There is normal pulmonary artery systolic pressure.  3. The mitral valve is grossly normal. No evidence of mitral valve regurgitation. No evidence of mitral stenosis.  4. The aortic valve is tricuspid. Aortic valve regurgitation is not visualized. No aortic stenosis is present.  5. The inferior vena cava is normal in size with greater than 50% respiratory variability, suggesting right atrial pressure of 3 mmHg. Comparison(s): Compared to prior TTE in 03/2019, there is no significant change. Conclusion(s)/Recommendation(s): No intracardiac source of embolism detected on this transthoracic study. Consider a transesophageal echocardiogram to exclude cardiac source of embolism if  clinically indicated. FINDINGS  Left Ventricle: Left ventricular ejection fraction, by estimation, is 70 to 75%. The left ventricle has hyperdynamic function. The left ventricle has no regional wall motion abnormalities. The left ventricular internal cavity size was normal in size. There is no left ventricular hypertrophy. Left ventricular diastolic parameters were normal. Right Ventricle: The right ventricular size is normal. Right vetricular wall thickness was not well visualized. Right ventricular systolic function is normal. There is normal pulmonary artery systolic pressure. The tricuspid regurgitant velocity is 2.21 m/s, and with an assumed right atrial pressure of 3 mmHg, the estimated right ventricular systolic pressure is 00.8 mmHg. Left Atrium: Left atrial size was normal  in size. Right Atrium: Right atrial size was normal in size. Pericardium: There is no evidence of pericardial effusion. Mitral Valve: The mitral valve is grossly normal. There is mild thickening of the mitral valve leaflet(s). There is mild calcification of the mitral valve leaflet(s). No evidence of mitral valve regurgitation. No evidence of mitral valve stenosis. Tricuspid Valve: The tricuspid valve is normal in structure. Tricuspid valve regurgitation is trivial. Aortic Valve: The aortic valve is tricuspid. Aortic valve regurgitation is not visualized. No aortic stenosis is present. Pulmonic Valve: The pulmonic valve was not well visualized. Aorta: The aortic root is normal in size and structure. Venous: The inferior vena cava is normal in size with greater than 50% respiratory variability, suggesting right atrial pressure of 3 mmHg. IAS/Shunts: The atrial septum is grossly normal.  LEFT VENTRICLE PLAX 2D LVIDd:         4.80 cm   Diastology LVIDs:         2.80 cm   LV e' medial:    10.40 cm/s LV PW:         0.90 cm   LV E/e' medial:  8.1 LV IVS:        0.70 cm   LV e' lateral:   12.60 cm/s LVOT diam:     1.90 cm   LV E/e' lateral: 6.7  LV SV:         61 LV SV Index:   28 LVOT Area:     2.84 cm  RIGHT VENTRICLE TAPSE (M-mode): 1.9 cm LEFT ATRIUM             Index        RIGHT ATRIUM           Index LA diam:        3.70 cm 1.71 cm/m   RA Area:     15.90 cm LA Vol (A2C):   40.7 ml 18.84 ml/m  RA Volume:   41.30 ml  19.12 ml/m LA Vol (A4C):   48.4 ml 22.40 ml/m LA Biplane Vol: 44.9 ml 20.78 ml/m  AORTIC VALVE LVOT Vmax:   111.00 cm/s LVOT Vmean:  73.200 cm/s LVOT VTI:    0.215 m  AORTA Ao Root diam: 3.10 cm Ao Asc diam:  3.50 cm MITRAL VALVE               TRICUSPID VALVE MV Area (PHT): 3.65 cm    TR Peak grad:   19.5 mmHg MV Decel Time: 208 msec    TR Vmax:        221.00 cm/s MV E velocity: 83.80 cm/s MV A velocity: 59.20 cm/s  SHUNTS MV E/A ratio:  1.42        Systemic VTI:  0.22 m                            Systemic Diam: 1.90 cm Gwyndolyn Kaufman MD Electronically signed by Gwyndolyn Kaufman MD Signature Date/Time: 12/13/2021/11:18:07 AM    Final    CT HEAD CODE STROKE WO CONTRAST  Result Date: 12/12/2021 CLINICAL DATA:  Code stroke.  Neuro deficit, acute, stroke suspected EXAM: CT HEAD WITHOUT CONTRAST TECHNIQUE: Contiguous axial images were obtained from the base of the skull through the vertex without intravenous contrast. RADIATION DOSE REDUCTION: This exam was performed according to the departmental dose-optimization program which includes automated exposure control, adjustment of the mA and/or kV according to patient size and/or use of iterative reconstruction technique.  COMPARISON:  February 2022 FINDINGS: Brain: There is no acute intracranial hemorrhage, mass effect, or edema. Gray-white differentiation is preserved. There is no extra-axial fluid collection. Ventricles and sulci are within normal limits in size and configuration. Possible patchy low-density in the region of the left caudate head was present on the prior study. Vascular: No hyperdense vessel.There is atherosclerotic calcification at the skull base. Skull:  Calvarium is unremarkable. Sinuses/Orbits: No acute finding. Other: None. ASPECTS ([place] Stroke Program Early CT Score) - Ganglionic level infarction (caudate, lentiform nuclei, internal capsule, insula, M1-M3 cortex): 7 - Supraganglionic infarction (M4-M6 cortex): 3 Total score (0-10 with 10 being normal): 10 IMPRESSION: There is no acute intracranial hemorrhage or evidence of acute infarction. ASPECT score is 10. These results were communicated to Dr. Cheral Marker at 5:18 pm on 12/12/2021 by text page via the [facility] messaging system. Electronically Signed   By: Macy Mis M.D.   On: 12/12/2021 17:20   CT ANGIO HEAD NECK W WO CM (CODE STROKE)  Result Date: 12/12/2021 CLINICAL DATA:  Neuro deficit, acute, stroke suspected EXAM: CT ANGIOGRAPHY HEAD AND NECK TECHNIQUE: Multidetector CT imaging of the head and neck was performed using the standard protocol during bolus administration of intravenous contrast. Multiplanar CT image reconstructions and MIPs were obtained to evaluate the vascular anatomy. Carotid stenosis measurements (when applicable) are obtained utilizing NASCET criteria, using the distal internal carotid diameter as the denominator. RADIATION DOSE REDUCTION: This exam was performed according to the departmental dose-optimization program which includes automated exposure control, adjustment of the mA and/or kV according to patient size and/or use of iterative reconstruction technique. CONTRAST:  141m OMNIPAQUE IOHEXOL 350 MG/ML SOLN COMPARISON:  None Available. FINDINGS: CTA NECK Aortic arch: Minimal calcified plaque. Great vessel origins are patent. Right carotid system: Patent.  No stenosis. Left carotid system: Patent.  No stenosis. Vertebral arteries: Patent. Direct origin of the left vertebral from the aortic arch. Portions are difficult to visualize due to streak artifact from adjacent venous contrast. Right vertebral is dominant. No definite stenosis. Skeleton: Mild cervical spine degenerative  changes. Other neck: Unremarkable. Upper chest: No apical lung mass. Review of the MIP images confirms the above findings CTA HEAD Anterior circulation: Intracranial internal carotid arteries are patent with mild calcified plaque. Anterior cerebral arteries are patent. Anterior communicating artery is present. Middle cerebral arteries are patent. Posterior circulation: Intracranial vertebral arteries are patent. Basilar artery is patent. Major cerebellar artery origins are patent. Right posterior communicating artery is present. Posterior cerebral arteries are patent. Venous sinuses: Patent as allowed by contrast bolus timing. Review of the MIP images confirms the above findings IMPRESSION: No large vessel occlusion, hemodynamically significant stenosis, or evidence of dissection. Electronically Signed   By: PMacy MisM.D.   On: 12/12/2021 17:54      Subjective: Denies any new focal deficits, no chest pain, no shortness of breath, she does report some mild residual right eye blurry vision and right facial numbness.  Discharge Exam: Vitals:   12/13/21 1320 12/13/21 1525  BP: 128/69   Pulse: 78 74  Resp: 17   Temp: 98 F (36.7 C) 98.2 F (36.8 C)  SpO2: 92% 96%   Vitals:   12/13/21 0900 12/13/21 1200 12/13/21 1320 12/13/21 1525  BP: 96/62 (!) 101/57 128/69   Pulse: 72 80 78 74  Resp: '15 14 17   ' Temp:   98 F (36.7 C) 98.2 F (36.8 C)  TempSrc:   Oral Oral  SpO2: 93% 97% 92% 96%  Weight:  General: Pt is alert, awake, not in acute distress Cardiovascular: RRR, S1/S2 +, no rubs, no gallops Respiratory: CTA bilaterally, no wheezing, no rhonchi Abdominal: Soft, NT, ND, bowel sounds + Extremities: no edema, no cyanosis    The results of significant diagnostics from this hospitalization (including imaging, microbiology, ancillary and laboratory) are listed below for reference.     Microbiology: No results found for this or any previous visit (from the past 240 hour(s)).    Labs: BNP (last 3 results) No results for input(s): BNP in the last 8760 hours. Basic Metabolic Panel: Recent Labs  Lab 12/12/21 1708 12/12/21 1710  NA 140 141  K 3.9 3.8  CL 107 104  CO2 24  --   GLUCOSE 105* 103*  BUN 10 11  CREATININE 0.77 0.70  CALCIUM 8.7*  --    Liver Function Tests: Recent Labs  Lab 12/12/21 1708  AST 19  ALT 13  ALKPHOS 86  BILITOT 0.4  PROT 6.5  ALBUMIN 3.2*   No results for input(s): LIPASE, AMYLASE in the last 168 hours. No results for input(s): AMMONIA in the last 168 hours. CBC: Recent Labs  Lab 12/12/21 1708 12/12/21 1710  WBC 7.3  --   NEUTROABS 4.6  --   HGB 13.6 14.3  HCT 42.5 42.0  MCV 91.6  --   PLT 227  --    Cardiac Enzymes: No results for input(s): CKTOTAL, CKMB, CKMBINDEX, TROPONINI in the last 168 hours. BNP: Invalid input(s): POCBNP CBG: Recent Labs  Lab 12/12/21 2324 12/13/21 0801 12/13/21 1218 12/13/21 1525  GLUCAP 100* 124* 74 119*   D-Dimer No results for input(s): DDIMER in the last 72 hours. Hgb A1c Recent Labs    12/13/21 0523  HGBA1C 5.6   Lipid Profile Recent Labs    12/13/21 0523  CHOL 157  HDL 41  LDLCALC 86  TRIG 148  CHOLHDL 3.8   Thyroid function studies No results for input(s): TSH, T4TOTAL, T3FREE, THYROIDAB in the last 72 hours.  Invalid input(s): FREET3 Anemia work up No results for input(s): VITAMINB12, FOLATE, FERRITIN, TIBC, IRON, RETICCTPCT in the last 72 hours. Urinalysis    Component Value Date/Time   COLORURINE YELLOW 02/11/2016 1729   APPEARANCEUR CLOUDY (A) 02/11/2016 1729   LABSPEC 1.020 02/11/2016 1729   PHURINE 5.5 02/11/2016 1729   GLUCOSEU NEGATIVE 02/11/2016 1729   HGBUR NEGATIVE 02/11/2016 1729   BILIRUBINUR NEGATIVE 02/11/2016 1729   KETONESUR NEGATIVE 02/11/2016 1729   PROTEINUR NEGATIVE 02/11/2016 1729   NITRITE NEGATIVE 02/11/2016 1729   LEUKOCYTESUR NEGATIVE 02/11/2016 1729   Sepsis Labs Invalid input(s): PROCALCITONIN,  WBC,   LACTICIDVEN Microbiology No results found for this or any previous visit (from the past 240 hour(s)).   Time coordinating discharge: Over 30 minutes  SIGNED:   Phillips Climes, MD  [facility] 12/13/2021, 4:37 PM Pager   If 7PM-7AM, please contact night-coverage [email or web address] Password [email or web address]

## 2021-12-13 NOTE — Evaluation (Signed)
Occupational Therapy Evaluation Patient Details Name: Carol [name] MRN: [id number] DOB: 17-Dec-1955 Today's Date: 12/13/2021   History of Present Illness 66 yo female presenting to ED on 5/21 with R sided weakness and R eye blurry vision. MRI showing no acute changes as well as signal changes in the caudate nuclei left > right suggesting chronic small vessel ischemia. PMH including depression, anxiety, diverticulosis, breast cancer, blood clots, diabetes, hypothyroidism.   Clinical Impression   PTA, pt was living with her husband and daughter and was independent with ADLs and IADLs. Pt currently performing at Mod I level for ADLs and functional mobility; presenting near baseline function. Continues to report blurry vision at R eye; discussed follow up with her eye doctor at dc. Provided education on BEFAST for stroke signs and symptoms; pt verbalized understanding. Recommend dc to home once medically stable per physician. All acute OT needs have been met and will sign off.     Recommendations for follow up therapy are one component of a multi-disciplinary discharge planning process, led by the attending physician.  Recommendations may be updated based on patient status, additional functional criteria and insurance authorization.   Follow Up Recommendations  No OT follow up    Assistance Recommended at Discharge PRN  Patient can return home with the following Assistance with cooking/housework;Assist for transportation    Functional Status Assessment  Patient has not had a recent decline in their functional status  Equipment Recommendations  None recommended by OT    Recommendations for Other Services       Precautions / Restrictions Precautions Precautions: Fall Precaution Comments: Reports that she "needs a knee replacement" on the L knee. Wears a brace at home. Had one fall last december because her knee buckled Restrictions Weight Bearing Restrictions: No      Mobility Bed  Mobility Overal bed mobility: Modified Independent             General bed mobility comments: Increased time and effort with L knee (baseline pain)    Transfers Overall transfer level: Modified independent                 General transfer comment: increased time      Balance                                           ADL either performed or assessed with clinical judgement   ADL Overall ADL's : Modified independent                                       General ADL Comments: Mod I level with increased time. reports this is near baseline. Donned socks. Performed oral care.     Vision Baseline Vision/History:  (Wears contacts) Vision Assessment?: Yes Eye Alignment: Within Functional Limits Ocular Range of Motion: Within Functional Limits Alignment/Gaze Preference: Within Defined Limits Tracking/Visual Pursuits: Able to track stimulus in all quads without difficulty Convergence: Impaired (comment) (Slight decrease in convergence) Visual Fields: No apparent deficits Additional Comments: Reports blurry vision in R eye only. No difficulty with tracking. Discussed follow up with eye doctor at dc.     Perception     Praxis      Pertinent Vitals/Pain Pain Assessment Pain Assessment: Faces Faces Pain Scale: Hurts little more Pain Location: Back -  chronic Pain Descriptors / Indicators: Discomfort Pain Intervention(s): Monitored during session, Repositioned     Hand Dominance Right   Extremity/Trunk Assessment Upper Extremity Assessment Upper Extremity Assessment: Overall WFL for tasks assessed;RUE deficits/detail RUE Deficits / Details: Slight weakness with grasp - however, with challenge able to adjust and demonstrate more strength. Demonstrating WFL for functional tasks such as oral care. No difficulty manipulating or holding tooth brush or tooth paste. Performed finger to nose and opposition WFL.   Lower Extremity  Assessment Lower Extremity Assessment: Overall WFL for tasks assessed       Communication Communication Communication: No difficulties   Cognition Arousal/Alertness: Awake/alert Behavior During Therapy: WFL for tasks assessed/performed Overall Cognitive Status: Within Functional Limits for tasks assessed                                       General Comments  Educated on BEFAST; pt verbalized understanding. Supine 104/61, EOB 108/70, standing 113/59, sitting EOB 125/59. HR 80s. SpO2 97% on RA.    Exercises     Shoulder Instructions      Home Living Family/patient expects to be discharged to:: Private residence Living Arrangements: Spouse/significant other;Children Available Help at Discharge: Family;Available PRN/intermittently Type of Home: House Home Access: Stairs to enter (3 steps zt side entrance) Entrance Stairs-Number of Steps: 3 Entrance Stairs-Rails: Right;Left       Bathroom Shower/Tub: Door;Other (comment) (walk in , door opens from left)   Bathroom Toilet: Standard Bathroom Accessibility: Yes   Home Equipment: BSC/3in1;Rolling Walker (2 wheels);Rollator (4 wheels) (knee scooter)          Prior Functioning/Environment Prior Level of Function : Independent/Modified Independent;Driving               ADLs Comments: Reports she was independent with ADLs, IADLs, and driving.  Enjoys spending time with her family        OT Problem List: Decreased range of motion;Decreased knowledge of precautions;Decreased knowledge of use of DME or AE      OT Treatment/Interventions:      OT Goals(Current goals can be found in the care plan section) Acute Rehab OT Goals Patient Stated Goal: Go home OT Goal Formulation: All assessment and education complete, DC therapy  OT Frequency:      Co-evaluation              AM-PAC OT "6 Clicks" Daily Activity     Outcome Measure Help from another person eating meals?: None Help from another  person taking care of personal grooming?: None Help from another person toileting, which includes using toliet, bedpan, or urinal?: None Help from another person bathing (including washing, rinsing, drying)?: None Help from another person to put on and taking off regular upper body clothing?: None Help from another person to put on and taking off regular lower body clothing?: None 6 Click Score: 24   End of Session Nurse Communication: Mobility status;Other (comment) (SpO2 stable on RA)  Activity Tolerance: Patient tolerated treatment well Patient left: in bed;with call bell/phone within reach (with MD)  OT Visit Diagnosis: Muscle weakness (generalized) (M62.81);Other abnormalities of gait and mobility (R26.89)                Time: 6578-4696 OT Time Calculation (min): 25 min Charges:  OT General Charges $OT Visit: 1 Visit OT Evaluation $OT Eval Low Complexity: 1 Low OT Treatments $Self Care/Home Management : 8-22 mins  [place], OTR/L Acute Rehab Pager: [phone] Office: [place] 12/13/2021, 11:05 AM

## 2021-12-13 NOTE — Progress Notes (Signed)
SLP Cancellation Note  Patient Details Name: Carol [name] MRN: [id number] DOB: 1956/07/19   Cancelled treatment:       Reason Eval/Treat Not Completed: SLP screened, no needs identified, will sign off   Juan Quam Laurice 12/13/2021, 4:04 PM

## 2021-12-13 NOTE — ED Notes (Signed)
Patient transported to MRI 

## 2021-12-16 DIAGNOSIS — Z09 Encounter for follow-up examination after completed treatment for conditions other than malignant neoplasm: Secondary | ICD-10-CM | POA: Diagnosis not present

## 2021-12-16 DIAGNOSIS — F411 Generalized anxiety disorder: Secondary | ICD-10-CM | POA: Diagnosis not present

## 2021-12-16 DIAGNOSIS — G459 Transient cerebral ischemic attack, unspecified: Secondary | ICD-10-CM | POA: Diagnosis not present

## 2021-12-17 DIAGNOSIS — N390 Urinary tract infection, site not specified: Secondary | ICD-10-CM | POA: Diagnosis not present

## 2021-12-17 DIAGNOSIS — M25512 Pain in left shoulder: Secondary | ICD-10-CM | POA: Diagnosis not present

## 2021-12-17 DIAGNOSIS — M25562 Pain in left knee: Secondary | ICD-10-CM | POA: Diagnosis not present

## 2021-12-17 DIAGNOSIS — G459 Transient cerebral ischemic attack, unspecified: Secondary | ICD-10-CM | POA: Diagnosis not present

## 2021-12-17 DIAGNOSIS — I10 Essential (primary) hypertension: Secondary | ICD-10-CM | POA: Diagnosis not present

## 2021-12-21 DIAGNOSIS — F331 Major depressive disorder, recurrent, moderate: Secondary | ICD-10-CM | POA: Diagnosis not present

## 2021-12-21 DIAGNOSIS — G4700 Insomnia, unspecified: Secondary | ICD-10-CM | POA: Diagnosis not present

## 2021-12-21 DIAGNOSIS — F411 Generalized anxiety disorder: Secondary | ICD-10-CM | POA: Diagnosis not present

## 2021-12-21 DIAGNOSIS — J069 Acute upper respiratory infection, unspecified: Secondary | ICD-10-CM | POA: Diagnosis not present

## 2021-12-21 DIAGNOSIS — I10 Essential (primary) hypertension: Secondary | ICD-10-CM | POA: Diagnosis not present

## 2021-12-21 DIAGNOSIS — G459 Transient cerebral ischemic attack, unspecified: Secondary | ICD-10-CM | POA: Diagnosis not present

## 2021-12-26 ENCOUNTER — Ambulatory Visit (INDEPENDENT_AMBULATORY_CARE_PROVIDER_SITE_OTHER): Payer: Medicare HMO

## 2021-12-26 DIAGNOSIS — R42 Dizziness and giddiness: Secondary | ICD-10-CM

## 2021-12-26 DIAGNOSIS — G459 Transient cerebral ischemic attack, unspecified: Secondary | ICD-10-CM | POA: Diagnosis not present

## 2022-01-03 ENCOUNTER — Telehealth: Payer: Self-pay | Admitting: Cardiology

## 2022-01-03 NOTE — Telephone Encounter (Signed)
Patient will remove monitor.  [employer] has been contacted to ship patient Hydrocolloid strips to try.  Instructed patient to place Hydrocolloid strip in horizontal position on upper left chest until skin in vertical position has healed.  Patient will contact us if she has issues with Hydrocolloid strips.  If so we will progress to a bridge with sensitive skin electrodes.

## 2022-01-03 NOTE — Telephone Encounter (Signed)
Patient is called because she has an event monitor on for 30 days, she had only been wearing it for a week. She states when she went to change the tape her skin has broken out with a rash it's itchy and it red.  She wants to know if she should go ahead and apply the other adhesive to it.

## 2022-01-04 DIAGNOSIS — F411 Generalized anxiety disorder: Secondary | ICD-10-CM | POA: Diagnosis not present

## 2022-01-04 DIAGNOSIS — R519 Headache, unspecified: Secondary | ICD-10-CM | POA: Diagnosis not present

## 2022-01-20 ENCOUNTER — Ambulatory Visit: Payer: Medicare HMO | Admitting: Cardiology

## 2022-01-24 DIAGNOSIS — M47896 Other spondylosis, lumbar region: Secondary | ICD-10-CM | POA: Diagnosis not present

## 2022-01-24 DIAGNOSIS — M5136 Other intervertebral disc degeneration, lumbar region: Secondary | ICD-10-CM | POA: Diagnosis not present

## 2022-01-24 DIAGNOSIS — Z79891 Long term (current) use of opiate analgesic: Secondary | ICD-10-CM | POA: Diagnosis not present

## 2022-01-24 DIAGNOSIS — M961 Postlaminectomy syndrome, not elsewhere classified: Secondary | ICD-10-CM | POA: Diagnosis not present

## 2022-01-24 DIAGNOSIS — M5416 Radiculopathy, lumbar region: Secondary | ICD-10-CM | POA: Diagnosis not present

## 2022-01-24 DIAGNOSIS — Z5181 Encounter for therapeutic drug level monitoring: Secondary | ICD-10-CM | POA: Diagnosis not present

## 2022-01-27 ENCOUNTER — Telehealth: Payer: Self-pay | Admitting: Neurology

## 2022-01-27 NOTE — Telephone Encounter (Signed)
LVM and mychart msg informing pt of need to r/s 8/7 appt - MD out

## 2022-02-02 DIAGNOSIS — E559 Vitamin D deficiency, unspecified: Secondary | ICD-10-CM | POA: Diagnosis not present

## 2022-02-02 DIAGNOSIS — F411 Generalized anxiety disorder: Secondary | ICD-10-CM | POA: Diagnosis not present

## 2022-02-02 DIAGNOSIS — F331 Major depressive disorder, recurrent, moderate: Secondary | ICD-10-CM | POA: Diagnosis not present

## 2022-02-02 DIAGNOSIS — L659 Nonscarring hair loss, unspecified: Secondary | ICD-10-CM | POA: Diagnosis not present

## 2022-02-02 DIAGNOSIS — G4700 Insomnia, unspecified: Secondary | ICD-10-CM | POA: Diagnosis not present

## 2022-02-09 ENCOUNTER — Telehealth: Payer: Self-pay | Admitting: Neurology

## 2022-02-09 NOTE — Telephone Encounter (Signed)
Sent mychart msg offering 8/21 appt with Dr. Leonie Man

## 2022-02-14 ENCOUNTER — Encounter: Payer: Self-pay | Admitting: Cardiology

## 2022-02-14 ENCOUNTER — Ambulatory Visit: Payer: Medicare HMO | Admitting: Cardiology

## 2022-02-14 VITALS — BP 120/80 | HR 74 | Ht 66.5 in | Wt 238.0 lb

## 2022-02-14 DIAGNOSIS — Z136 Encounter for screening for cardiovascular disorders: Secondary | ICD-10-CM | POA: Diagnosis not present

## 2022-02-14 DIAGNOSIS — G459 Transient cerebral ischemic attack, unspecified: Secondary | ICD-10-CM

## 2022-02-14 DIAGNOSIS — R002 Palpitations: Secondary | ICD-10-CM

## 2022-02-14 NOTE — Patient Instructions (Signed)
Medication Instructions:  The current medical regimen is effective;  continue present plan and medications.  *If you need a refill on your cardiac medications before your next appointment, please call your pharmacy*  Testing/Procedures: Your physician has requested that you have a Coronary Calcium score which is completed by CT. Cardiac computed tomography (CT) is a painless test that uses an x-ray machine to take clear, detailed pictures of your heart. There are no instructions for this testing.  You may eat/drink and take your normal medications this day.  The cost of the testing is $99 due at the time of your appointment.  Follow-Up: At [facility], you and your health needs are our priority.  As part of our continuing mission to provide you with exceptional heart care, we have created designated Provider Care Teams.  These Care Teams include your primary Cardiologist (physician) and Advanced Practice Providers (APPs -  Physician Assistants and Nurse Practitioners) who all work together to provide you with the care you need, when you need it.  We recommend signing up for the patient portal called "MyChart".  Sign up information is provided on this After Visit Summary.  MyChart is used to connect with patients for Virtual Visits (Telemedicine).  Patients are able to view lab/test results, encounter notes, upcoming appointments, etc.  Non-urgent messages can be sent to your provider as well.   To learn more about what you can do with MyChart, go to [email or web address].    Your next appointment:   Follow up as needed after the above testing.   Important Information About Sugar

## 2022-02-14 NOTE — Progress Notes (Signed)
Cardiology Office Note:    Date:  02/14/2022   ID:  Carol [name], DOB 05/07/1956, MRN [id number]  PCP:  [name], MD   [facility] Providers Cardiologist:  None     Referring MD: [name], MD    History of Present Illness:    GISSELL [name] is a 66 y.o. female here for evaluation of palpitations post TIA at the request of Dr. Ernie Hew  Suspicion for left-sided TIA -CT head, MRI brain with no acute abnormalities, chronic small vessel changes -CTA head and neck with no large vessel occlusion, or hemodynamically significant stenosis or dissection -2D echo with EF 70 to 75%, normal left atrial size, normal atrial septum, -LDL is at 86, started on statin, next-hemoglobin A1c acceptable at 5.6  Father had MI  She will feel occasional skipped beat.  No chest pain.  No shortness of breath.  No specific triggers.   Past Medical History:  Diagnosis Date   Anxiety    Arthritis    Balance problems    Chronic anxiety    Chronic depression    Chronic diarrhea    after gallbladder surgery   Chronic low back pain    Chronic seasonal allergic rhinitis    Complication of anesthesia    pt states has difficulty with being put to sleep    Exogenous obesity    EXTERNAL HEMORRHOIDS 06/26/2007   Annotation: and internal Qualifier: Diagnosis of  By: Garen Grams     Falls    Family history of adverse reaction to anesthesia    pts daughter has N&V   Family history of breast cancer    Family history of prostate cancer    Fatigue    Hemorrhoids    History of blood clots    History of blood transfusion    2006   History of bronchitis    History of chicken pox    History of frequent urinary tract infections    History of gallstones    History of jaundice    History of kidney stones    History of measles as a child    History of mumps as a child    Hypothyroidism    Insomnia    Night sweats    Personal history of radiation therapy    Pneumonia    hx of     Polycythemia    PONV (postoperative nausea and vomiting)    Poor sleep    chronic poor quality sleep wit snoring, sleep study in Nov 2013 showed mild central sleep apnea with hypoxia felt due to narcotics, no significant OSA or RLS   Thyroid disease    Tinnitus    Type II diabetes mellitus ([place]) 11/2016    Past Surgical History:  Procedure Laterality Date   ABDOMINAL HYSTERECTOMY     APPENDECTOMY  1972   BACK SURGERY     BREAST BIOPSY Right    years ago- unsure when   BREAST LUMPECTOMY     BREAST LUMPECTOMY WITH RADIOACTIVE SEED LOCALIZATION Right 03/25/2021   Procedure: RIGHT BREAST LUMPECTOMY WITH RADIOACTIVE SEED LOCALIZATION X2;  Surgeon: Stark Klein, MD;  Location: [facility];  Service: General;  Laterality: Right;   CHOLECYSTECTOMY  1987   COLONOSCOPY  11/2011   showed mild diverticulosis, internal hemorrhoids, and no polyps   ENDOSCOPY with esophageal dilatation  03/14/2016   KNEE ARTHROSCOPY Right 01/07/2016   Procedure: ARTHROSCOPY RIGHT KNEE, DEBRIDEMENT OF ARTHROFIBROSIS, AND EXAM UNDER ANESTHESIA;  Surgeon: Dellis Filbert  Tonita Cong, MD;  Location: [facility];  Service: Orthopedics;  Laterality: Right;   KNEE ARTHROSCOPY Right 10/04/2017   Procedure: Right knee arthroscopy, evaluation under anesthesia, lysis of adhesions;  Surgeon: Susa Day, MD;  Location: [facility];  Service: Orthopedics;  Laterality: Right;  60 mins   KNEE SURGERY     L3/4  fusion  2004   L3/4 discectomy  2003   L4/5 discectomy  2001   L4/5 fusion  2002   left foot surgery      to repair break - 2002   left knee meniscectomy  2012   lumbar spine ESI  12/2017   menicus tear     bilat;    RE-EXCISION OF BREAST LUMPECTOMY Right 04/29/2021   Procedure: RE-EXCISION OF RIGHT BREAST LUMPECTOMY;  Surgeon: Stark Klein, MD;  Location: [place];  Service: General;  Laterality: Right;   right knee arthroscopy  12/2015   Dr. Tonita Cong   right knee meniscectomy  2012   TONSILLECTOMY     TOTAL KNEE ARTHROPLASTY Right 03/27/2015    Procedure: RIGHT TOTAL KNEE ARTHROPLASTY;  Surgeon: Susa Day, MD;  Location: [facility];  Service: Orthopedics;  Laterality: Right;   total knee replacement Right 03/2015   VAGINAL HYSTERECTOMY  1996   with ovaries intact, accidental bladder laceration   WRIST SURGERY  11/2016   Dr. Amedeo Plenty    Current Medications: Current Meds  Medication Sig   ALPRAZolam (XANAX) 0.25 MG tablet Take 0.25 mg by mouth daily as needed for anxiety.   aspirin EC 81 MG tablet Take 1 tablet (81 mg total) by mouth daily. Swallow whole.   baclofen (LIORESAL) 10 MG tablet Take 10 mg by mouth 3 (three) times daily.   Biotin 10 MG CAPS Take 10 mg by mouth daily.   escitalopram (LEXAPRO) 20 MG tablet Take 20 mg by mouth daily.   levothyroxine (SYNTHROID) 88 MCG tablet Take 88 mcg by mouth daily before breakfast.   naloxone (NARCAN) nasal spray 4 mg/0.1 mL Place 1 spray into the nose once.   ondansetron (ZOFRAN-ODT) 8 MG disintegrating tablet Take 8 mg by mouth every 8 (eight) hours as needed for vomiting or nausea.   oxybutynin (DITROPAN-XL) 10 MG 24 hr tablet Take 10 mg by mouth at bedtime.   oxyCODONE (OXY IR/ROXICODONE) 5 MG immediate release tablet Take 1 tablet (5 mg total) by mouth every 6 (six) hours as needed for severe pain.   Semaglutide,0.25 or 0.'5MG'$ /DOS, (OZEMPIC, 0.25 OR 0.5 MG/DOSE,) 2 MG/1.5ML SOPN Inject 0.5 mg into the skin every Sunday.   Vitamin D, Ergocalciferol, (DRISDOL) 1.25 MG (50000 UNIT) CAPS capsule Take 50,000 Units by mouth once a week.     Allergies:   Acetaminophen, Celebrex [celecoxib], Povidone iodine, Tape, Codeine, and Doxycycline   Social History   Socioeconomic History   Marital status: Married    Spouse name: Not on file   Number of children: Not on file   Years of education: Not on file   Highest education level: Not on file  Occupational History   Not on file  Tobacco Use   Smoking status: Never   Smokeless tobacco: Never  Vaping Use   Vaping Use: Never used   Substance and Sexual Activity   Alcohol use: No   Drug use: No   Sexual activity: Yes    Birth control/protection: None, Surgical  Other Topics Concern   Not on file  Social History Narrative   Not on file   Social Determinants of Health  Financial Resource Strain: Low Risk  (05/20/2021)   Overall Financial Resource Strain (CARDIA)    Difficulty of Paying Living Expenses: Not very hard  Food Insecurity: No Food Insecurity (05/20/2021)   Hunger Vital Sign    Worried About Running Out of Food in the Last Year: Never true    Ran Out of Food in the Last Year: Never true  Transportation Needs: No Transportation Needs (05/20/2021)   PRAPARE - Hydrologist (Medical): No    Lack of Transportation (Non-Medical): No  Physical Activity: Not on file  Stress: Stress Concern Present (05/20/2021)   [place]    Feeling of Stress : Very much  Social Connections: Moderately Integrated (05/20/2021)   Social Connection and Isolation Panel [NHANES]    Frequency of Communication with Friends and Family: More than three times a week    Frequency of Social Gatherings with Friends and Family: More than three times a week    Attends Religious Services: 1 to 4 times per year    Active Member of [employer] or Organizations: No    Attends Music therapist: Never    Marital Status: Married     Family History: The patient's family history includes Breast cancer (age of onset: 84) in an other family member; COPD in her mother; Depression in her daughter; Diabetes in her mother; Diabetes type II in an other family member; Heart attack (age of onset: 71) in her father; Hypertension in her mother; Myasthenia gravis in her daughter; Other in her son and another family member; Prostate cancer in an other family member; Prostate cancer (age of onset: 41) in her maternal uncle.  ROS:   Please see the history  of present illness.     All other systems reviewed and are negative.  EKGs/Labs/Other Studies Reviewed:    The following studies were reviewed today: As above, hospital records reviewed  Prior EKG shows sinus rhythm  Recent Labs: 12/12/2021: ALT 13; BUN 11; Creatinine, Ser 0.70; Hemoglobin 14.3; Platelets 227; Potassium 3.8; Sodium 141  Recent Lipid Panel    Component Value Date/Time   CHOL 157 12/13/2021 0523   TRIG 148 12/13/2021 0523   HDL 41 12/13/2021 0523   CHOLHDL 3.8 12/13/2021 0523   VLDL 30 12/13/2021 0523   LDLCALC 86 12/13/2021 0523     Risk Assessment/Calculations:              Physical Exam:    VS:  BP 120/80 (BP Location: Left Arm, Patient Position: Sitting, Cuff Size: Normal)   Pulse 74   Ht 5' 6.5" (1.689 m)   Wt 238 lb (108 kg)   SpO2 97%   BMI 37.84 kg/m     Wt Readings from Last 3 Encounters:  02/14/22 238 lb (108 kg)  12/12/21 236 lb 12.4 oz (107.4 kg)  08/04/21 226 lb 6.4 oz (102.7 kg)     GEN:  Well nourished, well developed in no acute distress HEENT: Normal NECK: No JVD; No carotid bruits LYMPHATICS: No lymphadenopathy CARDIAC: RRR, no murmurs, no rubs, gallops, former Linq scar RESPIRATORY:  Clear to auscultation without rales, wheezing or rhonchi  ABDOMEN: Soft, non-tender, non-distended MUSCULOSKELETAL:  No edema; No deformity  SKIN: Warm and dry NEUROLOGIC:  Alert and oriented x 3 PSYCHIATRIC:  Normal affect   ASSESSMENT:    1. Encounter for screening for coronary artery disease   2. TIA (transient ischemic attack)    PLAN:  In order of problems listed above:    Palpitations - PACs PVCs noted on event monitor.  No evidence of atrial fibrillation.  A few years ago she had an implantable loop recorder.  No adverse arrhythmias detected then either.  She showed me scar on chest. -Conservative management reasonable.  TIA - 30-day event monitor showed no atrial fibrillation.  Family history of CAD - Father massive  MI.  We will check a coronary calcium score.  If abnormal, we will place her back on atorvastatin 40 mg.  We will follow-up with results of study.  Preop knee surgery - If necessary, she may proceed with knee surgery with low overall cardiac risk.  Prior ejection fraction normal.  No anginal or high risk symptoms.        Medication Adjustments/Labs and Tests Ordered: Current medicines are reviewed at length with the patient today.  Concerns regarding medicines are outlined above.  Orders Placed This Encounter  Procedures   CT CARDIAC SCORING (SELF PAY ONLY)   No orders of the defined types were placed in this encounter.   Patient Instructions  Medication Instructions:  The current medical regimen is effective;  continue present plan and medications.  *If you need a refill on your cardiac medications before your next appointment, please call your pharmacy*  Testing/Procedures: Your physician has requested that you have a Coronary Calcium score which is completed by CT. Cardiac computed tomography (CT) is a painless test that uses an x-ray machine to take clear, detailed pictures of your heart. There are no instructions for this testing.  You may eat/drink and take your normal medications this day.  The cost of the testing is $99 due at the time of your appointment.  Follow-Up: At [facility], you and your health needs are our priority.  As part of our continuing mission to provide you with exceptional heart care, we have created designated Provider Care Teams.  These Care Teams include your primary Cardiologist (physician) and Advanced Practice Providers (APPs -  Physician Assistants and Nurse Practitioners) who all work together to provide you with the care you need, when you need it.  We recommend signing up for the patient portal called "MyChart".  Sign up information is provided on this After Visit Summary.  MyChart is used to connect with patients for Virtual Visits  (Telemedicine).  Patients are able to view lab/test results, encounter notes, upcoming appointments, etc.  Non-urgent messages can be sent to your provider as well.   To learn more about what you can do with MyChart, go to [email or web address].    Your next appointment:   Follow up as needed after the above testing.   Important Information About Sugar         Signed, Candee Furbish, MD  02/14/2022 4:46 PM    [facility]

## 2022-02-16 ENCOUNTER — Telehealth: Payer: Self-pay | Admitting: *Deleted

## 2022-02-16 NOTE — Progress Notes (Signed)
Kindly call the patient and notify results of heart monitor as she has not reviewed it yet in MyChart

## 2022-02-16 NOTE — Telephone Encounter (Signed)
-----   Message from Garvin Fila, MD sent at 02/16/2022  8:22 AM EDT ----- Kindly call the patient and notify results of heart monitor as she has not reviewed it yet in [place]

## 2022-02-16 NOTE — Telephone Encounter (Signed)
Your outpatient heart monitor study did not show any evidence of atrial fibrillation or any other worrisome arrhythmias.  Nothing to worry about  Written by Garvin Fila, MD on 02/11/2022  3:47 PM EDT   I called pt and relayed results she verbalized understanding and appreciation for the call. We all discussed the need for r/s her appt and I have rescheduled for 03/16/2022 at 130 pm.

## 2022-02-28 ENCOUNTER — Inpatient Hospital Stay: Payer: PPO | Admitting: Neurology

## 2022-02-28 DIAGNOSIS — E559 Vitamin D deficiency, unspecified: Secondary | ICD-10-CM | POA: Diagnosis not present

## 2022-02-28 DIAGNOSIS — E039 Hypothyroidism, unspecified: Secondary | ICD-10-CM | POA: Diagnosis not present

## 2022-02-28 DIAGNOSIS — G894 Chronic pain syndrome: Secondary | ICD-10-CM | POA: Diagnosis not present

## 2022-02-28 DIAGNOSIS — L659 Nonscarring hair loss, unspecified: Secondary | ICD-10-CM | POA: Diagnosis not present

## 2022-03-02 ENCOUNTER — Other Ambulatory Visit: Payer: Self-pay | Admitting: Family Medicine

## 2022-03-02 DIAGNOSIS — R7303 Prediabetes: Secondary | ICD-10-CM | POA: Diagnosis not present

## 2022-03-02 DIAGNOSIS — Z859 Personal history of malignant neoplasm, unspecified: Secondary | ICD-10-CM

## 2022-03-02 DIAGNOSIS — E039 Hypothyroidism, unspecified: Secondary | ICD-10-CM

## 2022-03-02 DIAGNOSIS — E538 Deficiency of other specified B group vitamins: Secondary | ICD-10-CM | POA: Diagnosis not present

## 2022-03-02 DIAGNOSIS — E559 Vitamin D deficiency, unspecified: Secondary | ICD-10-CM | POA: Diagnosis not present

## 2022-03-02 DIAGNOSIS — E049 Nontoxic goiter, unspecified: Secondary | ICD-10-CM

## 2022-03-02 DIAGNOSIS — R1310 Dysphagia, unspecified: Secondary | ICD-10-CM

## 2022-03-02 DIAGNOSIS — R768 Other specified abnormal immunological findings in serum: Secondary | ICD-10-CM

## 2022-03-03 ENCOUNTER — Ambulatory Visit
Admission: RE | Admit: 2022-03-03 | Discharge: 2022-03-03 | Disposition: A | Discharge status: Home / Self Care | Payer: Medicare HMO | Source: Ambulatory Visit | Attending: Family Medicine | Admitting: Family Medicine

## 2022-03-03 DIAGNOSIS — E039 Hypothyroidism, unspecified: Secondary | ICD-10-CM | POA: Diagnosis not present

## 2022-03-03 DIAGNOSIS — R1310 Dysphagia, unspecified: Secondary | ICD-10-CM

## 2022-03-03 DIAGNOSIS — R768 Other specified abnormal immunological findings in serum: Secondary | ICD-10-CM

## 2022-03-03 DIAGNOSIS — E049 Nontoxic goiter, unspecified: Secondary | ICD-10-CM

## 2022-03-03 DIAGNOSIS — Z859 Personal history of malignant neoplasm, unspecified: Secondary | ICD-10-CM

## 2022-03-04 ENCOUNTER — Ambulatory Visit (HOSPITAL_BASED_OUTPATIENT_CLINIC_OR_DEPARTMENT_OTHER): Payer: Medicare HMO | Attending: Cardiology

## 2022-03-16 ENCOUNTER — Ambulatory Visit: Payer: Medicare HMO | Admitting: Neurology

## 2022-03-16 ENCOUNTER — Encounter: Payer: Self-pay | Admitting: Neurology

## 2022-03-16 VITALS — BP 163/81 | HR 60 | Ht 66.5 in | Wt 245.0 lb

## 2022-03-16 DIAGNOSIS — G459 Transient cerebral ischemic attack, unspecified: Secondary | ICD-10-CM | POA: Diagnosis not present

## 2022-03-16 DIAGNOSIS — G44209 Tension-type headache, unspecified, not intractable: Secondary | ICD-10-CM | POA: Diagnosis not present

## 2022-03-16 DIAGNOSIS — G43009 Migraine without aura, not intractable, without status migrainosus: Secondary | ICD-10-CM | POA: Diagnosis not present

## 2022-03-16 MED ORDER — TOPIRAMATE 25 MG PO TABS: 25.0000 mg | ORAL_TABLET | Freq: Two times a day (BID) | ORAL | 3 refills | Status: DC

## 2022-03-16 MED ORDER — NURTEC 75 MG PO TBDP: 75.0000 mg | ORAL_TABLET | ORAL | 1 refills | Status: AC

## 2022-03-16 NOTE — Patient Instructions (Signed)
I had a long d/w patient about recent TIA, new diagnosis of mixed migraine and tension headache risk for recurrent stroke/TIAs, personally independently reviewed imaging studies and stroke evaluation results and answered questions.Continue aspirin 81 mg daily  for secondary stroke prevention and maintain strict control of hypertension with blood pressure goal below 130/90, diabetes with hemoglobin A1c goal below 6.5% and lipids with LDL cholesterol goal below 70 mg/dL. I also advised the patient to eat a healthy diet with plenty of whole grains, cereals, fruits and vegetables, exercise regularly and maintain ideal body weight .patient is enrolled in the  [facility] stroke prevention study but currently 30 medications on hold because of her headaches and if headaches resolved she is willing to consider going back on the study medication.  I recommend treatment trial of Nurtec 75 mg for symptomatic relief of her headaches but not more than 3 days/week start Topamax 25 mg twice daily for headache prophylaxis.  I have discussed side effects with the patient..  And advised to call me if needed.  I also encouraged her to do regular neck stretching exercises and increase participation in regular activities for stress relaxation.  Return for follow-up in 6 months or call earlier if necessary.  Neck Exercises Ask your health care provider which exercises are safe for you. Do exercises exactly as told by your health care provider and adjust them as directed. It is normal to feel mild stretching, pulling, tightness, or discomfort as you do these exercises. Stop right away if you feel sudden pain or your pain gets worse. Do not begin these exercises until told by your health care provider. Neck exercises can be important for many reasons. They can improve strength and maintain flexibility in your neck, which will help your upper back and prevent neck pain. Stretching exercises Rotation neck stretching  Sit in a chair or  stand up. Place your feet flat on the floor, shoulder-width apart. Slowly turn your head (rotate) to the right until a slight stretch is felt. Turn it all the way to the right so you can look over your right shoulder. Do not tilt or tip your head. Hold this position for 10-30 seconds. Slowly turn your head (rotate) to the left until a slight stretch is felt. Turn it all the way to the left so you can look over your left shoulder. Do not tilt or tip your head. Hold this position for 10-30 seconds. Repeat __________ times. Complete this exercise __________ times a day. Neck retraction  Sit in a sturdy chair or stand up. Look straight ahead. Do not bend your neck. Use your fingers to push your chin backward (retraction). Do not bend your neck for this movement. Continue to face straight ahead. If you are doing the exercise properly, you will feel a slight sensation in your throat and a stretch at the back of your neck. Hold the stretch for 1-2 seconds. Repeat __________ times. Complete this exercise __________ times a day. Strengthening exercises Neck press  Lie on your back on a firm bed or on the floor with a pillow under your head. Use your neck muscles to push your head down on the pillow and straighten your spine. Hold the position as well as you can. Keep your head facing up (in a neutral position) and your chin tucked. Slowly count to 5 while holding this position. Repeat __________ times. Complete this exercise __________ times a day. Isometrics These are exercises in which you strengthen the muscles in your neck  while keeping your neck still (isometrics). Sit in a supportive chair and place your hand on your forehead. Keep your head and face facing straight ahead. Do not flex or extend your neck while doing isometrics. Push forward with your head and neck while pushing back with your hand. Hold for 10 seconds. Do the sequence again, this time putting your hand against the back of  your head. Use your head and neck to push backward against the hand pressure. Finally, do the same exercise on either side of your head, pushing sideways against the pressure of your hand. Repeat __________ times. Complete this exercise __________ times a day. Prone head lifts  Lie face-down (prone position), resting on your elbows so that your chest and upper back are raised. Start with your head facing downward, near your chest. Position your chin either on or near your chest. Slowly lift your head upward. Lift until you are looking straight ahead. Then continue lifting your head as far back as you can comfortably stretch. Hold your head up for 5 seconds. Then slowly lower it to your starting position. Repeat __________ times. Complete this exercise __________ times a day. Supine head lifts  Lie on your back (supine position), bending your knees to point to the ceiling and keeping your feet flat on the floor. Lift your head slowly off the floor, raising your chin toward your chest. Hold for 5 seconds. Repeat __________ times. Complete this exercise __________ times a day. Scapular retraction  Stand with your arms at your sides. Look straight ahead. Slowly pull both shoulders (scapulae) backward and downward (retraction) until you feel a stretch between your shoulder blades in your upper back. Hold for 10-30 seconds. Relax and repeat. Repeat __________ times. Complete this exercise __________ times a day. Contact a health care provider if: Your neck pain or discomfort gets worse when you do an exercise. Your neck pain or discomfort does not improve within 2 hours after you exercise. If you have any of these problems, stop exercising right away. Do not do the exercises again unless your health care provider says that you can. Get help right away if: You develop sudden, severe neck pain. If this happens, stop exercising right away. Do not do the exercises again unless your health care  provider says that you can. This information is not intended to replace advice given to you by your health care provider. Make sure you discuss any questions you have with your health care provider. Document Revised: 01/05/2021 Document Reviewed: 01/05/2021 Elsevier Patient Education  [place].    Stroke Prevention Some medical conditions and behaviors can lead to a higher chance of having a stroke. You can help prevent a stroke by eating healthy, exercising, not smoking, and managing any medical conditions you have. Stroke is a leading cause of functional impairment. Primary prevention is particularly important because a majority of strokes are first-time events. Stroke changes the lives of not only those who experience a stroke but also their family and other caregivers. How can this condition affect me? A stroke is a medical emergency and should be treated right away. A stroke can lead to brain damage and can sometimes be life-threatening. If a person gets medical treatment right away, there is a better chance of surviving and recovering from a stroke. What can increase my risk? The following medical conditions may increase your risk of a stroke: Cardiovascular disease. High blood pressure (hypertension). Diabetes. High cholesterol. Sickle cell disease. Blood clotting disorders (hypercoagulable state).  Obesity. Sleep disorders (obstructive sleep apnea). Other risk factors include: Being older than age 69. Having a history of blood clots, stroke, or mini-stroke (transient ischemic attack, TIA). Genetic factors, such as race, ethnicity, or a family history of stroke. Smoking cigarettes or using other tobacco products. Taking birth control pills, especially if you also use tobacco. Heavy use of alcohol or drugs, especially cocaine and methamphetamine. Physical inactivity. What actions can I take to prevent this? Manage your health conditions High cholesterol levels. Eating  a healthy diet is important for preventing high cholesterol. If cholesterol cannot be managed through diet alone, you may need to take medicines. Take any prescribed medicines to control your cholesterol as told by your health care provider. Hypertension. To reduce your risk of stroke, try to keep your blood pressure below 130/80. Eating a healthy diet and exercising regularly are important for controlling blood pressure. If these steps are not enough to manage your blood pressure, you may need to take medicines. Take any prescribed medicines to control hypertension as told by your health care provider. Ask your health care provider if you should monitor your blood pressure at home. Have your blood pressure checked every year, even if your blood pressure is normal. Blood pressure increases with age and some medical conditions. Diabetes. Eating a healthy diet and exercising regularly are important parts of managing your blood sugar (glucose). If your blood sugar cannot be managed through diet and exercise, you may need to take medicines. Take any prescribed medicines to control your diabetes as told by your health care provider. Get evaluated for obstructive sleep apnea. Talk to your health care provider about getting a sleep evaluation if you snore a lot or have excessive sleepiness. Make sure that any other medical conditions you have, such as atrial fibrillation or atherosclerosis, are managed. Nutrition Follow instructions from your health care provider about what to eat or drink to help manage your health condition. These instructions may include: Reducing your daily calorie intake. Limiting how much salt (sodium) you use to 1,500 milligrams (mg) each day. Using only healthy fats for cooking, such as olive oil, canola oil, or sunflower oil. Eating healthy foods. You can do this by: Choosing foods that are high in fiber, such as whole grains, and fresh fruits and vegetables. Eating at least 5  servings of fruits and vegetables a day. Try to fill one-half of your plate with fruits and vegetables at each meal. Choosing lean protein foods, such as lean cuts of meat, poultry without skin, fish, tofu, beans, and nuts. Eating low-fat dairy products. Avoiding foods that are high in sodium. This can help lower blood pressure. Avoiding foods that have saturated fat, trans fat, and cholesterol. This can help prevent high cholesterol. Avoiding processed and prepared foods. Counting your daily carbohydrate intake.  Lifestyle If you drink alcohol: Limit how much you have to: 0-1 drink a day for women who are not pregnant. 0-2 drinks a day for men. Know how much alcohol is in your drink. In the [place] one drink equals one 12 oz bottle of beer (370m), one 5 oz glass of wine (1431m, or one 1 oz glass of hard liquor (4451m Do not use any products that contain nicotine or tobacco. These products include cigarettes, chewing tobacco, and vaping devices, such as e-cigarettes. If you need help quitting, ask your health care provider. Avoid secondhand smoke. Do not use drugs. Activity  Try to stay at a healthy weight. Get at least 30 minutes of exercise  on most days, such as: Fast walking. Biking. Swimming. Medicines Take over-the-counter and prescription medicines only as told by your health care provider. Aspirin or blood thinners (antiplatelets or anticoagulants) may be recommended to reduce your risk of forming blood clots that can lead to stroke. Avoid taking birth control pills. Talk to your health care provider about the risks of taking birth control pills if: You are over 15 years old. You smoke. You get very bad headaches. You have had a blood clot. Where to find more information American Stroke Association: www.strokeassociation.org Get help right away if: You or a loved one has any symptoms of a stroke. "BE FAST" is an easy way to remember the main warning signs of a stroke: B -  Balance. Signs are dizziness, sudden trouble walking, or loss of balance. E - Eyes. Signs are trouble seeing or a sudden change in vision. F - Face. Signs are sudden weakness or numbness of the face, or the face or eyelid drooping on one side. A - Arms. Signs are weakness or numbness in an arm. This happens suddenly and usually on one side of the body. S - Speech. Signs are sudden trouble speaking, slurred speech, or trouble understanding what people say. T - Time. Time to call emergency services. Write down what time symptoms started. You or a loved one has other signs of a stroke, such as: A sudden, severe headache with no known cause. Nausea or vomiting. Seizure. These symptoms may represent a serious problem that is an emergency. Do not wait to see if the symptoms will go away. Get medical help right away. Call your local emergency services (911 in the [place] Do not drive yourself to the hospital. Summary You can help to prevent a stroke by eating healthy, exercising, not smoking, limiting alcohol intake, and managing any medical conditions you may have. Do not use any products that contain nicotine or tobacco. These include cigarettes, chewing tobacco, and vaping devices, such as e-cigarettes. If you need help quitting, ask your health care provider. Remember "BE FAST" for warning signs of a stroke. Get help right away if you or a loved one has any of these signs. This information is not intended to replace advice given to you by your health care provider. Make sure you discuss any questions you have with your health care provider. Document Revised: 02/10/2020 Document Reviewed: 02/10/2020 Elsevier Patient Education  [place].

## 2022-03-16 NOTE — Progress Notes (Signed)
[facility] [address] [place]. [place] [address] [phone]       OFFICE FOLLOW-UP NOTE  Ms. Carol [name] Date of Birth:  05-Jun-1956 Medical Record Number:  [id number]   HPI: Carol [name] is a 66 year old pleasant Caucasian lady seen today for initial office visit for follow-up of stroke.  History is obtained on the patient and review of electronic medical records.  I have personally reviewed pertinent available imaging films in PACS. Carol [name] is a 66 y.o. female with a past medical history significant for hypertension, anxiety, depression and low back pain. Patient states that she has been in a good state of health recently and had been home all day. She states that at 14:00 she was watching TV and had a sudden onset of dizziness, where she was somewhat disoriented and had right eye blurry vision. She noticed having a right quadrant deficit and could not see part of her TV. She also noted having increased numbness of her RUE/RLE associated with some weakness. She was not on any ASA /Statin Therapy. Patient was worried about a stroke and called EMS from her cell phone. EMS arrived and @ the time she was able to get off the couch and opened the door. EMS reported right sided numbness and visual changes. Patient was able to walk to the stretcher. No report of any drift, speech difficulty, facial droop, nausea, vomiting or syncope.  Dizziness, disorientation and right-sided weakness numbness resolved continuously blurred vision in the right eye persisted the next day.  She had history of intraductal breast carcinoma treated with lumpectomy and radiation but currently not on any chemotherapy had a good life expectancy. Stroke scale was 0 but ABCD2 square score was high at 6.  She was felt to have left brain TIA and right eye blurred vision due to retinal artery branch occlusion.  CT head was unremarkable.  CT angiogram of the head and neck showed no large vessel stenosis or  occlusion.  MRI showed no acute abnormality and only changes of chronic small vessel disease.  2D echo showed ejection fraction of 70 to 75%.  LDL cholesterol is 86 mg percent.  Hemoglobin A1c was 5.6.  Patient qualified for and was enrolled in the [facility] stroke prevention study.  Patient states she did well after discharge but started developing headaches and she stopped the study medication as well for depression medication upon advice from her primary care physician.  The headaches went away and then she restarted the study medication but the headaches came back in 2 weeks later she stopped the study medication again.  She has been off the study medication now for more than a month but she still gets headaches about 2-3 times per week.  She describes this headache has been moderate to severe in intensity 9/10.  Mostly in the right temporal and frontal region.  There is accompanying nausea as well as light and sound sensitivity.  She also sees lightening bolt.  The headache seems to be increased with physical activity.  Needs to go to the next room and lie down and sleep to fight it off.  She has been taking Motrin has not been working.  She h also admits to tightness of the neck and shoulder muscles.  She has been under increased stress as she has had to take care of her father 2 grandchildren as her mother is sick.  She is tolerating aspirin well but does bruise easily has had no bleeding.  She  denies any prior history of migraines not never tried any migraine specific medications..     ROS:   14 system review of systems is positive for blurred vision, weakness, numbness, bruising, headache, nausea, light sensitivity, sound sensitivity, stress and all other systems negative  PMH:  Past Medical History:  Diagnosis Date   Anxiety    Arthritis    Balance problems    Chronic anxiety    Chronic depression    Chronic diarrhea    after gallbladder surgery   Chronic low back pain    Chronic seasonal  allergic rhinitis    Complication of anesthesia    pt states has difficulty with being put to sleep    Exogenous obesity    EXTERNAL HEMORRHOIDS 06/26/2007   Annotation: and internal Qualifier: Diagnosis of  By: Garen Grams     Falls    Family history of adverse reaction to anesthesia    pts daughter has N&V   Family history of breast cancer    Family history of prostate cancer    Fatigue    Hemorrhoids    History of blood clots    History of blood transfusion    2006   History of bronchitis    History of chicken pox    History of frequent urinary tract infections    History of gallstones    History of jaundice    History of kidney stones    History of measles as a child    History of mumps as a child    Hypothyroidism    Insomnia    Night sweats    Personal history of radiation therapy    Pneumonia    hx of    Polycythemia    PONV (postoperative nausea and vomiting)    Poor sleep    chronic poor quality sleep wit snoring, sleep study in Nov 2013 showed mild central sleep apnea with hypoxia felt due to narcotics, no significant OSA or RLS   Thyroid disease    Tinnitus    Type II diabetes mellitus ([place]) 11/2016    Social History:  Social History   Socioeconomic History   Marital status: Married    Spouse name: Not on file   Number of children: Not on file   Years of education: Not on file   Highest education level: Not on file  Occupational History   Not on file  Tobacco Use   Smoking status: Never   Smokeless tobacco: Never  Vaping Use   Vaping Use: Never used  Substance and Sexual Activity   Alcohol use: No   Drug use: No   Sexual activity: Yes    Birth control/protection: None, Surgical  Other Topics Concern   Not on file  Social History Narrative   Not on file   Social Determinants of Health   Financial Resource Strain: Low Risk  (05/20/2021)   Overall Financial Resource Strain (CARDIA)    Difficulty of Paying Living Expenses: Not very hard   Food Insecurity: No Food Insecurity (05/20/2021)   Hunger Vital Sign    Worried About Running Out of Food in the Last Year: Never true    Ran Out of Food in the Last Year: Never true  Transportation Needs: No Transportation Needs (05/20/2021)   PRAPARE - Hydrologist (Medical): No    Lack of Transportation (Non-Medical): No  Physical Activity: Not on file  Stress: Stress Concern Present (05/20/2021)   [employer] of  Occupational Health - Occupational Stress Questionnaire    Feeling of Stress : Very much  Social Connections: Moderately Integrated (05/20/2021)   Social Connection and Isolation Panel [NHANES]    Frequency of Communication with Friends and Family: More than three times a week    Frequency of Social Gatherings with Friends and Family: More than three times a week    Attends Religious Services: 1 to 4 times per year    Active Member of [employer] or Organizations: No    Attends Archivist Meetings: Never    Marital Status: Married  Human resources officer Violence: Not on file    Medications:   Current Outpatient Medications on File Prior to Visit  Medication Sig Dispense Refill   ALPRAZolam (XANAX) 0.25 MG tablet Take 0.25 mg by mouth daily as needed for anxiety.     aspirin EC 81 MG tablet Take 1 tablet (81 mg total) by mouth daily. Swallow whole. 30 tablet 12   baclofen (LIORESAL) 10 MG tablet Take 10 mg by mouth 3 (three) times daily.     Biotin 10 MG CAPS Take 10 mg by mouth daily.     busPIRone (BUSPAR) 7.5 MG tablet Take 7.5 mg by mouth 2 (two) times daily.     escitalopram (LEXAPRO) 20 MG tablet Take 20 mg by mouth daily.     folic acid (FOLVITE) 409 MCG tablet Take 800 mcg by mouth daily.     levothyroxine (SYNTHROID) 88 MCG tablet Take 88 mcg by mouth daily before breakfast.     naloxone (NARCAN) nasal spray 4 mg/0.1 mL Place 1 spray into the nose once.     ondansetron (ZOFRAN-ODT) 8 MG disintegrating tablet Take 8 mg by mouth  every 8 (eight) hours as needed for vomiting or nausea.     oxybutynin (DITROPAN-XL) 10 MG 24 hr tablet Take 10 mg by mouth at bedtime.     oxyCODONE (OXY IR/ROXICODONE) 5 MG immediate release tablet Take 1 tablet (5 mg total) by mouth every 6 (six) hours as needed for severe pain. 20 tablet 0   Vitamin D, Ergocalciferol, (DRISDOL) 1.25 MG (50000 UNIT) CAPS capsule Take 50,000 Units by mouth once a week.     No current facility-administered medications on file prior to visit.    Allergies:   Allergies  Allergen Reactions   Acetaminophen Nausea And Vomiting   Celebrex [Celecoxib] Itching   Povidone Iodine Hives    Topical    Tape     Blisters    Codeine Rash   Doxycycline Rash    Physical Exam General: Mildly obese pleasant middle-age Caucasian lady seated, in no evident distress Head: head normocephalic and atraumatic.  Neck: supple with no carotid or supraclavicular bruits Cardiovascular: regular rate and rhythm, no murmurs Musculoskeletal: no deformity Skin:  no rash/petichiae Vascular:  Normal pulses all extremities Vitals:   03/16/22 1340  BP: (!) 163/81  Pulse: 60   Neurologic Exam Mental Status: Awake and fully alert. Oriented to place and time. Recent and remote memory intact. Attention span, concentration and fund of knowledge appropriate. Mood and affect appropriate.  Cranial Nerves: Fundoscopic exam reveals sharp disc margins. Pupils equal, briskly reactive to light. Extraocular movements full without nystagmus. Visual fields full to confrontation. Hearing intact. Facial sensation intact. Face, tongue, palate moves normally and symmetrically.  Motor: Normal bulk and tone. Normal strength in all tested extremity muscles. Sensory.: intact to touch ,pinprick .position and vibratory sensation.  Coordination: Rapid alternating movements normal in all extremities. Finger-to-nose  and heel-to-shin performed accurately bilaterally. Gait and Station: Arises from chair without  difficulty. Stance is normal. Gait demonstrates normal stride length and balance . Able to heel, toe and tandem walk without difficulty.  Reflexes: 1+ and symmetric. Toes downgoing.   NIHSS  0 Modified Rankin  1   ASSESSMENT: 66 year old Caucasian lady with episode of transient left hemispheric and right eye retinal TIA May 2023.  Vascular risk factors of diabetes, hyperlipidemia mild obesity.  She is participating in the [facility] stroke prevention study though presently off study medication.  She has also had new onset frequent headaches with Migraine and tension headache features.     PLAN: I had a long d/w patient about recent TIA, new diagnosis of mixed migraine and tension headache risk for recurrent stroke/TIAs, personally independently reviewed imaging studies and stroke evaluation results and answered questions.Continue aspirin 81 mg daily  for secondary stroke prevention and maintain strict control of hypertension with blood pressure goal below 130/90, diabetes with hemoglobin A1c goal below 6.5% and lipids with LDL cholesterol goal below 70 mg/dL. I also advised the patient to eat a healthy diet with plenty of whole grains, cereals, fruits and vegetables, exercise regularly and maintain ideal body weight .patient is enrolled in the  [facility] stroke prevention study but currently 30 medications on hold because of her headaches and if headaches resolved she is willing to consider going back on the study medication.  I recommend treatment trial of Nurtec 75 mg for symptomatic relief of her headaches but not more than 3 days/week start Topamax 25 mg twice daily for headache prophylaxis.  I have discussed side effects with the patient..  And advised to call me if needed.  I also encouraged her to do regular neck stretching exercises and increase participation in regular activities for stress relaxation.  Return for follow-up in 6 months or call earlier if necessary. Greater than 50% of time during  this 35 minute visit was spent on counseling,explanation of diagnosis, planning of further management, discussion with patient and family and coordination of care Antony Contras, MD Note: This document was prepared with digital dictation and possible smart phrase technology. Any transcriptional errors that result from this process are unintentional ,

## 2022-03-17 ENCOUNTER — Telehealth: Payer: Self-pay

## 2022-03-17 NOTE — Telephone Encounter (Signed)
Nurtec PA [facility] - PA Case ID: [id number] - Rx #: [id number]

## 2022-03-21 NOTE — Telephone Encounter (Signed)
PA for nurtec has been approved through [employer]. PA approved from 03/17/2022-07/24/2022.

## 2022-03-25 DIAGNOSIS — M1712 Unilateral primary osteoarthritis, left knee: Secondary | ICD-10-CM | POA: Diagnosis not present

## 2022-03-30 DIAGNOSIS — J329 Chronic sinusitis, unspecified: Secondary | ICD-10-CM | POA: Diagnosis not present

## 2022-03-30 DIAGNOSIS — J069 Acute upper respiratory infection, unspecified: Secondary | ICD-10-CM | POA: Diagnosis not present

## 2022-03-30 DIAGNOSIS — B9689 Other specified bacterial agents as the cause of diseases classified elsewhere: Secondary | ICD-10-CM | POA: Diagnosis not present

## 2022-03-30 DIAGNOSIS — H6690 Otitis media, unspecified, unspecified ear: Secondary | ICD-10-CM | POA: Diagnosis not present

## 2022-04-12 ENCOUNTER — Ambulatory Visit: Payer: Self-pay

## 2022-04-12 NOTE — Patient Outreach (Signed)
  Care Coordination   04/12/2022 Name: Carol [name] MRN: [id number] DOB: 1955/11/06   Care Coordination Outreach Attempts:  An unsuccessful telephone outreach was attempted today to offer the patient information about available care coordination services as a benefit of their health plan.   Follow Up Plan:  Additional outreach attempts will be made to offer the patient care coordination information and services.   Encounter Outcome:  No Answer  Care Coordination Interventions Activated:  No   Care Coordination Interventions:  No, not indicated    Daneen Schick, BSW, CDP Social Worker, Certified Dementia Practitioner [facility] Care Management  Care Coordination [phone]

## 2022-04-21 DIAGNOSIS — M5416 Radiculopathy, lumbar region: Secondary | ICD-10-CM | POA: Diagnosis not present

## 2022-04-26 ENCOUNTER — Ambulatory Visit (HOSPITAL_BASED_OUTPATIENT_CLINIC_OR_DEPARTMENT_OTHER): Payer: Medicare HMO

## 2022-05-02 ENCOUNTER — Ambulatory Visit: Payer: Self-pay

## 2022-05-02 NOTE — Patient Outreach (Signed)
  Care Coordination   05/02/2022 Name: Carol [name] MRN: [id number] DOB: [birth year]   Care Coordination Outreach Attempts:  A second unsuccessful outreach was attempted today to offer the patient with information about available care coordination services as a benefit of their health plan.     Follow Up Plan:  Additional outreach attempts will be made to offer the patient care coordination information and services.   Encounter Outcome:  No Answer  Care Coordination Interventions Activated:  No   Care Coordination Interventions:  No, not indicated    Daneen Schick, BSW, CDP Social Worker, Certified Dementia Practitioner [facility] Care Management  Care Coordination [phone]

## 2022-05-09 DIAGNOSIS — J45909 Unspecified asthma, uncomplicated: Secondary | ICD-10-CM | POA: Diagnosis not present

## 2022-05-09 DIAGNOSIS — G4700 Insomnia, unspecified: Secondary | ICD-10-CM | POA: Diagnosis not present

## 2022-05-09 DIAGNOSIS — F411 Generalized anxiety disorder: Secondary | ICD-10-CM | POA: Diagnosis not present

## 2022-05-09 DIAGNOSIS — F331 Major depressive disorder, recurrent, moderate: Secondary | ICD-10-CM | POA: Diagnosis not present

## 2022-05-10 ENCOUNTER — Ambulatory Visit: Payer: Self-pay

## 2022-05-10 NOTE — Patient Outreach (Signed)
  Care Coordination   05/10/2022 Name: Carol [name] MRN: [id number] DOB: [birth year]   Care Coordination Outreach Attempts:  A third unsuccessful outreach was attempted today to offer the patient with information about available care coordination services as a benefit of their health plan.   Follow Up Plan:  No further outreach attempts will be made at this time. We have been unable to contact the patient to offer or enroll patient in care coordination services  Encounter Outcome:  No Answer  Care Coordination Interventions Activated:  No   Care Coordination Interventions:  No, not indicated    Daneen Schick, BSW, CDP Social Worker, Certified Dementia Practitioner [place] Coordination [phone]

## 2022-05-24 DIAGNOSIS — E559 Vitamin D deficiency, unspecified: Secondary | ICD-10-CM | POA: Diagnosis not present

## 2022-05-24 DIAGNOSIS — Z114 Encounter for screening for human immunodeficiency virus [HIV]: Secondary | ICD-10-CM | POA: Diagnosis not present

## 2022-05-24 DIAGNOSIS — E039 Hypothyroidism, unspecified: Secondary | ICD-10-CM | POA: Diagnosis not present

## 2022-05-24 DIAGNOSIS — E538 Deficiency of other specified B group vitamins: Secondary | ICD-10-CM | POA: Diagnosis not present

## 2022-05-24 DIAGNOSIS — Z0000 Encounter for general adult medical examination without abnormal findings: Secondary | ICD-10-CM | POA: Diagnosis not present

## 2022-05-24 DIAGNOSIS — E782 Mixed hyperlipidemia: Secondary | ICD-10-CM | POA: Diagnosis not present

## 2022-05-26 DIAGNOSIS — E039 Hypothyroidism, unspecified: Secondary | ICD-10-CM | POA: Diagnosis not present

## 2022-05-26 DIAGNOSIS — E538 Deficiency of other specified B group vitamins: Secondary | ICD-10-CM | POA: Diagnosis not present

## 2022-05-26 DIAGNOSIS — E559 Vitamin D deficiency, unspecified: Secondary | ICD-10-CM | POA: Diagnosis not present

## 2022-05-26 DIAGNOSIS — J45909 Unspecified asthma, uncomplicated: Secondary | ICD-10-CM | POA: Diagnosis not present

## 2022-05-30 DIAGNOSIS — M961 Postlaminectomy syndrome, not elsewhere classified: Secondary | ICD-10-CM | POA: Diagnosis not present

## 2022-05-30 DIAGNOSIS — M5416 Radiculopathy, lumbar region: Secondary | ICD-10-CM | POA: Diagnosis not present

## 2022-06-13 DIAGNOSIS — Z114 Encounter for screening for human immunodeficiency virus [HIV]: Secondary | ICD-10-CM | POA: Diagnosis not present

## 2022-06-13 DIAGNOSIS — E559 Vitamin D deficiency, unspecified: Secondary | ICD-10-CM | POA: Diagnosis not present

## 2022-06-13 DIAGNOSIS — E538 Deficiency of other specified B group vitamins: Secondary | ICD-10-CM | POA: Diagnosis not present

## 2022-06-13 DIAGNOSIS — Z79891 Long term (current) use of opiate analgesic: Secondary | ICD-10-CM | POA: Diagnosis not present

## 2022-06-13 DIAGNOSIS — Z0000 Encounter for general adult medical examination without abnormal findings: Secondary | ICD-10-CM | POA: Diagnosis not present

## 2022-06-13 DIAGNOSIS — E039 Hypothyroidism, unspecified: Secondary | ICD-10-CM | POA: Diagnosis not present

## 2022-06-14 DIAGNOSIS — Z0000 Encounter for general adult medical examination without abnormal findings: Secondary | ICD-10-CM | POA: Diagnosis not present

## 2022-06-14 DIAGNOSIS — Z1339 Encounter for screening examination for other mental health and behavioral disorders: Secondary | ICD-10-CM | POA: Diagnosis not present

## 2022-06-14 DIAGNOSIS — Z1331 Encounter for screening for depression: Secondary | ICD-10-CM | POA: Diagnosis not present

## 2022-06-14 DIAGNOSIS — E039 Hypothyroidism, unspecified: Secondary | ICD-10-CM | POA: Diagnosis not present

## 2022-06-14 DIAGNOSIS — T753XXA Motion sickness, initial encounter: Secondary | ICD-10-CM | POA: Diagnosis not present

## 2022-06-14 DIAGNOSIS — E559 Vitamin D deficiency, unspecified: Secondary | ICD-10-CM | POA: Diagnosis not present

## 2022-06-15 ENCOUNTER — Other Ambulatory Visit: Payer: Self-pay | Admitting: Nurse Practitioner

## 2022-06-15 ENCOUNTER — Other Ambulatory Visit: Payer: Self-pay | Admitting: Family Medicine

## 2022-06-15 DIAGNOSIS — Z9889 Other specified postprocedural states: Secondary | ICD-10-CM

## 2022-06-15 DIAGNOSIS — N644 Mastodynia: Secondary | ICD-10-CM

## 2022-06-27 DIAGNOSIS — Z1211 Encounter for screening for malignant neoplasm of colon: Secondary | ICD-10-CM | POA: Diagnosis not present

## 2022-06-27 DIAGNOSIS — Z23 Encounter for immunization: Secondary | ICD-10-CM | POA: Diagnosis not present

## 2022-06-27 DIAGNOSIS — Z0000 Encounter for general adult medical examination without abnormal findings: Secondary | ICD-10-CM | POA: Diagnosis not present

## 2022-06-27 DIAGNOSIS — S93409A Sprain of unspecified ligament of unspecified ankle, initial encounter: Secondary | ICD-10-CM | POA: Diagnosis not present

## 2022-06-28 DIAGNOSIS — R7303 Prediabetes: Secondary | ICD-10-CM | POA: Diagnosis not present

## 2022-06-28 DIAGNOSIS — E039 Hypothyroidism, unspecified: Secondary | ICD-10-CM | POA: Diagnosis not present

## 2022-06-28 DIAGNOSIS — E538 Deficiency of other specified B group vitamins: Secondary | ICD-10-CM | POA: Diagnosis not present

## 2022-06-28 DIAGNOSIS — E559 Vitamin D deficiency, unspecified: Secondary | ICD-10-CM | POA: Diagnosis not present

## 2022-07-04 ENCOUNTER — Telehealth: Payer: Self-pay

## 2022-07-04 NOTE — Telephone Encounter (Signed)
(  Key: [id number])  Your information has been sent to [facility].

## 2022-07-07 NOTE — Telephone Encounter (Signed)
(  Key: [id number])  This request has been approved.  Please note any additional information provided by [facility] at the bottom of your screen.

## 2022-07-21 DIAGNOSIS — Z87828 Personal history of other (healed) physical injury and trauma: Secondary | ICD-10-CM | POA: Diagnosis not present

## 2022-07-21 DIAGNOSIS — N3000 Acute cystitis without hematuria: Secondary | ICD-10-CM | POA: Diagnosis not present

## 2022-07-29 ENCOUNTER — Telehealth: Payer: Self-pay | Admitting: Gastroenterology

## 2022-07-29 NOTE — Telephone Encounter (Signed)
Hi Dr. Silverio Decamp,   Supervising Provider 07/14/22.   We received a referral for patient to have another colonoscopy done she had GI history with [facility], said she has not been able to get any better and they told her they have done all they can for her. She is requesting a transfer of care and would like to continue her care with [facility]. Records were obtained for yo to review and advise on scheduling.

## 2022-08-02 DIAGNOSIS — K219 Gastro-esophageal reflux disease without esophagitis: Secondary | ICD-10-CM | POA: Diagnosis not present

## 2022-08-05 NOTE — Telephone Encounter (Signed)
Dr Silverio Decamp signed the referral notes by saying she doesn't have any available appointments, Please check with other providers that has an availability and willing per Dr Silverio Decamp    Notes put in box for [facility] to pick up  Thanks !

## 2022-08-08 NOTE — Telephone Encounter (Signed)
Hi Dr. Lorenso Courier,   Would you be willing to accept this transfer?   Thanks

## 2022-08-11 ENCOUNTER — Other Ambulatory Visit: Payer: Self-pay | Admitting: Family Medicine

## 2022-08-11 ENCOUNTER — Encounter: Payer: Self-pay | Admitting: Family Medicine

## 2022-08-11 DIAGNOSIS — Z87828 Personal history of other (healed) physical injury and trauma: Secondary | ICD-10-CM

## 2022-08-11 DIAGNOSIS — G44209 Tension-type headache, unspecified, not intractable: Secondary | ICD-10-CM

## 2022-08-11 NOTE — Telephone Encounter (Signed)
Called patient to schedule left voicemail. 

## 2022-08-22 DIAGNOSIS — M1712 Unilateral primary osteoarthritis, left knee: Secondary | ICD-10-CM | POA: Diagnosis not present

## 2022-08-22 DIAGNOSIS — M25562 Pain in left knee: Secondary | ICD-10-CM | POA: Diagnosis not present

## 2022-08-30 ENCOUNTER — Encounter: Payer: Self-pay | Admitting: Internal Medicine

## 2022-09-01 ENCOUNTER — Institutional Professional Consult (permissible substitution): Payer: Medicare Other | Admitting: Neurology

## 2022-09-07 DIAGNOSIS — J069 Acute upper respiratory infection, unspecified: Secondary | ICD-10-CM | POA: Diagnosis not present

## 2022-09-07 DIAGNOSIS — B9689 Other specified bacterial agents as the cause of diseases classified elsewhere: Secondary | ICD-10-CM | POA: Diagnosis not present

## 2022-09-07 DIAGNOSIS — J329 Chronic sinusitis, unspecified: Secondary | ICD-10-CM | POA: Diagnosis not present

## 2022-09-13 DIAGNOSIS — M961 Postlaminectomy syndrome, not elsewhere classified: Secondary | ICD-10-CM | POA: Diagnosis not present

## 2022-09-13 DIAGNOSIS — G894 Chronic pain syndrome: Secondary | ICD-10-CM | POA: Diagnosis not present

## 2022-09-13 DIAGNOSIS — T8484XS Pain due to internal orthopedic prosthetic devices, implants and grafts, sequela: Secondary | ICD-10-CM | POA: Diagnosis not present

## 2022-09-13 DIAGNOSIS — Z5181 Encounter for therapeutic drug level monitoring: Secondary | ICD-10-CM | POA: Diagnosis not present

## 2022-09-13 DIAGNOSIS — M5136 Other intervertebral disc degeneration, lumbar region: Secondary | ICD-10-CM | POA: Diagnosis not present

## 2022-09-13 DIAGNOSIS — M47896 Other spondylosis, lumbar region: Secondary | ICD-10-CM | POA: Diagnosis not present

## 2022-09-13 DIAGNOSIS — M5416 Radiculopathy, lumbar region: Secondary | ICD-10-CM | POA: Diagnosis not present

## 2022-09-13 DIAGNOSIS — Z981 Arthrodesis status: Secondary | ICD-10-CM | POA: Diagnosis not present

## 2022-09-15 DIAGNOSIS — I10 Essential (primary) hypertension: Secondary | ICD-10-CM | POA: Diagnosis not present

## 2022-09-15 DIAGNOSIS — Z79891 Long term (current) use of opiate analgesic: Secondary | ICD-10-CM | POA: Diagnosis not present

## 2022-09-15 DIAGNOSIS — D0511 Intraductal carcinoma in situ of right breast: Secondary | ICD-10-CM | POA: Diagnosis not present

## 2022-09-15 DIAGNOSIS — M1712 Unilateral primary osteoarthritis, left knee: Secondary | ICD-10-CM | POA: Diagnosis not present

## 2022-09-15 DIAGNOSIS — G459 Transient cerebral ischemic attack, unspecified: Secondary | ICD-10-CM | POA: Diagnosis not present

## 2022-09-27 ENCOUNTER — Telehealth: Payer: Self-pay

## 2022-09-27 ENCOUNTER — Encounter: Payer: Self-pay | Admitting: Cardiology

## 2022-09-27 NOTE — Telephone Encounter (Signed)
   Pre-operative Risk Assessment    Patient Name: Carol [name]  DOB: [birth year] MRN: [id number]     Request for Surgical Clearance    Procedure:  Left total knee arthroplasty   Date of Surgery:  Clearance TBD                                 Surgeon:  Dr. Susa Day  Surgeon's Group or Practice Name:  [facility]  Phone number:  [phone] Fax number:  [phone]   Type of Clearance Requested:   - Medical    Type of Anesthesia:  Not Indicated   Additional requests/questions:    [name]   09/27/2022, 2:40 PM

## 2022-09-27 NOTE — Telephone Encounter (Signed)
  Patient Consent for Virtual Visit         Carol [name] has provided verbal consent on 09/27/2022 for a virtual visit (video or telephone).   CONSENT FOR VIRTUAL VISIT FOR:  Carol [name]  By participating in this virtual visit I agree to the following:  I hereby voluntarily request, consent and authorize [facility] and its employed or contracted physicians, physician assistants, nurse practitioners or other licensed health care professionals (the Practitioner), to provide me with telemedicine health care services (the "Services") as deemed necessary by the treating Practitioner. I acknowledge and consent to receive the Services by the Practitioner via telemedicine. I understand that the telemedicine visit will involve communicating with the Practitioner through live audiovisual communication technology and the disclosure of certain medical information by electronic transmission. I acknowledge that I have been given the opportunity to request an in-person assessment or other available alternative prior to the telemedicine visit and am voluntarily participating in the telemedicine visit.  I understand that I have the right to withhold or withdraw my consent to the use of telemedicine in the course of my care at any time, without affecting my right to future care or treatment, and that the Practitioner or I may terminate the telemedicine visit at any time. I understand that I have the right to inspect all information obtained and/or recorded in the course of the telemedicine visit and may receive copies of available information for a reasonable fee.  I understand that some of the potential risks of receiving the Services via telemedicine include:  Delay or interruption in medical evaluation due to technological equipment failure or disruption; Information transmitted may not be sufficient (e.g. poor resolution of images) to allow for appropriate medical decision making by the Practitioner;  and/or  In rare instances, security protocols could fail, causing a breach of personal health information.  Furthermore, I acknowledge that it is my responsibility to provide information about my medical history, conditions and care that is complete and accurate to the best of my ability. I acknowledge that Practitioner's advice, recommendations, and/or decision may be based on factors not within their control, such as incomplete or inaccurate data provided by me or distortions of diagnostic images or specimens that may result from electronic transmissions. I understand that the practice of medicine is not an exact science and that Practitioner makes no warranties or guarantees regarding treatment outcomes. I acknowledge that a copy of this consent can be made available to me via my patient portal ([place]), or I can request a printed copy by calling the office of [facility].    I understand that my insurance will be billed for this visit.   I have read or had this consent read to me. I understand the contents of this consent, which adequately explains the benefits and risks of the Services being provided via telemedicine.  I have been provided ample opportunity to ask questions regarding this consent and the Services and have had my questions answered to my satisfaction. I give my informed consent for the services to be provided through the use of telemedicine in my medical care

## 2022-09-27 NOTE — Telephone Encounter (Signed)
[facility]  

## 2022-09-27 NOTE — Telephone Encounter (Signed)
Patient scheduled for telehealth visit on 3/8. Med rec and consent done.

## 2022-09-27 NOTE — Telephone Encounter (Signed)
   Name: Carol [name]  DOB: 10-27-1955  MRN: [id number]  Primary Cardiologist: None   Preoperative team, please contact this patient and set up a phone call appointment for further preoperative risk assessment. Please obtain consent and complete medication review. Thank you for your help.  I confirm that guidance regarding antiplatelet and oral anticoagulation therapy has been completed and, if necessary, noted below.  None requested.   Deberah Pelton, NP 09/27/2022, 3:30 PM [place]

## 2022-09-28 DIAGNOSIS — E039 Hypothyroidism, unspecified: Secondary | ICD-10-CM | POA: Diagnosis not present

## 2022-09-28 DIAGNOSIS — Z87898 Personal history of other specified conditions: Secondary | ICD-10-CM | POA: Diagnosis not present

## 2022-09-28 DIAGNOSIS — E559 Vitamin D deficiency, unspecified: Secondary | ICD-10-CM | POA: Diagnosis not present

## 2022-09-28 DIAGNOSIS — E782 Mixed hyperlipidemia: Secondary | ICD-10-CM | POA: Diagnosis not present

## 2022-09-28 NOTE — Progress Notes (Signed)
Virtual Visit via Telephone Note   Because of Carol [name]'s co-morbid illnesses, she is at least at moderate risk for complications without adequate follow up.  This format is felt to be most appropriate for this patient at this time.  The patient did not have access to video technology/had technical difficulties with video requiring transitioning to audio format only (telephone).  All issues noted in this document were discussed and addressed.  No physical exam could be performed with this format.  Please refer to the patient's chart for her consent to telehealth for [facility].  Evaluation Performed:  Preoperative cardiovascular risk assessment _____________   Date:  09/30/2022   Patient ID:  Carol [name], DOB 03-01-1956, MRN [id number] Patient Location:  Home Provider location:   Office  Primary Care Provider:  [name], MD Primary Cardiologist:  Candee Furbish, MD  Chief Complaint / Patient Profile   67 y.o. y/o female with a h/o TIA, palpitations, PACs and PVCs, family history of CAD who is pending left total knee arthroplasty and presents today for telephonic preoperative cardiovascular risk assessment.  History of Present Illness    Carol [name] is a 67 y.o. female who presents via audio/video conferencing for a telehealth visit today.  Pt was last seen in cardiology clinic on 02/14/2022 by Dr. Marlou Porch.  At that time MAKALYNN KAAKE was doing well.  The patient is now pending procedure as outlined above. Since her last visit, she denies chest pain, shortness of breath, lower extremity edema, fatigue, palpitations, melena, hematuria, hemoptysis, diaphoresis, weakness, presyncope, syncope, orthopnea, and PND. She is somewhat limited by knee pain but is able to achieve > 4 METS activity without concerning cardiac symptoms.   Past Medical History    Past Medical History:  Diagnosis Date   Anxiety    Arthritis    Balance problems    Chronic anxiety    Chronic  depression    Chronic diarrhea    after gallbladder surgery   Chronic low back pain    Chronic seasonal allergic rhinitis    Complication of anesthesia    pt states has difficulty with being put to sleep    Exogenous obesity    EXTERNAL HEMORRHOIDS 06/26/2007   Annotation: and internal Qualifier: Diagnosis of  By: Garen Grams     Falls    Family history of adverse reaction to anesthesia    pts daughter has N&V   Family history of breast cancer    Family history of prostate cancer    Fatigue    Hemorrhoids    History of blood clots    History of blood transfusion    2006   History of bronchitis    History of chicken pox    History of frequent urinary tract infections    History of gallstones    History of jaundice    History of kidney stones    History of measles as a child    History of mumps as a child    Hypothyroidism    Insomnia    Night sweats    Personal history of radiation therapy    Pneumonia    hx of    Polycythemia    PONV (postoperative nausea and vomiting)    Poor sleep    chronic poor quality sleep wit snoring, sleep study in Nov 2013 showed mild central sleep apnea with hypoxia felt due to narcotics, no significant OSA or RLS   Thyroid disease  Tinnitus    Type II diabetes mellitus ([place]) 11/2016   Past Surgical History:  Procedure Laterality Date   ABDOMINAL HYSTERECTOMY     APPENDECTOMY  1972   BACK SURGERY     BREAST BIOPSY Right    years ago- unsure when   BREAST LUMPECTOMY     BREAST LUMPECTOMY WITH RADIOACTIVE SEED LOCALIZATION Right 03/25/2021   Procedure: RIGHT BREAST LUMPECTOMY WITH RADIOACTIVE SEED LOCALIZATION X2;  Surgeon: Stark Klein, MD;  Location: [facility];  Service: General;  Laterality: Right;   CHOLECYSTECTOMY  1987   COLONOSCOPY  11/2011   showed mild diverticulosis, internal hemorrhoids, and no polyps   ENDOSCOPY with esophageal dilatation  03/14/2016   KNEE ARTHROSCOPY Right 01/07/2016   Procedure: ARTHROSCOPY RIGHT  KNEE, DEBRIDEMENT OF ARTHROFIBROSIS, AND EXAM UNDER ANESTHESIA;  Surgeon: Susa Day, MD;  Location: [facility];  Service: Orthopedics;  Laterality: Right;   KNEE ARTHROSCOPY Right 10/04/2017   Procedure: Right knee arthroscopy, evaluation under anesthesia, lysis of adhesions;  Surgeon: Susa Day, MD;  Location: [facility];  Service: Orthopedics;  Laterality: Right;  60 mins   KNEE SURGERY     L3/4  fusion  2004   L3/4 discectomy  2003   L4/5 discectomy  2001   L4/5 fusion  2002   left foot surgery      to repair break - 2002   left knee meniscectomy  2012   lumbar spine ESI  12/2017   menicus tear     bilat;    RE-EXCISION OF BREAST LUMPECTOMY Right 04/29/2021   Procedure: RE-EXCISION OF RIGHT BREAST LUMPECTOMY;  Surgeon: Stark Klein, MD;  Location: [facility];  Service: General;  Laterality: Right;   right knee arthroscopy  12/2015   Dr. Tonita Cong   right knee meniscectomy  2012   TONSILLECTOMY     TOTAL KNEE ARTHROPLASTY Right 03/27/2015   Procedure: RIGHT TOTAL KNEE ARTHROPLASTY;  Surgeon: Susa Day, MD;  Location: [facility];  Service: Orthopedics;  Laterality: Right;   total knee replacement Right 03/2015   VAGINAL HYSTERECTOMY  1996   with ovaries intact, accidental bladder laceration   WRIST SURGERY  11/2016   Dr. Amedeo Plenty    Allergies  Allergies  Allergen Reactions   Acetaminophen Nausea And Vomiting   Celebrex [Celecoxib] Itching   Povidone Iodine Hives    Topical    Tape     Blisters    Codeine Rash   Doxycycline Rash    Home Medications    Prior to Admission medications   Medication Sig Start Date End Date Taking? Authorizing Provider  aspirin EC 81 MG tablet Take 1 tablet (81 mg total) by mouth daily. Swallow whole. 12/14/21   Elgergawy, Silver Huguenin, MD  baclofen (LIORESAL) 10 MG tablet Take 10 mg by mouth 3 (three) times daily.    [provider]  Biotin 10 MG CAPS Take 10 mg by mouth daily.    [provider]  escitalopram (LEXAPRO) 20 MG tablet  Take 20 mg by mouth daily. 11/30/21   [provider]  folic acid (FOLVITE) Q000111Q MCG tablet Take 800 mcg by mouth daily. 03/02/22   [provider]  levothyroxine (SYNTHROID) 88 MCG tablet Take 88 mcg by mouth daily before breakfast.    [provider]  naloxone (NARCAN) nasal spray 4 mg/0.1 mL Place 1 spray into the nose once. 10/02/21   [provider]  ondansetron (ZOFRAN-ODT) 8 MG disintegrating tablet Take 8 mg by mouth every 8 (eight) hours  as needed for vomiting or nausea.    [provider]  oxybutynin (DITROPAN-XL) 10 MG 24 hr tablet Take 10 mg by mouth at bedtime.    [provider]  oxyCODONE (OXY IR/ROXICODONE) 5 MG immediate release tablet Take 1 tablet (5 mg total) by mouth every 6 (six) hours as needed for severe pain. 03/25/21   Stark Klein, MD  topiramate (TOPAMAX) 25 MG tablet Take 1 tablet (25 mg total) by mouth 2 (two) times daily. 03/16/22   Garvin Fila, MD  Vitamin D, Ergocalciferol, (DRISDOL) 1.25 MG (50000 UNIT) CAPS capsule Take 50,000 Units by mouth once a week. 10/29/21   [provider]    Physical Exam    Vital Signs:  Donavan Burnet does not have vital signs available for review today.  Given telephonic nature of communication, physical exam is limited. AAOx3. NAD. Normal affect.  Speech and respirations are unlabored.  Accessory Clinical Findings    None  Assessment & Plan    1.  Preoperative Cardiovascular Risk Assessment: According to the Revised Cardiac Risk Index (RCRI), her Perioperative Risk of Major Cardiac Event is (%): 0.9. Her Functional Capacity in METs is: 7.59 according to the Duke Activity Status Index (DASI). The patient is doing well from a cardiac perspective. Therefore, based on ACC/AHA guidelines, the patient would be at acceptable risk for the planned procedure without further cardiovascular testing.   The patient was advised that if she develops new symptoms prior to surgery to  contact our office to arrange for a follow-up visit, and she verbalized understanding.  No request to hold cardiac medications.  A copy of this note will be routed to requesting surgeon.  Time:   Today, I have spent 7 minutes with the patient with telehealth technology discussing medical history, symptoms, and management plan.    Emmaline Life, NP-C  09/30/2022, 2:38 PM [address] Office [phone] Fax [phone]

## 2022-09-30 ENCOUNTER — Ambulatory Visit: Payer: Medicare Other | Attending: Internal Medicine | Admitting: Nurse Practitioner

## 2022-09-30 DIAGNOSIS — Z01810 Encounter for preprocedural cardiovascular examination: Secondary | ICD-10-CM | POA: Diagnosis not present

## 2022-10-05 DIAGNOSIS — E559 Vitamin D deficiency, unspecified: Secondary | ICD-10-CM | POA: Diagnosis not present

## 2022-10-05 DIAGNOSIS — F411 Generalized anxiety disorder: Secondary | ICD-10-CM | POA: Diagnosis not present

## 2022-10-05 DIAGNOSIS — E782 Mixed hyperlipidemia: Secondary | ICD-10-CM | POA: Diagnosis not present

## 2022-10-05 DIAGNOSIS — R7303 Prediabetes: Secondary | ICD-10-CM | POA: Diagnosis not present

## 2022-10-05 DIAGNOSIS — E039 Hypothyroidism, unspecified: Secondary | ICD-10-CM | POA: Diagnosis not present

## 2022-10-17 ENCOUNTER — Other Ambulatory Visit (HOSPITAL_COMMUNITY): Payer: Self-pay

## 2022-10-21 ENCOUNTER — Other Ambulatory Visit (HOSPITAL_COMMUNITY): Payer: Self-pay

## 2022-10-21 DIAGNOSIS — M25572 Pain in left ankle and joints of left foot: Secondary | ICD-10-CM | POA: Diagnosis not present

## 2022-10-21 DIAGNOSIS — M25512 Pain in left shoulder: Secondary | ICD-10-CM | POA: Diagnosis not present

## 2022-10-21 DIAGNOSIS — M25562 Pain in left knee: Secondary | ICD-10-CM | POA: Diagnosis not present

## 2022-10-24 ENCOUNTER — Ambulatory Visit: Payer: Self-pay | Admitting: Orthopedic Surgery

## 2022-10-24 ENCOUNTER — Other Ambulatory Visit (HOSPITAL_COMMUNITY): Payer: Self-pay

## 2022-10-24 ENCOUNTER — Telehealth: Payer: Self-pay

## 2022-10-24 MED ORDER — MOUNJARO 7.5 MG/0.5ML ~~LOC~~ SOAJ: 7.5000 mg | SUBCUTANEOUS | 6 refills | Status: DC

## 2022-10-24 MED ORDER — MOUNJARO 5 MG/0.5ML ~~LOC~~ SOAJ
5.0000 mg | SUBCUTANEOUS | 4 refills | Status: DC
  Filled 2022-10-24: qty 2, 28d supply, fill #0

## 2022-10-24 NOTE — Telephone Encounter (Signed)
Clearance letter faxed 

## 2022-10-24 NOTE — H&P (Signed)
Carol [name] is an 67 y.o. female.   Chief Complaint: Left knee pain and instability HPI: For associated symptoms, patient reports popping/clicking and instability but reports no catching/locking. For location, patient reports left and knee. For severity, patient reports pain level 10/10. For aggravating factors, patient reports weightbearing. Follows up for her knee. Last injection with only a few months of relief. Unfortunately she [place], and another fall last week. Reports ongoing pain, instability, swelling. She is on oxycodone for pain. At this point she is wanting to proceed with replacement which we previously had scheduled and had to put on hold for medical reasons. She has been cleared from her neurologic standpoint, is not on any anticoagulation. She just saw her PCP last month.  Past Medical History:  Diagnosis Date   Anxiety    Arthritis    Balance problems    Chronic anxiety    Chronic depression    Chronic diarrhea    after gallbladder surgery   Chronic low back pain    Chronic seasonal allergic rhinitis    Complication of anesthesia    pt states has difficulty with being put to sleep    Exogenous obesity    EXTERNAL HEMORRHOIDS 06/26/2007   Annotation: and internal Qualifier: Diagnosis of  By: Garen Grams     Falls    Family history of adverse reaction to anesthesia    pts daughter has N&V   Family history of breast cancer    Family history of prostate cancer    Fatigue    Hemorrhoids    History of blood clots    History of blood transfusion    2006   History of bronchitis    History of chicken pox    History of frequent urinary tract infections    History of gallstones    History of jaundice    History of kidney stones    History of measles as a child    History of mumps as a child    Hypothyroidism    Insomnia    Night sweats    Personal history of radiation therapy    Pneumonia    hx of     Polycythemia    PONV (postoperative nausea and vomiting)    Poor sleep    chronic poor quality sleep wit snoring, sleep study in Nov 2013 showed mild central sleep apnea with hypoxia felt due to narcotics, no significant OSA or RLS   Thyroid disease    Tinnitus    Type II diabetes mellitus ([place]) 11/2016    Past Surgical History:  Procedure Laterality Date   ABDOMINAL HYSTERECTOMY     APPENDECTOMY  1972   BACK SURGERY     BREAST BIOPSY Right    years ago- unsure when   BREAST LUMPECTOMY     BREAST LUMPECTOMY WITH RADIOACTIVE SEED LOCALIZATION Right 03/25/2021   Procedure: RIGHT BREAST LUMPECTOMY WITH RADIOACTIVE SEED LOCALIZATION X2;  Surgeon: Stark Klein, MD;  Location: [facility];  Service: General;  Laterality: Right;   CHOLECYSTECTOMY  1987   COLONOSCOPY  11/2011   showed mild diverticulosis, internal hemorrhoids, and no polyps   ENDOSCOPY with esophageal dilatation  03/14/2016   KNEE ARTHROSCOPY Right 01/07/2016   Procedure: ARTHROSCOPY RIGHT KNEE, DEBRIDEMENT OF ARTHROFIBROSIS, AND EXAM UNDER ANESTHESIA;  Surgeon: Susa Day, MD;  Location: [facility];  Service: Orthopedics;  Laterality: Right;   KNEE ARTHROSCOPY Right 10/04/2017   Procedure: Right  knee arthroscopy, evaluation under anesthesia, lysis of adhesions;  Surgeon: Susa Day, MD;  Location: [facility];  Service: Orthopedics;  Laterality: Right;  60 mins   KNEE SURGERY     L3/4  fusion  2004   L3/4 discectomy  2003   L4/5 discectomy  2001   L4/5 fusion  2002   left foot surgery      to repair break - 2002   left knee meniscectomy  2012   lumbar spine ESI  12/2017   menicus tear     bilat;    RE-EXCISION OF BREAST LUMPECTOMY Right 04/29/2021   Procedure: RE-EXCISION OF RIGHT BREAST LUMPECTOMY;  Surgeon: Stark Klein, MD;  Location: [facility];  Service: General;  Laterality: Right;   right knee arthroscopy  12/2015   Dr. Tonita Cong   right knee meniscectomy  2012   TONSILLECTOMY     TOTAL KNEE ARTHROPLASTY Right 03/27/2015    Procedure: RIGHT TOTAL KNEE ARTHROPLASTY;  Surgeon: Susa Day, MD;  Location: [facility];  Service: Orthopedics;  Laterality: Right;   total knee replacement Right 03/2015   VAGINAL HYSTERECTOMY  1996   with ovaries intact, accidental bladder laceration   WRIST SURGERY  11/2016   Dr. Amedeo Plenty    Family History  Problem Relation Age of Onset   Hypertension Mother    COPD Mother    Diabetes Mother    Heart attack Father 50   Prostate cancer Maternal Uncle 70   Myasthenia gravis Daughter    Depression Daughter    Other Son        esophageal dysmotility, [employer]   Diabetes type II Other    Other Other        premature cardiovascular disease   Breast cancer Other 108       bilateral, mother's first cousin   Prostate cancer Other        dx 62s, maternal cousin's son   Social History:  reports that she has never smoked. She has never used smokeless tobacco. She reports that she does not drink alcohol and does not use drugs.  Allergies:  Allergies  Allergen Reactions   Acetaminophen Nausea And Vomiting   Celebrex [Celecoxib] Itching   Povidone Iodine Hives    Topical    Tape     Blisters    Codeine Rash   Doxycycline Rash   Current meds: aspirin 81 mg tablet diclofenac 1 % topical gel escitalopram 20 mg tablet folic acid Q000111Q mcg tablet GABA6%/LIDO5%/DICL3% ibuprofen 200 mg tablet levothyroxine 112 mcg tablet Mounjaro 2.5 mg/0.5 mL subcutaneous pen injector ondansetron 8 mg disintegrating tablet oxyBUTYnin chloride ER 10 mg tablet,extended release 24 hr oxyCODONE 10 mg tablet scopolamine 1 mg over 3 days transdermal patch  Review of Systems  Constitutional: Negative.   HENT: Negative.    Eyes: Negative.   Respiratory: Negative.    Cardiovascular: Negative.   Gastrointestinal: Negative.   Endocrine: Negative.   Genitourinary: Negative.   Musculoskeletal:  Positive for arthralgias, back pain, gait problem, joint swelling and myalgias.  Skin: Negative.      There were no vitals taken for this visit. Physical Exam Constitutional:      Appearance: Normal appearance.  HENT:     Head: Normocephalic and atraumatic.     Right Ear: External ear normal.     Left Ear: External ear normal.     Nose: Nose normal.     Mouth/Throat:     Pharynx: Oropharynx is clear.  Eyes:  Conjunctiva/sclera: Conjunctivae normal.  Cardiovascular:     Rate and Rhythm: Normal rate.     Pulses: Normal pulses.  Pulmonary:     Effort: Pulmonary effort is normal.  Abdominal:     General: Bowel sounds are normal.  Musculoskeletal:     Cervical back: Normal range of motion.     Comments: Patient is awake, alert, oriented 3. Overweight. Antalgic gait, no assistive devices.  On examination of the left knee, tender on palpation of the medial and lateral joint line. Nontender patellar tendon, quadriceps tendon, patella, peroneal nerve and popliteal space. No calf pain or sign of DVT. No pain or laxity with varus or valgus stress. No instability noted. Negative McMurray's. Trace effusion noted. Range of motion 0 to 100 degrees. Negative patellofemoral crepitus. No patellofemoral pain on compression. Sensation intact distally.  Skin:    General: Skin is warm and dry.  Neurological:     Mental Status: She is alert.    Updated 4 view left knee x-rays ordered, obtained, reviewed today with no fracture, subluxation, dislocation, lytic or blastic lesions. End-stage degenerative changes left knee medial joint line, varus alignment. Patella tracking midline. She is status post total knee replacement on the right which is in excellent alignment with no signs of osteolysis or loosening.  Assessment/Plan Impression: End-stage left knee osteoarthritis refractory to conservative treatment interfering with quality of life and activities of daily living   Plan: We again discussed relevant anatomy, etiology of her symptoms, course of arthritis and different treatment options. Over  time injection therapy has become less helpful, she is having ongoing pain, instability and swelling interfering with activities of daily living and quality of life, causing falls at this point. At this point it would certainly be reasonable to proceed with left total knee replacement. We will obtain clearance from her PCP. She would like to proceed with this as soon as possible. She is on oxycodone through pain management, will likely require a higher dose of oxycodone postop or even perhaps Dilaudid for a short period of time. We discussed aspirin for DVT prophylaxis. All questions were invited and answered. We discussed the procedure itself as well as risks, complications and alternatives including but not limited to DVT, PE, failure of procedure, need for secondary procedure, anesthesia risk even death. She would like to proceed. She was seen in conjunction with Dr. Tonita Cong today. Clearance sent to her PCP and we will proceed accordingly once we receive that.  Plan: Left total knee replacement  Cecilie Kicks, PA-C for Dr Tonita Cong 10/24/2022, 9:19 AM

## 2022-10-24 NOTE — H&P (View-Only) (Signed)
Carol [name] is an 67 y.o. female.   Chief Complaint: Left knee pain and instability HPI: For associated symptoms, patient reports popping/clicking and instability but reports no catching/locking. For location, patient reports left and knee. For severity, patient reports pain level 10/10. For aggravating factors, patient reports weightbearing. Follows up for her knee. Last injection with only a few months of relief. Unfortunately she had a [place], and another fall last week. Reports ongoing pain, instability, swelling. She is on oxycodone for pain. At this point she is wanting to proceed with replacement which we previously had scheduled and had to put on hold for medical reasons. She has been cleared from her neurologic standpoint, is not on any anticoagulation. She just saw her PCP last month.  Past Medical History:  Diagnosis Date   Anxiety    Arthritis    Balance problems    Chronic anxiety    Chronic depression    Chronic diarrhea    after gallbladder surgery   Chronic low back pain    Chronic seasonal allergic rhinitis    Complication of anesthesia    pt states has difficulty with being put to sleep    Exogenous obesity    EXTERNAL HEMORRHOIDS 06/26/2007   Annotation: and internal Qualifier: Diagnosis of  By: Alleyne, Tiffany     Falls    Family history of adverse reaction to anesthesia    pts daughter has N&V   Family history of breast cancer    Family history of prostate cancer    Fatigue    Hemorrhoids    History of blood clots    History of blood transfusion    2006   History of bronchitis    History of chicken pox    History of frequent urinary tract infections    History of gallstones    History of jaundice    History of kidney stones    History of measles as a child    History of mumps as a child    Hypothyroidism    Insomnia    Night sweats    Personal history of radiation therapy    Pneumonia    hx of     Polycythemia    PONV (postoperative nausea and vomiting)    Poor sleep    chronic poor quality sleep wit snoring, sleep study in Nov 2013 showed mild central sleep apnea with hypoxia felt due to narcotics, no significant OSA or RLS   Thyroid disease    Tinnitus    Type II diabetes mellitus (HCC) 11/2016    Past Surgical History:  Procedure Laterality Date   ABDOMINAL HYSTERECTOMY     APPENDECTOMY  1972   BACK SURGERY     BREAST BIOPSY Right    years ago- unsure when   BREAST LUMPECTOMY     BREAST LUMPECTOMY WITH RADIOACTIVE SEED LOCALIZATION Right 03/25/2021   Procedure: RIGHT BREAST LUMPECTOMY WITH RADIOACTIVE SEED LOCALIZATION X2;  Surgeon: Byerly, Faera, MD;  Location: [facility] OR;  Service: General;  Laterality: Right;   CHOLECYSTECTOMY  1987   COLONOSCOPY  11/2011   showed mild diverticulosis, internal hemorrhoids, and no polyps   ENDOSCOPY with esophageal dilatation  03/14/2016   KNEE ARTHROSCOPY Right 01/07/2016   Procedure: ARTHROSCOPY RIGHT KNEE, DEBRIDEMENT OF ARTHROFIBROSIS, AND EXAM UNDER ANESTHESIA;  Surgeon: Jeffrey Beane, MD;  Location: [facility];  Service: Orthopedics;  Laterality: Right;   KNEE ARTHROSCOPY Right 10/04/2017   Procedure: Right   knee arthroscopy, evaluation under anesthesia, lysis of adhesions;  Surgeon: Beane, Jeffrey, MD;  Location: [facility];  Service: Orthopedics;  Laterality: Right;  60 mins   KNEE SURGERY     L3/4  fusion  2004   L3/4 discectomy  2003   L4/5 discectomy  2001   L4/5 fusion  2002   left foot surgery      to repair break - 2002   left knee meniscectomy  2012   lumbar spine ESI  12/2017   menicus tear     bilat;    RE-EXCISION OF BREAST LUMPECTOMY Right 04/29/2021   Procedure: RE-EXCISION OF RIGHT BREAST LUMPECTOMY;  Surgeon: Byerly, Faera, MD;  Location: [facility] OR;  Service: General;  Laterality: Right;   right knee arthroscopy  12/2015   Dr. Beane   right knee meniscectomy  2012   TONSILLECTOMY     TOTAL KNEE ARTHROPLASTY Right 03/27/2015    Procedure: RIGHT TOTAL KNEE ARTHROPLASTY;  Surgeon: Jeffrey Beane, MD;  Location: [facility];  Service: Orthopedics;  Laterality: Right;   total knee replacement Right 03/2015   VAGINAL HYSTERECTOMY  1996   with ovaries intact, accidental bladder laceration   WRIST SURGERY  11/2016   Dr. Gramig    Family History  Problem Relation Age of Onset   Hypertension Mother    COPD Mother    Diabetes Mother    Heart attack Father 33   Prostate cancer Maternal Uncle 65   Myasthenia gravis Daughter    Depression Daughter    Other Son        esophageal dysmotility, [employer]   Diabetes type II Other    Other Other        premature cardiovascular disease   Breast cancer Other 60       bilateral, mother's first cousin   Prostate cancer Other        dx 50s, maternal cousin's son   Social History:  reports that she has never smoked. She has never used smokeless tobacco. She reports that she does not drink alcohol and does not use drugs.  Allergies:  Allergies  Allergen Reactions   Acetaminophen Nausea And Vomiting   Celebrex [Celecoxib] Itching   Povidone Iodine Hives    Topical    Tape     Blisters    Codeine Rash   Doxycycline Rash   Current meds: aspirin 81 mg tablet diclofenac 1 % topical gel escitalopram 20 mg tablet folic acid 800 mcg tablet GABA6%/LIDO5%/DICL3% ibuprofen 200 mg tablet levothyroxine 112 mcg tablet Mounjaro 2.5 mg/0.5 mL subcutaneous pen injector ondansetron 8 mg disintegrating tablet oxyBUTYnin chloride ER 10 mg tablet,extended release 24 hr oxyCODONE 10 mg tablet scopolamine 1 mg over 3 days transdermal patch  Review of Systems  Constitutional: Negative.   HENT: Negative.    Eyes: Negative.   Respiratory: Negative.    Cardiovascular: Negative.   Gastrointestinal: Negative.   Endocrine: Negative.   Genitourinary: Negative.   Musculoskeletal:  Positive for arthralgias, back pain, gait problem, joint swelling and myalgias.  Skin: Negative.      There were no vitals taken for this visit. Physical Exam Constitutional:      Appearance: Normal appearance.  HENT:     Head: Normocephalic and atraumatic.     Right Ear: External ear normal.     Left Ear: External ear normal.     Nose: Nose normal.     Mouth/Throat:     Pharynx: Oropharynx is clear.  Eyes:       Conjunctiva/sclera: Conjunctivae normal.  Cardiovascular:     Rate and Rhythm: Normal rate.     Pulses: Normal pulses.  Pulmonary:     Effort: Pulmonary effort is normal.  Abdominal:     General: Bowel sounds are normal.  Musculoskeletal:     Cervical back: Normal range of motion.     Comments: Patient is awake, alert, oriented 3. Overweight. Antalgic gait, no assistive devices.  On examination of the left knee, tender on palpation of the medial and lateral joint line. Nontender patellar tendon, quadriceps tendon, patella, peroneal nerve and popliteal space. No calf pain or sign of DVT. No pain or laxity with varus or valgus stress. No instability noted. Negative McMurray's. Trace effusion noted. Range of motion 0 to 100 degrees. Negative patellofemoral crepitus. No patellofemoral pain on compression. Sensation intact distally.  Skin:    General: Skin is warm and dry.  Neurological:     Mental Status: She is alert.    Updated 4 view left knee x-rays ordered, obtained, reviewed today with no fracture, subluxation, dislocation, lytic or blastic lesions. End-stage degenerative changes left knee medial joint line, varus alignment. Patella tracking midline. She is status post total knee replacement on the right which is in excellent alignment with no signs of osteolysis or loosening.  Assessment/Plan Impression: End-stage left knee osteoarthritis refractory to conservative treatment interfering with quality of life and activities of daily living   Plan: We again discussed relevant anatomy, etiology of her symptoms, course of arthritis and different treatment options. Over  time injection therapy has become less helpful, she is having ongoing pain, instability and swelling interfering with activities of daily living and quality of life, causing falls at this point. At this point it would certainly be reasonable to proceed with left total knee replacement. We will obtain clearance from her PCP. She would like to proceed with this as soon as possible. She is on oxycodone through pain management, will likely require a higher dose of oxycodone postop or even perhaps Dilaudid for a short period of time. We discussed aspirin for DVT prophylaxis. All questions were invited and answered. We discussed the procedure itself as well as risks, complications and alternatives including but not limited to DVT, PE, failure of procedure, need for secondary procedure, anesthesia risk even death. She would like to proceed. She was seen in conjunction with Dr. Beane today. Clearance sent to her PCP and we will proceed accordingly once we receive that.  Plan: Left total knee replacement  Dael Howland M Delania Ferg, PA-C for Dr Beane 10/24/2022, 9:19 AM    

## 2022-10-28 NOTE — Patient Instructions (Addendum)
DUE TO COVID-19 ONLY TWO VISITORS  (aged 10116 and older)  ARE ALLOWED TO COME WITH YOU AND STAY IN THE WAITING ROOM ONLY DURING PRE OP AND PROCEDURE.   **NO VISITORS ARE ALLOWED IN THE SHORT STAY AREA OR RECOVERY ROOM!!**  IF YOU WILL BE ADMITTED INTO THE HOSPITAL YOU ARE ALLOWED ONLY FOUR SUPPORT PEOPLE DURING VISITATION HOURS ONLY (7 AM -8PM)   The support person(s) must pass our screening, gel in and out, and wear a mask at all times, including in the patient's room. Patients must also wear a mask when staff or their support person are in the room. Visitors GUEST BADGE MUST BE WORN VISIBLY  One adult visitor may remain with you overnight and MUST be in the room by 8 P.M.     Your procedure is scheduled on: 11/04/22   [facility] Main [facility]    Report to admitting at : 9:00 AM   Call this number if you have problems the morning of surgery [phone]   Do not eat food :After Midnight.   After Midnight you may have the following liquids until: 9:30 AM DAY OF SURGERY  Water Black Coffee (sugar ok, NO MILK/CREAM OR CREAMERS)  Tea (sugar ok, NO MILK/CREAM OR CREAMERS) regular and decaf                             Plain Jell-O (NO RED)                                           Fruit ices (not with fruit pulp, NO RED)                                     Popsicles (NO RED)                                                                  Juice: apple, WHITE grape, WHITE cranberry Sports drinks like Gatorade (NO RED)   The day of surgery:  Drink ONE (1) Pre-Surgery Clear G2 at: 9:30 AM the morning of surgery. Drink in one sitting. Do not sip.  This drink was given to you during your hospital  pre-op appointment visit. Nothing else to drink after completing the  Pre-Surgery Clear Ensure or G2.          If you have questions, please contact your surgeon's office.  Oral Hygiene is also important to reduce your risk of infection.                                     Remember - BRUSH YOUR TEETH THE MORNING OF SURGERY WITH YOUR REGULAR TOOTHPASTE  DENTURES WILL BE REMOVED PRIOR TO SURGERY PLEASE DO NOT APPLY "Poly grip" OR ADHESIVES!!!   Do NOT smoke after Midnight   Take these medicines the morning of surgery with A SIP OF WATER: escitalopram,levothyroxine.Oxycodone as needed.  How to Manage Your Diabetes Before and  After Surgery  Why is it important to control my blood sugar before and after surgery? Improving blood sugar levels before and after surgery helps healing and can limit problems. A way of improving blood sugar control is eating a healthy diet by:  Eating less sugar and carbohydrates  Increasing activity/exercise  Talking with your doctor about reaching your blood sugar goals High blood sugars (greater than 180 mg/dL) can raise your risk of infections and slow your recovery, so you will need to focus on controlling your diabetes during the weeks before surgery. Make sure that the doctor who takes care of your diabetes knows about your planned surgery including the date and location.  How do I manage my blood sugar before surgery? Check your blood sugar at least 4 times a day, starting 2 days before surgery, to make sure that the level is not too high or low. Check your blood sugar the morning of your surgery when you wake up and every 2 hours until you get to the Short Stay unit. If your blood sugar is less than 70 mg/dL, you will need to treat for low blood sugar: Do not take insulin. Treat a low blood sugar (less than 70 mg/dL) with  cup of clear juice (cranberry or apple), 4 glucose tablets, OR glucose gel. Recheck blood sugar in 15 minutes after treatment (to make sure it is greater than 70 mg/dL). If your blood sugar is not greater than 70 mg/dL on recheck, call [phone] for further instructions. Report your blood sugar to the short stay nurse when you get to Short Stay.  If you are admitted to the hospital after surgery: Your  blood sugar will be checked by the staff and you will probably be given insulin after surgery (instead of oral diabetes medicines) to make sure you have good blood sugar levels. The goal for blood sugar control after surgery is 80-180 mg/dL.  WHAT DO I DO ABOUT MY DIABETES MEDICATION?  Do not take oral diabetes medicines (pills) the morning of surgery.  DO NOT TAKE THE FOLLOWING 7 DAYS PRIOR TO SURGERY: Ozempic, Wegovy, Rybelsus (Semaglutide), Byetta (exenatide), Bydureon (exenatide ER), Victoza, Saxenda (liraglutide), or Trulicity (dulaglutide) Mounjaro (Tirzepatide) Adlyxin (Lixisenatide), Polyethylene Glycol Loxenatide.                   You may not have any metal on your body including hair pins, jewelry, and body piercing             Do not wear make-up, lotions, powders, perfumes/cologne, or deodorant  Do not wear nail polish including gel and S&S, artificial/acrylic nails, or any other type of covering on natural nails including finger and toenails. If you have artificial nails, gel coating, etc. that needs to be removed by a nail salon please have this removed prior to surgery or surgery may need to be canceled/ delayed if the surgeon/ anesthesia feels like they are unable to be safely monitored.   Do not shave  48 hours prior to surgery.    Do not bring valuables to the hospital. [facility] IS NOT             RESPONSIBLE   FOR VALUABLES.   Contacts, glasses, or bridgework may not be worn into surgery.   Bring small overnight bag day of surgery.   DO NOT BRING YOUR HOME MEDICATIONS TO THE HOSPITAL. PHARMACY WILL DISPENSE MEDICATIONS LISTED ON YOUR MEDICATION LIST TO YOU DURING YOUR ADMISSION IN THE HOSPITAL!  Patients discharged on the day of surgery will not be allowed to drive home.  Someone NEEDS to stay with you for the first 24 hours after anesthesia.   Special Instructions: Bring a copy of your healthcare power of attorney and living will documents         the day of  surgery if you haven't scanned them before.              Please read over the following fact sheets you were given: IF YOU HAVE QUESTIONS ABOUT YOUR PRE-OP INSTRUCTIONS PLEASE CALL [phone]  Incentive Spirometer  An incentive spirometer is a tool that can help keep your lungs clear and active. This tool measures how well you are filling your lungs with each breath. Taking long deep breaths may help reverse or decrease the chance of developing breathing (pulmonary) problems (especially infection) following: A long period of time when you are unable to move or be active. BEFORE THE PROCEDURE  If the spirometer includes an indicator to show your best effort, your nurse or respiratory therapist will set it to a desired goal. If possible, sit up straight or lean slightly forward. Try not to slouch. Hold the incentive spirometer in an upright position. INSTRUCTIONS FOR USE  Sit on the edge of your bed if possible, or sit up as far as you can in bed or on a chair. Hold the incentive spirometer in an upright position. Breathe out normally. Place the mouthpiece in your mouth and seal your lips tightly around it. Breathe in slowly and as deeply as possible, raising the piston or the ball toward the top of the column. Hold your breath for 3-5 seconds or for as long as possible. Allow the piston or ball to fall to the bottom of the column. Remove the mouthpiece from your mouth and breathe out normally. Rest for a few seconds and repeat Steps 1 through 7 at least 10 times every 1-2 hours when you are awake. Take your time and take a few normal breaths between deep breaths. The spirometer may include an indicator to show your best effort. Use the indicator as a goal to work toward during each repetition. After each set of 10 deep breaths, practice coughing to be sure your lungs are clear. If you have an incision (the cut made at the time of surgery), support your incision when coughing by placing a  pillow or rolled up towels firmly against it. Once you are able to get out of bed, walk around indoors and cough well. You may stop using the incentive spirometer when instructed by your caregiver.  RISKS AND COMPLICATIONS Take your time so you do not get dizzy or light-headed. If you are in pain, you may need to take or ask for pain medication before doing incentive spirometry. It is harder to take a deep breath if you are having pain. AFTER USE Rest and breathe slowly and easily. It can be helpful to keep track of a log of your progress. Your caregiver can provide you with a simple table to help with this. If you are using the spirometer at home, follow these instructions: SEEK MEDICAL CARE IF:  You are having difficultly using the spirometer. You have trouble using the spirometer as often as instructed. Your pain medication is not giving enough relief while using the spirometer. You develop fever of 100.5 F (38.1 C) or higher. SEEK IMMEDIATE MEDICAL CARE IF:  You cough up bloody sputum that had not been present before.  You develop fever of 102 F (38.9 C) or greater. You develop worsening pain at or near the incision site. MAKE SURE YOU:  Understand these instructions. Will watch your condition. Will get help right away if you are not doing well or get worse. Document Released: 11/21/2006 Document Revised: 10/03/2011 Document Reviewed: 01/22/2007 [facility] Patient Information 2014 [place], [place].   ________________________________________________________________________

## 2022-10-31 ENCOUNTER — Encounter (HOSPITAL_COMMUNITY)
Admission: RE | Admit: 2022-10-31 | Discharge: 2022-10-31 | Disposition: A | Discharge status: Home / Self Care | Payer: Medicare Other | Source: Ambulatory Visit | Attending: Specialist | Admitting: Specialist

## 2022-10-31 ENCOUNTER — Other Ambulatory Visit: Payer: Self-pay

## 2022-10-31 ENCOUNTER — Encounter (HOSPITAL_COMMUNITY): Payer: Self-pay

## 2022-10-31 VITALS — BP 144/91 | HR 52 | Temp 98.1°F | Ht 66.5 in | Wt 221.0 lb

## 2022-10-31 DIAGNOSIS — E039 Hypothyroidism, unspecified: Secondary | ICD-10-CM | POA: Insufficient documentation

## 2022-10-31 DIAGNOSIS — M1712 Unilateral primary osteoarthritis, left knee: Secondary | ICD-10-CM | POA: Insufficient documentation

## 2022-10-31 DIAGNOSIS — E119 Type 2 diabetes mellitus without complications: Secondary | ICD-10-CM | POA: Insufficient documentation

## 2022-10-31 DIAGNOSIS — Z01812 Encounter for preprocedural laboratory examination: Secondary | ICD-10-CM | POA: Diagnosis not present

## 2022-10-31 DIAGNOSIS — G459 Transient cerebral ischemic attack, unspecified: Secondary | ICD-10-CM | POA: Diagnosis not present

## 2022-10-31 DIAGNOSIS — Z794 Long term (current) use of insulin: Secondary | ICD-10-CM | POA: Diagnosis not present

## 2022-10-31 DIAGNOSIS — Z01818 Encounter for other preprocedural examination: Secondary | ICD-10-CM

## 2022-10-31 DIAGNOSIS — I10 Essential (primary) hypertension: Secondary | ICD-10-CM | POA: Insufficient documentation

## 2022-10-31 HISTORY — DX: Essential (primary) hypertension: I10

## 2022-10-31 HISTORY — DX: Malignant (primary) neoplasm, unspecified: C80.1

## 2022-10-31 HISTORY — DX: Transient cerebral ischemic attack, unspecified: G45.9

## 2022-10-31 HISTORY — DX: Cardiac arrhythmia, unspecified: I49.9

## 2022-10-31 LAB — HEMOGLOBIN A1C
Hgb A1c MFr Bld: 5.4 % (ref 4.8–5.6)
Mean Plasma Glucose: 108.28 mg/dL

## 2022-10-31 LAB — CBC
HCT: 43 % (ref 36.0–46.0)
Hemoglobin: 13.4 g/dL (ref 12.0–15.0)
MCH: 28 pg (ref 26.0–34.0)
MCHC: 31.2 g/dL (ref 30.0–36.0)
MCV: 90 fL (ref 80.0–100.0)
Platelets: 359 10*3/uL (ref 150–400)
RBC: 4.78 MIL/uL (ref 3.87–5.11)
RDW: 14.9 % (ref 11.5–15.5)
WBC: 13.6 10*3/uL — ABNORMAL HIGH (ref 4.0–10.5)
nRBC: 0 % (ref 0.0–0.2)

## 2022-10-31 LAB — BASIC METABOLIC PANEL
Anion gap: 8 (ref 5–15)
BUN: 21 mg/dL (ref 8–23)
CO2: 27 mmol/L (ref 22–32)
Calcium: 8.7 mg/dL — ABNORMAL LOW (ref 8.9–10.3)
Chloride: 102 mmol/L (ref 98–111)
Creatinine, Ser: 0.8 mg/dL (ref 0.44–1.00)
GFR, Estimated: >60 mL/min (ref >60)
Glucose, Bld: 112 mg/dL — ABNORMAL HIGH (ref 70–99)
Potassium: 3.8 mmol/L (ref 3.5–5.1)
Sodium: 137 mmol/L (ref 135–145)

## 2022-10-31 LAB — GLUCOSE, CAPILLARY: Glucose-Capillary: 116 mg/dL — ABNORMAL HIGH (ref 70–99)

## 2022-10-31 LAB — SURGICAL PCR SCREEN
MRSA, PCR: NEGATIVE
Staphylococcus aureus: POSITIVE — AB

## 2022-10-31 NOTE — Progress Notes (Signed)
For Short Stay: COVID SWAB appointment date:  Bowel Prep reminder:   For Anesthesia: PCP - Dr. Christell Constant [facility] Cardiologist - Dr. Donato Schultz Clearance: Eligha Bridegroom. : 09/30/22 Chest x-ray - 12/12/21 EKG - 12/13/21 Stress Test -  ECHO - 12/13/21 Cardiac Cath -  Pacemaker/ICD device last checked: Pacemaker orders received: Device Rep notified:  Spinal Cord Stimulator: n/C  Sleep Study - Yes CPAP - NO  Fasting Blood Sugar - 120's Checks Blood Sugar ____2_ times a day Date and result of last Hgb A1c-  Last dose of GLP1 agonist- Mounjaro: 10/16/22 GLP1 instructions:   Last dose of SGLT-2 inhibitors- N/A SGLT-2 instructions:   Blood Thinner Instructions: Aspirin Instructions: Last Dose:  Activity level: Can go up a flight of stairs and activities of daily living without stopping and without chest pain and/or shortness of breath   Able to exercise without chest pain and/or shortness of breath  Anesthesia review: Hx: HTN,DIA,TIA's,PAC's,Palpitations,PVC's.  Patient denies shortness of breath, fever, cough and chest pain at PAT appointment   Patient verbalized understanding of instructions that were given to them at the PAT appointment. Patient was also instructed that they will need to review over the PAT instructions again at home before surgery.

## 2022-10-31 NOTE — Progress Notes (Signed)
PCR: + STAPH °

## 2022-11-01 ENCOUNTER — Encounter: Payer: Medicare HMO | Admitting: Internal Medicine

## 2022-11-01 NOTE — Progress Notes (Addendum)
Anesthesia Chart Review   Case: [id number] Date/Time: 11/04/22 1215   Procedure: TOTAL KNEE ARTHROPLASTY (Left: Knee)   Anesthesia type: Choice   Pre-op diagnosis: Left knee degenerative joint disease   Location: [facility] ROOM 01 / [facility]   Surgeons: Jene EveryBeane, Jeffrey, MD       DISCUSSION:66 y.o. never smoker with h/o PONV, hypothyroidism, HTN, DM II, left knee djd scheduled for above procedure 11/04/22 with Dr. Jene EveryJeffrey Beane.   Pt last seen by cardiology 09/30/2022. Per OV note, " Preoperative Cardiovascular Risk Assessment: According to the Revised Cardiac Risk Index (RCRI), her Perioperative Risk of Major Cardiac Event is (%): 0.9. Her Functional Capacity in METs is: 7.59 according to the Duke Activity Status Index (DASI). The patient is doing well from a cardiac perspective. Therefore, based on ACC/AHA guidelines, the patient would be at acceptable risk for the planned procedure without further cardiovascular testing."  Pt reports to PAT nurse [facility].  Anticipate pt can proceed with planned procedure barring acute status change.   VS: BP (!) 144/91   Pulse (!) 52   Temp 36.7 C (Oral)   Ht 5' 6.5" (1.689 m)   Wt 100.2 kg   SpO2 95%   BMI 35.14 kg/m   PROVIDERS: Lewis Moccasinewey, Elizabeth R, MD is PCP   Cardiologist - Dr. Donato SchultzMark Skains  LABS: Labs reviewed: Acceptable for surgery. (all labs ordered are listed, but only abnormal results are displayed)  Labs Reviewed  SURGICAL PCR SCREEN - Abnormal; Notable for the following components:      Result Value   Staphylococcus aureus POSITIVE (*)    All other components within normal limits  BASIC METABOLIC PANEL - Abnormal; Notable for the following components:   Glucose, Bld 112 (*)    Calcium 8.7 (*)    All other components within normal limits  CBC - Abnormal; Notable for the following components:   WBC 13.6 (*)    All other components within normal limits  GLUCOSE, CAPILLARY - Abnormal; Notable for the following components:    Glucose-Capillary 116 (*)    All other components within normal limits  HEMOGLOBIN A1C     IMAGES:   EKG:   CV: Echo 12/13/21 1. Left ventricular ejection fraction, by estimation, is 70 to 75%. The  left ventricle has hyperdynamic function. The left ventricle has no  regional wall motion abnormalities. Left ventricular diastolic parameters  were normal.   2. Right ventricular systolic function is normal. The right ventricular  size is normal. There is normal pulmonary artery systolic pressure.   3. The mitral valve is grossly normal. No evidence of mitral valve  regurgitation. No evidence of mitral stenosis.   4. The aortic valve is tricuspid. Aortic valve regurgitation is not  visualized. No aortic stenosis is present.   5. The inferior vena cava is normal in size with greater than 50%  respiratory variability, suggesting right atrial pressure of 3 mmHg.  Past Medical History:  Diagnosis Date   Anxiety    Arthritis    Balance problems    Cancer    Chronic anxiety    Chronic depression    Chronic diarrhea    after gallbladder surgery   Chronic low back pain    Chronic seasonal allergic rhinitis    Complication of anesthesia    pt states has difficulty with being put to sleep    Dysrhythmia    Exogenous obesity    EXTERNAL HEMORRHOIDS 06/26/2007   Annotation: and internal Qualifier:  Diagnosis of  By: Cheri Guppy     Falls    Family history of adverse reaction to anesthesia    pts daughter has N&V   Family history of breast cancer    Family history of prostate cancer    Fatigue    Hemorrhoids    History of blood clots    History of blood transfusion    2006   History of bronchitis    History of chicken pox    History of frequent urinary tract infections    History of gallstones    History of jaundice    History of kidney stones    History of measles as a child    History of mumps as a child    Hypertension    Hypothyroidism    Insomnia    Night  sweats    Personal history of radiation therapy    Pneumonia    hx of    Polycythemia    PONV (postoperative nausea and vomiting)    Poor sleep    chronic poor quality sleep wit snoring, sleep study in Nov 2013 showed mild central sleep apnea with hypoxia felt due to narcotics, no significant OSA or RLS   Thyroid disease    TIA (transient ischemic attack)    Tinnitus    Type II diabetes mellitus 11/2016    Past Surgical History:  Procedure Laterality Date   ABDOMINAL HYSTERECTOMY     APPENDECTOMY  1972   BACK SURGERY     BREAST BIOPSY Right    years ago- unsure when   BREAST LUMPECTOMY     BREAST LUMPECTOMY WITH RADIOACTIVE SEED LOCALIZATION Right 03/25/2021   Procedure: RIGHT BREAST LUMPECTOMY WITH RADIOACTIVE SEED LOCALIZATION X2;  Surgeon: Almond Lint, MD;  Location: [facility];  Service: General;  Laterality: Right;   CHOLECYSTECTOMY  1987   COLONOSCOPY  11/2011   showed mild diverticulosis, internal hemorrhoids, and no polyps   ENDOSCOPY with esophageal dilatation  03/14/2016   KNEE ARTHROSCOPY Right 01/07/2016   Procedure: ARTHROSCOPY RIGHT KNEE, DEBRIDEMENT OF ARTHROFIBROSIS, AND EXAM UNDER ANESTHESIA;  Surgeon: Jene Every, MD;  Location: [facility];  Service: Orthopedics;  Laterality: Right;   KNEE ARTHROSCOPY Right 10/04/2017   Procedure: Right knee arthroscopy, evaluation under anesthesia, lysis of adhesions;  Surgeon: Jene Every, MD;  Location: [facility];  Service: Orthopedics;  Laterality: Right;  60 mins   KNEE SURGERY     L3/4  fusion  2004   L3/4 discectomy  2003   L4/5 discectomy  2001   L4/5 fusion  2002   left foot surgery      to repair break - 2002   left knee meniscectomy  2012   lumbar spine ESI  12/2017   menicus tear     bilat;    RE-EXCISION OF BREAST LUMPECTOMY Right 04/29/2021   Procedure: RE-EXCISION OF RIGHT BREAST LUMPECTOMY;  Surgeon: Almond Lint, MD;  Location: [facility];  Service: General;  Laterality: Right;   right knee arthroscopy  12/2015    Dr. Shelle Iron   right knee meniscectomy  2012   TONSILLECTOMY     TOTAL KNEE ARTHROPLASTY Right 03/27/2015   Procedure: RIGHT TOTAL KNEE ARTHROPLASTY;  Surgeon: Jene Every, MD;  Location: [facility];  Service: Orthopedics;  Laterality: Right;   total knee replacement Right 03/2015   VAGINAL HYSTERECTOMY  1996   with ovaries intact, accidental bladder laceration   WRIST SURGERY Right 11/2016   Dr. Amanda Pea    MEDICATIONS:  aspirin EC 81 MG tablet   baclofen (LIORESAL) 10 MG tablet   escitalopram (LEXAPRO) 20 MG tablet   levothyroxine (SYNTHROID) 100 MCG tablet   oxybutynin (DITROPAN-XL) 10 MG 24 hr tablet   oxyCODONE (OXY IR/ROXICODONE) 5 MG immediate release tablet   Oxycodone HCl 10 MG TABS   Prenatal Vit-Fe Fumarate-FA (PRENATAL MULTIVITAMIN) TABS tablet   propranolol ER (INDERAL LA) 60 MG 24 hr capsule   tirzepatide (MOUNJARO) 5 MG/0.5ML Pen   tirzepatide (MOUNJARO) 7.5 MG/0.5ML Pen   topiramate (TOPAMAX) 25 MG tablet   No current facility-administered medications for this encounter.     Jodell Cipro Ward, PA-C [facility] [phone]

## 2022-11-01 NOTE — Anesthesia Preprocedure Evaluation (Addendum)
Anesthesia Evaluation  Patient identified by MRN, date of birth, ID band Patient awake    Reviewed: Allergy & Precautions, NPO status , Patient's Chart, lab work & pertinent test results, reviewed documented beta blocker date and time   History of Anesthesia Complications (+) PONV and history of anesthetic complications  Airway Mallampati: II  TM Distance: >3 FB Neck ROM: Full    Dental no notable dental hx.    Pulmonary    Pulmonary exam normal        Cardiovascular hypertension, Pt. on home beta blockers Normal cardiovascular exam     Neuro/Psych   Anxiety Depression    TIA   GI/Hepatic negative GI ROS, Neg liver ROS,,,  Endo/Other  diabetes (on Mounjaro), Type 2Hypothyroidism    Renal/GU negative Renal ROS     Musculoskeletal  (+) Arthritis ,    Abdominal   Peds  Hematology negative hematology ROS (+)   Anesthesia Other Findings Day of surgery medications reviewed with patient.  Reproductive/Obstetrics                              Anesthesia Physical Anesthesia Plan  ASA: 3  Anesthesia Plan: Spinal   Post-op Pain Management: Tylenol PO (pre-op)* and Regional block*   Induction:   PONV Risk Score and Plan: 4 or greater and Treatment may vary due to age or medical condition, Ondansetron, Propofol infusion, Dexamethasone and Midazolam  Airway Management Planned: Natural Airway and Simple Face Mask  Additional Equipment: None  Intra-op Plan:   Post-operative Plan:   Informed Consent: I have reviewed the patients History and Physical, chart, labs and discussed the procedure including the risks, benefits and alternatives for the proposed anesthesia with the patient or authorized representative who has indicated his/her understanding and acceptance.       Plan Discussed with: CRNA  Anesthesia Plan Comments: (See PAT note 10/31/2022)        Anesthesia Quick  Evaluation

## 2022-11-02 DIAGNOSIS — F331 Major depressive disorder, recurrent, moderate: Secondary | ICD-10-CM | POA: Diagnosis not present

## 2022-11-02 DIAGNOSIS — I10 Essential (primary) hypertension: Secondary | ICD-10-CM | POA: Diagnosis not present

## 2022-11-02 DIAGNOSIS — G4700 Insomnia, unspecified: Secondary | ICD-10-CM | POA: Diagnosis not present

## 2022-11-02 DIAGNOSIS — F411 Generalized anxiety disorder: Secondary | ICD-10-CM | POA: Diagnosis not present

## 2022-11-02 DIAGNOSIS — N3000 Acute cystitis without hematuria: Secondary | ICD-10-CM | POA: Diagnosis not present

## 2022-11-04 ENCOUNTER — Encounter (HOSPITAL_COMMUNITY)
Admission: RE | Disposition: A | Discharge status: Home / Self Care | Payer: Self-pay | Source: Home / Self Care | Attending: Specialist

## 2022-11-04 ENCOUNTER — Ambulatory Visit (HOSPITAL_COMMUNITY): Payer: Medicare Other | Admitting: Certified Registered Nurse Anesthetist

## 2022-11-04 ENCOUNTER — Ambulatory Visit (HOSPITAL_COMMUNITY)
Admission: RE | Admit: 2022-11-04 | Discharge: 2022-11-05 | Disposition: A | Discharge status: Home / Self Care | Payer: Medicare Other | Attending: Specialist | Admitting: Specialist

## 2022-11-04 ENCOUNTER — Encounter (HOSPITAL_COMMUNITY): Payer: Self-pay | Admitting: Specialist

## 2022-11-04 ENCOUNTER — Ambulatory Visit (HOSPITAL_COMMUNITY): Payer: Medicare Other

## 2022-11-04 ENCOUNTER — Ambulatory Visit (HOSPITAL_COMMUNITY): Payer: Medicare Other | Admitting: Physician Assistant

## 2022-11-04 ENCOUNTER — Other Ambulatory Visit: Payer: Self-pay

## 2022-11-04 DIAGNOSIS — I10 Essential (primary) hypertension: Secondary | ICD-10-CM | POA: Diagnosis not present

## 2022-11-04 DIAGNOSIS — E119 Type 2 diabetes mellitus without complications: Secondary | ICD-10-CM | POA: Diagnosis not present

## 2022-11-04 DIAGNOSIS — Z7985 Long-term (current) use of injectable non-insulin antidiabetic drugs: Secondary | ICD-10-CM | POA: Insufficient documentation

## 2022-11-04 DIAGNOSIS — Z96652 Presence of left artificial knee joint: Secondary | ICD-10-CM

## 2022-11-04 DIAGNOSIS — M1712 Unilateral primary osteoarthritis, left knee: Secondary | ICD-10-CM

## 2022-11-04 DIAGNOSIS — Z471 Aftercare following joint replacement surgery: Secondary | ICD-10-CM | POA: Diagnosis not present

## 2022-11-04 DIAGNOSIS — Z8673 Personal history of transient ischemic attack (TIA), and cerebral infarction without residual deficits: Secondary | ICD-10-CM | POA: Diagnosis not present

## 2022-11-04 DIAGNOSIS — Z79891 Long term (current) use of opiate analgesic: Secondary | ICD-10-CM | POA: Insufficient documentation

## 2022-11-04 DIAGNOSIS — M21162 Varus deformity, not elsewhere classified, left knee: Secondary | ICD-10-CM | POA: Diagnosis not present

## 2022-11-04 DIAGNOSIS — G8918 Other acute postprocedural pain: Secondary | ICD-10-CM | POA: Diagnosis not present

## 2022-11-04 HISTORY — PX: TOTAL KNEE ARTHROPLASTY: SHX125

## 2022-11-04 LAB — GLUCOSE, CAPILLARY
Glucose-Capillary: 101 mg/dL — ABNORMAL HIGH (ref 70–99)
Glucose-Capillary: 120 mg/dL — ABNORMAL HIGH (ref 70–99)
Glucose-Capillary: 296 mg/dL — ABNORMAL HIGH (ref 70–99)

## 2022-11-04 SURGERY — ARTHROPLASTY, KNEE, TOTAL
Anesthesia: Spinal | Site: Knee | Laterality: Left

## 2022-11-04 MED ORDER — INSULIN ASPART 100 UNIT/ML IJ SOLN
0.0000 [IU] | Freq: Three times a day (TID) | INTRAMUSCULAR | Status: DC
  Administered 2022-11-05 (×2): 3 [IU] via SUBCUTANEOUS

## 2022-11-04 MED ORDER — PRENATAL MULTIVITAMIN CH
1.0000 | ORAL_TABLET | Freq: Every day | ORAL | Status: DC
  Administered 2022-11-05: 1 via ORAL
  Filled 2022-11-04: qty 1

## 2022-11-04 MED ORDER — CEFAZOLIN SODIUM-DEXTROSE 2-4 GM/100ML-% IV SOLN
2.0000 g | INTRAVENOUS | Status: AC
  Administered 2022-11-04: 2 g via INTRAVENOUS
  Filled 2022-11-04: qty 100

## 2022-11-04 MED ORDER — METHOCARBAMOL 500 MG PO TABS
500.0000 mg | ORAL_TABLET | Freq: Four times a day (QID) | ORAL | Status: DC | PRN
  Administered 2022-11-04 – 2022-11-05 (×3): 500 mg via ORAL
  Filled 2022-11-04 (×3): qty 1

## 2022-11-04 MED ORDER — METHOCARBAMOL 500 MG PO TABS: 500.0000 mg | ORAL_TABLET | Freq: Three times a day (TID) | ORAL | 1 refills | Status: DC | PRN

## 2022-11-04 MED ORDER — MAGNESIUM CITRATE PO SOLN: 1.0000 | Freq: Once | ORAL | Status: DC | PRN

## 2022-11-04 MED ORDER — OXYCODONE HCL 5 MG PO TABS: 5.0000 mg | ORAL_TABLET | ORAL | Status: DC | PRN

## 2022-11-04 MED ORDER — OXYCODONE HCL 10 MG PO TABS: 10.0000 mg | ORAL_TABLET | ORAL | 0 refills | Status: AC | PRN

## 2022-11-04 MED ORDER — ONDANSETRON HCL 4 MG/2ML IJ SOLN
4.0000 mg | Freq: Four times a day (QID) | INTRAMUSCULAR | Status: DC | PRN
  Administered 2022-11-05 (×2): 4 mg via INTRAVENOUS
  Filled 2022-11-04 (×2): qty 2

## 2022-11-04 MED ORDER — BUPIVACAINE-EPINEPHRINE 0.5% -1:200000 IJ SOLN
INTRAMUSCULAR | Status: DC | PRN
  Administered 2022-11-04: 30 mL

## 2022-11-04 MED ORDER — CHLORHEXIDINE GLUCONATE 0.12 % MT SOLN
15.0000 mL | Freq: Once | OROMUCOSAL | Status: AC
  Administered 2022-11-04: 15 mL via OROMUCOSAL

## 2022-11-04 MED ORDER — DEXAMETHASONE SODIUM PHOSPHATE 10 MG/ML IJ SOLN
INTRAMUSCULAR | Status: DC | PRN
  Administered 2022-11-04: 4 mg via INTRAVENOUS

## 2022-11-04 MED ORDER — ASPIRIN 81 MG PO CHEW
81.0000 mg | CHEWABLE_TABLET | Freq: Two times a day (BID) | ORAL | Status: DC
  Administered 2022-11-05: 81 mg via ORAL
  Filled 2022-11-04: qty 1

## 2022-11-04 MED ORDER — MENTHOL 3 MG MT LOZG: 1.0000 | LOZENGE | OROMUCOSAL | Status: DC | PRN

## 2022-11-04 MED ORDER — 0.9 % SODIUM CHLORIDE (POUR BTL) OPTIME
TOPICAL | Status: DC | PRN
  Administered 2022-11-04: 1000 mL

## 2022-11-04 MED ORDER — DEXAMETHASONE SODIUM PHOSPHATE 10 MG/ML IJ SOLN
INTRAMUSCULAR | Status: AC
  Filled 2022-11-04: qty 1

## 2022-11-04 MED ORDER — PROPOFOL 10 MG/ML IV BOLUS
INTRAVENOUS | Status: AC
  Filled 2022-11-04: qty 20

## 2022-11-04 MED ORDER — TOPIRAMATE 25 MG PO TABS: 25.0000 mg | ORAL_TABLET | Freq: Two times a day (BID) | ORAL | Status: DC

## 2022-11-04 MED ORDER — ACETAMINOPHEN 10 MG/ML IV SOLN
1000.0000 mg | INTRAVENOUS | Status: AC
  Administered 2022-11-04: 1000 mg via INTRAVENOUS
  Filled 2022-11-04: qty 100

## 2022-11-04 MED ORDER — OXYBUTYNIN CHLORIDE ER 5 MG PO TB24
10.0000 mg | ORAL_TABLET | Freq: Every day | ORAL | Status: DC
  Administered 2022-11-04: 10 mg via ORAL
  Filled 2022-11-04: qty 2

## 2022-11-04 MED ORDER — AMISULPRIDE (ANTIEMETIC) 5 MG/2ML IV SOLN: 10.0000 mg | Freq: Once | INTRAVENOUS | Status: DC | PRN

## 2022-11-04 MED ORDER — CEFAZOLIN SODIUM-DEXTROSE 2-4 GM/100ML-% IV SOLN
2.0000 g | Freq: Four times a day (QID) | INTRAVENOUS | Status: AC
  Administered 2022-11-04 – 2022-11-05 (×2): 2 g via INTRAVENOUS
  Filled 2022-11-04 (×2): qty 100

## 2022-11-04 MED ORDER — ORAL CARE MOUTH RINSE: 15.0000 mL | Freq: Once | OROMUCOSAL | Status: AC

## 2022-11-04 MED ORDER — RISAQUAD PO CAPS
1.0000 | ORAL_CAPSULE | Freq: Every day | ORAL | Status: DC
  Administered 2022-11-04 – 2022-11-05 (×2): 1 via ORAL
  Filled 2022-11-04 (×2): qty 1

## 2022-11-04 MED ORDER — OXYCODONE HCL 5 MG PO TABS: 5.0000 mg | ORAL_TABLET | Freq: Once | ORAL | Status: DC | PRN

## 2022-11-04 MED ORDER — MUPIROCIN 2 % EX OINT
1.0000 | TOPICAL_OINTMENT | Freq: Once | CUTANEOUS | Status: AC
  Administered 2022-11-04: 1 via TOPICAL
  Filled 2022-11-04: qty 22

## 2022-11-04 MED ORDER — BUPIVACAINE-EPINEPHRINE (PF) 0.5% -1:200000 IJ SOLN
INTRAMUSCULAR | Status: DC | PRN
  Administered 2022-11-04: 15 mL via PERINEURAL

## 2022-11-04 MED ORDER — HYDROMORPHONE HCL 1 MG/ML IJ SOLN
0.5000 mg | INTRAMUSCULAR | Status: DC | PRN
  Administered 2022-11-04 – 2022-11-05 (×3): 1 mg via INTRAVENOUS
  Filled 2022-11-04 (×3): qty 1

## 2022-11-04 MED ORDER — DOCUSATE SODIUM 100 MG PO CAPS: 100.0000 mg | ORAL_CAPSULE | Freq: Two times a day (BID) | ORAL | 1 refills | Status: DC | PRN

## 2022-11-04 MED ORDER — POLYETHYLENE GLYCOL 3350 17 G PO PACK: 17.0000 g | PACK | Freq: Every day | ORAL | 0 refills | Status: DC

## 2022-11-04 MED ORDER — BUPIVACAINE-EPINEPHRINE (PF) 0.5% -1:200000 IJ SOLN
INTRAMUSCULAR | Status: AC
  Filled 2022-11-04: qty 30

## 2022-11-04 MED ORDER — BUPIVACAINE IN DEXTROSE 0.75-8.25 % IT SOLN
INTRATHECAL | Status: DC | PRN
  Administered 2022-11-04: 2 mL via INTRATHECAL

## 2022-11-04 MED ORDER — DIPHENHYDRAMINE HCL 12.5 MG/5ML PO ELIX: 12.5000 mg | ORAL_SOLUTION | ORAL | Status: DC | PRN

## 2022-11-04 MED ORDER — ALUM & MAG HYDROXIDE-SIMETH 200-200-20 MG/5ML PO SUSP: 30.0000 mL | ORAL | Status: DC | PRN

## 2022-11-04 MED ORDER — PROPOFOL 1000 MG/100ML IV EMUL
INTRAVENOUS | Status: AC
  Filled 2022-11-04: qty 100

## 2022-11-04 MED ORDER — BUPIVACAINE LIPOSOME 1.3 % IJ SUSP
INTRAMUSCULAR | Status: DC | PRN
  Administered 2022-11-04: 20 mL

## 2022-11-04 MED ORDER — PROPRANOLOL HCL ER 60 MG PO CP24
60.0000 mg | ORAL_CAPSULE | Freq: Every day | ORAL | Status: DC
  Filled 2022-11-04: qty 1

## 2022-11-04 MED ORDER — PROPOFOL 10 MG/ML IV BOLUS
INTRAVENOUS | Status: DC | PRN
  Administered 2022-11-04 (×4): 20 mg via INTRAVENOUS

## 2022-11-04 MED ORDER — DOCUSATE SODIUM 100 MG PO CAPS
100.0000 mg | ORAL_CAPSULE | Freq: Two times a day (BID) | ORAL | Status: DC
  Administered 2022-11-04 – 2022-11-05 (×2): 100 mg via ORAL
  Filled 2022-11-04 (×2): qty 1

## 2022-11-04 MED ORDER — ESCITALOPRAM OXALATE 20 MG PO TABS
20.0000 mg | ORAL_TABLET | Freq: Every day | ORAL | Status: DC
  Administered 2022-11-05: 20 mg via ORAL
  Filled 2022-11-04: qty 1

## 2022-11-04 MED ORDER — BUPIVACAINE LIPOSOME 1.3 % IJ SUSP
INTRAMUSCULAR | Status: AC
  Filled 2022-11-04: qty 20

## 2022-11-04 MED ORDER — SODIUM CHLORIDE (PF) 0.9 % IJ SOLN
INTRAMUSCULAR | Status: AC
  Filled 2022-11-04: qty 20

## 2022-11-04 MED ORDER — LACTATED RINGERS IV SOLN: INTRAVENOUS | Status: DC

## 2022-11-04 MED ORDER — TRANEXAMIC ACID-NACL 1000-0.7 MG/100ML-% IV SOLN
1000.0000 mg | INTRAVENOUS | Status: AC
  Administered 2022-11-04: 1000 mg via INTRAVENOUS
  Filled 2022-11-04: qty 100

## 2022-11-04 MED ORDER — OXYCODONE HCL 5 MG/5ML PO SOLN: 5.0000 mg | Freq: Once | ORAL | Status: DC | PRN

## 2022-11-04 MED ORDER — BISACODYL 5 MG PO TBEC: 5.0000 mg | DELAYED_RELEASE_TABLET | Freq: Every day | ORAL | Status: DC | PRN

## 2022-11-04 MED ORDER — OXYCODONE HCL 5 MG PO TABS
10.0000 mg | ORAL_TABLET | ORAL | Status: DC | PRN
  Administered 2022-11-04 – 2022-11-05 (×5): 15 mg via ORAL
  Filled 2022-11-04 (×5): qty 3

## 2022-11-04 MED ORDER — LEVOTHYROXINE SODIUM 100 MCG PO TABS
100.0000 ug | ORAL_TABLET | Freq: Every day | ORAL | Status: DC
  Administered 2022-11-05: 100 ug via ORAL
  Filled 2022-11-04: qty 1

## 2022-11-04 MED ORDER — ONDANSETRON HCL 4 MG PO TABS
4.0000 mg | ORAL_TABLET | Freq: Four times a day (QID) | ORAL | Status: DC | PRN
  Administered 2022-11-05: 4 mg via ORAL
  Filled 2022-11-04: qty 1

## 2022-11-04 MED ORDER — EPHEDRINE SULFATE-NACL 50-0.9 MG/10ML-% IV SOSY
PREFILLED_SYRINGE | INTRAVENOUS | Status: DC | PRN
  Administered 2022-11-04: 5 mg via INTRAVENOUS
  Administered 2022-11-04 (×2): 10 mg via INTRAVENOUS

## 2022-11-04 MED ORDER — METOCLOPRAMIDE HCL 5 MG/ML IJ SOLN: 5.0000 mg | Freq: Three times a day (TID) | INTRAMUSCULAR | Status: DC | PRN

## 2022-11-04 MED ORDER — KCL IN DEXTROSE-NACL 20-5-0.9 MEQ/L-%-% IV SOLN
INTRAVENOUS | Status: DC
  Filled 2022-11-04 (×2): qty 1000

## 2022-11-04 MED ORDER — BACLOFEN 10 MG PO TABS: 10.0000 mg | ORAL_TABLET | Freq: Three times a day (TID) | ORAL | Status: DC

## 2022-11-04 MED ORDER — FENTANYL CITRATE PF 50 MCG/ML IJ SOSY
50.0000 ug | PREFILLED_SYRINGE | INTRAMUSCULAR | Status: DC
  Administered 2022-11-04: 50 ug via INTRAVENOUS
  Filled 2022-11-04: qty 2

## 2022-11-04 MED ORDER — FENTANYL CITRATE PF 50 MCG/ML IJ SOSY: 25.0000 ug | PREFILLED_SYRINGE | INTRAMUSCULAR | Status: DC | PRN

## 2022-11-04 MED ORDER — SODIUM CHLORIDE 0.9% FLUSH
INTRAVENOUS | Status: DC | PRN
  Administered 2022-11-04: 20 mL

## 2022-11-04 MED ORDER — SODIUM CHLORIDE 0.9 % IR SOLN
Status: DC | PRN
  Administered 2022-11-04: 1000 mL

## 2022-11-04 MED ORDER — OXYCODONE HCL 5 MG PO TABS
ORAL_TABLET | ORAL | Status: AC
  Administered 2022-11-04: 10 mg via ORAL
  Filled 2022-11-04: qty 2

## 2022-11-04 MED ORDER — ONDANSETRON HCL 4 MG/2ML IJ SOLN
INTRAMUSCULAR | Status: DC | PRN
  Administered 2022-11-04: 4 mg via INTRAVENOUS

## 2022-11-04 MED ORDER — CLONIDINE HCL (ANALGESIA) 100 MCG/ML EP SOLN
EPIDURAL | Status: DC | PRN
  Administered 2022-11-04: 100 ug

## 2022-11-04 MED ORDER — PHENOL 1.4 % MT LIQD: 1.0000 | OROMUCOSAL | Status: DC | PRN

## 2022-11-04 MED ORDER — POLYETHYLENE GLYCOL 3350 17 G PO PACK
17.0000 g | PACK | Freq: Every day | ORAL | Status: DC
  Administered 2022-11-04 – 2022-11-05 (×2): 17 g via ORAL
  Filled 2022-11-04 (×2): qty 1

## 2022-11-04 MED ORDER — METHOCARBAMOL 500 MG IVPB - SIMPLE MED: 500.0000 mg | Freq: Four times a day (QID) | INTRAVENOUS | Status: DC | PRN

## 2022-11-04 MED ORDER — PROPOFOL 500 MG/50ML IV EMUL
INTRAVENOUS | Status: DC | PRN
  Administered 2022-11-04: 75 ug/kg/min via INTRAVENOUS

## 2022-11-04 MED ORDER — MIDAZOLAM HCL 2 MG/2ML IJ SOLN
1.0000 mg | INTRAMUSCULAR | Status: DC
  Filled 2022-11-04: qty 2

## 2022-11-04 MED ORDER — METOCLOPRAMIDE HCL 5 MG PO TABS
5.0000 mg | ORAL_TABLET | Freq: Three times a day (TID) | ORAL | Status: DC | PRN
  Administered 2022-11-05: 10 mg via ORAL
  Filled 2022-11-04: qty 2

## 2022-11-04 MED ORDER — ONDANSETRON HCL 4 MG/2ML IJ SOLN
INTRAMUSCULAR | Status: AC
  Filled 2022-11-04: qty 2

## 2022-11-04 MED ORDER — ASPIRIN 81 MG PO TBEC: 81.0000 mg | DELAYED_RELEASE_TABLET | Freq: Two times a day (BID) | ORAL | 2 refills | Status: DC

## 2022-11-04 MED ORDER — STERILE WATER FOR IRRIGATION IR SOLN
Status: DC | PRN
  Administered 2022-11-04: 2000 mL

## 2022-11-04 SURGICAL SUPPLY — 80 items
AGENT HMST SPONGE THK3/8 (HEMOSTASIS)
ATTUNE PSFEM LTSZ5 NARCEM KNEE (Femur) IMPLANT
ATTUNE PSRP INSR SZ5 6 KNEE (Insert) IMPLANT
BAG COUNTER SPONGE SURGICOUNT (BAG) IMPLANT
BAG DECANTER FOR FLEXI CONT (MISCELLANEOUS) ×1 IMPLANT
BAG SPEC THK2 15X12 ZIP CLS (MISCELLANEOUS)
BAG SPNG CNTER NS LX DISP (BAG)
BAG ZIPLOCK 12X15 (MISCELLANEOUS) IMPLANT
BASE TIBIAL ROT PLAT SZ 5 KNEE (Knees) IMPLANT
BLADE SAG 13.0X1.37X90 (BLADE) IMPLANT
BLADE SAW SGTL 11.0X1.19X90.0M (BLADE) ×1 IMPLANT
BLADE SAW SGTL 13.0X1.19X90.0M (BLADE) ×1 IMPLANT
BLADE SURG SZ10 CARB STEEL (BLADE) ×2 IMPLANT
BNDG CMPR 5X4 KNIT ELC UNQ LF (GAUZE/BANDAGES/DRESSINGS) ×1
BNDG CMPR 5X62 HK CLSR LF (GAUZE/BANDAGES/DRESSINGS) ×1
BNDG CMPR MED 10X6 ELC LF (GAUZE/BANDAGES/DRESSINGS) ×1
BNDG ELASTIC 4INX 5YD STR LF (GAUZE/BANDAGES/DRESSINGS) ×1 IMPLANT
BNDG ELASTIC 4X5.8 VLCR STR LF (GAUZE/BANDAGES/DRESSINGS) IMPLANT
BNDG ELASTIC 6INX 5YD STR LF (GAUZE/BANDAGES/DRESSINGS) ×1 IMPLANT
BNDG ELASTIC 6X10 VLCR STRL LF (GAUZE/BANDAGES/DRESSINGS) IMPLANT
BOWL SMART MIX CTS (DISPOSABLE) ×1 IMPLANT
BSPLAT TIB 5 CMNT ROT PLAT STR (Knees) ×1 IMPLANT
CEMENT HV SMART SET (Cement) ×2 IMPLANT
CLSR STERI-STRIP ANTIMIC 1/2X4 (GAUZE/BANDAGES/DRESSINGS) IMPLANT
COVER SURGICAL LIGHT HANDLE (MISCELLANEOUS) ×1 IMPLANT
CUFF TOURN SGL QUICK 34 (TOURNIQUET CUFF) ×1
CUFF TRNQT CYL 34X4.125X (TOURNIQUET CUFF) ×1 IMPLANT
DRAPE INCISE IOBAN 66X45 STRL (DRAPES) IMPLANT
DRAPE ORTHO SPLIT 77X108 STRL (DRAPES) ×2
DRAPE SHEET LG 3/4 BI-LAMINATE (DRAPES) ×1 IMPLANT
DRAPE SURG ORHT 6 SPLT 77X108 (DRAPES) ×2 IMPLANT
DRAPE TOP 10253 STERILE (DRAPES) ×1 IMPLANT
DRAPE U-SHAPE 47X51 STRL (DRAPES) ×1 IMPLANT
DRSG AQUACEL AG ADV 3.5X10 (GAUZE/BANDAGES/DRESSINGS) ×1 IMPLANT
DRSG TEGADERM 4X4.75 (GAUZE/BANDAGES/DRESSINGS) IMPLANT
DURAPREP 26ML APPLICATOR (WOUND CARE) ×1 IMPLANT
ELECT BLADE TIP CTD 4 INCH (ELECTRODE) IMPLANT
ELECT REM PT RETURN 15FT ADLT (MISCELLANEOUS) ×1 IMPLANT
EVACUATOR 1/8 PVC DRAIN (DRAIN) IMPLANT
GAUZE SPONGE 2X2 8PLY STRL LF (GAUZE/BANDAGES/DRESSINGS) IMPLANT
GLOVE BIO SURGEON STRL SZ7 (GLOVE) ×1 IMPLANT
GLOVE BIOGEL PI IND STRL 7.0 (GLOVE) ×1 IMPLANT
GLOVE BIOGEL PI IND STRL 8 (GLOVE) ×1 IMPLANT
GLOVE SURG SS PI 8.0 STRL IVOR (GLOVE) ×1 IMPLANT
GOWN STRL REUS W/ TWL XL LVL3 (GOWN DISPOSABLE) ×2 IMPLANT
GOWN STRL REUS W/TWL XL LVL3 (GOWN DISPOSABLE) ×2
HANDPIECE INTERPULSE COAX TIP (DISPOSABLE) ×1
HEMOSTAT SPONGE AVITENE ULTRA (HEMOSTASIS) IMPLANT
HOLDER FOLEY CATH W/STRAP (MISCELLANEOUS) IMPLANT
IMMOBILIZER KNEE 20 (SOFTGOODS) ×1
IMMOBILIZER KNEE 20 THIGH 36 (SOFTGOODS) ×1 IMPLANT
KIT TURNOVER KIT A (KITS) IMPLANT
MANIFOLD NEPTUNE II (INSTRUMENTS) ×1 IMPLANT
NS IRRIG 1000ML POUR BTL (IV SOLUTION) IMPLANT
PACK TOTAL KNEE CUSTOM (KITS) ×1 IMPLANT
PATELLA MEDIAL ATTUN 35MM KNEE (Knees) IMPLANT
PIN STEINMAN FIXATION KNEE (PIN) IMPLANT
PROTECTOR NERVE ULNAR (MISCELLANEOUS) ×1 IMPLANT
SAW OSC TIP CART 19.5X105X1.3 (SAW) IMPLANT
SEALER BIPOLAR AQUA 6.0 (INSTRUMENTS) IMPLANT
SET HNDPC FAN SPRY TIP SCT (DISPOSABLE) ×1 IMPLANT
SOLUTION PRONTOSAN WOUND 350ML (IRRIGATION / IRRIGATOR) ×1 IMPLANT
SPIKE FLUID TRANSFER (MISCELLANEOUS) ×1 IMPLANT
STAPLER VISISTAT (STAPLE) IMPLANT
STRIP CLOSURE SKIN 1/2X4 (GAUZE/BANDAGES/DRESSINGS) IMPLANT
SUT BONE WAX W31G (SUTURE) ×1 IMPLANT
SUT MNCRL AB 4-0 PS2 18 (SUTURE) IMPLANT
SUT STRATAFIX 0 PDS 27 VIOLET (SUTURE) ×1
SUT VIC AB 1 CT1 27 (SUTURE) ×3
SUT VIC AB 1 CT1 27XBRD ANTBC (SUTURE) ×3 IMPLANT
SUT VIC AB 2-0 CT1 27 (SUTURE) ×3
SUT VIC AB 2-0 CT1 TAPERPNT 27 (SUTURE) ×3 IMPLANT
SUTURE STRATFX 0 PDS 27 VIOLET (SUTURE) ×1 IMPLANT
SYR 3ML LL SCALE MARK (SYRINGE) IMPLANT
TIBIAL BASE ROT PLAT SZ 5 KNEE (Knees) ×1 IMPLANT
TRAY FOLEY MTR SLVR 16FR STAT (SET/KITS/TRAYS/PACK) ×1 IMPLANT
TUBE SUCTION HIGH CAP CLEAR NV (SUCTIONS) ×1 IMPLANT
WATER STERILE IRR 1000ML POUR (IV SOLUTION) ×1 IMPLANT
WIPE CHG 2% PREP (PERSONAL CARE ITEMS) ×1 IMPLANT
WRAP KNEE MAXI GEL POST OP (GAUZE/BANDAGES/DRESSINGS) ×1 IMPLANT

## 2022-11-04 NOTE — Anesthesia Procedure Notes (Signed)
Spinal  Patient location during procedure: OR Start time: 11/04/2022 12:58 PM End time: 11/04/2022 1:01 PM Reason for block: surgical anesthesia Staffing Performed: anesthesiologist  Anesthesiologist: Kaylyn Layer, MD Performed by: Kaylyn Layer, MD Authorized by: Eilene Ghazi, MD   Preanesthetic Checklist Completed: patient identified, IV checked, risks and benefits discussed, surgical consent, monitors and equipment checked, pre-op evaluation and timeout performed Spinal Block Patient position: sitting Prep: DuraPrep and site prepped and draped Patient monitoring: continuous pulse ox, blood pressure and heart rate Approach: right paramedian Location: L3-4 Injection technique: single-shot Needle Needle type: Whitacre  Needle gauge: 22 G Needle length: 9 cm Assessment Events: CSF return Additional Notes Risks, benefits, and alternative discussed. Patient gave consent to procedure. Prepped and draped in sitting position. Patient sedated but responsive to voice. First attempt midline unable to access interspinous space, second attempt with Whitacre 22g right paramedian with clear CSF obtained. Positive terminal aspiration. No pain or paraesthesias with injection. Patient tolerated procedure well. Vital signs stable. Amalia Greenhouse, MD

## 2022-11-04 NOTE — Transfer of Care (Signed)
Immediate Anesthesia Transfer of Care Note  Patient: Carol [name]  Procedure(s) Performed: TOTAL KNEE ARTHROPLASTY (Left: Knee)  Patient Location: PACU  Anesthesia Type:Spinal  Level of Consciousness: awake, alert , oriented, and patient cooperative  Airway & Oxygen Therapy: Patient Spontanous Breathing and Patient connected to face mask oxygen  Post-op Assessment: Report given to RN and Post -op Vital signs reviewed and stable  Post vital signs: Reviewed and stable  Last Vitals:  Vitals Value Taken Time  BP 125/69 11/04/22 1545  Temp    Pulse 50 11/04/22 1549  Resp 13 11/04/22 1549  SpO2 98 % 11/04/22 1549  Vitals shown include unvalidated device data.  Last Pain:  Vitals:   11/04/22 1210  TempSrc:   PainSc: 0-No pain      Patients Stated Pain Goal: 5 (11/04/22 1011)  Complications: No notable events documented.

## 2022-11-04 NOTE — Anesthesia Procedure Notes (Signed)
Procedure Name: MAC Date/Time: 11/04/2022 12:55 PM  Performed by: Wynonia Sours, CRNAPre-anesthesia Checklist: Patient identified, Emergency Drugs available, Suction available, Patient being monitored and Timeout performed Patient Re-evaluated:Patient Re-evaluated prior to induction Oxygen Delivery Method: Simple face mask Preoxygenation: Pre-oxygenation with 100% oxygen Induction Type: IV induction Placement Confirmation: positive ETCO2 Dental Injury: Teeth and Oropharynx as per pre-operative assessment

## 2022-11-04 NOTE — Anesthesia Procedure Notes (Signed)
Anesthesia Regional Block: Adductor canal block   Pre-Anesthetic Checklist: , timeout performed,  Correct Patient, Correct Site, Correct Laterality,  Correct Procedure, Correct Position, site marked,  Risks and benefits discussed,  Pre-op evaluation,  At surgeon's request and post-op pain management  Laterality: Left  Prep: Maximum Sterile Barrier Precautions used, chloraprep       Needles:  Injection technique: Single-shot  Needle Type: Echogenic Stimulator Needle     Needle Length: 9cm  Needle Gauge: 22     Additional Needles:   Procedures:,,,, ultrasound used (permanent image in chart),,    Narrative:  Start time: 11/04/2022 11:54 AM End time: 11/04/2022 11:57 AM Injection made incrementally with aspirations every 5 mL.  Performed by: Personally  Anesthesiologist: Kaylyn Layer, MD  Additional Notes: Risks, benefits, and alternative discussed. Patient gave consent for procedure. Patient prepped and draped in sterile fashion. Sedation administered, patient remains easily responsive to voice. Relevant anatomy identified with ultrasound guidance. Local anesthetic given in 5cc increments with no signs or symptoms of intravascular injection. No pain or paraesthesias with injection. Patient monitored throughout procedure with signs of LAST or immediate complications. Tolerated well. Ultrasound image placed in chart.  Carol Greenhouse, MD

## 2022-11-04 NOTE — Anesthesia Postprocedure Evaluation (Signed)
Anesthesia Post Note  Patient: Carol [name]  Procedure(s) Performed: TOTAL KNEE ARTHROPLASTY (Left: Knee)     Patient location during evaluation: PACU Anesthesia Type: Spinal Level of consciousness: awake and alert Pain management: pain level controlled Vital Signs Assessment: post-procedure vital signs reviewed and stable Respiratory status: spontaneous breathing, nonlabored ventilation and respiratory function stable Cardiovascular status: blood pressure returned to baseline Postop Assessment: no apparent nausea or vomiting, spinal receding, no headache and no backache Anesthetic complications: no   No notable events documented.  Last Vitals:  Vitals:   11/04/22 1630 11/04/22 1645  BP: (!) 163/84 (!) 170/80  Pulse: (!) 57 60  Resp: (!) 22   Temp:    SpO2: 100% 100%    Last Pain:  Vitals:   11/04/22 1645  TempSrc:   PainSc: 0-No pain                 Shanda Howells

## 2022-11-04 NOTE — Discharge Instructions (Signed)
Elevate leg above heart 6x a day for each Use knee immobilizer while walking until can SLR x 10 Use knee immobilizer in bed to keep knee in extension Aquacel dressing may remain in place until follow up. May shower with aquacel dressing in place. If the dressing becomes saturated or peels off, you may remove aquacel dressing. Do not remove steri-strips if they are present. Place new dressing with gauze and tape or ACE bandage which should be kept clean and dry and changed daily.  INSTRUCTIONS AFTER JOINT REPLACEMENT   Remove items at home which could result in a fall. This includes throw rugs or furniture in walking pathways ICE to the affected joint every three hours while awake for 30 minutes at a time, for at least the first 3-5 days, and then as needed for pain and swelling.  Continue to use ice for pain and swelling. You may notice swelling that will progress down to the foot and ankle.  This is normal after surgery.  Elevate your leg when you are not up walking on it.   Continue to use the breathing machine you got in the hospital (incentive spirometer) which will help keep your temperature down.  It is common for your temperature to cycle up and down following surgery, especially at night when you are not up moving around and exerting yourself.  The breathing machine keeps your lungs expanded and your temperature down.   DIET:  As you were doing prior to hospitalization, we recommend a well-balanced diet.  DRESSING / WOUND CARE / SHOWERING  You may change your dressing 3-5 days after surgery.  Then change the dressing every day with sterile gauze.  Please use good hand washing techniques before changing the dressing.  Do not use any lotions or creams on the incision until instructed by your surgeon.  ACTIVITY  Increase activity slowly as tolerated, but follow the weight bearing instructions below.   No driving for 6 weeks or until further direction given by your physician.  You  cannot drive while taking narcotics.  No lifting or carrying greater than 10 lbs. until further directed by your surgeon. Avoid periods of inactivity such as sitting longer than an hour when not asleep. This helps prevent blood clots.  You may return to work once you are authorized by your doctor.     WEIGHT BEARING   Weight bearing as tolerated with assist device (walker, cane, etc) as directed, use it as long as suggested by your surgeon or therapist, typically at least 4-6 weeks.   EXERCISES  Results after joint replacement surgery are often greatly improved when you follow the exercise, range of motion and muscle strengthening exercises prescribed by your doctor. Safety measures are also important to protect the joint from further injury. Any time any of these exercises cause you to have increased pain or swelling, decrease what you are doing until you are comfortable again and then slowly increase them. If you have problems or questions, call your caregiver or physical therapist for advice.   Rehabilitation is important following a joint replacement. After just a few days of immobilization, the muscles of the leg can become weakened and shrink (atrophy).  These exercises are designed to build up the tone and strength of the thigh and leg muscles and to improve motion. Often times heat used for twenty to thirty minutes before working out will loosen up your tissues and help with improving the range of motion but do not use heat for  the first two weeks following surgery (sometimes heat can increase post-operative swelling).   These exercises can be done on a training (exercise) mat, on the floor, on a table or on a bed. Use whatever works the best and is most comfortable for you.    Use music or television while you are exercising so that the exercises are a pleasant break in your day. This will make your life better with the exercises acting as a break in your routine that you can look forward  to.   Perform all exercises about fifteen times, three times per day or as directed.  You should exercise both the operative leg and the other leg as well.  Exercises include:   Quad Sets - Tighten up the muscle on the front of the thigh (Quad) and hold for 5-10 seconds.   Straight Leg Raises - With your knee straight (if you were given a brace, keep it on), lift the leg to 60 degrees, hold for 3 seconds, and slowly lower the leg.  Perform this exercise against resistance later as your leg gets stronger.  Leg Slides: Lying on your back, slowly slide your foot toward your buttocks, bending your knee up off the floor (only go as far as is comfortable). Then slowly slide your foot back down until your leg is flat on the floor again.  Angel Wings: Lying on your back spread your legs to the side as far apart as you can without causing discomfort.  Hamstring Strength:  Lying on your back, push your heel against the floor with your leg straight by tightening up the muscles of your buttocks.  Repeat, but this time bend your knee to a comfortable angle, and push your heel against the floor.  You may put a pillow under the heel to make it more comfortable if necessary.   A rehabilitation program following joint replacement surgery can speed recovery and prevent re-injury in the future due to weakened muscles. Contact your doctor or a physical therapist for more information on knee rehabilitation.    CONSTIPATION  Constipation is defined medically as fewer than three stools per week and severe constipation as less than one stool per week.  Even if you have a regular bowel pattern at home, your normal regimen is likely to be disrupted due to multiple reasons following surgery.  Combination of anesthesia, postoperative narcotics, change in appetite and fluid intake all can affect your bowels.   YOU MUST use at least one of the following options; they are listed in order of increasing strength to get the job  done.  They are all available over the counter, and you may need to use some, POSSIBLY even all of these options:    Drink plenty of fluids (prune juice may be helpful) and high fiber foods Colace 100 mg by mouth twice a day  Senokot for constipation as directed and as needed Dulcolax (bisacodyl), take with full glass of water  Miralax (polyethylene glycol) once or twice a day as needed.  If you have tried all these things and are unable to have a bowel movement in the first 3-4 days after surgery call either your surgeon or your primary doctor.    If you experience loose stools or diarrhea, hold the medications until you stool forms back up.  If your symptoms do not get better within 1 week or if they get worse, check with your doctor.  If you experience "the worst abdominal pain ever" or develop nausea  or vomiting, please contact the office immediately for further recommendations for treatment.   ITCHING:  If you experience itching with your medications, try taking only a single pain pill, or even half a pain pill at a time.  You can also use Benadryl over the counter for itching or also to help with sleep.   TED HOSE STOCKINGS:  Use stockings on both legs until for at least 2 weeks or as directed by physician office. They may be removed at night for sleeping.  MEDICATIONS:  See your medication summary on the "After Visit Summary" that nursing will review with you.  You may have some home medications which will be placed on hold until you complete the course of blood thinner medication.  It is important for you to complete the blood thinner medication as prescribed.  PRECAUTIONS:  If you experience chest pain or shortness of breath - call 911 immediately for transfer to the hospital emergency department.   If you develop a fever greater that 101 F, purulent drainage from wound, increased redness or drainage from wound, foul odor from the wound/dressing, or calf pain - CONTACT YOUR SURGEON.                                                    FOLLOW-UP APPOINTMENTS:  If you do not already have a post-op appointment, please call the office for an appointment to be seen by your surgeon.  Guidelines for how soon to be seen are listed in your "After Visit Summary", but are typically between 1-4 weeks after surgery.  OTHER INSTRUCTIONS:   Knee Replacement:  Do not place pillow under knee, focus on keeping the knee straight while resting. CPM instructions: 0-90 degrees, 2 hours in the morning, 2 hours in the afternoon, and 2 hours in the evening. Place foam block, curve side up under heel at all times except when in CPM or when walking.  DO NOT modify, tear, cut, or change the foam block in any way.  POST-OPERATIVE OPIOID TAPER INSTRUCTIONS: It is important to wean off of your opioid medication as soon as possible. If you do not need pain medication after your surgery it is ok to stop day one. Opioids include: Codeine, Hydrocodone(Norco, Vicodin), Oxycodone(Percocet, oxycontin) and hydromorphone amongst others.  Long term and even short term use of opiods can cause: Increased pain response Dependence Constipation Depression Respiratory depression And more.  Withdrawal symptoms can include Flu like symptoms Nausea, vomiting And more Techniques to manage these symptoms Hydrate well Eat regular healthy meals Stay active Use relaxation techniques(deep breathing, meditating, yoga) Do Not substitute Alcohol to help with tapering If you have been on opioids for less than two weeks and do not have pain than it is ok to stop all together.  Plan to wean off of opioids This plan should start within one week post op of your joint replacement. Maintain the same interval or time between taking each dose and first decrease the dose.  Cut the total daily intake of opioids by one tablet each day Next start to increase the time between doses. The last dose that should be eliminated is the evening  dose.   MAKE SURE YOU:  Understand these instructions.  Get help right away if you are not doing well or get worse.    Thank you for letting  us be a part of your medical care team.  It is a privilege we respect greatly.  We hope these instructions will help you stay on track for a fast and full recovery!

## 2022-11-04 NOTE — Op Note (Unsigned)
NAME: Carol [name], Carol J. MEDICAL RECORD NO: [id number] ACCOUNT NO: [id number] DATE OF BIRTH: 05/25/1956 FACILITY: [facility] LOCATION: [facility] PHYSICIAN: [name], MD  Operative Report   DATE OF PROCEDURE: 11/04/2022  PREOPERATIVE DIAGNOSIS:  End-stage osteoarthrosis, left knee, varus deformity.  POSTOPERATIVE DIAGNOSES:  End-stage osteoarthrosis, left knee, varus deformity.  PROCEDURE PERFORMED:  Left total knee arthroplasty utilizing Attune rotating platform 5 femur, 5 tibia, 6 mm insert, 35 patella.  ANESTHESIA:  Spinal.  ASSISTANT:  Andrez Grime, PA.  HISTORY:  67 year old with end-stage osteoarthrosis medial compartment left knee indicated for replacement of the degenerated joint.  Risks and benefits discussed including bleeding, infection, damage to neurovascular structures, no change in symptoms,  worsening symptoms, DVT, PE, anesthetic complications, etc.  DESCRIPTION OF PROCEDURE:  With the patient in supine position, after induction of adequate spinal anesthesia, 2 grams Kefzol, the left lower extremity was prepped and draped and exsanguinated in usual sterile fashion.  Thigh tourniquet inflated to 225  mmHg.  Midline incision was then made over the knee.  Full thickness flaps developed.  Median parapatellar arthrotomy was performed.  Patella was gently everted, knee was flexed.  Bone-on-bone arthrosis, medial compartment of the patellofemoral joint was  noted.  Leksell rongeur was utilized to remove the remnants of medial and lateral menisci and placed a notch above the femoral notch.  This was a starting point for the step drill to enter the femoral canal in line with the femur.  It was irrigated and  a T-handle to confirm the intramedullary portion, 5-degree left with 9 off the distal femur was selected as intramedullary guide, pinned and performed a distal femoral cut.  We sized off the anterior cortex of the femur to be a 5 and 3 degrees of  external rotation, pinned.  I  placed a distal femoral jig and performed anterior, posterior and chamfer cuts with soft tissues protected at all times posteriorly.  I then subluxed the tibia, low side was medial sclerotic bone.  External alignment guide,  3 off the defect, bisecting tibiotalar joint, 3-degree slope parallel to the shaft.  This was pinned.  I performed our proximal tibial cut, protecting the popliteus and the capsule and the posterior structures at all times.  I then tried an extension  block and flexion at 6 and they were equivalent.  I then reflexed the knee, subluxed the tibia, sized it to a 5 distal medial aspect of the tibial tubercle.  This was pinned, harvested bone centrally from the tibial canal and impacted into the distal  femur.  I drilled centrally, performed a punch guide.  I then turned attention back to the femur, placed our box cut guide bisecting the condyle pinned it.  I performed a box cut.  I then placed a trial femur, which fit flush.  Drilled our lug holes,  placed a 6 mm insert and reduced the knee in full extension, full flexion, good stability to varus valgus stressing at 0-30 degrees, negative anterior drawer.  I everted the patella, measured it at a 23 planed it to a 14 utilizing patellar jig.  This was  then measured to as a 35 with a paddle parallel to the joint surface, drilled our peg holes medializing them.  I then placed a trial patella and reduced it had excellent patellofemoral tracking.  Then, all instrumentation and trials were removed.   Pulsatile lavage was used to clean the bony surfaces.  I checked posteriorly cauterized the geniculates.  Capsule and popliteus were intact.  I then flexed the knee, subluxed the tibia.  All surfaces thoroughly dried while the cement was mixed on the  back table under vacuum.  I placed drill holes in the proximal tibia.  The bone was okay laterally.  Cement was digitally pressurizing the proximal tibia.  I placed cement on the tibial tray and impacted  into place with redundant cement removed.  I  placed cement on the femur and the femoral component, impacted into place.  Redundant cement removed and I placed a 6 permanent insert, reduced the knee, held in axial load throughout the curing of the cement.  I then cemented and clamped the patella.   Marcaine with epinephrine was placed in the joint during curing of the cement as was Prontosan irrigant.  After curing of the cement, tourniquet was deflated at 67 minutes.  Any minor bleeding was cauterized.  The insert was removed, all redundant cement  was removed.  Copiously irrigated with pulsatile lavage and Prontosan. I selected a 6 mm insert.  Impacted into place, had full extension, full flexion, good stability to varus valgus stressing at 0-30 degrees, negative anterior drawer.  Excellent  patellofemoral tracking.  Following this, irrigated with Prontosan.  I used Exparel periosteum of the femur, quadriceps tendon, subadipose tissue, proximal tibia.  I then with the knee in slight flexion repaired the patellar arthrotomy with #1 Vicryl in  interrupted figure-of-eight sutures and oversewn with a running Stratafix.  I then flexed and extended the knee, I had excellent patellofemoral tracking.  With the integrity of the repair of the arthrotomy.  Next, I irrigated the subcutaneous tissues.   Subcutaneous with 2-0 and skin with subcuticular Monocryl.  Sterile dressing was applied.  Placed in immobilizer and transported to the recovery room in satisfactory condition.  The patient tolerated the procedure well.  No complications.  ASSISTANT:  Andrez Grime, PA.  BLOOD LOSS:  25 mL.  TOURNIQUET TIME:  67 minutes.   PUS D: 11/04/2022 3:19:43 pm T: 11/04/2022 5:30:00 pm  JOB: [id number]/ [id number]

## 2022-11-04 NOTE — Interval H&P Note (Signed)
History and Physical Interval Note:  11/04/2022 12:48 PM  Carol [name]  has presented today for surgery, with the diagnosis of Left knee degenerative joint disease.  The various methods of treatment have been discussed with the patient and family. After consideration of risks, benefits and other options for treatment, the patient has consented to  Procedure(s): TOTAL KNEE ARTHROPLASTY (Left) as a surgical intervention.  The patient's history has been reviewed, patient examined, no change in status, stable for surgery.  I have reviewed the patient's chart and labs.  Questions were answered to the patient's satisfaction.     [name]

## 2022-11-04 NOTE — Brief Op Note (Signed)
  11/04/2022  12:48 PM  PATIENT:  Elvera Lennox  67 y.o. female  PRE-OPERATIVE DIAGNOSIS:  Left knee degenerative joint disease  POST-OPERATIVE DIAGNOSIS:  * No post-op diagnosis entered *  PROCEDURE:  Procedure(s): TOTAL KNEE ARTHROPLASTY (Left)  SURGEON:  Surgeon(s) and Role:    Jene Every, MD - Primary  PHYSICIAN ASSISTANT:   ASSISTANTS: Bissell   ANESTHESIA:   spinal  EBL:  25    BLOOD ADMINISTERED:none  DRAINS: none   LOCAL MEDICATIONS USED:  MARCAINE     SPECIMEN:  No Specimen  DISPOSITION OF SPECIMEN:  N/A  COUNTS:  YES  TOURNIQUET:   DICTATION: .Other Dictation: Dictation Number [id number]  PLAN OF CARE: Admit for overnight observation  PATIENT DISPOSITION:  PACU - hemodynamically stable.   Delay start of Pharmacological VTE agent (>24hrs) due to surgical blood loss or risk of bleeding: no

## 2022-11-05 ENCOUNTER — Other Ambulatory Visit: Payer: Self-pay

## 2022-11-05 DIAGNOSIS — I10 Essential (primary) hypertension: Secondary | ICD-10-CM | POA: Diagnosis not present

## 2022-11-05 DIAGNOSIS — Z79891 Long term (current) use of opiate analgesic: Secondary | ICD-10-CM | POA: Diagnosis not present

## 2022-11-05 DIAGNOSIS — Z8673 Personal history of transient ischemic attack (TIA), and cerebral infarction without residual deficits: Secondary | ICD-10-CM | POA: Diagnosis not present

## 2022-11-05 DIAGNOSIS — Z7985 Long-term (current) use of injectable non-insulin antidiabetic drugs: Secondary | ICD-10-CM | POA: Diagnosis not present

## 2022-11-05 DIAGNOSIS — M1712 Unilateral primary osteoarthritis, left knee: Secondary | ICD-10-CM | POA: Diagnosis not present

## 2022-11-05 DIAGNOSIS — E119 Type 2 diabetes mellitus without complications: Secondary | ICD-10-CM | POA: Diagnosis not present

## 2022-11-05 LAB — BASIC METABOLIC PANEL
Anion gap: 12 (ref 5–15)
BUN: 19 mg/dL (ref 8–23)
CO2: 21 mmol/L — ABNORMAL LOW (ref 22–32)
Calcium: 8.7 mg/dL — ABNORMAL LOW (ref 8.9–10.3)
Chloride: 99 mmol/L (ref 98–111)
Creatinine, Ser: 1 mg/dL (ref 0.44–1.00)
GFR, Estimated: >60 mL/min (ref >60)
Glucose, Bld: 235 mg/dL — ABNORMAL HIGH (ref 70–99)
Potassium: 4.5 mmol/L (ref 3.5–5.1)
Sodium: 132 mmol/L — ABNORMAL LOW (ref 135–145)

## 2022-11-05 LAB — CBC
HCT: 42 % (ref 36.0–46.0)
Hemoglobin: 13.4 g/dL (ref 12.0–15.0)
MCH: 28.7 pg (ref 26.0–34.0)
MCHC: 31.9 g/dL (ref 30.0–36.0)
MCV: 89.9 fL (ref 80.0–100.0)
Platelets: 341 10*3/uL (ref 150–400)
RBC: 4.67 MIL/uL (ref 3.87–5.11)
RDW: 14.7 % (ref 11.5–15.5)
WBC: 23.8 10*3/uL — ABNORMAL HIGH (ref 4.0–10.5)
nRBC: 0 % (ref 0.0–0.2)

## 2022-11-05 LAB — GLUCOSE, CAPILLARY
Glucose-Capillary: 186 mg/dL — ABNORMAL HIGH (ref 70–99)
Glucose-Capillary: 169 mg/dL — ABNORMAL HIGH (ref 70–99)
Glucose-Capillary: 137 mg/dL — ABNORMAL HIGH (ref 70–99)

## 2022-11-05 MED ORDER — PROPRANOLOL HCL ER 60 MG PO CP24
60.0000 mg | ORAL_CAPSULE | Freq: Every day | ORAL | Status: DC
  Administered 2022-11-05: 60 mg via ORAL
  Filled 2022-11-05: qty 1

## 2022-11-05 NOTE — Evaluation (Signed)
Physical Therapy Evaluation Patient Details Name: Carol [name] MRN: [id number] DOB: 10-24-1955 Today's Date: 11/05/2022  History of Present Illness  Pt s/p L TKR and with hx of TIA, DM, R TKR and multiple back surgeries  Clinical Impression  Pt s/p L TKR and presents with decreased L LE strength/ROM, post op pain and c/o dizziness with OOB activity (BP 177/99 - RN aware)limiting functional mobility.  Pt should progress to dc home with family assist and reports HHPT has already reached out to her regarding first appt.     Recommendations for follow up therapy are one component of a multi-disciplinary discharge planning process, led by the attending physician.  Recommendations may be updated based on patient status, additional functional criteria and insurance authorization.  Follow Up Recommendations       Assistance Recommended at Discharge Intermittent Supervision/Assistance  Patient can return home with the following  A little help with walking and/or transfers;A little help with bathing/dressing/bathroom;Help with stairs or ramp for entrance;Assist for transportation;Assistance with cooking/housework    Equipment Recommendations None recommended by PT  Recommendations for Other Services       Functional Status Assessment Patient has had a recent decline in their functional status and demonstrates the ability to make significant improvements in function in a reasonable and predictable amount of time.     Precautions / Restrictions Precautions Precautions: Fall;Knee Restrictions Weight Bearing Restrictions: No LLE Weight Bearing: Weight bearing as tolerated      Mobility  Bed Mobility Overal bed mobility: Needs Assistance Bed Mobility: Supine to Sit     Supine to sit: Min assist     General bed mobility comments: cues for sequence and use of    Transfers Overall transfer level: Needs assistance   Transfers: Sit to/from Stand Sit to Stand: Min assist            General transfer comment: Increased time with cues for LE management and use of UEs to self assist    Ambulation/Gait Ambulation/Gait assistance: Min assist Gait Distance (Feet): 7 Feet Assistive device: Rolling walker (2 wheels) Gait Pattern/deviations: Step-to pattern, Decreased step length - right, Decreased step length - left, Shuffle, Trunk flexed Gait velocity: decr     General Gait Details: cues for sequence, posture and position from RW; distance ltd by c/o dizziness - BP 177/99  Stairs            Wheelchair Mobility    Modified Rankin (Stroke Patients Only)       Balance Overall balance assessment: Needs assistance Sitting-balance support: No upper extremity supported, Feet supported Sitting balance-Leahy Scale: Good     Standing balance support: Bilateral upper extremity supported Standing balance-Leahy Scale: Poor                               Pertinent Vitals/Pain Pain Assessment Pain Assessment: 0-10 Pain Score: 4  Pain Location: L knee Pain Descriptors / Indicators: Aching, Sore Pain Intervention(s): Limited activity within patient's tolerance, Monitored during session, Premedicated before session, Ice applied    Home Living Family/patient expects to be discharged to:: Private residence Living Arrangements: Spouse/significant other Available Help at Discharge: Family;Available 24 hours/day Type of Home: House Home Access: Stairs to enter Entrance Stairs-Rails: Right;Left;Can reach both Entrance Stairs-Number of Steps: 3   Home Layout: One level Home Equipment: BSC/3in1;Rolling Walker (2 wheels);Rollator (4 wheels)      Prior Function Prior Level of Function : Independent/Modified Independent;Driving  Mobility Comments: using cane as needed, admits to multiple falls recently with knee buckling ADLs Comments: Reports she was independent with ADLs, IADLs, and driving.  Enjoys spending time with her family      Hand Dominance   Dominant Hand: Right    Extremity/Trunk Assessment   Upper Extremity Assessment Upper Extremity Assessment: Overall WFL for tasks assessed    Lower Extremity Assessment Lower Extremity Assessment: LLE deficits/detail LLE Deficits / Details: 2+/5 quads with AAROM at knee -5 - 70    Cervical / Trunk Assessment Cervical / Trunk Assessment: Normal  Communication   Communication: No difficulties  Cognition Arousal/Alertness: Awake/alert Behavior During Therapy: WFL for tasks assessed/performed Overall Cognitive Status: Within Functional Limits for tasks assessed                                          General Comments      Exercises Total Joint Exercises Ankle Circles/Pumps: AROM, Both, 15 reps, Supine Quad Sets: AROM, Both, 10 reps, Supine Heel Slides: AAROM, Left, 15 reps, Supine Straight Leg Raises: AAROM, Left, 10 reps, Supine   Assessment/Plan    PT Assessment Patient needs continued PT services  PT Problem List Decreased strength;Decreased range of motion;Decreased activity tolerance;Decreased balance;Decreased mobility;Decreased knowledge of use of DME;Obesity;Pain       PT Treatment Interventions DME instruction;Gait training;Stair training;Functional mobility training;Therapeutic activities;Therapeutic exercise;Patient/family education    PT Goals (Current goals can be found in the Care Plan section)  Acute Rehab PT Goals Patient Stated Goal: Regain IND PT Goal Formulation: With patient Time For Goal Achievement: 11/12/22 Potential to Achieve Goals: Good    Frequency 7X/week     Co-evaluation               AM-PAC PT "6 Clicks" Mobility  Outcome Measure Help needed turning from your back to your side while in a flat bed without using bedrails?: A Little Help needed moving from lying on your back to sitting on the side of a flat bed without using bedrails?: A Little Help needed moving to and from a bed to a  chair (including a wheelchair)?: A Little Help needed standing up from a chair using your arms (e.g., wheelchair or bedside chair)?: A Little Help needed to walk in hospital room?: Total Help needed climbing 3-5 steps with a railing? : Total 6 Click Score: 14    End of Session Equipment Utilized During Treatment: Gait belt Activity Tolerance: Other (comment) (dizzy) Patient left: in chair;with call bell/phone within reach;with chair alarm set Nurse Communication: Mobility status PT Visit Diagnosis: Unsteadiness on feet (R26.81);Difficulty in walking, not elsewhere classified (R26.2);Pain Pain - Right/Left: Left Pain - part of body: Knee    Time: 1610-9604 PT Time Calculation (min) (ACUTE ONLY): 32 min   Charges:   PT Evaluation $PT Eval Low Complexity: 1 Low PT Treatments $Therapeutic Exercise: 8-22 mins        Mauro Kaufmann PT Acute Rehabilitation Services Pager [phone] Office [phone]   [facility] 11/05/2022, 12:26 PM

## 2022-11-05 NOTE — TOC Transition Note (Signed)
Transition of Care [facility]) - CM/SW Discharge Note   Patient Details  Name: Carol [name] MRN: [id number] Date of Birth: [birth year]  [facility] CM/SW Contact:  Amada Jupiter, LCSW Phone Number: 11/05/2022, 11:13 AM   Clinical Narrative:     Met briefly with pt who confirms she has needed DME at home.   HHPT prearranged with [facility]. No TOC needs.  Final next level of care: Home w Home Health Services Barriers to Discharge: No Barriers Identified   Patient Goals and CMS Choice      Discharge Placement                         Discharge Plan and Services Additional resources added to the After Visit Summary for                  DME Arranged: N/A DME Agency: NA       HH Arranged: PT HH Agency: [facility]        Social Determinants of Health (SDOH) Interventions SDOH Screenings   Food Insecurity: Patient Declined (11/04/2022)  Housing: Low Risk  (11/04/2022)  Transportation Needs: No Transportation Needs (11/04/2022)  Utilities: Not At Risk (11/04/2022)  Depression (PHQ2-9): Medium Risk (05/20/2021)  Financial Resource Strain: Low Risk  (05/20/2021)  Social Connections: Moderately Integrated (05/20/2021)  Stress: Stress Concern Present (05/20/2021)  Tobacco Use: Low Risk  (11/04/2022)     Readmission Risk Interventions     No data to display

## 2022-11-05 NOTE — Plan of Care (Signed)

## 2022-11-05 NOTE — Progress Notes (Signed)
    Subjective: Patient seen in rounds for Dr. Shelle Iron.  Patient reports pain as mild to moderate.  Denies N/V/CP/SOB/Abd pain currently.  She reports she did not get much sleep last night. She feels like she would get more rest at home.  Denies any tinging or numbness in LE bilaterally.  BP high this morning. Will get manual.   Objective:   VITALS:   Vitals:   11/05/22 0603 11/05/22 0935 11/05/22 1408 11/05/22 1447  BP: (!) 195/94 (!) 188/102 (!) 197/96 136/84  Pulse: 68 68 66   Resp: 16 20 16    Temp: 97.8 F (36.6 C) (!) 97.5 F (36.4 C) 97.9 F (36.6 C)   TempSrc: Oral Oral    SpO2: 92% 93% 93%   Weight:      Height:        Patient sitting up in bed. NAD. Neurologically intact ABD soft Neurovascular intact Sensation intact distally Intact pulses distally Dorsiflexion/Plantar flexion intact Incision: dressing C/D/I No cellulitis present Compartment soft   Lab Results  Component Value Date   WBC 23.8 (H) 11/05/2022   HGB 13.4 11/05/2022   HCT 42.0 11/05/2022   MCV 89.9 11/05/2022   PLT 341 11/05/2022   BMET    Component Value Date/Time   NA 132 (L) 11/05/2022 0302   K 4.5 11/05/2022 0302   CL 99 11/05/2022 0302   CO2 21 (L) 11/05/2022 0302   GLUCOSE 235 (H) 11/05/2022 0302   BUN 19 11/05/2022 0302   CREATININE 1.00 11/05/2022 0302   CREATININE 0.79 02/10/2021 1234   CALCIUM 8.7 (L) 11/05/2022 0302   GFRNONAA >60 11/05/2022 0302   GFRNONAA >60 02/10/2021 1234     Assessment/Plan: 1 Day Post-Op   Principal Problem:   Left knee DJD   WBAT with walker DVT ppx: Aspirin, SCDs, TEDS PO pain control PT/OT: PT has not seen yet. PT to come by today.  Dispo: D/c home once cleared with PT. Patient has HHPT ordered. Patient to follow-up in office in 2 weeks.   Clois Dupes, PA-C 11/05/2022, 4:30 PM   [facility] [address], [address], [place], [place] [address] Phone: [phone] [email or web address] Facebook   [employer]

## 2022-11-05 NOTE — Progress Notes (Signed)
Patient discharged to home w/ family. Given all belongings, instructions, equipment. Verbalized understanding of all instructions. Escorted to pov via w/c. 

## 2022-11-05 NOTE — Progress Notes (Signed)
Physical Therapy Treatment Patient Details Name: Carol [name] MRN: [id number] DOB: [birth year] Today's Date: 11/05/2022   History of Present Illness Pt s/p L TKR and with hx of TIA, DM, R TKR and multiple back surgeries    PT Comments    Pt motivated, progressing well with mobility and with no c/o dizziness this pm.  Pt up to ambulate increased distance in hall and negotiated stairs with min assist.  Pt and dtr both comfortable with dc home this date and report HHPT set up for tomorrow.   Recommendations for follow up therapy are one component of a multi-disciplinary discharge planning process, led by the attending physician.  Recommendations may be updated based on patient status, additional functional criteria and insurance authorization.  Follow Up Recommendations       Assistance Recommended at Discharge Intermittent Supervision/Assistance  Patient can return home with the following A little help with walking and/or transfers;A little help with bathing/dressing/bathroom;Help with stairs or ramp for entrance;Assist for transportation;Assistance with cooking/housework   Equipment Recommendations  None recommended by PT    Recommendations for Other Services       Precautions / Restrictions Precautions Precautions: Fall;Knee Restrictions Weight Bearing Restrictions: No LLE Weight Bearing: Weight bearing as tolerated     Mobility  Bed Mobility Overal bed mobility: Needs Assistance Bed Mobility: Supine to Sit     Supine to sit: Min assist     General bed mobility comments: cues for sequence and use of    Transfers Overall transfer level: Needs assistance Equipment used: Rolling walker (2 wheels) Transfers: Sit to/from Stand Sit to Stand: Min assist           General transfer comment: Increased time with cues for LE management and use of UEs to self assist    Ambulation/Gait Ambulation/Gait assistance: Min assist Gait Distance (Feet): 78 Feet Assistive  device: Rolling walker (2 wheels) Gait Pattern/deviations: Step-to pattern, Decreased step length - right, Decreased step length - left, Shuffle, Trunk flexed Gait velocity: decr     General Gait Details: cues for sequence, posture and position from RW; distance ltd by c/o dizziness - BP 177/99   Stairs Stairs: Yes Stairs assistance: Min assist Stair Management: Two rails, Step to pattern, Forwards Number of Stairs: 5 General stair comments: cues for sequence   Wheelchair Mobility    Modified Rankin (Stroke Patients Only)       Balance Overall balance assessment: Needs assistance Sitting-balance support: No upper extremity supported, Feet supported Sitting balance-Leahy Scale: Good     Standing balance support: No upper extremity supported Standing balance-Leahy Scale: Fair                              Cognition Arousal/Alertness: Awake/alert Behavior During Therapy: WFL for tasks assessed/performed Overall Cognitive Status: Within Functional Limits for tasks assessed                                          Exercises Total Joint Exercises Ankle Circles/Pumps: AROM, Both, 15 reps, Supine Quad Sets: AROM, Both, 10 reps, Supine Heel Slides: AAROM, Left, 15 reps, Supine Straight Leg Raises: AAROM, Left, 10 reps, Supine    General Comments        Pertinent Vitals/Pain Pain Assessment Pain Assessment: 0-10 Pain Score: 7  Pain Location: L knee Pain Descriptors / Indicators: Aching,  Sore Pain Intervention(s): Limited activity within patient's tolerance, Monitored during session, Premedicated before session, Ice applied    Home Living                          Prior Function            PT Goals (current goals can now be found in the care plan section) Acute Rehab PT Goals Patient Stated Goal: Regain IND PT Goal Formulation: With patient Time For Goal Achievement: 11/12/22 Potential to Achieve Goals: Good Progress  towards PT goals: Progressing toward goals    Frequency    7X/week      PT Plan Current plan remains appropriate    Co-evaluation              AM-PAC PT "6 Clicks" Mobility   Outcome Measure  Help needed turning from your back to your side while in a flat bed without using bedrails?: A Little Help needed moving from lying on your back to sitting on the side of a flat bed without using bedrails?: A Little Help needed moving to and from a bed to a chair (including a wheelchair)?: A Little Help needed standing up from a chair using your arms (e.g., wheelchair or bedside chair)?: A Little Help needed to walk in hospital room?: A Little Help needed climbing 3-5 steps with a railing? : A Little 6 Click Score: 18    End of Session Equipment Utilized During Treatment: Gait belt Activity Tolerance: Patient tolerated treatment well Patient left: in chair;with call bell/phone within reach;with family/visitor present Nurse Communication: Mobility status PT Visit Diagnosis: Unsteadiness on feet (R26.81);Difficulty in walking, not elsewhere classified (R26.2);Pain Pain - Right/Left: Left Pain - part of body: Knee     Time: 1308-6578 PT Time Calculation (min) (ACUTE ONLY): 16 min  Charges:  $Gait Training: 8-22 mins                     Mauro Kaufmann PT Acute Rehabilitation Services Pager [phone] Office [phone]    [facility] 11/05/2022, 3:38 PM

## 2022-11-06 DIAGNOSIS — K5730 Diverticulosis of large intestine without perforation or abscess without bleeding: Secondary | ICD-10-CM | POA: Diagnosis not present

## 2022-11-06 DIAGNOSIS — Z7982 Long term (current) use of aspirin: Secondary | ICD-10-CM | POA: Diagnosis not present

## 2022-11-06 DIAGNOSIS — G8929 Other chronic pain: Secondary | ICD-10-CM | POA: Diagnosis not present

## 2022-11-06 DIAGNOSIS — J302 Other seasonal allergic rhinitis: Secondary | ICD-10-CM | POA: Diagnosis not present

## 2022-11-06 DIAGNOSIS — H9319 Tinnitus, unspecified ear: Secondary | ICD-10-CM | POA: Diagnosis not present

## 2022-11-06 DIAGNOSIS — F419 Anxiety disorder, unspecified: Secondary | ICD-10-CM | POA: Diagnosis not present

## 2022-11-06 DIAGNOSIS — M545 Low back pain, unspecified: Secondary | ICD-10-CM | POA: Diagnosis not present

## 2022-11-06 DIAGNOSIS — Z8673 Personal history of transient ischemic attack (TIA), and cerebral infarction without residual deficits: Secondary | ICD-10-CM | POA: Diagnosis not present

## 2022-11-06 DIAGNOSIS — Z87442 Personal history of urinary calculi: Secondary | ICD-10-CM | POA: Diagnosis not present

## 2022-11-06 DIAGNOSIS — M1711 Unilateral primary osteoarthritis, right knee: Secondary | ICD-10-CM | POA: Diagnosis not present

## 2022-11-06 DIAGNOSIS — Z79891 Long term (current) use of opiate analgesic: Secondary | ICD-10-CM | POA: Diagnosis not present

## 2022-11-06 DIAGNOSIS — Z96652 Presence of left artificial knee joint: Secondary | ICD-10-CM | POA: Diagnosis not present

## 2022-11-06 DIAGNOSIS — Z8701 Personal history of pneumonia (recurrent): Secondary | ICD-10-CM | POA: Diagnosis not present

## 2022-11-06 DIAGNOSIS — E039 Hypothyroidism, unspecified: Secondary | ICD-10-CM | POA: Diagnosis not present

## 2022-11-06 DIAGNOSIS — Z853 Personal history of malignant neoplasm of breast: Secondary | ICD-10-CM | POA: Diagnosis not present

## 2022-11-06 DIAGNOSIS — F332 Major depressive disorder, recurrent severe without psychotic features: Secondary | ICD-10-CM | POA: Diagnosis not present

## 2022-11-06 DIAGNOSIS — G4700 Insomnia, unspecified: Secondary | ICD-10-CM | POA: Diagnosis not present

## 2022-11-06 DIAGNOSIS — Z9181 History of falling: Secondary | ICD-10-CM | POA: Diagnosis not present

## 2022-11-06 DIAGNOSIS — E119 Type 2 diabetes mellitus without complications: Secondary | ICD-10-CM | POA: Diagnosis not present

## 2022-11-06 DIAGNOSIS — M1990 Unspecified osteoarthritis, unspecified site: Secondary | ICD-10-CM | POA: Diagnosis not present

## 2022-11-06 DIAGNOSIS — Z471 Aftercare following joint replacement surgery: Secondary | ICD-10-CM | POA: Diagnosis not present

## 2022-11-06 DIAGNOSIS — E669 Obesity, unspecified: Secondary | ICD-10-CM | POA: Diagnosis not present

## 2022-11-08 ENCOUNTER — Encounter (HOSPITAL_COMMUNITY): Payer: Self-pay | Admitting: Specialist

## 2022-11-08 DIAGNOSIS — F332 Major depressive disorder, recurrent severe without psychotic features: Secondary | ICD-10-CM | POA: Diagnosis not present

## 2022-11-08 DIAGNOSIS — E119 Type 2 diabetes mellitus without complications: Secondary | ICD-10-CM | POA: Diagnosis not present

## 2022-11-08 DIAGNOSIS — Z96652 Presence of left artificial knee joint: Secondary | ICD-10-CM | POA: Diagnosis not present

## 2022-11-08 DIAGNOSIS — G8929 Other chronic pain: Secondary | ICD-10-CM | POA: Diagnosis not present

## 2022-11-08 DIAGNOSIS — Z471 Aftercare following joint replacement surgery: Secondary | ICD-10-CM | POA: Diagnosis not present

## 2022-11-08 DIAGNOSIS — E039 Hypothyroidism, unspecified: Secondary | ICD-10-CM | POA: Diagnosis not present

## 2022-11-08 DIAGNOSIS — T50905A Adverse effect of unspecified drugs, medicaments and biological substances, initial encounter: Secondary | ICD-10-CM | POA: Diagnosis not present

## 2022-11-08 DIAGNOSIS — I10 Essential (primary) hypertension: Secondary | ICD-10-CM | POA: Diagnosis not present

## 2022-11-18 DIAGNOSIS — Z96652 Presence of left artificial knee joint: Secondary | ICD-10-CM | POA: Diagnosis not present

## 2022-11-18 DIAGNOSIS — M25562 Pain in left knee: Secondary | ICD-10-CM | POA: Diagnosis not present

## 2022-11-18 DIAGNOSIS — Z471 Aftercare following joint replacement surgery: Secondary | ICD-10-CM | POA: Diagnosis not present

## 2022-11-21 ENCOUNTER — Ambulatory Visit (HOSPITAL_BASED_OUTPATIENT_CLINIC_OR_DEPARTMENT_OTHER)
Admission: RE | Admit: 2022-11-21 | Discharge: 2022-11-21 | Disposition: A | Discharge status: Home / Self Care | Payer: Medicare Other | Source: Ambulatory Visit | Attending: Specialist | Admitting: Specialist

## 2022-11-21 ENCOUNTER — Other Ambulatory Visit (HOSPITAL_BASED_OUTPATIENT_CLINIC_OR_DEPARTMENT_OTHER): Payer: Self-pay | Admitting: Specialist

## 2022-11-21 DIAGNOSIS — Z96652 Presence of left artificial knee joint: Secondary | ICD-10-CM | POA: Diagnosis not present

## 2022-11-21 DIAGNOSIS — M79604 Pain in right leg: Secondary | ICD-10-CM | POA: Diagnosis not present

## 2022-11-21 DIAGNOSIS — M79605 Pain in left leg: Secondary | ICD-10-CM | POA: Diagnosis not present

## 2022-11-21 DIAGNOSIS — R600 Localized edema: Secondary | ICD-10-CM | POA: Diagnosis not present

## 2022-11-25 ENCOUNTER — Other Ambulatory Visit (HOSPITAL_BASED_OUTPATIENT_CLINIC_OR_DEPARTMENT_OTHER): Payer: Self-pay | Admitting: Family Medicine

## 2022-11-25 DIAGNOSIS — Z96652 Presence of left artificial knee joint: Secondary | ICD-10-CM | POA: Diagnosis not present

## 2022-11-25 DIAGNOSIS — Z96651 Presence of right artificial knee joint: Secondary | ICD-10-CM | POA: Diagnosis not present

## 2022-11-25 DIAGNOSIS — J069 Acute upper respiratory infection, unspecified: Secondary | ICD-10-CM | POA: Diagnosis not present

## 2022-11-25 DIAGNOSIS — R600 Localized edema: Secondary | ICD-10-CM | POA: Diagnosis not present

## 2022-11-26 ENCOUNTER — Ambulatory Visit (HOSPITAL_BASED_OUTPATIENT_CLINIC_OR_DEPARTMENT_OTHER)
Admission: RE | Admit: 2022-11-26 | Discharge: 2022-11-26 | Disposition: A | Discharge status: Home / Self Care | Payer: Medicare Other | Source: Ambulatory Visit | Attending: Family Medicine | Admitting: Family Medicine

## 2022-11-26 DIAGNOSIS — I10 Essential (primary) hypertension: Secondary | ICD-10-CM | POA: Diagnosis not present

## 2022-11-26 DIAGNOSIS — Z96652 Presence of left artificial knee joint: Secondary | ICD-10-CM | POA: Diagnosis not present

## 2022-11-26 DIAGNOSIS — I2699 Other pulmonary embolism without acute cor pulmonale: Secondary | ICD-10-CM | POA: Diagnosis not present

## 2022-11-26 DIAGNOSIS — Z7901 Long term (current) use of anticoagulants: Secondary | ICD-10-CM | POA: Diagnosis not present

## 2022-11-26 MED ORDER — IOHEXOL 350 MG/ML SOLN
80.0000 mL | Freq: Once | INTRAVENOUS | Status: AC | PRN
  Administered 2022-11-26: 80 mL via INTRAVENOUS

## 2022-12-02 DIAGNOSIS — J069 Acute upper respiratory infection, unspecified: Secondary | ICD-10-CM | POA: Diagnosis not present

## 2022-12-02 DIAGNOSIS — I2699 Other pulmonary embolism without acute cor pulmonale: Secondary | ICD-10-CM | POA: Diagnosis not present

## 2022-12-02 DIAGNOSIS — I10 Essential (primary) hypertension: Secondary | ICD-10-CM | POA: Diagnosis not present

## 2022-12-02 DIAGNOSIS — F411 Generalized anxiety disorder: Secondary | ICD-10-CM | POA: Diagnosis not present

## 2022-12-02 DIAGNOSIS — G4700 Insomnia, unspecified: Secondary | ICD-10-CM | POA: Diagnosis not present

## 2022-12-02 DIAGNOSIS — E039 Hypothyroidism, unspecified: Secondary | ICD-10-CM | POA: Diagnosis not present

## 2022-12-02 DIAGNOSIS — F331 Major depressive disorder, recurrent, moderate: Secondary | ICD-10-CM | POA: Diagnosis not present

## 2022-12-06 ENCOUNTER — Other Ambulatory Visit: Payer: Self-pay

## 2022-12-06 ENCOUNTER — Encounter (HOSPITAL_BASED_OUTPATIENT_CLINIC_OR_DEPARTMENT_OTHER): Payer: Self-pay | Admitting: Emergency Medicine

## 2022-12-06 ENCOUNTER — Emergency Department (HOSPITAL_BASED_OUTPATIENT_CLINIC_OR_DEPARTMENT_OTHER)
Admission: EM | Admit: 2022-12-06 | Discharge: 2022-12-06 | Disposition: A | Discharge status: Home / Self Care | Payer: Medicare Other | Attending: Emergency Medicine | Admitting: Emergency Medicine

## 2022-12-06 ENCOUNTER — Emergency Department (HOSPITAL_BASED_OUTPATIENT_CLINIC_OR_DEPARTMENT_OTHER): Payer: Medicare Other | Admitting: Radiology

## 2022-12-06 DIAGNOSIS — R9431 Abnormal electrocardiogram [ECG] [EKG]: Secondary | ICD-10-CM | POA: Diagnosis not present

## 2022-12-06 DIAGNOSIS — R042 Hemoptysis: Secondary | ICD-10-CM | POA: Diagnosis not present

## 2022-12-06 DIAGNOSIS — Z7901 Long term (current) use of anticoagulants: Secondary | ICD-10-CM | POA: Insufficient documentation

## 2022-12-06 DIAGNOSIS — R42 Dizziness and giddiness: Secondary | ICD-10-CM | POA: Insufficient documentation

## 2022-12-06 DIAGNOSIS — I2699 Other pulmonary embolism without acute cor pulmonale: Secondary | ICD-10-CM | POA: Diagnosis not present

## 2022-12-06 DIAGNOSIS — Z7982 Long term (current) use of aspirin: Secondary | ICD-10-CM | POA: Insufficient documentation

## 2022-12-06 LAB — BASIC METABOLIC PANEL
Anion gap: 9 (ref 5–15)
BUN: 23 mg/dL (ref 8–23)
CO2: 28 mmol/L (ref 22–32)
Calcium: 9.7 mg/dL (ref 8.9–10.3)
Chloride: 102 mmol/L (ref 98–111)
Creatinine, Ser: 1.06 mg/dL — ABNORMAL HIGH (ref 0.44–1.00)
GFR, Estimated: 58 mL/min — ABNORMAL LOW (ref >60)
Glucose, Bld: 163 mg/dL — ABNORMAL HIGH (ref 70–99)
Potassium: 3.8 mmol/L (ref 3.5–5.1)
Sodium: 139 mmol/L (ref 135–145)

## 2022-12-06 LAB — CBC WITH DIFFERENTIAL/PLATELET
Abs Immature Granulocytes: 0.16 10*3/uL — ABNORMAL HIGH (ref 0.00–0.07)
Basophils Absolute: 0.1 10*3/uL (ref 0.0–0.1)
Basophils Relative: 1 %
Eosinophils Absolute: 0.3 10*3/uL (ref 0.0–0.5)
Eosinophils Relative: 3 %
HCT: 43.7 % (ref 36.0–46.0)
Hemoglobin: 13.8 g/dL (ref 12.0–15.0)
Immature Granulocytes: 2 %
Lymphocytes Relative: 21 %
Lymphs Abs: 2 10*3/uL (ref 0.7–4.0)
MCH: 29.1 pg (ref 26.0–34.0)
MCHC: 31.6 g/dL (ref 30.0–36.0)
MCV: 92 fL (ref 80.0–100.0)
Monocytes Absolute: 0.6 10*3/uL (ref 0.1–1.0)
Monocytes Relative: 7 %
Neutro Abs: 6.3 10*3/uL (ref 1.7–7.7)
Neutrophils Relative %: 66 %
Platelets: 374 10*3/uL (ref 150–400)
RBC: 4.75 MIL/uL (ref 3.87–5.11)
RDW: 15.7 % — ABNORMAL HIGH (ref 11.5–15.5)
WBC: 9.4 10*3/uL (ref 4.0–10.5)
nRBC: 0 % (ref 0.0–0.2)

## 2022-12-06 LAB — TROPONIN I (HIGH SENSITIVITY): Troponin I (High Sensitivity): 2 ng/L (ref <18)

## 2022-12-06 LAB — BRAIN NATRIURETIC PEPTIDE: B Natriuretic Peptide: 35.3 pg/mL (ref 0.0–100.0)

## 2022-12-06 NOTE — ED Triage Notes (Signed)
Pt c/o hemoptysis today. Pt states that she had knee surgery on 4/12 and started having SHOB on 5/4 and had a CT which showed a blood clot and put on eliquis. Pt states that she was told to come in if she coughed up blood.

## 2022-12-06 NOTE — ED Provider Notes (Signed)
[facility] AT [facility] Provider Note   CSN: [id number] Arrival date & time: 12/06/22  2015     History  Chief Complaint  Patient presents with   Hemoptysis    Carol [name] is a 67 y.o. female presented to the ED with complaint of hemoptysis.  The patient was diagnosed with small amount of pulmonary emboli in the right pulmonary arteries on 11/26/22 CT PE study per my review of the records, as well as some patchy groundglass densities in both lungs.  The patient reports she was started on Eliquis at that time, as well as antibiotics for pneumonia and steroids.  She has completed the antibiotics and the steroids.  She is now on Eliquis 5 mg twice a day.  She reports she has had some "wooziness and lightheadedness" particularly with exertion for the past several days.  She reports that today she coughed up several bloody mucous plugs.  She was told by her doctor to come to the ER if this happens.  She denies any dark or tarry stools.  The patient had a DVT ultrasound on 11/21/22 that was negative  HPI     Home Medications Prior to Admission medications   Medication Sig Start Date End Date Taking? Authorizing Provider  aspirin EC 81 MG tablet Take 1 tablet (81 mg total) by mouth 2 (two) times daily after a meal. Swallow whole. 11/04/22   Jene Every, MD  baclofen (LIORESAL) 10 MG tablet Take 10 mg by mouth 3 (three) times daily.    [provider]  docusate sodium (COLACE) 100 MG capsule Take 1 capsule (100 mg total) by mouth 2 (two) times daily as needed for mild constipation. 11/04/22   Jene Every, MD  escitalopram (LEXAPRO) 20 MG tablet Take 20 mg by mouth daily. 11/30/21   [provider]  levothyroxine (SYNTHROID) 100 MCG tablet Take 100 mcg by mouth daily before breakfast.    [provider]  methocarbamol (ROBAXIN) 500 MG tablet Take 1 tablet (500 mg total) by mouth every 8 (eight) hours as needed for muscle spasms. 11/04/22    Jene Every, MD  oxybutynin (DITROPAN-XL) 10 MG 24 hr tablet Take 10 mg by mouth at bedtime.    [provider]  polyethylene glycol (MIRALAX / GLYCOLAX) 17 g packet Take 17 g by mouth daily. 11/04/22   Jene Every, MD  Prenatal Vit-Fe Fumarate-FA (PRENATAL MULTIVITAMIN) TABS tablet Take 1 tablet by mouth daily at 12 noon.    [provider]  propranolol ER (INDERAL LA) 60 MG 24 hr capsule Take 60 mg by mouth at bedtime. 10/07/22   [provider]  tirzepatide Greggory Keen) 5 MG/0.5ML Pen Inject 5 mg into the skin once a week. 08/02/22     tirzepatide (MOUNJARO) 7.5 MG/0.5ML Pen Inject 7.5 mg into the skin once a week. 10/05/22     topiramate (TOPAMAX) 25 MG tablet Take 1 tablet (25 mg total) by mouth 2 (two) times daily. Patient not taking: Reported on 10/28/2022 03/16/22   Micki Riley, MD      Allergies    Acetaminophen, Celebrex [celecoxib], Povidone iodine, Tape, Codeine, and Doxycycline    Review of Systems   Review of Systems  Physical Exam Updated Vital Signs BP 105/65   Pulse 76   Temp 98.3 F (36.8 C) (Oral)   Resp 15   Ht 5' 6.5" (1.689 m)   Wt 100 kg   SpO2 94%   BMI 35.05 kg/m  Physical  Exam Constitutional:      General: She is not in acute distress. HENT:     Head: Normocephalic and atraumatic.  Eyes:     Conjunctiva/sclera: Conjunctivae normal.     Pupils: Pupils are equal, round, and reactive to light.  Cardiovascular:     Rate and Rhythm: Normal rate and regular rhythm.  Pulmonary:     Effort: Pulmonary effort is normal. No respiratory distress.     Breath sounds: Normal breath sounds.  Abdominal:     General: There is no distension.     Tenderness: There is no abdominal tenderness.  Skin:    General: Skin is warm and dry.  Neurological:     General: No focal deficit present.     Mental Status: She is alert and oriented to person, place, and time. Mental status is at baseline.  Psychiatric:        Mood and Affect: Mood  normal.        Behavior: Behavior normal.     ED Results / Procedures / Treatments   Labs (all labs ordered are listed, but only abnormal results are displayed) Labs Reviewed  BASIC METABOLIC PANEL - Abnormal; Notable for the following components:      Result Value   Glucose, Bld 163 (*)    Creatinine, Ser 1.06 (*)    GFR, Estimated 58 (*)    All other components within normal limits  CBC WITH DIFFERENTIAL/PLATELET - Abnormal; Notable for the following components:   RDW 15.7 (*)    Abs Immature Granulocytes 0.16 (*)    All other components within normal limits  BRAIN NATRIURETIC PEPTIDE  TROPONIN I (HIGH SENSITIVITY)    EKG EKG Interpretation  Date/Time:  Tuesday Dec 06 2022 21:24:44 EDT Ventricular Rate:  74 PR Interval:  137 QRS Duration: 92 QT Interval:  401 QTC Calculation: 445 R Axis:   12 Text Interpretation: Sinus rhythm Low voltage, precordial leads Borderline T abnormalities, anterior leads Confirmed by Alvester Chou [phone]) on 12/06/2022 9:34:53 PM  Radiology DG Chest 2 View  Result Date: 12/06/2022 CLINICAL DATA:  Hemoptysis EXAM: CHEST - 2 VIEW COMPARISON:  11/26/2022 CT FINDINGS: Cardiac shadow is within normal limits. Lungs are well aerated bilaterally. No focal infiltrate or effusion is seen. Degenerative changes of the thoracic spine are noted. IMPRESSION: No acute abnormality noted. Electronically Signed   By: Alcide Clever M.D.   On: 12/06/2022 21:58    Procedures Procedures    Medications Ordered in ED Medications - No data to display  ED Course/ Medical Decision Making/ A&P Clinical Course as of 12/06/22 2242  Tue Dec 06, 2022  2235 I spoke to Dr Artis Delay from hematology about the small quantity hemoptysis -we did give the patient the option of observation in the hospital versus follow-up as an outpatient at home.  I do think she would be stable for home follow-up given the very small quantity of hemoptysis and the fact that she is been stable  in her ED.  She strongly prefers to go home.  This is reasonable.  Dr Bertis Ruddy will help arrange for expedited follow up [MT]    Clinical Course User Index [MT] Symphoni Helbling, Kermit Balo, MD                             Medical Decision Making Amount and/or Complexity of Data Reviewed Labs: ordered. Radiology: ordered. ECG/medicine tests: ordered.   This patient presents to the  ED with concern for mall amount of hemoptysis. This involves an extensive number of treatment options, and is a complaint that carries with it a high risk of complications and morbidity.  The differential diagnosis includes bronchitis versus persistent or recurring pneumonia versus spontaneous bleeding on anticoagulation versus other  Co-morbidities that complicate the patient evaluation: Recent initiation of Eliquis at high risk of bleeding  External records from outside source obtained and reviewed including DVT u/s and CT PE study  I ordered and personally interpreted labs.  The pertinent results include: Within normal limits.  Troponin negative.  BNP within normal limits.  I ordered imaging studies including x-ray of the chest I independently visualized and interpreted imaging which showed no persistent pneumonia pattern seen. I agree with the radiologist interpretation  The patient was maintained on a cardiac monitor.  I personally viewed and interpreted the cardiac monitored which showed an underlying rhythm of: Sinus rhythm, no hypoxia  Per my interpretation the patient's ECG shows no acute ischemic findings   I have reviewed the patients home medicines and have made adjustments as needed  Test Considered: No indication for repeat PE study at this time.  I do not see evidence of hypoxia, troponin elevation, or large clot burden at this time.  After the interventions noted above, I reevaluated the patient and found that they have: stayed the same  Patient has remained asymptomatic through her stay in the  ED.  Dispostion:  After consideration of the diagnostic results and the patients response to treatment, I feel that the patent would benefit from close outpatient follow-up..         Final Clinical Impression(s) / ED Diagnoses Final diagnoses:  Hemoptysis    Rx / DC Orders ED Discharge Orders     None         Terald Sleeper, MD 12/06/22 2242

## 2022-12-06 NOTE — Discharge Instructions (Addendum)
You will need to follow-up with a hematologist who is a blood specialist for your blood clot.  Please continue taking the Eliquis in the meantime.  It is important to stay on this medication to prevent further blood clots from developing.  Most cases of coughing blood resolve on their own.  However, please pay attention to the return precautions described on the attached instructions.  This would include reasons to come back to the hospital.  If you do not hear from the hematology office by the end of the day tomorrow, you can contact the number above to request a follow-up appointment at the hematologist.

## 2022-12-22 ENCOUNTER — Other Ambulatory Visit: Payer: Self-pay

## 2022-12-22 DIAGNOSIS — D0511 Intraductal carcinoma in situ of right breast: Secondary | ICD-10-CM

## 2022-12-23 ENCOUNTER — Inpatient Hospital Stay: Payer: Medicare Other | Admitting: Hematology and Oncology

## 2022-12-23 ENCOUNTER — Inpatient Hospital Stay: Payer: Medicare Other

## 2022-12-23 DIAGNOSIS — I2699 Other pulmonary embolism without acute cor pulmonale: Secondary | ICD-10-CM | POA: Insufficient documentation

## 2022-12-23 NOTE — Assessment & Plan Note (Deleted)
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  Right lateral lumpectomy: Benign, ER greater than 95%, PR 30% Followed by adjuvant radiation therapy 05/27/2021-06/25/2021 Tamoxifen was not given because she was felt to be high risk for blood clots  Breast cancer surveillance: Mammogram 12/07/2021: Benign breast density category C (seroma 1.9 cm)

## 2022-12-23 NOTE — Assessment & Plan Note (Deleted)
11/26/2022: CT PE protocol: Small amount of pulmonary emboli in the right pulmonary arteries Current treatment: Eliquis Toxicities: Hemoptysis requiring emergency room visit Predisposing factors:  Hypercoagulability workup will be initiated

## 2022-12-26 ENCOUNTER — Telehealth: Payer: Self-pay | Admitting: Hematology and Oncology

## 2022-12-26 DIAGNOSIS — Z79899 Other long term (current) drug therapy: Secondary | ICD-10-CM | POA: Diagnosis not present

## 2022-12-26 DIAGNOSIS — M79606 Pain in leg, unspecified: Secondary | ICD-10-CM | POA: Diagnosis not present

## 2022-12-26 NOTE — Telephone Encounter (Signed)
Patient wanting to reschedule appointment due to cancelling the last one. Patient is aware of new times/dates.

## 2022-12-27 ENCOUNTER — Ambulatory Visit (INDEPENDENT_AMBULATORY_CARE_PROVIDER_SITE_OTHER): Payer: Medicare Other | Admitting: Pulmonary Disease

## 2022-12-27 ENCOUNTER — Encounter: Payer: Self-pay | Admitting: Pulmonary Disease

## 2022-12-27 VITALS — BP 124/72 | HR 89 | Ht 66.5 in | Wt 226.0 lb

## 2022-12-27 DIAGNOSIS — R042 Hemoptysis: Secondary | ICD-10-CM | POA: Diagnosis not present

## 2022-12-27 DIAGNOSIS — J189 Pneumonia, unspecified organism: Secondary | ICD-10-CM | POA: Diagnosis not present

## 2022-12-27 DIAGNOSIS — J984 Other disorders of lung: Secondary | ICD-10-CM | POA: Diagnosis not present

## 2022-12-27 DIAGNOSIS — E669 Obesity, unspecified: Secondary | ICD-10-CM | POA: Diagnosis not present

## 2022-12-27 DIAGNOSIS — J45909 Unspecified asthma, uncomplicated: Secondary | ICD-10-CM | POA: Diagnosis not present

## 2022-12-27 DIAGNOSIS — R5383 Other fatigue: Secondary | ICD-10-CM | POA: Diagnosis not present

## 2022-12-27 DIAGNOSIS — I2694 Multiple subsegmental pulmonary emboli without acute cor pulmonale: Secondary | ICD-10-CM | POA: Diagnosis not present

## 2022-12-27 DIAGNOSIS — I2699 Other pulmonary embolism without acute cor pulmonale: Secondary | ICD-10-CM | POA: Diagnosis not present

## 2022-12-27 DIAGNOSIS — F411 Generalized anxiety disorder: Secondary | ICD-10-CM | POA: Diagnosis not present

## 2022-12-27 DIAGNOSIS — E039 Hypothyroidism, unspecified: Secondary | ICD-10-CM | POA: Diagnosis not present

## 2022-12-27 DIAGNOSIS — G8929 Other chronic pain: Secondary | ICD-10-CM | POA: Diagnosis not present

## 2022-12-27 DIAGNOSIS — I10 Essential (primary) hypertension: Secondary | ICD-10-CM | POA: Diagnosis not present

## 2022-12-27 MED ORDER — FLUTICASONE-SALMETEROL 115-21 MCG/ACT IN AERO
2.0000 | INHALATION_SPRAY | Freq: Two times a day (BID) | RESPIRATORY_TRACT | 12 refills | Status: DC
Start: 2022-12-27 — End: 2023-06-09

## 2022-12-27 NOTE — Patient Instructions (Addendum)
We will check a lab test to evaluate the areas of ground glass and coughing up blood  Start advair inhaler 2 puffs twice daily - rinse mouth out after each use  Use albuterol inhaler 1-2 puffs every 4-6 hours as needed  Follow up in 2-3 months with pulmonary function tests

## 2022-12-27 NOTE — Progress Notes (Signed)
Synopsis: Referred in June 2024 for pulmonary embolism by Maryelizabeth Rowan, MD  Subjective:   PATIENT ID: Carol [name] GENDER: female DOB: 07-31-1955, MRN: [id number]   HPI  Chief Complaint  Patient presents with   Consult    Referred by PCP for abnormal CT scan back in May 2024. Was also told that she had a PE. Increased SOB for the past month. Had an episode of hemoptysis last month but has not seen any more blood since then.    Carol [name] is a 67 year old woman, never smoker with history of right breast cancer s/p lumpectomy 2022, DMII and hypertension who is referred to pulmonary clinic for pulmonary embolism.   She underwent left total knee arthroplasty on 11/04/22. She developed shortness of breath 1 week after and had CTA PE study on 5/4 which showed pulmonary emboli involving the subsegmental branches in the right lower lobe and right upper lobe. There are patchy ground glass densities in both lungs, some areas are similar to scan in 2012. She was started on eliquis. She presented to the ER 5/14 for hemoptysis which included coughing up 2 bloody mucous plugs. Chest x-ray was normal. No elevated BNP.   No history of blood clots. She continues to have exertional dyspnea. No further episodes of blood clots.   Her mother had COPD and died from pneumonia. She had second hand smoke from her mother. She is helping with her son and grandkids but has trouble keeping up due to the dyspnea.  Past Medical History:  Diagnosis Date   Anxiety    Arthritis    Balance problems    Cancer (HCC)    Chronic anxiety    Chronic depression    Chronic diarrhea    after gallbladder surgery   Chronic low back pain    Chronic seasonal allergic rhinitis    Complication of anesthesia    pt states has difficulty with being put to sleep    Dysrhythmia    Exogenous obesity    EXTERNAL HEMORRHOIDS 06/26/2007   Annotation: and internal Qualifier: Diagnosis of  By: Cheri Guppy     Falls    Family  history of adverse reaction to anesthesia    pts daughter has N&V   Family history of breast cancer    Family history of prostate cancer    Fatigue    Hemorrhoids    History of blood clots    History of blood transfusion    2006   History of bronchitis    History of chicken pox    History of frequent urinary tract infections    History of gallstones    History of jaundice    History of kidney stones    History of measles as a child    History of mumps as a child    Hypertension    Hypothyroidism    Insomnia    Night sweats    Personal history of radiation therapy    Pneumonia    hx of    Polycythemia    PONV (postoperative nausea and vomiting)    Poor sleep    chronic poor quality sleep wit snoring, sleep study in Nov 2013 showed mild central sleep apnea with hypoxia felt due to narcotics, no significant OSA or RLS   Thyroid disease    TIA (transient ischemic attack)    Tinnitus    Type II diabetes mellitus (HCC) 11/2016     Family History  Problem Relation Age  of Onset   Hypertension Mother    COPD Mother    Diabetes Mother    Heart attack Father 24   Prostate cancer Maternal Uncle 46   Myasthenia gravis Daughter    Depression Daughter    Other Son        esophageal dysmotility, [employer]   Diabetes type II Other    Other Other        premature cardiovascular disease   Breast cancer Other 60       bilateral, mother's first cousin   Prostate cancer Other        dx 59s, maternal cousin's son     Social History   Socioeconomic History   Marital status: Married    Spouse name: Not on file   Number of children: Not on file   Years of education: Not on file   Highest education level: Not on file  Occupational History   Not on file  Tobacco Use   Smoking status: Never   Smokeless tobacco: Never  Vaping Use   Vaping Use: Never used  Substance and Sexual Activity   Alcohol use: No   Drug use: No   Sexual activity: Yes    Birth control/protection:  None, Surgical  Other Topics Concern   Not on file  Social History Narrative   Not on file   Social Determinants of Health   Financial Resource Strain: Low Risk  (05/20/2021)   Overall Financial Resource Strain (CARDIA)    Difficulty of Paying Living Expenses: Not very hard  Food Insecurity: Patient Declined (11/04/2022)   Hunger Vital Sign    Worried About Running Out of Food in the Last Year: Patient declined    Ran Out of Food in the Last Year: Patient declined  Transportation Needs: No Transportation Needs (11/04/2022)   PRAPARE - Administrator, Civil Service (Medical): No    Lack of Transportation (Non-Medical): No  Physical Activity: Not on file  Stress: Stress Concern Present (05/20/2021)   [employer] of Occupational Health - Occupational Stress Questionnaire    Feeling of Stress : Very much  Social Connections: Moderately Integrated (05/20/2021)   Social Connection and Isolation Panel [NHANES]    Frequency of Communication with Friends and Family: More than three times a week    Frequency of Social Gatherings with Friends and Family: More than three times a week    Attends Religious Services: 1 to 4 times per year    Active Member of [employer] or Organizations: No    Attends Banker Meetings: Never    Marital Status: Married  Catering manager Violence: Not At Risk (11/04/2022)   Humiliation, Afraid, Rape, and Kick questionnaire    Fear of Current or Ex-Partner: No    Emotionally Abused: No    Physically Abused: No    Sexually Abused: No     Allergies  Allergen Reactions   Acetaminophen Nausea And Vomiting   Celebrex [Celecoxib] Itching   Povidone Iodine Hives    Topical    Tape     Blisters    Codeine Rash   Doxycycline Rash     Outpatient Medications Prior to Visit  Medication Sig Dispense Refill   baclofen (LIORESAL) 10 MG tablet Take 10 mg by mouth 3 (three) times daily.     docusate sodium (COLACE) 100 MG capsule Take 1  capsule (100 mg total) by mouth 2 (two) times daily as needed for mild constipation. 30 capsule 1  ELIQUIS DVT/PE STARTER PACK Take 5 mg by mouth in the morning and at bedtime.     escitalopram (LEXAPRO) 20 MG tablet Take 20 mg by mouth daily.     levothyroxine (SYNTHROID) 100 MCG tablet Take 100 mcg by mouth daily before breakfast.     methocarbamol (ROBAXIN) 500 MG tablet Take 1 tablet (500 mg total) by mouth every 8 (eight) hours as needed for muscle spasms. 30 tablet 1   oxybutynin (DITROPAN-XL) 10 MG 24 hr tablet Take 10 mg by mouth at bedtime.     oxyCODONE (OXY IR/ROXICODONE) 5 MG immediate release tablet Take 5 mg by mouth as needed.     polyethylene glycol (MIRALAX / GLYCOLAX) 17 g packet Take 17 g by mouth daily. 14 each 0   Prenatal Vit-Fe Fumarate-FA (PRENATAL MULTIVITAMIN) TABS tablet Take 1 tablet by mouth daily at 12 noon.     propranolol ER (INDERAL LA) 60 MG 24 hr capsule Take 60 mg by mouth at bedtime.     tirzepatide [facility]) 5 MG/0.5ML Pen Inject 5 mg into the skin once a week. 2 mL 4   tirzepatide (MOUNJARO) 7.5 MG/0.5ML Pen Inject 7.5 mg into the skin once a week. 2 mL 6   topiramate (TOPAMAX) 25 MG tablet Take 1 tablet (25 mg total) by mouth 2 (two) times daily. 120 tablet 3   valsartan (DIOVAN) 40 MG tablet Take 40 mg by mouth every morning.     aspirin EC 81 MG tablet Take 1 tablet (81 mg total) by mouth 2 (two) times daily after a meal. Swallow whole. 60 tablet 2   No facility-administered medications prior to visit.   Review of Systems  Constitutional:  Negative for chills, fever, malaise/fatigue and weight loss.  HENT:  Positive for congestion. Negative for sinus pain and sore throat.   Eyes: Negative.   Respiratory:  Positive for cough, hemoptysis, shortness of breath and wheezing. Negative for sputum production.   Cardiovascular:  Positive for palpitations. Negative for chest pain, orthopnea, claudication and leg swelling.  Gastrointestinal:  Negative for  abdominal pain, heartburn, nausea and vomiting.  Genitourinary: Negative.   Musculoskeletal:  Positive for joint pain. Negative for myalgias.  Skin:  Negative for rash.  Neurological:  Negative for weakness.  Endo/Heme/Allergies: Negative.   Psychiatric/Behavioral:  Positive for depression. The patient is nervous/anxious.    Objective:   Vitals:   12/27/22 0939  BP: 124/72  Pulse: 89  SpO2: 98%  Weight: 226 lb (102.5 kg)  Height: 5' 6.5" (1.689 m)   Physical Exam Constitutional:      General: She is not in acute distress.    Appearance: She is not ill-appearing.  HENT:     Head: Normocephalic and atraumatic.  Eyes:     General: No scleral icterus.    Conjunctiva/sclera: Conjunctivae normal.     Pupils: Pupils are equal, round, and reactive to light.  Cardiovascular:     Rate and Rhythm: Normal rate and regular rhythm.     Pulses: Normal pulses.     Heart sounds: Normal heart sounds. No murmur heard. Pulmonary:     Effort: Pulmonary effort is normal.     Breath sounds: Normal breath sounds. No wheezing, rhonchi or rales.  Abdominal:     General: Bowel sounds are normal.     Palpations: Abdomen is soft.  Musculoskeletal:     Right lower leg: No edema.     Left lower leg: No edema.  Lymphadenopathy:     Cervical:  No cervical adenopathy.  Skin:    General: Skin is warm and dry.  Neurological:     General: No focal deficit present.     Mental Status: She is alert.  Psychiatric:        Mood and Affect: Mood normal.        Behavior: Behavior normal.        Thought Content: Thought content normal.        Judgment: Judgment normal.    CBC    Component Value Date/Time   WBC 9.4 12/06/2022 2119   RBC 4.75 12/06/2022 2119   HGB 13.8 12/06/2022 2119   HGB 14.2 02/10/2021 1234   HCT 43.7 12/06/2022 2119   PLT 374 12/06/2022 2119   PLT 234 02/10/2021 1234   MCV 92.0 12/06/2022 2119   MCH 29.1 12/06/2022 2119   MCHC 31.6 12/06/2022 2119   RDW 15.7 (H) 12/06/2022  2119   LYMPHSABS 2.0 12/06/2022 2119   MONOABS 0.6 12/06/2022 2119   EOSABS 0.3 12/06/2022 2119   BASOSABS 0.1 12/06/2022 2119      Latest Ref Rng & Units 12/06/2022    9:19 PM 11/05/2022    3:02 AM 10/31/2022   11:06 AM  BMP  Glucose 70 - 99 mg/dL 161  096  045   BUN 8 - 23 mg/dL 23  19  21    Creatinine 0.44 - 1.00 mg/dL 4.09  8.11  9.14   Sodium 135 - 145 mmol/L 139  132  137   Potassium 3.5 - 5.1 mmol/L 3.8  4.5  3.8   Chloride 98 - 111 mmol/L 102  99  102   CO2 22 - 32 mmol/L 28  21  27    Calcium 8.9 - 10.3 mg/dL 9.7  8.7  8.7    Chest imaging: CTA Chest 11/26/22 Mediastinum/Nodes: Small mediastinal lymph nodes are minimally changed since 2012. No significant lymph node enlargement in the chest. Surgical clips in the right breast. No significant axillary lymph node enlargement.   Lungs/Pleura: Trachea and mainstem bronchi are patent. Patchy ground-glass densities in both lungs. Some of these findings are similar to the exam in 2012 and could represent areas of air trapping or atelectasis. Difficult to exclude mild edema. No large areas of airspace disease or consolidation. No pleural effusions.  PFT:     No data to display          Labs:  Path:  Echo:  Heart Catheterization:     Assessment & Plan:   Multiple subsegmental pulmonary emboli without acute cor pulmonale (HCC)  Hemoptysis - Plan: ANCA screen with reflex titer  Small airways disease - Plan: fluticasone-salmeterol (ADVAIR HFA) 115-21 MCG/ACT inhaler, Pulmonary Function Test  Discussion: Carol [name] is a 67 year old woman, never smoker with history of right breast cancer s/p lumpectomy 2022, DMII and hypertension who is referred to pulmonary clinic for pulmonary embolism.  She developed provoked pulmonary embolism after left total knee arthroplasty. She is to continue eliquis therapy for 3- 6 months in total. She has been referred to hematology for further evaluation.   Her ground glass infiltrates  on CT imaging is most likely related to atelectasis as this was present on prior scan in 2012. Other differential could include vasculitis or small airways disease.  Check ANCA screen.   Try advair inhaler 2 puffs twice daily for possible reactive airways given her history of second hand smoke.  Follow up in 2-3 months for pulmonary function tests.  Melody Comas,  MD [facility] Office: [phone]   Current Outpatient Medications:    baclofen (LIORESAL) 10 MG tablet, Take 10 mg by mouth 3 (three) times daily., Disp: , Rfl:    docusate sodium (COLACE) 100 MG capsule, Take 1 capsule (100 mg total) by mouth 2 (two) times daily as needed for mild constipation., Disp: 30 capsule, Rfl: 1   ELIQUIS DVT/PE STARTER PACK, Take 5 mg by mouth in the morning and at bedtime., Disp: , Rfl:    escitalopram (LEXAPRO) 20 MG tablet, Take 20 mg by mouth daily., Disp: , Rfl:    fluticasone-salmeterol (ADVAIR HFA) 115-21 MCG/ACT inhaler, Inhale 2 puffs into the lungs 2 (two) times daily., Disp: 1 each, Rfl: 12   levothyroxine (SYNTHROID) 100 MCG tablet, Take 100 mcg by mouth daily before breakfast., Disp: , Rfl:    methocarbamol (ROBAXIN) 500 MG tablet, Take 1 tablet (500 mg total) by mouth every 8 (eight) hours as needed for muscle spasms., Disp: 30 tablet, Rfl: 1   oxybutynin (DITROPAN-XL) 10 MG 24 hr tablet, Take 10 mg by mouth at bedtime., Disp: , Rfl:    oxyCODONE (OXY IR/ROXICODONE) 5 MG immediate release tablet, Take 5 mg by mouth as needed., Disp: , Rfl:    polyethylene glycol (MIRALAX / GLYCOLAX) 17 g packet, Take 17 g by mouth daily., Disp: 14 each, Rfl: 0   Prenatal Vit-Fe Fumarate-FA (PRENATAL MULTIVITAMIN) TABS tablet, Take 1 tablet by mouth daily at 12 noon., Disp: , Rfl:    propranolol ER (INDERAL LA) 60 MG 24 hr capsule, Take 60 mg by mouth at bedtime., Disp: , Rfl:    tirzepatide (MOUNJARO) 5 MG/0.5ML Pen, Inject 5 mg into the skin once a week., Disp: 2 mL, Rfl: 4    tirzepatide (MOUNJARO) 7.5 MG/0.5ML Pen, Inject 7.5 mg into the skin once a week., Disp: 2 mL, Rfl: 6   topiramate (TOPAMAX) 25 MG tablet, Take 1 tablet (25 mg total) by mouth 2 (two) times daily., Disp: 120 tablet, Rfl: 3   valsartan (DIOVAN) 40 MG tablet, Take 40 mg by mouth every morning., Disp: , Rfl:

## 2023-01-01 LAB — ANCA SCREEN W REFLEX TITER: ANCA SCREEN: NEGATIVE

## 2023-01-03 ENCOUNTER — Institutional Professional Consult (permissible substitution): Payer: Medicare Other | Admitting: Pulmonary Disease

## 2023-01-06 DIAGNOSIS — R519 Headache, unspecified: Secondary | ICD-10-CM | POA: Diagnosis not present

## 2023-01-06 DIAGNOSIS — G43109 Migraine with aura, not intractable, without status migrainosus: Secondary | ICD-10-CM | POA: Diagnosis not present

## 2023-01-06 DIAGNOSIS — H1132 Conjunctival hemorrhage, left eye: Secondary | ICD-10-CM | POA: Diagnosis not present

## 2023-01-07 DIAGNOSIS — H1132 Conjunctival hemorrhage, left eye: Secondary | ICD-10-CM | POA: Diagnosis not present

## 2023-01-13 DIAGNOSIS — I2699 Other pulmonary embolism without acute cor pulmonale: Secondary | ICD-10-CM | POA: Diagnosis not present

## 2023-01-13 DIAGNOSIS — R61 Generalized hyperhidrosis: Secondary | ICD-10-CM | POA: Diagnosis not present

## 2023-01-13 DIAGNOSIS — F331 Major depressive disorder, recurrent, moderate: Secondary | ICD-10-CM | POA: Diagnosis not present

## 2023-01-13 DIAGNOSIS — F411 Generalized anxiety disorder: Secondary | ICD-10-CM | POA: Diagnosis not present

## 2023-01-13 DIAGNOSIS — E039 Hypothyroidism, unspecified: Secondary | ICD-10-CM | POA: Diagnosis not present

## 2023-01-13 DIAGNOSIS — I10 Essential (primary) hypertension: Secondary | ICD-10-CM | POA: Diagnosis not present

## 2023-01-13 DIAGNOSIS — G4700 Insomnia, unspecified: Secondary | ICD-10-CM | POA: Diagnosis not present

## 2023-01-17 DIAGNOSIS — M4726 Other spondylosis with radiculopathy, lumbar region: Secondary | ICD-10-CM | POA: Diagnosis not present

## 2023-01-17 DIAGNOSIS — M5136 Other intervertebral disc degeneration, lumbar region: Secondary | ICD-10-CM | POA: Diagnosis not present

## 2023-01-17 DIAGNOSIS — M961 Postlaminectomy syndrome, not elsewhere classified: Secondary | ICD-10-CM | POA: Diagnosis not present

## 2023-01-17 DIAGNOSIS — Z5181 Encounter for therapeutic drug level monitoring: Secondary | ICD-10-CM | POA: Diagnosis not present

## 2023-01-17 DIAGNOSIS — M5451 Vertebrogenic low back pain: Secondary | ICD-10-CM | POA: Diagnosis not present

## 2023-01-17 DIAGNOSIS — M4810 Ankylosing hyperostosis [Forestier], site unspecified: Secondary | ICD-10-CM | POA: Diagnosis not present

## 2023-01-17 DIAGNOSIS — Z981 Arthrodesis status: Secondary | ICD-10-CM | POA: Diagnosis not present

## 2023-01-17 DIAGNOSIS — Z79899 Other long term (current) drug therapy: Secondary | ICD-10-CM | POA: Diagnosis not present

## 2023-01-21 NOTE — Progress Notes (Signed)
Patient Care Team: Lewis Moccasin, MD as PCP - General (Family Medicine) Jake Bathe, MD as PCP - Cardiology (Cardiology) Almond Lint, MD as Consulting Physician (General Surgery) Serena Croissant, MD as Consulting Physician (Hematology and Oncology) Lonie Peak, MD as Attending Physician (Radiation Oncology)  DIAGNOSIS:  Encounter Diagnoses  Name Primary?   Single subsegmental pulmonary embolism without acute cor pulmonale (HCC) Yes   Ductal carcinoma in situ (DCIS) of right breast     SUMMARY OF ONCOLOGIC HISTORY: Oncology History  Ductal carcinoma in situ (DCIS) of right breast  02/02/2021 Initial Diagnosis   Screening mammogram on 01/10/21 showed a possible mass and calcifications in the right breast. Diagnostic mammogram and [place] on 02/02/21 showed suspicious right breast calcifications and right breast cysts. Biopsy on 02/04/21 showed DCIS with necrosis and calcifications ER+ (95%)/ PR+ (30%).   02/18/2021 Genetic Testing   Negative genetic testing:  No pathogenic variants detected on the [facility] BRCAplus panel (report date 02/18/2021) or CancerNext-Expanded + RNAinsight panel (report date 02/20/2021).   The BRCAplus panel offered by [employer] and includes sequencing and deletion/duplication analysis for the following 8 genes: ATM, BRCA1, BRCA2, CDH1, CHEK2, PALB2, PTEN, and TP53. The CancerNext-Expanded + RNAinsight gene panel offered by [employer] and includes sequencing and rearrangement analysis for the following 77 genes: AIP, ALK, APC, ATM, AXIN2, BAP1, BARD1, BLM, BMPR1A, BRCA1, BRCA2, BRIP1, CDC73, CDH1, CDK4, CDKN1B, CDKN2A, CHEK2, CTNNA1, DICER1, FANCC, FH, FLCN, GALNT12, KIF1B, LZTR1, MAX, MEN1, MET, MLH1, MSH2, MSH3, MSH6, MUTYH, NBN, NF1, NF2, NTHL1, PALB2, PHOX2B, PMS2, POT1, PRKAR1A, PTCH1, PTEN, RAD51C, RAD51D, RB1, RECQL, RET, SDHA, SDHAF2, SDHB, SDHC, SDHD, SMAD4, SMARCA4, SMARCB1, SMARCE1, STK11, SUFU, TMEM127, TP53, TSC1, TSC2, VHL and XRCC2 (sequencing  and deletion/duplication); EGFR, EGLN1, HOXB13, KIT, MITF, PDGFRA, POLD1 and POLE (sequencing only); EPCAM and GREM1 (deletion/duplication only). RNA data is routinely analyzed for use in variant interpretation for all genes.   03/25/2021 Surgery   Breast conserving surgery: 03/25/2021: Right lumpectomy: Intermediate grade high-grade DCIS 0.7 cm, involves the posterior margin.  Right lateral lumpectomy: Benign, ER greater than 95%, PR 30% Patient will undergo additional surgery for clearance of the posterior margin.    05/27/2021 - 06/25/2021 Radiation Therapy    Followed by adjuvant radiation therapy 05/27/2021-06/25/2021   09/13/2021 Cancer Staging   Staging form: Breast, AJCC 8th Edition - Pathologic: Stage 0 (pTis (DCIS), pN0, cM0, ER+, PR+) - Signed by Loa Socks, NP on 09/13/2021 Stage prefix: Initial diagnosis     CHIEF COMPLIANT: Follow-up of right breast cancer   INTERVAL HISTORY: Carol [name] is a 67 y.o. with above-mentioned history of right breast DCIS having undergone lumpectomy. She presents to the clinic today for follow-up. Pt reports dizziness after knee surgery. She also had a blood clot in chest after diagnose with pneumonia. She says she was coughing up blood. Complains of night sweats.     ALLERGIES:  is allergic to methocarbamol, acetaminophen, celebrex [celecoxib], povidone iodine, tape, codeine, and doxycycline.  MEDICATIONS:  Current Outpatient Medications  Medication Sig Dispense Refill   albuterol (VENTOLIN HFA) 108 (90 Base) MCG/ACT inhaler SMARTSIG:1-2 Puff(s) Via Inhaler Every 4 Hours PRN     azithromycin (ZITHROMAX) 250 MG tablet TK 2 TS PO ON DAY 1, THEN TK 1 T PO D FOR 4 DAYS     baclofen (LIORESAL) 10 MG tablet Take 10 mg by mouth 3 (three) times daily.     buPROPion (WELLBUTRIN XL) 150 MG 24 hr tablet Take 150 mg by  mouth every morning.     docusate sodium (COLACE) 100 MG capsule Take 1 capsule (100 mg total) by mouth 2 (two) times daily as  needed for mild constipation. 30 capsule 1   escitalopram (LEXAPRO) 20 MG tablet Take 20 mg by mouth daily.     fluticasone-salmeterol (ADVAIR HFA) 115-21 MCG/ACT inhaler Inhale 2 puffs into the lungs 2 (two) times daily. 1 each 12   levothyroxine (SYNTHROID) 100 MCG tablet Take 100 mcg by mouth daily before breakfast.     methocarbamol (ROBAXIN) 500 MG tablet Take 1 tablet (500 mg total) by mouth every 8 (eight) hours as needed for muscle spasms. 30 tablet 1   NEURONTIN 300 MG capsule Take 1 capsule at bedtime for 3 days, then 1 capsule twice a day for 3 days, then 1 capsule 3 times a day     oxybutynin (DITROPAN-XL) 10 MG 24 hr tablet Take 10 mg by mouth at bedtime.     oxyCODONE (OXY IR/ROXICODONE) 5 MG immediate release tablet Take 5 mg by mouth as needed.     polyethylene glycol (MIRALAX / GLYCOLAX) 17 g packet Take 17 g by mouth daily. 14 each 0   Prenatal Vit-Fe Fumarate-FA (PRENATAL MULTIVITAMIN) TABS tablet Take 1 tablet by mouth daily at 12 noon.     propranolol ER (INDERAL LA) 60 MG 24 hr capsule Take 60 mg by mouth at bedtime.     Pseudoephedrine-Acetaminophen (SM NON-ASPRIN SINUS PO) Take 81 mg by mouth.     RIVAROXABAN (XARELTO) VTE STARTER PACK (15 & 20 MG) Follow package directions: Take one 15mg  tablet by mouth twice a day. On day 22, switch to one 20mg  tablet once a day. Take with food. 51 each 0   tirzepatide (MOUNJARO) 5 MG/0.5ML Pen Inject 5 mg into the skin once a week. 2 mL 4   tirzepatide (MOUNJARO) 7.5 MG/0.5ML Pen Inject 7.5 mg into the skin once a week. 2 mL 6   topiramate (TOPAMAX) 25 MG tablet Take 1 tablet (25 mg total) by mouth 2 (two) times daily. 120 tablet 3   valsartan (DIOVAN) 40 MG tablet Take 40 mg by mouth every morning.     No current facility-administered medications for this visit.    PHYSICAL EXAMINATION: ECOG PERFORMANCE STATUS: 1 - Symptomatic but completely ambulatory  Vitals:   01/25/23 1153  BP: (!) 148/87  Pulse: 78  Resp: 18  Temp: (!)  97.5 F (36.4 C)  SpO2: 97%   Filed Weights   01/25/23 1153  Weight: 226 lb 11.2 oz (102.8 kg)     LABORATORY DATA:  I have reviewed the data as listed    Latest Ref Rng & Units 01/25/2023   10:44 AM 12/06/2022    9:19 PM 11/05/2022    3:02 AM  CMP  Glucose 70 - 99 mg/dL 161  096  045   BUN 8 - 23 mg/dL 9  23  19    Creatinine 0.44 - 1.00 mg/dL 4.09  8.11  9.14   Sodium 135 - 145 mmol/L 140  139  132   Potassium 3.5 - 5.1 mmol/L 3.8  3.8  4.5   Chloride 98 - 111 mmol/L 104  102  99   CO2 22 - 32 mmol/L 30  28  21    Calcium 8.9 - 10.3 mg/dL 9.2  9.7  8.7   Total Protein 6.5 - 8.1 g/dL 6.7     Total Bilirubin 0.3 - 1.2 mg/dL 0.7     Alkaline Phos  38 - 126 U/L 76     AST 15 - 41 U/L 15     ALT 0 - 44 U/L 9       Lab Results  Component Value Date   WBC 4.7 01/25/2023   HGB 12.5 01/25/2023   HCT 39.2 01/25/2023   MCV 91.8 01/25/2023   PLT 288 01/25/2023   NEUTROABS 3.2 01/25/2023    ASSESSMENT & PLAN:  Pulmonary embolism (HCC) 11/26/22: PE in Right Pulmonary Artery, Eliquis 12/06/22: ED visit: Hemoptysis  Probable cause for PE: Multiple surgeries on knees  Ductal carcinoma in situ (DCIS) of right breast 02/02/2021:Screening mammogram on 01/10/21 showed a possible mass and calcifications in the right breast. Diagnostic mammogram and [place] on 02/02/21 showed suspicious right breast calcifications and right breast cysts. Biopsy on 02/04/21 showed DCIS with necrosis and calcifications ER+ (95%)/ PR+ (30%).   Recommendation: 1. Breast conserving surgery: 03/25/2021: Right lumpectomy: Intermediate grade high-grade DCIS 0.7 cm, involves the posterior margin.  Right lateral lumpectomy: Benign, ER greater than 95%, PR 30% Patient will undergo additional surgery for clearance of the posterior margin. 2. Followed by adjuvant radiation therapy 05/27/2021-06/25/2021 3. Tamoxifen not recommended because of high risk of clots and PE diagnosed  11/26/22 ----------------------------------------------------------------------------------------------------------------------- Acute PE 11/26/2022: She was started on Eliquis which was discontinued because of a blood vessel rupture on the sclera.  Since the blood vessel has healed, I recommended that she start back on anticoagulation.  Because the Eliquis was very expensive we will be switching her to Xarelto.  If the Xarelto is also very expensive then we may have to use Coumadin.  Duration of treatment: 3 to 6 months. Will obtain D-dimer in 3 months and make a determination of continuation of anticoagulation or stopping it at that time. Because of the blood clot: Recent knee replacement surgery. Patient is currently on Mounjaro and has been losing significant amount of weight.   No role of antiestrogen therapy because she is at high risk for blood clots.    Orders Placed This Encounter  Procedures   D-dimer, quantitative    Standing Status:   Future    Standing Expiration Date:   01/25/2024   The patient has a good understanding of the overall plan. she agrees with it. she will call with any problems that may develop before the next visit here. Total time spent: 30 mins including face to face time and time spent for planning, charting and co-ordination of care   Tamsen Meek, MD 01/25/23    I Janan Ridge am acting as a Neurosurgeon for [employer]  I have reviewed the above documentation for accuracy and completeness, and I agree with the above.

## 2023-01-24 ENCOUNTER — Other Ambulatory Visit: Payer: Self-pay

## 2023-01-25 ENCOUNTER — Other Ambulatory Visit: Payer: Self-pay

## 2023-01-25 ENCOUNTER — Other Ambulatory Visit: Payer: Self-pay | Admitting: Hematology and Oncology

## 2023-01-25 ENCOUNTER — Ambulatory Visit
Admission: RE | Admit: 2023-01-25 | Discharge: 2023-01-25 | Disposition: A | Discharge status: Home / Self Care | Payer: Medicare Other | Source: Ambulatory Visit | Attending: Nurse Practitioner | Admitting: Nurse Practitioner

## 2023-01-25 ENCOUNTER — Telehealth: Payer: Self-pay

## 2023-01-25 ENCOUNTER — Inpatient Hospital Stay: Payer: Medicare Other | Attending: Hematology and Oncology

## 2023-01-25 ENCOUNTER — Inpatient Hospital Stay (HOSPITAL_BASED_OUTPATIENT_CLINIC_OR_DEPARTMENT_OTHER): Payer: Medicare Other | Admitting: Hematology and Oncology

## 2023-01-25 VITALS — BP 148/87 | HR 78 | Temp 97.5°F | Resp 18 | Ht 66.5 in | Wt 226.7 lb

## 2023-01-25 DIAGNOSIS — I2693 Single subsegmental pulmonary embolism without acute cor pulmonale: Secondary | ICD-10-CM | POA: Insufficient documentation

## 2023-01-25 DIAGNOSIS — Z08 Encounter for follow-up examination after completed treatment for malignant neoplasm: Secondary | ICD-10-CM | POA: Diagnosis not present

## 2023-01-25 DIAGNOSIS — Z853 Personal history of malignant neoplasm of breast: Secondary | ICD-10-CM | POA: Insufficient documentation

## 2023-01-25 DIAGNOSIS — D0511 Intraductal carcinoma in situ of right breast: Secondary | ICD-10-CM

## 2023-01-25 DIAGNOSIS — Z9889 Other specified postprocedural states: Secondary | ICD-10-CM

## 2023-01-25 DIAGNOSIS — N644 Mastodynia: Secondary | ICD-10-CM

## 2023-01-25 LAB — CMP (CANCER CENTER ONLY)
ALT: 9 U/L (ref 0–44)
AST: 15 U/L (ref 15–41)
Albumin: 3.7 g/dL (ref 3.5–5.0)
Alkaline Phosphatase: 76 U/L (ref 38–126)
Anion gap: 6 (ref 5–15)
BUN: 9 mg/dL (ref 8–23)
CO2: 30 mmol/L (ref 22–32)
Calcium: 9.2 mg/dL (ref 8.9–10.3)
Chloride: 104 mmol/L (ref 98–111)
Creatinine: 0.95 mg/dL (ref 0.44–1.00)
GFR, Estimated: >60 mL/min (ref >60)
Glucose, Bld: 110 mg/dL — ABNORMAL HIGH (ref 70–99)
Potassium: 3.8 mmol/L (ref 3.5–5.1)
Sodium: 140 mmol/L (ref 135–145)
Total Bilirubin: 0.7 mg/dL (ref 0.3–1.2)
Total Protein: 6.7 g/dL (ref 6.5–8.1)

## 2023-01-25 LAB — CBC WITH DIFFERENTIAL (CANCER CENTER ONLY)
Abs Immature Granulocytes: 0.03 10*3/uL (ref 0.00–0.07)
Basophils Absolute: 0 10*3/uL (ref 0.0–0.1)
Basophils Relative: 1 %
Eosinophils Absolute: 0.2 10*3/uL (ref 0.0–0.5)
Eosinophils Relative: 4 %
HCT: 39.2 % (ref 36.0–46.0)
Hemoglobin: 12.5 g/dL (ref 12.0–15.0)
Immature Granulocytes: 1 %
Lymphocytes Relative: 20 %
Lymphs Abs: 0.9 10*3/uL (ref 0.7–4.0)
MCH: 29.3 pg (ref 26.0–34.0)
MCHC: 31.9 g/dL (ref 30.0–36.0)
MCV: 91.8 fL (ref 80.0–100.0)
Monocytes Absolute: 0.4 10*3/uL (ref 0.1–1.0)
Monocytes Relative: 8 %
Neutro Abs: 3.2 10*3/uL (ref 1.7–7.7)
Neutrophils Relative %: 66 %
Platelet Count: 288 10*3/uL (ref 150–400)
RBC: 4.27 MIL/uL (ref 3.87–5.11)
RDW: 13.3 % (ref 11.5–15.5)
WBC Count: 4.7 10*3/uL (ref 4.0–10.5)
nRBC: 0 % (ref 0.0–0.2)

## 2023-01-25 MED ORDER — WARFARIN SODIUM 5 MG PO TABS: 5.0000 mg | ORAL_TABLET | Freq: Every day | ORAL | 0 refills | Status: DC

## 2023-01-25 MED ORDER — RIVAROXABAN 15 MG PO TABS: 15.0000 mg | ORAL_TABLET | Freq: Two times a day (BID) | ORAL | 0 refills | Status: DC

## 2023-01-25 MED ORDER — RIVAROXABAN (XARELTO) VTE STARTER PACK (15 & 20 MG): ORAL_TABLET | ORAL | 0 refills | Status: DC

## 2023-01-25 NOTE — Assessment & Plan Note (Addendum)
02/02/2021:Screening mammogram on 01/10/21 showed a possible mass and calcifications in the right breast. Diagnostic mammogram and [place] on 02/02/21 showed suspicious right breast calcifications and right breast cysts. Biopsy on 02/04/21 showed DCIS with necrosis and calcifications ER+ (95%)/ PR+ (30%).   Recommendation: 1. Breast conserving surgery: 03/25/2021: Right lumpectomy: Intermediate grade high-grade DCIS 0.7 cm, involves the posterior margin.  Right lateral lumpectomy: Benign, ER greater than 95%, PR 30% Patient will undergo additional surgery for clearance of the posterior margin. 2. Followed by adjuvant radiation therapy 05/27/2021-06/25/2021 3. Tamoxifen not recommended because of high risk of clots and PE diagnosed 11/26/22 ----------------------------------------------------------------------------------------------------------------------- Acute PE 11/26/2022: She was started on Eliquis which was discontinued because of a blood vessel rupture on the sclera.  Since the blood vessel has healed, I recommended that she start back on anticoagulation.  Because the Eliquis was very expensive we will be switching her to Xarelto.  If the Xarelto is also very expensive then we may have to use Coumadin.  Duration of treatment: 3 to 6 months. Will obtain D-dimer in 3 months and make a determination of continuation of anticoagulation or stopping it at that time. Because of the blood clot: Recent knee replacement surgery. Patient is currently on Mounjaro and has been losing significant amount of weight.   No role of antiestrogen therapy because she is at high risk for blood clots.

## 2023-01-25 NOTE — Assessment & Plan Note (Addendum)
11/26/22: PE in Right Pulmonary Artery, Eliquis 12/06/22: ED visit: Hemoptysis  Probable cause for PE: Multiple surgeries on knees

## 2023-01-25 NOTE — Progress Notes (Signed)
Received message from patient's pharmacy that Xarelto started pack is not covered by [employer] and is costly. Recommended ordering 15mg  tabs and 20mg  tabs separately. Order placed for Xarelto 15mg  tab x21 days.

## 2023-01-25 NOTE — Telephone Encounter (Signed)
Called Pt regarding anticoagulation rx changes. Per MD, xarelto order d/c'd due to cost and order placed for coumadin 5mg  tab daily. Per MD no lovenox injections necessary, but Pt will need lab appt in 1 week. Lab appt scheduled. Pt verbalized understanding.

## 2023-01-31 ENCOUNTER — Other Ambulatory Visit: Payer: Self-pay | Admitting: *Deleted

## 2023-01-31 ENCOUNTER — Telehealth: Payer: Self-pay | Admitting: Hematology and Oncology

## 2023-01-31 DIAGNOSIS — D0511 Intraductal carcinoma in situ of right breast: Secondary | ICD-10-CM

## 2023-01-31 NOTE — Telephone Encounter (Signed)
Patient had a scheduling conflict and was unable to make it to original date; patient is aware of rescheduled appointment dates/times

## 2023-02-01 ENCOUNTER — Inpatient Hospital Stay: Payer: Medicare Other

## 2023-02-06 ENCOUNTER — Inpatient Hospital Stay: Payer: Medicare Other

## 2023-02-13 DIAGNOSIS — G8929 Other chronic pain: Secondary | ICD-10-CM | POA: Diagnosis not present

## 2023-02-13 DIAGNOSIS — F331 Major depressive disorder, recurrent, moderate: Secondary | ICD-10-CM | POA: Diagnosis not present

## 2023-02-13 DIAGNOSIS — I10 Essential (primary) hypertension: Secondary | ICD-10-CM | POA: Diagnosis not present

## 2023-02-13 DIAGNOSIS — F411 Generalized anxiety disorder: Secondary | ICD-10-CM | POA: Diagnosis not present

## 2023-02-13 DIAGNOSIS — G4700 Insomnia, unspecified: Secondary | ICD-10-CM | POA: Diagnosis not present

## 2023-02-13 DIAGNOSIS — E669 Obesity, unspecified: Secondary | ICD-10-CM | POA: Diagnosis not present

## 2023-02-13 DIAGNOSIS — R232 Flushing: Secondary | ICD-10-CM | POA: Diagnosis not present

## 2023-02-22 ENCOUNTER — Telehealth: Payer: Self-pay

## 2023-02-22 ENCOUNTER — Other Ambulatory Visit: Payer: Self-pay | Admitting: Hematology and Oncology

## 2023-02-22 NOTE — Telephone Encounter (Signed)
Called and LVM regarding coumadin rx refill request. Per chart, Pt was no show to 02/06/23 lab appt. Per 01/25/23 note, Pt was to have repeat lab appt in one week for lab work. Advised Pt that we cannot refill until repeat labs are drawn and resulted. Asked for call back at earliest convenience.

## 2023-02-23 ENCOUNTER — Telehealth: Payer: Self-pay

## 2023-02-23 DIAGNOSIS — I2693 Single subsegmental pulmonary embolism without acute cor pulmonale: Secondary | ICD-10-CM

## 2023-02-23 DIAGNOSIS — I2699 Other pulmonary embolism without acute cor pulmonale: Secondary | ICD-10-CM

## 2023-02-23 NOTE — Telephone Encounter (Signed)
Called Pt's son who was with Pt. Advised pt that we cannot refill coumadin until labs have been drawn. Pt [place] she is at the beach but will be back this weekend. Lab appt scheduled for 02/27/23. Pt verbalized understanding.

## 2023-02-27 ENCOUNTER — Inpatient Hospital Stay: Payer: Medicare Other

## 2023-02-28 ENCOUNTER — Other Ambulatory Visit: Payer: Self-pay

## 2023-02-28 ENCOUNTER — Inpatient Hospital Stay: Payer: Medicare Other

## 2023-02-28 MED ORDER — WARFARIN SODIUM 5 MG PO TABS: 5.0000 mg | ORAL_TABLET | Freq: Every day | ORAL | 0 refills | Status: DC

## 2023-02-28 NOTE — Telephone Encounter (Signed)
Pt called to let us know that she will not be able to come in today for labs. Her car is in the shop and she does not have a means for transportation. She has agreed to come in next Tuesday 03/07/23 at 1PM for labs to reassess PT/INR. Per MD, we will refill her coumadin today because she took her last tab last night.

## 2023-03-07 ENCOUNTER — Other Ambulatory Visit: Payer: Self-pay

## 2023-03-07 ENCOUNTER — Inpatient Hospital Stay: Payer: Medicare Other | Attending: Hematology and Oncology

## 2023-03-07 DIAGNOSIS — I2693 Single subsegmental pulmonary embolism without acute cor pulmonale: Secondary | ICD-10-CM | POA: Diagnosis not present

## 2023-03-07 DIAGNOSIS — I2699 Other pulmonary embolism without acute cor pulmonale: Secondary | ICD-10-CM

## 2023-03-07 DIAGNOSIS — Z7901 Long term (current) use of anticoagulants: Secondary | ICD-10-CM | POA: Diagnosis not present

## 2023-03-07 DIAGNOSIS — D0511 Intraductal carcinoma in situ of right breast: Secondary | ICD-10-CM

## 2023-03-07 LAB — CBC WITH DIFFERENTIAL (CANCER CENTER ONLY)
Abs Immature Granulocytes: 0.01 10*3/uL (ref 0.00–0.07)
Basophils Absolute: 0 10*3/uL (ref 0.0–0.1)
Basophils Relative: 1 %
Eosinophils Absolute: 0.1 10*3/uL (ref 0.0–0.5)
Eosinophils Relative: 2 %
HCT: 40.5 % (ref 36.0–46.0)
Hemoglobin: 12.9 g/dL (ref 12.0–15.0)
Immature Granulocytes: 0 %
Lymphocytes Relative: 18 %
Lymphs Abs: 1 10*3/uL (ref 0.7–4.0)
MCH: 28.4 pg (ref 26.0–34.0)
MCHC: 31.9 g/dL (ref 30.0–36.0)
MCV: 89 fL (ref 80.0–100.0)
Monocytes Absolute: 0.3 10*3/uL (ref 0.1–1.0)
Monocytes Relative: 6 %
Neutro Abs: 3.9 10*3/uL (ref 1.7–7.7)
Neutrophils Relative %: 73 %
Platelet Count: 303 10*3/uL (ref 150–400)
RBC: 4.55 MIL/uL (ref 3.87–5.11)
RDW: 13.2 % (ref 11.5–15.5)
WBC Count: 5.3 10*3/uL (ref 4.0–10.5)
nRBC: 0 % (ref 0.0–0.2)

## 2023-03-07 LAB — CMP (CANCER CENTER ONLY)
ALT: 10 U/L (ref 0–44)
AST: 17 U/L (ref 15–41)
Albumin: 3.9 g/dL (ref 3.5–5.0)
Alkaline Phosphatase: 88 U/L (ref 38–126)
Anion gap: 8 (ref 5–15)
BUN: 11 mg/dL (ref 8–23)
CO2: 28 mmol/L (ref 22–32)
Calcium: 9 mg/dL (ref 8.9–10.3)
Chloride: 104 mmol/L (ref 98–111)
Creatinine: 0.93 mg/dL (ref 0.44–1.00)
GFR, Estimated: >60 mL/min (ref >60)
Glucose, Bld: 118 mg/dL — ABNORMAL HIGH (ref 70–99)
Potassium: 4 mmol/L (ref 3.5–5.1)
Sodium: 140 mmol/L (ref 135–145)
Total Bilirubin: 0.5 mg/dL (ref 0.3–1.2)
Total Protein: 7.4 g/dL (ref 6.5–8.1)

## 2023-03-07 LAB — PROTIME-INR
INR: 1.5 — ABNORMAL HIGH (ref 0.8–1.2)
Prothrombin Time: 18.1 seconds — ABNORMAL HIGH (ref 11.4–15.2)

## 2023-03-14 DIAGNOSIS — Z96652 Presence of left artificial knee joint: Secondary | ICD-10-CM | POA: Diagnosis not present

## 2023-03-14 DIAGNOSIS — Z96659 Presence of unspecified artificial knee joint: Secondary | ICD-10-CM | POA: Diagnosis not present

## 2023-03-20 DIAGNOSIS — M47896 Other spondylosis, lumbar region: Secondary | ICD-10-CM | POA: Diagnosis not present

## 2023-03-20 DIAGNOSIS — M5136 Other intervertebral disc degeneration, lumbar region: Secondary | ICD-10-CM | POA: Diagnosis not present

## 2023-03-20 DIAGNOSIS — Z981 Arthrodesis status: Secondary | ICD-10-CM | POA: Diagnosis not present

## 2023-03-20 DIAGNOSIS — M961 Postlaminectomy syndrome, not elsewhere classified: Secondary | ICD-10-CM | POA: Diagnosis not present

## 2023-03-20 DIAGNOSIS — M5451 Vertebrogenic low back pain: Secondary | ICD-10-CM | POA: Diagnosis not present

## 2023-03-20 DIAGNOSIS — M5416 Radiculopathy, lumbar region: Secondary | ICD-10-CM | POA: Diagnosis not present

## 2023-03-28 DIAGNOSIS — B9689 Other specified bacterial agents as the cause of diseases classified elsewhere: Secondary | ICD-10-CM | POA: Diagnosis not present

## 2023-03-28 DIAGNOSIS — J069 Acute upper respiratory infection, unspecified: Secondary | ICD-10-CM | POA: Diagnosis not present

## 2023-03-28 DIAGNOSIS — G8929 Other chronic pain: Secondary | ICD-10-CM | POA: Diagnosis not present

## 2023-03-28 DIAGNOSIS — J329 Chronic sinusitis, unspecified: Secondary | ICD-10-CM | POA: Diagnosis not present

## 2023-04-21 ENCOUNTER — Emergency Department (HOSPITAL_BASED_OUTPATIENT_CLINIC_OR_DEPARTMENT_OTHER)
Admission: EM | Admit: 2023-04-21 | Discharge: 2023-04-22 | Disposition: A | Discharge status: Home / Self Care | Payer: Medicare Other | Attending: Emergency Medicine | Admitting: Emergency Medicine

## 2023-04-21 ENCOUNTER — Other Ambulatory Visit: Payer: Self-pay

## 2023-04-21 DIAGNOSIS — R109 Unspecified abdominal pain: Secondary | ICD-10-CM | POA: Diagnosis not present

## 2023-04-21 DIAGNOSIS — R1013 Epigastric pain: Secondary | ICD-10-CM | POA: Insufficient documentation

## 2023-04-21 DIAGNOSIS — E119 Type 2 diabetes mellitus without complications: Secondary | ICD-10-CM | POA: Insufficient documentation

## 2023-04-21 DIAGNOSIS — R1012 Left upper quadrant pain: Secondary | ICD-10-CM | POA: Diagnosis not present

## 2023-04-21 DIAGNOSIS — R110 Nausea: Secondary | ICD-10-CM | POA: Insufficient documentation

## 2023-04-21 DIAGNOSIS — Z79899 Other long term (current) drug therapy: Secondary | ICD-10-CM | POA: Diagnosis not present

## 2023-04-21 DIAGNOSIS — Z7901 Long term (current) use of anticoagulants: Secondary | ICD-10-CM | POA: Diagnosis not present

## 2023-04-21 DIAGNOSIS — I10 Essential (primary) hypertension: Secondary | ICD-10-CM | POA: Insufficient documentation

## 2023-04-21 DIAGNOSIS — Z7982 Long term (current) use of aspirin: Secondary | ICD-10-CM | POA: Insufficient documentation

## 2023-04-21 DIAGNOSIS — R1032 Left lower quadrant pain: Secondary | ICD-10-CM | POA: Diagnosis not present

## 2023-04-21 DIAGNOSIS — E039 Hypothyroidism, unspecified: Secondary | ICD-10-CM | POA: Diagnosis not present

## 2023-04-21 NOTE — ED Triage Notes (Signed)
Pt to triage c/o abd pain LUQ 6/10 sharp in nature worse after eating. PT states she has been taking Mounjaro x 2 years. Pt denies fever or chills. Last BM 48 hours prior. VSS NAD PT on room air. EKG completed, Labs drawn

## 2023-04-21 NOTE — ED Notes (Addendum)
Patient reports LLQ abdominal pain for one week that worsens with eating. Patient also reports indigestion for one week. Patient reports that she has not taken Mounjaro in two weeks.

## 2023-04-22 DIAGNOSIS — R1013 Epigastric pain: Secondary | ICD-10-CM | POA: Diagnosis not present

## 2023-04-22 LAB — URINALYSIS, ROUTINE W REFLEX MICROSCOPIC
Bilirubin Urine: NEGATIVE
Glucose, UA: NEGATIVE mg/dL
Hgb urine dipstick: NEGATIVE
Leukocytes,Ua: NEGATIVE
Nitrite: NEGATIVE
Protein, ur: NEGATIVE mg/dL
Specific Gravity, Urine: 1.017 (ref 1.005–1.030)
pH: 5 (ref 5.0–8.0)

## 2023-04-22 LAB — COMPREHENSIVE METABOLIC PANEL
ALT: 10 U/L (ref 0–44)
AST: 22 U/L (ref 15–41)
Albumin: 4.3 g/dL (ref 3.5–5.0)
Alkaline Phosphatase: 70 U/L (ref 38–126)
Anion gap: 9 (ref 5–15)
BUN: 14 mg/dL (ref 8–23)
CO2: 28 mmol/L (ref 22–32)
Calcium: 10 mg/dL (ref 8.9–10.3)
Chloride: 100 mmol/L (ref 98–111)
Creatinine, Ser: 0.98 mg/dL (ref 0.44–1.00)
GFR, Estimated: >60 mL/min (ref >60)
Glucose, Bld: 88 mg/dL (ref 70–99)
Potassium: 4.3 mmol/L (ref 3.5–5.1)
Sodium: 137 mmol/L (ref 135–145)
Total Bilirubin: 0.8 mg/dL (ref 0.3–1.2)
Total Protein: 8.1 g/dL (ref 6.5–8.1)

## 2023-04-22 LAB — CBC
HCT: 45.2 % (ref 36.0–46.0)
Hemoglobin: 14.6 g/dL (ref 12.0–15.0)
MCH: 27.9 pg (ref 26.0–34.0)
MCHC: 32.3 g/dL (ref 30.0–36.0)
MCV: 86.4 fL (ref 80.0–100.0)
Platelets: 245 10*3/uL (ref 150–400)
RBC: 5.23 MIL/uL — ABNORMAL HIGH (ref 3.87–5.11)
RDW: 14.1 % (ref 11.5–15.5)
WBC: 6.4 10*3/uL (ref 4.0–10.5)
nRBC: 0 % (ref 0.0–0.2)

## 2023-04-22 LAB — LIPASE, BLOOD: Lipase: 10 U/L — ABNORMAL LOW (ref 11–51)

## 2023-04-22 MED ORDER — LIDOCAINE VISCOUS HCL 2 % MT SOLN
15.0000 mL | Freq: Once | OROMUCOSAL | Status: AC
  Administered 2023-04-22: 15 mL via ORAL
  Filled 2023-04-22: qty 15

## 2023-04-22 MED ORDER — POLYETHYLENE GLYCOL 3350 17 GM/SCOOP PO POWD: ORAL | 0 refills | Status: DC

## 2023-04-22 MED ORDER — ALUM & MAG HYDROXIDE-SIMETH 200-200-20 MG/5ML PO SUSP
30.0000 mL | Freq: Once | ORAL | Status: AC
  Administered 2023-04-22: 30 mL via ORAL
  Filled 2023-04-22: qty 30

## 2023-04-22 MED ORDER — ONDANSETRON HCL 4 MG/2ML IJ SOLN
4.0000 mg | Freq: Once | INTRAMUSCULAR | Status: AC
  Administered 2023-04-22: 4 mg via INTRAVENOUS
  Filled 2023-04-22: qty 2

## 2023-04-22 MED ORDER — ONDANSETRON 4 MG PO TBDP: 4.0000 mg | ORAL_TABLET | Freq: Three times a day (TID) | ORAL | 0 refills | Status: AC | PRN

## 2023-04-22 MED ORDER — SODIUM CHLORIDE 0.9 % IV BOLUS
1000.0000 mL | Freq: Once | INTRAVENOUS | Status: AC
  Administered 2023-04-22: 1000 mL via INTRAVENOUS

## 2023-04-22 NOTE — ED Notes (Signed)
Patient ambulated to restroom with steady gait to obtain urine sample.

## 2023-04-22 NOTE — ED Provider Notes (Signed)
[place] [facility]ERGENCY DEPARTMENT AT [facility] Provider Note  CSN: [id number] Arrival date & time: 04/21/23 1921  Chief Complaint(s) Abdominal Pain  HPI Carol [name] is a 67 y.o. female with a past medical history listed below including chronic pain syndrome on chronic narcotics   Abdominal Pain Pain location:  Epigastric, LUQ and L flank Pain quality: aching, cramping, dull and sharp   Pain severity:  Moderate Onset quality:  Gradual Duration:  7 days Timing:  Constant Progression:  Waxing and waning Chronicity:  New Relieved by:  Nothing Worsened by:  Eating Associated symptoms: constipation, fatigue and nausea   Associated symptoms: no chest pain, no cough, no diarrhea, no dysuria, no fever, no shortness of breath and no vomiting    Patient has had no bowel movements in the last 2 days.  Past Medical History Past Medical History:  Diagnosis Date   Anxiety    Arthritis    Balance problems    Cancer (HCC)    Chronic anxiety    Chronic depression    Chronic diarrhea    after gallbladder surgery   Chronic low back pain    Chronic seasonal allergic rhinitis    Complication of anesthesia    pt states has difficulty with being put to sleep    Dysrhythmia    Exogenous obesity    EXTERNAL HEMORRHOIDS 06/26/2007   Annotation: and internal Qualifier: Diagnosis of  By: Cheri Guppy     Falls    Family history of adverse reaction to anesthesia    pts daughter has N&V   Family history of breast cancer    Family history of prostate cancer    Fatigue    Hemorrhoids    History of blood clots    History of blood transfusion    2006   History of bronchitis    History of chicken pox    History of frequent urinary tract infections    History of gallstones    History of jaundice    History of kidney stones    History of measles as a child    History of mumps as a child    Hypertension    Hypothyroidism    Insomnia    Night sweats    Personal history of  radiation therapy    Pneumonia    hx of    Polycythemia    PONV (postoperative nausea and vomiting)    Poor sleep    chronic poor quality sleep wit snoring, sleep study in Nov 2013 showed mild central sleep apnea with hypoxia felt due to narcotics, no significant OSA or RLS   Thyroid disease    TIA (transient ischemic attack)    Tinnitus    Type II diabetes mellitus (HCC) 11/2016   Patient Active Problem List   Diagnosis Date Noted   Pulmonary embolism (HCC) 12/23/2022   Left knee DJD 11/04/2022   TIA (transient ischemic attack) 12/12/2021   Hypothyroidism 12/12/2021   Anxiety 12/12/2021   Chronic low back pain 12/12/2021   Genetic testing 02/22/2021   Family history of prostate cancer 02/11/2021   Family history of breast cancer 02/11/2021   Ductal carcinoma in situ (DCIS) of right breast 02/08/2021   Exertional dyspnea 05/10/2019   Major depressive disorder, recurrent episode, severe (HCC) 02/10/2016   Primary osteoarthritis of right knee 03/27/2015   Right knee DJD 03/27/2015   Diverticulosis of colon 06/26/2007   Home Medication(s) Prior to Admission medications   Medication Sig Start Date  End Date Taking? Authorizing Provider  ondansetron (ZOFRAN-ODT) 4 MG disintegrating tablet Take 1 tablet (4 mg total) by mouth every 8 (eight) hours as needed for up to 3 days for nausea or vomiting. 04/22/23 04/25/23 Yes Laruen Risser, Amadeo Garnet, MD  polyethylene glycol powder (MIRALAX) 17 GM/SCOOP powder Please take 6 capfuls of MiraLAX in a 32 oz bottle of Gatorade over 2-4 hour period. The following day take 3 capfuls on day 2. On day 3 start taking 1 capful 3 times a day. Slowly cut back as needed until you have normal bowel movements. 04/22/23  Yes Reymond Maynez, Amadeo Garnet, MD  albuterol (VENTOLIN HFA) 108 (90 Base) MCG/ACT inhaler SMARTSIG:1-2 Puff(s) Via Inhaler Every 4 Hours PRN 11/25/22   [provider]  baclofen (LIORESAL) 10 MG tablet Take 10 mg by mouth 3 (three) times daily.     [provider]  buPROPion (WELLBUTRIN XL) 150 MG 24 hr tablet Take 150 mg by mouth every morning. 01/13/23   [provider]  docusate sodium (COLACE) 100 MG capsule Take 1 capsule (100 mg total) by mouth 2 (two) times daily as needed for mild constipation. 11/04/22   Jene Every, MD  escitalopram (LEXAPRO) 20 MG tablet Take 20 mg by mouth daily. 11/30/21   [provider]  fluticasone-salmeterol (ADVAIR HFA) 115-21 MCG/ACT inhaler Inhale 2 puffs into the lungs 2 (two) times daily. 12/27/22   Martina Sinner, MD  levothyroxine (SYNTHROID) 100 MCG tablet Take 100 mcg by mouth daily before breakfast.    [provider]  methocarbamol (ROBAXIN) 500 MG tablet Take 1 tablet (500 mg total) by mouth every 8 (eight) hours as needed for muscle spasms. 11/04/22   Jene Every, MD  NEURONTIN 300 MG capsule Take 1 capsule at bedtime for 3 days, then 1 capsule twice a day for 3 days, then 1 capsule 3 times a day 01/17/23   [provider]  oxybutynin (DITROPAN-XL) 10 MG 24 hr tablet Take 10 mg by mouth at bedtime.    [provider]  oxyCODONE (OXY IR/ROXICODONE) 5 MG immediate release tablet Take 5 mg by mouth as needed. 12/22/22   [provider]  Prenatal Vit-Fe Fumarate-FA (PRENATAL MULTIVITAMIN) TABS tablet Take 1 tablet by mouth daily at 12 noon.    [provider]  propranolol ER (INDERAL LA) 60 MG 24 hr capsule Take 60 mg by mouth at bedtime. 10/07/22   [provider]  Pseudoephedrine-Acetaminophen (SM NON-ASPRIN SINUS PO) Take 81 mg by mouth.    [provider]  tirzepatide Greggory Keen) 5 MG/0.5ML Pen Inject 5 mg into the skin once a week. 08/02/22     tirzepatide (MOUNJARO) 7.5 MG/0.5ML Pen Inject 7.5 mg into the skin once a week. 10/05/22     topiramate (TOPAMAX) 25 MG tablet Take 1 tablet (25 mg total) by mouth 2 (two) times daily. 03/16/22   Micki Riley, MD  valsartan (DIOVAN) 40 MG tablet Take 40 mg by mouth  every morning. 11/08/22   [provider]  warfarin (COUMADIN) 5 MG tablet Take 1 tablet (5 mg total) by mouth daily. 02/28/23   Serena Croissant, MD  Allergies Methocarbamol, Acetaminophen, Celebrex [celecoxib], Povidone iodine, Tape, Codeine, and Doxycycline  Review of Systems Review of Systems  Constitutional:  Positive for fatigue. Negative for fever.  Respiratory:  Negative for cough and shortness of breath.   Cardiovascular:  Negative for chest pain.  Gastrointestinal:  Positive for abdominal pain, constipation and nausea. Negative for diarrhea and vomiting.  Genitourinary:  Negative for dysuria.   As noted in HPI  Physical Exam Vital Signs  I have reviewed the triage vital signs BP (!) 191/98 (BP Location: Right Arm)   Pulse 86   Temp 98.1 F (36.7 C) (Oral)   Resp 18   Wt 97.5 kg   SpO2 98%   BMI 34.18 kg/m   Physical Exam Vitals reviewed.  Constitutional:      General: She is not in acute distress.    Appearance: She is well-developed. She is obese. She is not diaphoretic.  HENT:     Head: Normocephalic and atraumatic.     Right Ear: External ear normal.     Left Ear: External ear normal.     Nose: Nose normal.  Eyes:     General: No scleral icterus.    Conjunctiva/sclera: Conjunctivae normal.  Neck:     Trachea: Phonation normal.  Cardiovascular:     Rate and Rhythm: Normal rate and regular rhythm.  Pulmonary:     Effort: Pulmonary effort is normal. No respiratory distress.     Breath sounds: No stridor.  Abdominal:     General: There is no distension.     Tenderness: There is abdominal tenderness in the epigastric area and left upper quadrant. There is left CVA tenderness.  Musculoskeletal:        General: Normal range of motion.     Cervical back: Normal range of motion.  Neurological:     Mental Status: She is alert  and oriented to person, place, and time.  Psychiatric:        Behavior: Behavior normal.     ED Results and Treatments Labs (all labs ordered are listed, but only abnormal results are displayed) Labs Reviewed  LIPASE, BLOOD - Abnormal; Notable for the following components:      Result Value   Lipase 10 (*)    All other components within normal limits  CBC - Abnormal; Notable for the following components:   RBC 5.23 (*)    All other components within normal limits  URINALYSIS, ROUTINE W REFLEX MICROSCOPIC - Abnormal; Notable for the following components:   Ketones, ur TRACE (*)    All other components within normal limits  COMPREHENSIVE METABOLIC PANEL                                                                                                                         EKG  EKG Interpretation Date/Time:  Friday April 21 2023 19:37:48 EDT Ventricular Rate:  109 PR Interval:  116 QRS Duration:  78 QT Interval:  316 QTC Calculation: 425 R  Axis:   47  Text Interpretation: Sinus tachycardia Nonspecific ST abnormality Abnormal ECG When compared with ECG of 06-Dec-2022 21:24, PREVIOUS ECG IS PRESENT Confirmed by Drema Pry [phone]) on 04/22/2023 2:54:54 AM       Radiology No results found.  Medications Ordered in ED Medications  alum & mag hydroxide-simeth (MAALOX/MYLANTA) 200-200-20 MG/5ML suspension 30 mL (30 mLs Oral Given 04/22/23 0048)    And  lidocaine (XYLOCAINE) 2 % viscous mouth solution 15 mL (15 mLs Oral Given 04/22/23 0048)  sodium chloride 0.9 % bolus 1,000 mL (0 mLs Intravenous Stopped 04/22/23 0305)  ondansetron (ZOFRAN) injection 4 mg (4 mg Intravenous Given 04/22/23 0050)   Procedures Procedures  (including critical care time) Medical Decision Making / ED Course   Medical Decision Making Amount and/or Complexity of Data Reviewed Labs: ordered. Decision-making details documented in ED Course. ECG/medicine tests: ordered and independent  interpretation performed. Decision-making details documented in ED Course.  Risk OTC drugs. Prescription drug management.    Postprandial abdominal pain for 1 week  Differential includes gastritis, pancreatitis, biliary disease, constipation.  Also considered SBO as well as intra-abdominal inflammatory/infectious processes.  CBC without leukocytosis or anemia.  Metabolic panel without significant electrolyte derangement or renal insufficiency.  No evidence of bili obstruction or pancreatitis. UA without evidence of infection.  GI cocktail provided little relief. Zofran improved the nausea.  Tolerating p.o.  Discussed advanced imaging to assess for SBO but given the duration of symptoms the suspicion was low.  With shared decision making, patient opted against CT scan at this time.  Feel this is reasonable.  Strict return precautions were given    Final Clinical Impression(s) / ED Diagnoses Final diagnoses:  Abdominal discomfort  Nausea   The patient appears reasonably screened and/or stabilized for discharge and I doubt any other medical condition or other [facility] requiring further screening, evaluation, or treatment in the ED at this time. I have discussed the findings, Dx and Tx plan with the patient/family who expressed understanding and agree(s) with the plan. Discharge instructions discussed at length. The patient/family was given strict return precautions who verbalized understanding of the instructions. No further questions at time of discharge.  Disposition: Discharge  Condition: Good  ED Discharge Orders          Ordered    ondansetron (ZOFRAN-ODT) 4 MG disintegrating tablet  Every 8 hours PRN        04/22/23 0300    polyethylene glycol powder (MIRALAX) 17 GM/SCOOP powder        04/22/23 0300             Follow Up: Lewis Moccasin, MD [address] [place] [address] [phone]  Call      This chart was dictated using voice recognition software.   Despite best efforts to proofread,  errors can occur which can change the documentation meaning.    Nira Conn, MD 04/22/23 [phone]

## 2023-04-24 DIAGNOSIS — Z8241 Family history of sudden cardiac death: Secondary | ICD-10-CM | POA: Diagnosis not present

## 2023-04-24 DIAGNOSIS — I10 Essential (primary) hypertension: Secondary | ICD-10-CM | POA: Diagnosis not present

## 2023-04-24 DIAGNOSIS — R109 Unspecified abdominal pain: Secondary | ICD-10-CM | POA: Diagnosis not present

## 2023-04-24 DIAGNOSIS — R9431 Abnormal electrocardiogram [ECG] [EKG]: Secondary | ICD-10-CM | POA: Diagnosis not present

## 2023-04-26 ENCOUNTER — Telehealth: Payer: Self-pay

## 2023-04-26 NOTE — Telephone Encounter (Signed)
Pt called to cancel her appt with Dr Pamelia Hoit 04/27/23 stating she is having "stomach issues". She states she would prefer not to r/s at this time and she will call when she can. Advised pt to call us if she should need anything.

## 2023-04-27 ENCOUNTER — Inpatient Hospital Stay: Payer: Medicare Other | Admitting: Hematology and Oncology

## 2023-04-27 ENCOUNTER — Inpatient Hospital Stay: Payer: Medicare Other | Attending: Hematology and Oncology

## 2023-05-15 ENCOUNTER — Encounter: Payer: Self-pay | Admitting: Internal Medicine

## 2023-05-16 DIAGNOSIS — F331 Major depressive disorder, recurrent, moderate: Secondary | ICD-10-CM | POA: Diagnosis not present

## 2023-05-16 DIAGNOSIS — G4700 Insomnia, unspecified: Secondary | ICD-10-CM | POA: Diagnosis not present

## 2023-05-16 DIAGNOSIS — F411 Generalized anxiety disorder: Secondary | ICD-10-CM | POA: Diagnosis not present

## 2023-05-17 DIAGNOSIS — M5416 Radiculopathy, lumbar region: Secondary | ICD-10-CM | POA: Diagnosis not present

## 2023-05-17 DIAGNOSIS — M5451 Vertebrogenic low back pain: Secondary | ICD-10-CM | POA: Diagnosis not present

## 2023-05-17 DIAGNOSIS — M47896 Other spondylosis, lumbar region: Secondary | ICD-10-CM | POA: Diagnosis not present

## 2023-05-17 DIAGNOSIS — M51362 Other intervertebral disc degeneration, lumbar region with discogenic back pain and lower extremity pain: Secondary | ICD-10-CM | POA: Diagnosis not present

## 2023-05-17 DIAGNOSIS — Z981 Arthrodesis status: Secondary | ICD-10-CM | POA: Diagnosis not present

## 2023-05-17 DIAGNOSIS — M961 Postlaminectomy syndrome, not elsewhere classified: Secondary | ICD-10-CM | POA: Diagnosis not present

## 2023-05-22 ENCOUNTER — Telehealth: Payer: Self-pay | Admitting: *Deleted

## 2023-05-22 NOTE — Telephone Encounter (Signed)
Called patient and confirmed she is NO longer taking Coumadin, has been off if about a month.

## 2023-05-23 DIAGNOSIS — M25562 Pain in left knee: Secondary | ICD-10-CM | POA: Diagnosis not present

## 2023-05-23 DIAGNOSIS — Z471 Aftercare following joint replacement surgery: Secondary | ICD-10-CM | POA: Diagnosis not present

## 2023-05-23 DIAGNOSIS — Z96652 Presence of left artificial knee joint: Secondary | ICD-10-CM | POA: Diagnosis not present

## 2023-05-25 DIAGNOSIS — F411 Generalized anxiety disorder: Secondary | ICD-10-CM | POA: Diagnosis not present

## 2023-05-25 DIAGNOSIS — G8929 Other chronic pain: Secondary | ICD-10-CM | POA: Diagnosis not present

## 2023-05-25 DIAGNOSIS — N3000 Acute cystitis without hematuria: Secondary | ICD-10-CM | POA: Diagnosis not present

## 2023-05-25 DIAGNOSIS — M792 Neuralgia and neuritis, unspecified: Secondary | ICD-10-CM | POA: Diagnosis not present

## 2023-05-31 ENCOUNTER — Ambulatory Visit (AMBULATORY_SURGERY_CENTER): Payer: Medicare Other

## 2023-05-31 VITALS — Ht 66.5 in | Wt 200.0 lb

## 2023-05-31 DIAGNOSIS — Z1211 Encounter for screening for malignant neoplasm of colon: Secondary | ICD-10-CM

## 2023-05-31 MED ORDER — NA SULFATE-K SULFATE-MG SULF 17.5-3.13-1.6 GM/177ML PO SOLN
1.0000 | Freq: Once | ORAL | 0 refills | Status: AC
Start: 2023-05-31 — End: 2023-05-31

## 2023-05-31 NOTE — Progress Notes (Signed)
No egg or soy allergy known to patient  No issues known to pt with past sedation with any surgeries or procedures Patient denies ever being told they had issues or difficulty with intubation  No FH of Malignant Hyperthermia Pt is not on diet pills Pt is not on  home 02  Pt is not on blood thinners  Pt denies issues with constipation  No A fib or A flutter Have any cardiac testing pending- January visit Pt can ambulate indepenedently Pt denies use of chewing tobacco Discussed diabetic I weight loss medication holds Discussed NSAID holds Checked BMI Pt instructed to use [employer] or GoodRx for a price reduction on prep  Patient's chart reviewed by Cathlyn Parsons CNRA prior to previsit and patient appropriate for the [facility].  Pre visit completed and red dot placed by patient's name on their procedure day (on provider's schedule).

## 2023-06-07 DIAGNOSIS — G8929 Other chronic pain: Secondary | ICD-10-CM | POA: Diagnosis not present

## 2023-06-07 DIAGNOSIS — M792 Neuralgia and neuritis, unspecified: Secondary | ICD-10-CM | POA: Diagnosis not present

## 2023-06-07 DIAGNOSIS — G4700 Insomnia, unspecified: Secondary | ICD-10-CM | POA: Diagnosis not present

## 2023-06-07 DIAGNOSIS — F411 Generalized anxiety disorder: Secondary | ICD-10-CM | POA: Diagnosis not present

## 2023-06-07 DIAGNOSIS — F331 Major depressive disorder, recurrent, moderate: Secondary | ICD-10-CM | POA: Diagnosis not present

## 2023-06-09 ENCOUNTER — Encounter: Payer: Self-pay | Admitting: Internal Medicine

## 2023-06-09 ENCOUNTER — Ambulatory Visit: Payer: Medicare Other | Admitting: Internal Medicine

## 2023-06-09 VITALS — BP 113/65 | HR 60 | Temp 97.3°F | Resp 18 | Ht 66.5 in | Wt 200.0 lb

## 2023-06-09 DIAGNOSIS — Z1211 Encounter for screening for malignant neoplasm of colon: Secondary | ICD-10-CM | POA: Diagnosis not present

## 2023-06-09 DIAGNOSIS — D123 Benign neoplasm of transverse colon: Secondary | ICD-10-CM | POA: Diagnosis not present

## 2023-06-09 DIAGNOSIS — E039 Hypothyroidism, unspecified: Secondary | ICD-10-CM | POA: Diagnosis not present

## 2023-06-09 DIAGNOSIS — F32A Depression, unspecified: Secondary | ICD-10-CM | POA: Diagnosis not present

## 2023-06-09 DIAGNOSIS — F419 Anxiety disorder, unspecified: Secondary | ICD-10-CM | POA: Diagnosis not present

## 2023-06-09 DIAGNOSIS — K635 Polyp of colon: Secondary | ICD-10-CM | POA: Diagnosis not present

## 2023-06-09 DIAGNOSIS — I10 Essential (primary) hypertension: Secondary | ICD-10-CM | POA: Diagnosis not present

## 2023-06-09 MED ORDER — SODIUM CHLORIDE 0.9 % IV SOLN
500.0000 mL | Freq: Once | INTRAVENOUS | Status: DC
Start: 2023-06-09 — End: 2023-06-09

## 2023-06-09 MED ORDER — NA SULFATE-K SULFATE-MG SULF 17.5-3.13-1.6 GM/177ML PO SOLN: 1.0000 | Freq: Once | ORAL | 0 refills | Status: AC

## 2023-06-09 NOTE — Progress Notes (Signed)
Pt's states no medical or surgical changes since previsit or office visit. 

## 2023-06-09 NOTE — Addendum Note (Signed)
Addended by: Waynard Edwards T on: 06/09/2023 10:14 AM   Modules accepted: Orders

## 2023-06-09 NOTE — Op Note (Signed)
[facility] Patient Name: Carol [name] Procedure Date: 06/09/2023 8:12 AM MRN: [id number] Endoscopist: Particia Lather , , [id number] Age: 67 Referring MD:  Date of Birth: 1956-04-11 Gender: Female Account #: [id number] Procedure:                Colonoscopy Indications:              Screening for colorectal malignant neoplasm Medicines:                Monitored Anesthesia Care Procedure:                Pre-Anesthesia Assessment:                           - Prior to the procedure, a History and Physical                            was performed, and patient medications and                            allergies were reviewed. The patient's tolerance of                            previous anesthesia was also reviewed. The risks                            and benefits of the procedure and the sedation                            options and risks were discussed with the patient.                            All questions were answered, and informed consent                            was obtained. Prior Anticoagulants: The patient has                            taken no anticoagulant or antiplatelet agents. ASA                            Grade Assessment: II - A patient with mild systemic                            disease. After reviewing the risks and benefits,                            the patient was deemed in satisfactory condition to                            undergo the procedure.                           After obtaining informed consent, the colonoscope  was passed under direct vision. Throughout the                            procedure, the patient's blood pressure, pulse, and                            oxygen saturations were monitored continuously. The                            Olympus Scope SN [id number] was introduced through the                            anus and advanced to the the terminal ileum. The                             colonoscopy was performed without difficulty. The                            patient tolerated the procedure well. The quality                            of the bowel preparation was inadequate. The                            terminal ileum, ileocecal valve, appendiceal                            orifice, and rectum were photographed. Scope In: 8:21:39 AM Scope Out: 8:42:02 AM Scope Withdrawal Time: 0 hours 15 minutes 53 seconds  Total Procedure Duration: 0 hours 20 minutes 23 seconds  Findings:                 The terminal ileum appeared normal.                           A moderate amount of stool was found in the                            transverse colon, in the ascending colon and in the                            cecum, interfering with visualization.                           A 5 mm polyp was found in the transverse colon. The                            polyp was sessile. The polyp was removed with a                            cold snare. Resection and retrieval were complete.                           Non-bleeding internal hemorrhoids were  found during                            retroflexion. Complications:            No immediate complications. Estimated Blood Loss:     Estimated blood loss was minimal. Impression:               - Preparation of the colon was inadequate.                           - The examined portion of the ileum was normal.                           - Stool in the transverse colon, in the ascending                            colon and in the cecum.                           - One 5 mm polyp in the transverse colon, removed                            with a cold snare. Resected and retrieved.                           - Non-bleeding internal hemorrhoids. Recommendation:           - Discharge patient to home (with escort).                           - Await pathology results.                           - Repeat colonoscopy at the next available                             appointment with a two day prep because the bowel                            preparation was poor.                           - The findings and recommendations were discussed                            with the patient. Dr Particia Lather "Alan Ripper" Leonides Schanz,  06/09/2023 8:55:50 AM

## 2023-06-09 NOTE — Progress Notes (Signed)
GASTROENTEROLOGY PROCEDURE H&P NOTE   Primary Care Physician: Lewis Moccasin, MD    Reason for Procedure:   Colon cancer screening  Plan:    Colonoscopy   Patient is appropriate for endoscopic procedure(s) in the ambulatory ([facility]) setting.  The nature of the procedure, as well as the risks, benefits, and alternatives were carefully and thoroughly reviewed with the patient. Ample time for discussion and questions allowed. The patient understood, was satisfied, and agreed to proceed.     HPI: Carol [name] is a 67 y.o. female who presents for colonoscopy for colon cancer screening. Her last colonoscopy was in 2004 that was normal. Denies blood in the stools or unintentional weight loss. She did recently have some ab pain and she has been having more BMs than usual.   Past Medical History:  Diagnosis Date   Anxiety    Arthritis    Balance problems    Cancer (HCC)    Chronic anxiety    Chronic depression    Chronic diarrhea    after gallbladder surgery   Chronic low back pain    Chronic seasonal allergic rhinitis    Complication of anesthesia    pt states has difficulty with being put to sleep    Dysrhythmia    Exogenous obesity    EXTERNAL HEMORRHOIDS 06/26/2007   Annotation: and internal Qualifier: Diagnosis of  By: Cheri Guppy     Falls    Family history of adverse reaction to anesthesia    pts daughter has N&V   Family history of breast cancer    Family history of prostate cancer    Fatigue    Hemorrhoids    History of blood clots    History of blood transfusion    2006   History of bronchitis    History of chicken pox    History of frequent urinary tract infections    History of gallstones    History of jaundice    History of kidney stones    History of measles as a child    History of mumps as a child    Hypertension    Hypothyroidism    Insomnia    Night sweats    Personal history of radiation therapy    Pneumonia    hx of    Polycythemia     PONV (postoperative nausea and vomiting)    Poor sleep    chronic poor quality sleep wit snoring, sleep study in Nov 2013 showed mild central sleep apnea with hypoxia felt due to narcotics, no significant OSA or RLS   Stroke [facility])    Thyroid disease    TIA (transient ischemic attack)    Tinnitus    Type II diabetes mellitus (HCC) 11/2016    Past Surgical History:  Procedure Laterality Date   ABDOMINAL HYSTERECTOMY     APPENDECTOMY  1972   BACK SURGERY     BREAST BIOPSY Right    years ago- unsure when   BREAST LUMPECTOMY     BREAST LUMPECTOMY WITH RADIOACTIVE SEED LOCALIZATION Right 03/25/2021   Procedure: RIGHT BREAST LUMPECTOMY WITH RADIOACTIVE SEED LOCALIZATION X2;  Surgeon: Almond Lint, MD;  Location: [facility] OR;  Service: General;  Laterality: Right;   CHOLECYSTECTOMY  1987   COLONOSCOPY  11/2011   showed mild diverticulosis, internal hemorrhoids, and no polyps   ENDOSCOPY with esophageal dilatation  03/14/2016   KNEE ARTHROSCOPY Right 01/07/2016   Procedure: ARTHROSCOPY RIGHT KNEE, DEBRIDEMENT OF ARTHROFIBROSIS, AND EXAM UNDER  ANESTHESIA;  Surgeon: Jene Every, MD;  Location: [facility];  Service: Orthopedics;  Laterality: Right;   KNEE ARTHROSCOPY Right 10/04/2017   Procedure: Right knee arthroscopy, evaluation under anesthesia, lysis of adhesions;  Surgeon: Jene Every, MD;  Location: [facility];  Service: Orthopedics;  Laterality: Right;  60 mins   KNEE SURGERY     L3/4  fusion  2004   L3/4 discectomy  2003   L4/5 discectomy  2001   L4/5 fusion  2002   left foot surgery      to repair break - 2002   left knee meniscectomy  2012   lumbar spine ESI  12/2017   menicus tear     bilat;    RE-EXCISION OF BREAST LUMPECTOMY Right 04/29/2021   Procedure: RE-EXCISION OF RIGHT BREAST LUMPECTOMY;  Surgeon: Almond Lint, MD;  Location: [facility] OR;  Service: General;  Laterality: Right;   right knee arthroscopy  12/2015   Dr. Shelle Iron   right knee meniscectomy  2012   TONSILLECTOMY      TOTAL KNEE ARTHROPLASTY Right 03/27/2015   Procedure: RIGHT TOTAL KNEE ARTHROPLASTY;  Surgeon: Jene Every, MD;  Location: [facility];  Service: Orthopedics;  Laterality: Right;   TOTAL KNEE ARTHROPLASTY Left 11/04/2022   Procedure: TOTAL KNEE ARTHROPLASTY;  Surgeon: Jene Every, MD;  Location: [facility];  Service: Orthopedics;  Laterality: Left;   total knee replacement Right 03/2015   VAGINAL HYSTERECTOMY  1996   with ovaries intact, accidental bladder laceration   WRIST SURGERY Right 11/2016   Dr. Amanda Pea    Prior to Admission medications   Medication Sig Start Date End Date Taking? Authorizing Provider  ALPRAZolam Prudy Feeler) 0.25 MG tablet Take by mouth. 06/07/23  Yes [provider]  levothyroxine (SYNTHROID) 100 MCG tablet Take 100 mcg by mouth daily before breakfast.   Yes [provider]  PAXIL 20 MG tablet Take 1 tablet every day by oral route.   Yes [provider]  diclofenac (VOLTAREN) 75 MG EC tablet Take 75 mg by mouth 2 (two) times daily as needed. 05/25/23   [provider]  ondansetron (ZOFRAN) 8 MG tablet 1 tablet as needed Orally Once a day for 30 day(s)    [provider]  tirzepatide (MOUNJARO) 7.5 MG/0.5ML Pen Inject 7.5 mg into the skin once a week. 10/05/22       Current Outpatient Medications  Medication Sig Dispense Refill   ALPRAZolam (XANAX) 0.25 MG tablet Take by mouth.     levothyroxine (SYNTHROID) 100 MCG tablet Take 100 mcg by mouth daily before breakfast.     PAXIL 20 MG tablet Take 1 tablet every day by oral route.     diclofenac (VOLTAREN) 75 MG EC tablet Take 75 mg by mouth 2 (two) times daily as needed.     ondansetron (ZOFRAN) 8 MG tablet 1 tablet as needed Orally Once a day for 30 day(s)     tirzepatide (MOUNJARO) 7.5 MG/0.5ML Pen Inject 7.5 mg into the skin once a week. 2 mL 6   Current Facility-Administered Medications  Medication Dose Route Frequency Provider Last Rate Last Admin   0.9 %  sodium chloride  infusion  500 mL Intravenous Once Imogene Burn, MD        Allergies as of 06/09/2023 - Review Complete 06/09/2023  Allergen Reaction Noted   Methocarbamol Other (See Comments) and Nausea Only 01/25/2023   Acetaminophen Nausea And Vomiting 03/12/2015   Celebrex [celecoxib] Itching 02/22/2012   Povidone iodine Hives 03/12/2015  Povidone-iodine Hives 05/31/2023   Tape  03/10/2021   Codeine Rash    Doxycycline Rash 10/11/2014    Family History  Problem Relation Age of Onset   Hypertension Mother    COPD Mother    Diabetes Mother    Heart attack Father 33   Prostate cancer Maternal Uncle 49   Myasthenia gravis Daughter    Depression Daughter    Other Son        esophageal dysmotility, [employer]   Diabetes type II Other    Other Other        premature cardiovascular disease   Breast cancer Other 60       bilateral, mother's first cousin   Prostate cancer Other        dx 28s, maternal cousin's son   Colon polyps Neg Hx    Crohn's disease Neg Hx    Esophageal cancer Neg Hx    Rectal cancer Neg Hx    Stomach cancer Neg Hx     Social History   Socioeconomic History   Marital status: Married    Spouse name: Not on file   Number of children: Not on file   Years of education: Not on file   Highest education level: Not on file  Occupational History   Not on file  Tobacco Use   Smoking status: Never   Smokeless tobacco: Never  Vaping Use   Vaping status: Never Used  Substance and Sexual Activity   Alcohol use: No   Drug use: No   Sexual activity: Yes    Birth control/protection: None, Surgical  Other Topics Concern   Not on file  Social History Narrative   Not on file   Social Determinants of Health   Financial Resource Strain: Low Risk  (05/20/2021)   Overall Financial Resource Strain (CARDIA)    Difficulty of Paying Living Expenses: Not very hard  Food Insecurity: Patient Declined (11/04/2022)   Hunger Vital Sign    Worried About Running Out of Food  in the Last Year: Patient declined    Ran Out of Food in the Last Year: Patient declined  Transportation Needs: No Transportation Needs (11/04/2022)   PRAPARE - Administrator, Civil Service (Medical): No    Lack of Transportation (Non-Medical): No  Physical Activity: Not on file  Stress: Stress Concern Present (05/20/2021)   [employer] of Occupational Health - Occupational Stress Questionnaire    Feeling of Stress : Very much  Social Connections: Moderately Integrated (05/20/2021)   Social Connection and Isolation Panel [NHANES]    Frequency of Communication with Friends and Family: More than three times a week    Frequency of Social Gatherings with Friends and Family: More than three times a week    Attends Religious Services: 1 to 4 times per year    Active Member of [employer] or Organizations: No    Attends Banker Meetings: Never    Marital Status: Married  Catering manager Violence: Not At Risk (11/04/2022)   Humiliation, Afraid, Rape, and Kick questionnaire    Fear of Current or Ex-Partner: No    Emotionally Abused: No    Physically Abused: No    Sexually Abused: No    Physical Exam: Vital signs in last 24 hours: BP 101/63   Pulse 60   Temp (!) 97.3 F (36.3 C)   Resp 14   Ht 5' 6.5" (1.689 m)   Wt 200 lb (90.7 kg)   SpO2 94%  BMI 31.80 kg/m  GEN: NAD EYE: Sclerae anicteric ENT: MMM CV: Non-tachycardic Pulm: No increased work of breathing GI: Soft, NT/ND NEURO:  Alert & Oriented   Eulah Pont, MD [facility]  06/09/2023 8:48 AM

## 2023-06-09 NOTE — Patient Instructions (Signed)
-   Discharge patient to home (with escort). - Await pathology results. - Repeat colonoscopy at the next available appointment with a two day prep because the bowel preparation was poor. - The findings and recommendations were discussed with the patient.    YOU HAD AN ENDOSCOPIC PROCEDURE TODAY AT THE [facility]:   Refer to the procedure report that was given to you for any specific questions about what was found during the examination.  If the procedure report does not answer your questions, please call your gastroenterologist to clarify.  If you requested that your care partner not be given the details of your procedure findings, then the procedure report has been included in a sealed envelope for you to review at your convenience later.  YOU SHOULD EXPECT: Some feelings of bloating in the abdomen. Passage of more gas than usual.  Walking can help get rid of the air that was put into your GI tract during the procedure and reduce the bloating. If you had a lower endoscopy (such as a colonoscopy or flexible sigmoidoscopy) you may notice spotting of blood in your stool or on the toilet paper. If you underwent a bowel prep for your procedure, you may not have a normal bowel movement for a few days.  Please Note:  You might notice some irritation and congestion in your nose or some drainage.  This is from the oxygen used during your procedure.  There is no need for concern and it should clear up in a day or so.  SYMPTOMS TO REPORT IMMEDIATELY:  Following lower endoscopy (colonoscopy or flexible sigmoidoscopy):  Excessive amounts of blood in the stool  Significant tenderness or worsening of abdominal pains  Swelling of the abdomen that is new, acute  Fever of 100F or higher  For urgent or emergent issues, a gastroenterologist can be reached at any hour by calling [phone]. Do not use MyChart messaging for urgent concerns.    DIET:  We do recommend a small meal at first, but  then you may proceed to your regular diet.  Drink plenty of fluids but you should avoid alcoholic beverages for 24 hours.  ACTIVITY:  You should plan to take it easy for the rest of today and you should NOT DRIVE or use heavy machinery until tomorrow (because of the sedation medicines used during the test).    FOLLOW UP: Our staff will call the number listed on your records the next business day following your procedure.  We will call around 7:15- 8:00 am to check on you and address any questions or concerns that you may have regarding the information given to you following your procedure. If we do not reach you, we will leave a message.     If any biopsies were taken you will be contacted by phone or by letter within the next 1-3 weeks.  Please call us at [phone] if you have not heard about the biopsies in 3 weeks.    SIGNATURES/CONFIDENTIALITY: You and/or your care partner have signed paperwork which will be entered into your electronic medical record.  These signatures attest to the fact that that the information above on your After Visit Summary has been reviewed and is understood.  Full responsibility of the confidentiality of this discharge information lies with you and/or your care-partner.

## 2023-06-09 NOTE — Progress Notes (Signed)
Sedate, gd SR, tolerated procedure well, VSS, report to RN 

## 2023-06-12 ENCOUNTER — Telehealth: Payer: Self-pay

## 2023-06-12 DIAGNOSIS — N3000 Acute cystitis without hematuria: Secondary | ICD-10-CM | POA: Diagnosis not present

## 2023-06-12 NOTE — Telephone Encounter (Signed)
  Follow up Call-     06/09/2023    7:12 AM  Call back number  Post procedure Call Back phone  # [phone]  Permission to leave phone message Yes     Patient questions:  Do you have a fever, pain , or abdominal swelling? No. Pain Score  0 *  Have you tolerated food without any problems? Yes.    Have you been able to return to your normal activities? Yes.    Do you have any questions about your discharge instructions: Diet   No. Medications  No. Follow up visit  No.  Do you have questions or concerns about your Care? No.  Actions: * If pain score is 4 or above: No action needed, pain <4.

## 2023-06-13 ENCOUNTER — Encounter: Payer: Medicare Other | Admitting: Internal Medicine

## 2023-06-13 ENCOUNTER — Encounter: Payer: Self-pay | Admitting: Internal Medicine

## 2023-06-13 LAB — SURGICAL PATHOLOGY

## 2023-06-16 DIAGNOSIS — Z1331 Encounter for screening for depression: Secondary | ICD-10-CM | POA: Diagnosis not present

## 2023-06-16 DIAGNOSIS — Z1339 Encounter for screening examination for other mental health and behavioral disorders: Secondary | ICD-10-CM | POA: Diagnosis not present

## 2023-06-16 DIAGNOSIS — N390 Urinary tract infection, site not specified: Secondary | ICD-10-CM | POA: Diagnosis not present

## 2023-06-16 DIAGNOSIS — Z0000 Encounter for general adult medical examination without abnormal findings: Secondary | ICD-10-CM | POA: Diagnosis not present

## 2023-06-26 DIAGNOSIS — B9689 Other specified bacterial agents as the cause of diseases classified elsewhere: Secondary | ICD-10-CM | POA: Diagnosis not present

## 2023-06-26 DIAGNOSIS — J329 Chronic sinusitis, unspecified: Secondary | ICD-10-CM | POA: Diagnosis not present

## 2023-06-26 DIAGNOSIS — J069 Acute upper respiratory infection, unspecified: Secondary | ICD-10-CM | POA: Diagnosis not present

## 2023-07-17 ENCOUNTER — Ambulatory Visit: Payer: Medicare Other | Admitting: Pulmonary Disease

## 2023-07-17 NOTE — Progress Notes (Deleted)
Synopsis: Referred in June 2024 for pulmonary embolism by Maryelizabeth Rowan, MD  Subjective:   PATIENT ID: Carol [name] GENDER: female DOB: 1956-04-30, MRN: [id number]   HPI  No chief complaint on file.  Carol [name] is a 67 year old woman, never smoker with history of right breast cancer s/p lumpectomy 2022, DMII and hypertension who is referred to pulmonary clinic for pulmonary embolism.   She underwent left total knee arthroplasty on 11/04/22. She developed shortness of breath 1 week after and had CTA PE study on 5/4 which showed pulmonary emboli involving the subsegmental branches in the right lower lobe and right upper lobe. There are patchy ground glass densities in both lungs, some areas are similar to scan in 2012. She was started on eliquis. She presented to the ER 5/14 for hemoptysis which included coughing up 2 bloody mucous plugs. Chest x-ray was normal. No elevated BNP.   No history of blood clots. She continues to have exertional dyspnea. No further episodes of blood clots.   Her mother had COPD and died from pneumonia. She had second hand smoke from her mother. She is helping with her son and grandkids but has trouble keeping up due to the dyspnea.  Past Medical History:  Diagnosis Date   Anxiety    Arthritis    Balance problems    Cancer (HCC)    Chronic anxiety    Chronic depression    Chronic diarrhea    after gallbladder surgery   Chronic low back pain    Chronic seasonal allergic rhinitis    Complication of anesthesia    pt states has difficulty with being put to sleep    Dysrhythmia    Exogenous obesity    EXTERNAL HEMORRHOIDS 06/26/2007   Annotation: and internal Qualifier: Diagnosis of  By: Cheri Guppy     Falls    Family history of adverse reaction to anesthesia    pts daughter has N&V   Family history of breast cancer    Family history of prostate cancer    Fatigue    Hemorrhoids    History of blood clots    History of blood transfusion    2006    History of bronchitis    History of chicken pox    History of frequent urinary tract infections    History of gallstones    History of jaundice    History of kidney stones    History of measles as a child    History of mumps as a child    Hypertension    Hypothyroidism    Insomnia    Night sweats    Personal history of radiation therapy    Pneumonia    hx of    Polycythemia    PONV (postoperative nausea and vomiting)    Poor sleep    chronic poor quality sleep wit snoring, sleep study in Nov 2013 showed mild central sleep apnea with hypoxia felt due to narcotics, no significant OSA or RLS   Stroke [facility])    Thyroid disease    TIA (transient ischemic attack)    Tinnitus    Type II diabetes mellitus (HCC) 11/2016     Family History  Problem Relation Age of Onset   Hypertension Mother    COPD Mother    Diabetes Mother    Heart attack Father 60   Prostate cancer Maternal Uncle 48   Myasthenia gravis Daughter    Depression Daughter    Other Son  esophageal dysmotility, [employer]   Diabetes type II Other    Other Other        premature cardiovascular disease   Breast cancer Other 60       bilateral, mother's first cousin   Prostate cancer Other        dx 24s, maternal cousin's son   Colon polyps Neg Hx    Crohn's disease Neg Hx    Esophageal cancer Neg Hx    Rectal cancer Neg Hx    Stomach cancer Neg Hx      Social History   Socioeconomic History   Marital status: Married    Spouse name: Not on file   Number of children: Not on file   Years of education: Not on file   Highest education level: Not on file  Occupational History   Not on file  Tobacco Use   Smoking status: Never   Smokeless tobacco: Never  Vaping Use   Vaping status: Never Used  Substance and Sexual Activity   Alcohol use: No   Drug use: No   Sexual activity: Yes    Birth control/protection: None, Surgical  Other Topics Concern   Not on file  Social History Narrative   Not  on file   Social Drivers of Health   Financial Resource Strain: Low Risk  (05/20/2021)   Overall Financial Resource Strain (CARDIA)    Difficulty of Paying Living Expenses: Not very hard  Food Insecurity: Patient Declined (11/04/2022)   Hunger Vital Sign    Worried About Running Out of Food in the Last Year: Patient declined    Ran Out of Food in the Last Year: Patient declined  Transportation Needs: No Transportation Needs (11/04/2022)   PRAPARE - Administrator, Civil Service (Medical): No    Lack of Transportation (Non-Medical): No  Physical Activity: Not on file  Stress: Stress Concern Present (05/20/2021)   [employer] of Occupational Health - Occupational Stress Questionnaire    Feeling of Stress : Very much  Social Connections: Moderately Integrated (05/20/2021)   Social Connection and Isolation Panel [NHANES]    Frequency of Communication with Friends and Family: More than three times a week    Frequency of Social Gatherings with Friends and Family: More than three times a week    Attends Religious Services: 1 to 4 times per year    Active Member of [employer] or Organizations: No    Attends Banker Meetings: Never    Marital Status: Married  Catering manager Violence: Not At Risk (11/04/2022)   Humiliation, Afraid, Rape, and Kick questionnaire    Fear of Current or Ex-Partner: No    Emotionally Abused: No    Physically Abused: No    Sexually Abused: No     Allergies  Allergen Reactions   Methocarbamol Other (See Comments) and Nausea Only    Other Reaction(s): edema, encephalitis   Acetaminophen Nausea And Vomiting   Celebrex [Celecoxib] Itching   Povidone Iodine Hives    Topical    Povidone-Iodine Hives   Tape     Blisters    Codeine Rash   Doxycycline Rash     Outpatient Medications Prior to Visit  Medication Sig Dispense Refill   ALPRAZolam (XANAX) 0.25 MG tablet Take by mouth.     diclofenac (VOLTAREN) 75 MG EC tablet Take 75 mg  by mouth 2 (two) times daily as needed.     levothyroxine (SYNTHROID) 100 MCG tablet Take 100 mcg by  mouth daily before breakfast.     ondansetron (ZOFRAN) 8 MG tablet 1 tablet as needed Orally Once a day for 30 day(s)     PAXIL 20 MG tablet Take 1 tablet every day by oral route.     tirzepatide (MOUNJARO) 7.5 MG/0.5ML Pen Inject 7.5 mg into the skin once a week. 2 mL 6   No facility-administered medications prior to visit.   Review of Systems  Constitutional:  Negative for chills, fever, malaise/fatigue and weight loss.  HENT:  Positive for congestion. Negative for sinus pain and sore throat.   Eyes: Negative.   Respiratory:  Positive for cough, hemoptysis, shortness of breath and wheezing. Negative for sputum production.   Cardiovascular:  Positive for palpitations. Negative for chest pain, orthopnea, claudication and leg swelling.  Gastrointestinal:  Negative for abdominal pain, heartburn, nausea and vomiting.  Genitourinary: Negative.   Musculoskeletal:  Positive for joint pain. Negative for myalgias.  Skin:  Negative for rash.  Neurological:  Negative for weakness.  Endo/Heme/Allergies: Negative.   Psychiatric/Behavioral:  Positive for depression. The patient is nervous/anxious.    Objective:   There were no vitals filed for this visit.  Physical Exam Constitutional:      General: She is not in acute distress.    Appearance: She is not ill-appearing.  HENT:     Head: Normocephalic and atraumatic.  Eyes:     General: No scleral icterus.    Conjunctiva/sclera: Conjunctivae normal.     Pupils: Pupils are equal, round, and reactive to light.  Cardiovascular:     Rate and Rhythm: Normal rate and regular rhythm.     Pulses: Normal pulses.     Heart sounds: Normal heart sounds. No murmur heard. Pulmonary:     Effort: Pulmonary effort is normal.     Breath sounds: Normal breath sounds. No wheezing, rhonchi or rales.  Abdominal:     General: Bowel sounds are normal.      Palpations: Abdomen is soft.  Musculoskeletal:     Right lower leg: No edema.     Left lower leg: No edema.  Lymphadenopathy:     Cervical: No cervical adenopathy.  Skin:    General: Skin is warm and dry.  Neurological:     General: No focal deficit present.     Mental Status: She is alert.  Psychiatric:        Mood and Affect: Mood normal.        Behavior: Behavior normal.        Thought Content: Thought content normal.        Judgment: Judgment normal.    CBC    Component Value Date/Time   WBC 6.4 04/21/2023 2359   RBC 5.23 (H) 04/21/2023 2359   HGB 14.6 04/21/2023 2359   HGB 12.9 03/07/2023 1258   HCT 45.2 04/21/2023 2359   PLT 245 04/21/2023 2359   PLT 303 03/07/2023 1258   MCV 86.4 04/21/2023 2359   MCH 27.9 04/21/2023 2359   MCHC 32.3 04/21/2023 2359   RDW 14.1 04/21/2023 2359   LYMPHSABS 1.0 03/07/2023 1258   MONOABS 0.3 03/07/2023 1258   EOSABS 0.1 03/07/2023 1258   BASOSABS 0.0 03/07/2023 1258      Latest Ref Rng & Units 04/21/2023   11:59 PM 03/07/2023   12:58 PM 01/25/2023   10:44 AM  BMP  Glucose 70 - 99 mg/dL 88  161  096   BUN 8 - 23 mg/dL 14  11  9  Creatinine 0.44 - 1.00 mg/dL 1.61  0.96  0.45   Sodium 135 - 145 mmol/L 137  140  140   Potassium 3.5 - 5.1 mmol/L 4.3  4.0  3.8   Chloride 98 - 111 mmol/L 100  104  104   CO2 22 - 32 mmol/L 28  28  30    Calcium 8.9 - 10.3 mg/dL 40.9  9.0  9.2    Chest imaging: CTA Chest 11/26/22 Mediastinum/Nodes: Small mediastinal lymph nodes are minimally changed since 2012. No significant lymph node enlargement in the chest. Surgical clips in the right breast. No significant axillary lymph node enlargement.   Lungs/Pleura: Trachea and mainstem bronchi are patent. Patchy ground-glass densities in both lungs. Some of these findings are similar to the exam in 2012 and could represent areas of air trapping or atelectasis. Difficult to exclude mild edema. No large areas of airspace disease or consolidation. No  pleural effusions.  PFT:     No data to display          Labs:  Path:  Echo:  Heart Catheterization:     Assessment & Plan:   No diagnosis found.  Discussion: Gentrie Jindal is a 67 year old woman, never smoker with history of right breast cancer s/p lumpectomy 2022, DMII and hypertension who is referred to pulmonary clinic for pulmonary embolism.  She developed provoked pulmonary embolism after left total knee arthroplasty. She is to continue eliquis therapy for 3- 6 months in total. She has been referred to hematology for further evaluation.   Her ground glass infiltrates on CT imaging is most likely related to atelectasis as this was present on prior scan in 2012. Other differential could include vasculitis or small airways disease.  Check ANCA screen.   Try advair inhaler 2 puffs twice daily for possible reactive airways given her history of second hand smoke.  Follow up in 2-3 months for pulmonary function tests.  Melody Comas, MD [facility] Office: [phone]   Current Outpatient Medications:    ALPRAZolam (XANAX) 0.25 MG tablet, Take by mouth., Disp: , Rfl:    diclofenac (VOLTAREN) 75 MG EC tablet, Take 75 mg by mouth 2 (two) times daily as needed., Disp: , Rfl:    levothyroxine (SYNTHROID) 100 MCG tablet, Take 100 mcg by mouth daily before breakfast., Disp: , Rfl:    ondansetron (ZOFRAN) 8 MG tablet, 1 tablet as needed Orally Once a day for 30 day(s), Disp: , Rfl:    PAXIL 20 MG tablet, Take 1 tablet every day by oral route., Disp: , Rfl:    tirzepatide (MOUNJARO) 7.5 MG/0.5ML Pen, Inject 7.5 mg into the skin once a week., Disp: 2 mL, Rfl: 6

## 2023-07-20 DIAGNOSIS — Z87440 Personal history of urinary (tract) infections: Secondary | ICD-10-CM | POA: Diagnosis not present

## 2023-07-20 DIAGNOSIS — G4700 Insomnia, unspecified: Secondary | ICD-10-CM | POA: Diagnosis not present

## 2023-07-20 DIAGNOSIS — F411 Generalized anxiety disorder: Secondary | ICD-10-CM | POA: Diagnosis not present

## 2023-07-20 DIAGNOSIS — F331 Major depressive disorder, recurrent, moderate: Secondary | ICD-10-CM | POA: Diagnosis not present

## 2023-07-24 DIAGNOSIS — Z114 Encounter for screening for human immunodeficiency virus [HIV]: Secondary | ICD-10-CM | POA: Diagnosis not present

## 2023-07-24 DIAGNOSIS — N390 Urinary tract infection, site not specified: Secondary | ICD-10-CM | POA: Diagnosis not present

## 2023-07-24 DIAGNOSIS — Z0000 Encounter for general adult medical examination without abnormal findings: Secondary | ICD-10-CM | POA: Diagnosis not present

## 2023-07-24 DIAGNOSIS — R739 Hyperglycemia, unspecified: Secondary | ICD-10-CM | POA: Diagnosis not present

## 2023-07-24 DIAGNOSIS — Z13220 Encounter for screening for lipoid disorders: Secondary | ICD-10-CM | POA: Diagnosis not present

## 2023-07-25 ENCOUNTER — Encounter: Payer: Medicare Other | Admitting: Internal Medicine

## 2023-07-31 ENCOUNTER — Ambulatory Visit: Payer: Medicare Other | Admitting: Physician Assistant

## 2023-08-01 DIAGNOSIS — E039 Hypothyroidism, unspecified: Secondary | ICD-10-CM | POA: Diagnosis not present

## 2023-08-01 DIAGNOSIS — N3000 Acute cystitis without hematuria: Secondary | ICD-10-CM | POA: Diagnosis not present

## 2023-08-01 DIAGNOSIS — R7989 Other specified abnormal findings of blood chemistry: Secondary | ICD-10-CM | POA: Diagnosis not present

## 2023-08-01 DIAGNOSIS — Z87898 Personal history of other specified conditions: Secondary | ICD-10-CM | POA: Diagnosis not present

## 2023-08-09 DIAGNOSIS — M542 Cervicalgia: Secondary | ICD-10-CM | POA: Diagnosis not present

## 2023-09-12 DIAGNOSIS — R7989 Other specified abnormal findings of blood chemistry: Secondary | ICD-10-CM | POA: Diagnosis not present

## 2023-09-12 DIAGNOSIS — I10 Essential (primary) hypertension: Secondary | ICD-10-CM | POA: Diagnosis not present

## 2023-09-15 DIAGNOSIS — R7989 Other specified abnormal findings of blood chemistry: Secondary | ICD-10-CM | POA: Diagnosis not present

## 2023-09-15 DIAGNOSIS — R739 Hyperglycemia, unspecified: Secondary | ICD-10-CM | POA: Diagnosis not present

## 2023-09-15 DIAGNOSIS — N951 Menopausal and female climacteric states: Secondary | ICD-10-CM | POA: Diagnosis not present

## 2023-09-20 ENCOUNTER — Telehealth: Payer: Self-pay

## 2023-09-20 NOTE — Telephone Encounter (Signed)
*  [facility]  Pharmacy Patient Advocate Encounter  Received notification from [facility] that Prior Authorization for Nurtec 75MG  dispersible tablets  has been CANCELLED due to patient has not been seen since 02/2022   PA #/Case ID/Reference #: [id number]

## 2023-09-22 DIAGNOSIS — R7989 Other specified abnormal findings of blood chemistry: Secondary | ICD-10-CM | POA: Diagnosis not present

## 2023-09-22 DIAGNOSIS — R739 Hyperglycemia, unspecified: Secondary | ICD-10-CM | POA: Diagnosis not present

## 2023-09-22 DIAGNOSIS — N390 Urinary tract infection, site not specified: Secondary | ICD-10-CM | POA: Diagnosis not present

## 2023-09-22 DIAGNOSIS — R5383 Other fatigue: Secondary | ICD-10-CM | POA: Diagnosis not present

## 2023-09-22 DIAGNOSIS — E039 Hypothyroidism, unspecified: Secondary | ICD-10-CM | POA: Diagnosis not present

## 2023-09-26 ENCOUNTER — Encounter (INDEPENDENT_AMBULATORY_CARE_PROVIDER_SITE_OTHER): Payer: Self-pay

## 2023-10-16 ENCOUNTER — Encounter: Admitting: Nurse Practitioner

## 2023-10-26 ENCOUNTER — Other Ambulatory Visit: Payer: Self-pay | Admitting: Nurse Practitioner

## 2023-10-26 DIAGNOSIS — N644 Mastodynia: Secondary | ICD-10-CM

## 2023-11-10 ENCOUNTER — Other Ambulatory Visit

## 2023-11-10 ENCOUNTER — Encounter

## 2023-11-20 ENCOUNTER — Ambulatory Visit: Admitting: Skilled Nursing Facility1

## 2023-11-21 DIAGNOSIS — D0511 Intraductal carcinoma in situ of right breast: Secondary | ICD-10-CM | POA: Diagnosis not present

## 2023-11-21 DIAGNOSIS — N390 Urinary tract infection, site not specified: Secondary | ICD-10-CM | POA: Diagnosis not present

## 2023-11-21 DIAGNOSIS — I10 Essential (primary) hypertension: Secondary | ICD-10-CM | POA: Diagnosis not present

## 2023-11-21 DIAGNOSIS — N952 Postmenopausal atrophic vaginitis: Secondary | ICD-10-CM | POA: Diagnosis not present

## 2023-11-21 DIAGNOSIS — N3000 Acute cystitis without hematuria: Secondary | ICD-10-CM | POA: Diagnosis not present

## 2023-11-24 DIAGNOSIS — R197 Diarrhea, unspecified: Secondary | ICD-10-CM | POA: Diagnosis not present

## 2023-11-24 DIAGNOSIS — R635 Abnormal weight gain: Secondary | ICD-10-CM | POA: Diagnosis not present

## 2023-11-24 DIAGNOSIS — N390 Urinary tract infection, site not specified: Secondary | ICD-10-CM | POA: Diagnosis not present

## 2023-11-27 ENCOUNTER — Ambulatory Visit
Admission: RE | Admit: 2023-11-27 | Discharge: 2023-11-27 | Disposition: A | Discharge status: Home / Self Care | Source: Ambulatory Visit | Attending: Nurse Practitioner | Admitting: Nurse Practitioner

## 2023-11-27 DIAGNOSIS — N644 Mastodynia: Secondary | ICD-10-CM

## 2023-11-27 DIAGNOSIS — Z853 Personal history of malignant neoplasm of breast: Secondary | ICD-10-CM | POA: Diagnosis not present

## 2023-11-28 DIAGNOSIS — E1165 Type 2 diabetes mellitus with hyperglycemia: Secondary | ICD-10-CM | POA: Diagnosis not present

## 2023-12-01 DIAGNOSIS — M791 Myalgia, unspecified site: Secondary | ICD-10-CM | POA: Diagnosis not present

## 2023-12-01 DIAGNOSIS — M5416 Radiculopathy, lumbar region: Secondary | ICD-10-CM | POA: Diagnosis not present

## 2023-12-08 DIAGNOSIS — J069 Acute upper respiratory infection, unspecified: Secondary | ICD-10-CM | POA: Diagnosis not present

## 2023-12-08 DIAGNOSIS — J329 Chronic sinusitis, unspecified: Secondary | ICD-10-CM | POA: Diagnosis not present

## 2023-12-08 DIAGNOSIS — B9689 Other specified bacterial agents as the cause of diseases classified elsewhere: Secondary | ICD-10-CM | POA: Diagnosis not present

## 2023-12-08 DIAGNOSIS — J40 Bronchitis, not specified as acute or chronic: Secondary | ICD-10-CM | POA: Diagnosis not present

## 2023-12-11 DIAGNOSIS — I10 Essential (primary) hypertension: Secondary | ICD-10-CM | POA: Diagnosis not present

## 2023-12-11 DIAGNOSIS — N390 Urinary tract infection, site not specified: Secondary | ICD-10-CM | POA: Diagnosis not present

## 2023-12-11 DIAGNOSIS — N644 Mastodynia: Secondary | ICD-10-CM | POA: Diagnosis not present

## 2023-12-11 DIAGNOSIS — M5416 Radiculopathy, lumbar region: Secondary | ICD-10-CM | POA: Diagnosis not present

## 2023-12-11 DIAGNOSIS — M961 Postlaminectomy syndrome, not elsewhere classified: Secondary | ICD-10-CM | POA: Diagnosis not present

## 2023-12-11 DIAGNOSIS — E1165 Type 2 diabetes mellitus with hyperglycemia: Secondary | ICD-10-CM | POA: Diagnosis not present

## 2023-12-11 DIAGNOSIS — M51362 Other intervertebral disc degeneration, lumbar region with discogenic back pain and lower extremity pain: Secondary | ICD-10-CM | POA: Diagnosis not present

## 2023-12-11 DIAGNOSIS — T383X5A Adverse effect of insulin and oral hypoglycemic [antidiabetic] drugs, initial encounter: Secondary | ICD-10-CM | POA: Diagnosis not present

## 2023-12-11 DIAGNOSIS — Z79899 Other long term (current) drug therapy: Secondary | ICD-10-CM | POA: Diagnosis not present

## 2023-12-11 DIAGNOSIS — Z981 Arthrodesis status: Secondary | ICD-10-CM | POA: Diagnosis not present

## 2024-01-11 DIAGNOSIS — M7541 Impingement syndrome of right shoulder: Secondary | ICD-10-CM | POA: Diagnosis not present

## 2024-01-15 ENCOUNTER — Ambulatory Visit (INDEPENDENT_AMBULATORY_CARE_PROVIDER_SITE_OTHER): Admitting: Obstetrics and Gynecology

## 2024-01-15 ENCOUNTER — Encounter: Payer: Self-pay | Admitting: Obstetrics and Gynecology

## 2024-01-15 ENCOUNTER — Other Ambulatory Visit (HOSPITAL_COMMUNITY)
Admission: RE | Admit: 2024-01-15 | Discharge: 2024-01-15 | Disposition: A | Discharge status: Home / Self Care | Source: Other Acute Inpatient Hospital | Attending: Obstetrics and Gynecology | Admitting: Obstetrics and Gynecology

## 2024-01-15 VITALS — BP 123/85 | HR 78 | Ht 66.0 in | Wt 233.2 lb

## 2024-01-15 DIAGNOSIS — R350 Frequency of micturition: Secondary | ICD-10-CM

## 2024-01-15 DIAGNOSIS — N952 Postmenopausal atrophic vaginitis: Secondary | ICD-10-CM

## 2024-01-15 DIAGNOSIS — N393 Stress incontinence (female) (male): Secondary | ICD-10-CM

## 2024-01-15 DIAGNOSIS — N3281 Overactive bladder: Secondary | ICD-10-CM

## 2024-01-15 DIAGNOSIS — R351 Nocturia: Secondary | ICD-10-CM

## 2024-01-15 DIAGNOSIS — N390 Urinary tract infection, site not specified: Secondary | ICD-10-CM

## 2024-01-15 LAB — POCT URINALYSIS DIP (CLINITEK)
Bilirubin, UA: NEGATIVE
Blood, UA: NEGATIVE
Glucose, UA: 100 mg/dL — AB
Ketones, POC UA: NEGATIVE mg/dL
Leukocytes, UA: NEGATIVE
Nitrite, UA: POSITIVE — AB
POC PROTEIN,UA: 30 — AB
Spec Grav, UA: >=1.03 — AB (ref 1.010–1.025)
Urobilinogen, UA: 0.2 U/dL
pH, UA: 5.5 (ref 5.0–8.0)

## 2024-01-15 MED ORDER — ESTRADIOL 0.1 MG/GM VA CREA: 0.5000 g | TOPICAL_CREAM | VAGINAL | 11 refills | Status: DC

## 2024-01-15 MED ORDER — MIRABEGRON ER 25 MG PO TB24: 25.0000 mg | ORAL_TABLET | Freq: Every day | ORAL | 5 refills | Status: DC
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
## 2024-01-15 NOTE — Patient Instructions (Addendum)
 Today we talked about ways to manage bladder urgency such as altering your diet to avoid irritative beverages and foods (bladder diet) as well as attempting to decrease stress and other exacerbating factors.  You can also chew a plain Tums 1-3 times per day to make your urine less acidic, especially if you have eating/drinking acidic things.    The Most Bothersome Foods* The Least Bothersome Foods*  Coffee - Regular & Decaf Tea - caffeinated Carbonated beverages - cola, non-colas, diet & caffeine-free Alcohols - Beer, Red Wine, White Wine, [address] - Grapefruit, [place], Orange, [employer] - Cranberry, Grapefruit, Orange, Pineapple Vegetables - Tomato & Tomato Products Flavor Enhancers - Hot peppers, Spicy foods, Chili, Horseradish, Vinegar, Monosodium glutamate (MSG) Artificial Sweeteners - NutraSweet, Sweet 'N Low, Equal (sweetener), Saccharin Ethnic foods - [place], [place], [place] food Water  Milk - low-fat & whole Fruits - Bananas, Blueberries, Honeydew melon, Pears, Raisins, Watermelon Vegetables - Broccoli, [address] Sprouts, [place], [place], Cauliflower, [place], Cucumber, Mushrooms, Peas, Radishes, Squash, Zucchini, White potatoes, Sweet potatoes & yams Poultry - Chicken, Eggs, [place], [employer] - Beef, Dipl[employer] operational officer, Lamb Seafood - Shrimp, [place] fish, Salmon Grains - Oat, Rice Snacks - Pretzels, Popcorn  *Mitch ALF et al. Diet and its role in interstitial cystitis/bladder pain syndrome (IC/BPS) and comorbid conditions. [employer]. BJU Int. 2012 Jan 11.    For the estrogen cream use it nightly for 2 weeks and then twice a week after. You can use your finger to apply the cream you do not have to use the applicator.   Please let me know if the Myrbetriq is too expensive. We will start on the 25mg  and then move up to 50mg  if insurance covers this. If not let me know and we will change to Trospium 20mg  twice a day.

## 2024-01-17 ENCOUNTER — Other Ambulatory Visit: Payer: Self-pay | Admitting: Obstetrics and Gynecology

## 2024-01-17 ENCOUNTER — Ambulatory Visit: Payer: Self-pay

## 2024-01-17 DIAGNOSIS — N390 Urinary tract infection, site not specified: Secondary | ICD-10-CM

## 2024-01-17 LAB — URINE CULTURE: Culture: >=100000 — AB

## 2024-01-17 MED ORDER — SULFAMETHOXAZOLE-TRIMETHOPRIM 800-160 MG PO TABS: 1.0000 | ORAL_TABLET | Freq: Two times a day (BID) | ORAL | 0 refills | Status: AC

## 2024-01-31 DIAGNOSIS — F331 Major depressive disorder, recurrent, moderate: Secondary | ICD-10-CM | POA: Diagnosis not present

## 2024-01-31 DIAGNOSIS — F411 Generalized anxiety disorder: Secondary | ICD-10-CM | POA: Diagnosis not present

## 2024-01-31 DIAGNOSIS — G4700 Insomnia, unspecified: Secondary | ICD-10-CM | POA: Diagnosis not present

## 2024-02-12 ENCOUNTER — Ambulatory Visit (INDEPENDENT_AMBULATORY_CARE_PROVIDER_SITE_OTHER): Admitting: Obstetrics and Gynecology

## 2024-02-12 VITALS — BP 124/79 | HR 73

## 2024-02-12 DIAGNOSIS — N3281 Overactive bladder: Secondary | ICD-10-CM | POA: Diagnosis not present

## 2024-02-12 DIAGNOSIS — R350 Frequency of micturition: Secondary | ICD-10-CM

## 2024-02-12 DIAGNOSIS — N393 Stress incontinence (female) (male): Secondary | ICD-10-CM | POA: Diagnosis not present

## 2024-02-12 DIAGNOSIS — N390 Urinary tract infection, site not specified: Secondary | ICD-10-CM

## 2024-02-12 LAB — POCT URINALYSIS DIP (CLINITEK)
Bilirubin, UA: NEGATIVE
Blood, UA: NEGATIVE
Glucose, UA: NEGATIVE mg/dL
Ketones, POC UA: NEGATIVE mg/dL
Leukocytes, UA: NEGATIVE
Nitrite, UA: NEGATIVE
POC PROTEIN,UA: NEGATIVE
Spec Grav, UA: 1.02 (ref 1.010–1.025)
Urobilinogen, UA: 0.2 U/dL
pH, UA: 5.5 (ref 5.0–8.0)

## 2024-02-12 MED ORDER — MIRABEGRON ER 50 MG PO TB24: 50.0000 mg | ORAL_TABLET | Freq: Every day | ORAL | 3 refills | Status: DC

## 2024-02-12 MED ORDER — METHENAMINE HIPPURATE 1 G PO TABS: 1.0000 g | ORAL_TABLET | Freq: Every day | ORAL | 2 refills | Status: DC

## 2024-02-12 NOTE — Patient Instructions (Addendum)
 Start Hiprex  for anti-infective  Continue estrogen cream x2 weekly. The GoodRx price is cheaper for the estrogen cream  Increase Myrbetriq  to 50mg  daily for bladder.   You can use magnesium  butter on the bottom of the feet to help with sleep and support.

## 2024-02-12 NOTE — Progress Notes (Signed)
 [facility] Return Visit  SUBJECTIVE  History of Present Illness: Carol [name] is a 68 y.o. female seen in follow-up for rUTI, vaginal atrophy, OAB, and SUI . Plan at last visit was start Myrbetriq  25mg  daily for OAB and UUI. Patient reports she has been doing well on the medication but the past few days she has had questions smelling urine that looked cloudy. Patient reports she has been doing her estrogen cream but it caused some itching. Patient is scheduled for urethral bulking with Dr. Marilynne next month for SUI.     Past Medical History: Patient  has a past medical history of Anxiety, Arthritis, Balance problems, Cancer (HCC), Chronic anxiety, Chronic depression, Chronic diarrhea, Chronic low back pain, Chronic seasonal allergic rhinitis, Complication of anesthesia, Dysrhythmia, Exogenous obesity, EXTERNAL HEMORRHOIDS (06/26/2007), Falls, Family history of adverse reaction to anesthesia, Family history of breast cancer, Family history of prostate cancer, Fatigue, Hemorrhoids, History of blood clots, History of blood transfusion, History of bronchitis, History of chicken pox, History of frequent urinary tract infections, History of gallstones, History of jaundice, History of kidney stones, History of measles as a child, History of mumps as a child, Hypertension, Hypothyroidism, Insomnia, Night sweats, Personal history of radiation therapy, Pneumonia, Polycythemia, PONV (postoperative nausea and vomiting), Poor sleep, Stroke (HCC), Thyroid  disease, TIA (transient ischemic attack), Tinnitus, and Type II diabetes mellitus (HCC) (11/2016).   Past Surgical History: She  has a past surgical history that includes Back surgery; Cholecystectomy (1987); Abdominal hysterectomy; Knee surgery; Tonsillectomy; left foot surgery ; menicus tear; Total knee arthroplasty (Right, 03/27/2015); Knee arthroscopy (Right, 01/07/2016); Knee arthroscopy (Right, 10/04/2017); Appendectomy (1972); L4/5  discectomy (2001); L4/5 fusion (2002); L3/4 discectomy (2003); L3/4  fusion (2004); Vaginal hysterectomy (1996); right knee meniscectomy (2012); left knee meniscectomy (2012); Colonoscopy (11/2011); total knee replacement (Right, 03/2015); right knee arthroscopy (12/2015); ENDOSCOPY with esophageal dilatation (03/14/2016); Wrist surgery (Right, 11/2016); lumbar spine ESI (12/2017); Breast biopsy (Right); Breast lumpectomy with radioactive seed localization (Right, 03/25/2021); Re-excision of breast lumpectomy (Right, 04/29/2021); Breast lumpectomy (Right, 03/2021); and Total knee arthroplasty (Left, 11/04/2022).   Medications: She has a current medication list which includes the following prescription(s): alprazolam , aspirin  ec, cariprazine, diclofenac, estradiol , lamotrigine, levothyroxine , methenamine , mirabegron  er, oxycodone , and mounjaro .   Allergies: Patient is allergic to methocarbamol , acetaminophen , celebrex [celecoxib], povidone iodine, povidone-iodine, tape, codeine, and doxycycline.   Social History: Patient  reports that she has never smoked. She has never used smokeless tobacco. She reports that she does not drink alcohol  and does not use drugs.     OBJECTIVE    Lab Results  Component Value Date   COLORU yellow 02/12/2024   CLARITYU clear 02/12/2024   GLUCOSEUR negative 02/12/2024   BILIRUBINUR negative 02/12/2024   SPECGRAV 1.020 02/12/2024   RBCUR negative 02/12/2024   PHUR 5.5 02/12/2024   PROTEINUR NEGATIVE 04/22/2023   UROBILINOGEN 0.2 02/12/2024   LEUKOCYTESUR Negative 02/12/2024     Physical Exam: Vitals:   02/12/24 1406  BP: 124/79  Pulse: 73   Gen: No apparent distress, A&O x 3.  Detailed Urogynecologic Evaluation:  Deferred.    ASSESSMENT AND PLAN    Ms. Gebel is a 68 y.o. with:  1. Urinary frequency   2. OAB (overactive bladder)   3. SUI (stress urinary incontinence, female)     Patient's urine today negative for infection. We discussed doing  some prophylaxis with Hipex 1g daily as an antiinfective. She is open to this plan of care. Plan to continue the estrogen  cream x2 weekly as well.  Patient has improved OAB symptoms that she reports she is sleeping through the night some nights but still having some urgency and frequency. Will increase to Myrbetriq  50mg  daily. Patient reports agreement with plan of care.  Patient is planning to undergo Urethral bulking with Dr. Marilynne for her SUI. Patient plans to drive herself and does not need a valium.   Patient to return for urethral bulking.  Jasmyne Lodato G Nel Stoneking, NP

## 2024-02-16 DIAGNOSIS — F331 Major depressive disorder, recurrent, moderate: Secondary | ICD-10-CM | POA: Diagnosis not present

## 2024-02-16 DIAGNOSIS — F411 Generalized anxiety disorder: Secondary | ICD-10-CM | POA: Diagnosis not present

## 2024-02-16 DIAGNOSIS — G4700 Insomnia, unspecified: Secondary | ICD-10-CM | POA: Diagnosis not present

## 2024-02-16 DIAGNOSIS — E119 Type 2 diabetes mellitus without complications: Secondary | ICD-10-CM | POA: Diagnosis not present

## 2024-02-16 DIAGNOSIS — M25551 Pain in right hip: Secondary | ICD-10-CM | POA: Diagnosis not present

## 2024-02-16 DIAGNOSIS — N390 Urinary tract infection, site not specified: Secondary | ICD-10-CM | POA: Diagnosis not present

## 2024-02-29 ENCOUNTER — Ambulatory Visit: Admitting: Obstetrics and Gynecology

## 2024-03-08 DIAGNOSIS — F331 Major depressive disorder, recurrent, moderate: Secondary | ICD-10-CM | POA: Diagnosis not present

## 2024-03-08 DIAGNOSIS — R5383 Other fatigue: Secondary | ICD-10-CM | POA: Diagnosis not present

## 2024-03-08 DIAGNOSIS — M25551 Pain in right hip: Secondary | ICD-10-CM | POA: Diagnosis not present

## 2024-03-08 DIAGNOSIS — E559 Vitamin D deficiency, unspecified: Secondary | ICD-10-CM | POA: Diagnosis not present

## 2024-03-08 DIAGNOSIS — E039 Hypothyroidism, unspecified: Secondary | ICD-10-CM | POA: Diagnosis not present

## 2024-03-08 DIAGNOSIS — E119 Type 2 diabetes mellitus without complications: Secondary | ICD-10-CM | POA: Diagnosis not present

## 2024-03-08 DIAGNOSIS — R112 Nausea with vomiting, unspecified: Secondary | ICD-10-CM | POA: Diagnosis not present

## 2024-03-08 DIAGNOSIS — F411 Generalized anxiety disorder: Secondary | ICD-10-CM | POA: Diagnosis not present

## 2024-03-08 DIAGNOSIS — G4700 Insomnia, unspecified: Secondary | ICD-10-CM | POA: Diagnosis not present

## 2024-03-08 DIAGNOSIS — R142 Eructation: Secondary | ICD-10-CM | POA: Diagnosis not present

## 2024-03-08 DIAGNOSIS — N390 Urinary tract infection, site not specified: Secondary | ICD-10-CM | POA: Diagnosis not present

## 2024-03-08 DIAGNOSIS — Z79899 Other long term (current) drug therapy: Secondary | ICD-10-CM | POA: Diagnosis not present

## 2024-03-09 LAB — LAB REPORT - SCANNED
A1c: 5.8
EGFR: 56
TSH: 9.83 — AB (ref 0.41–5.90)

## 2024-03-11 DIAGNOSIS — R898 Other abnormal findings in specimens from other organs, systems and tissues: Secondary | ICD-10-CM | POA: Diagnosis not present

## 2024-03-11 DIAGNOSIS — E1165 Type 2 diabetes mellitus with hyperglycemia: Secondary | ICD-10-CM | POA: Diagnosis not present

## 2024-03-11 DIAGNOSIS — F430 Acute stress reaction: Secondary | ICD-10-CM | POA: Diagnosis not present

## 2024-03-11 DIAGNOSIS — F410 Panic disorder [episodic paroxysmal anxiety] without agoraphobia: Secondary | ICD-10-CM | POA: Diagnosis not present

## 2024-03-11 DIAGNOSIS — R112 Nausea with vomiting, unspecified: Secondary | ICD-10-CM | POA: Diagnosis not present

## 2024-03-11 DIAGNOSIS — E559 Vitamin D deficiency, unspecified: Secondary | ICD-10-CM | POA: Diagnosis not present

## 2024-03-11 DIAGNOSIS — E039 Hypothyroidism, unspecified: Secondary | ICD-10-CM | POA: Diagnosis not present

## 2024-03-11 DIAGNOSIS — R7989 Other specified abnormal findings of blood chemistry: Secondary | ICD-10-CM | POA: Diagnosis not present

## 2024-03-11 DIAGNOSIS — E119 Type 2 diabetes mellitus without complications: Secondary | ICD-10-CM | POA: Diagnosis not present

## 2024-03-18 ENCOUNTER — Encounter: Payer: Self-pay | Admitting: Internal Medicine

## 2024-03-18 NOTE — Progress Notes (Signed)
 Received a fax from Dr. Waylan with this patient's recent labs.  Labs 03/08/24: CBC with mildly elevated abs eos of 1.3. CMP with mildly leevated Cr of 1.09 and mildly elevated alk phos 129. Amylase and lipase were normal. TSH was elevated at 9.83. HbA1C was 5.8%. Vit D level was low at 26.6. U/A with 3+ leukocytes, positive nitrites. Ucx with mixed urogenital flora.

## 2024-04-01 DIAGNOSIS — E039 Hypothyroidism, unspecified: Secondary | ICD-10-CM | POA: Diagnosis not present

## 2024-04-01 DIAGNOSIS — G4700 Insomnia, unspecified: Secondary | ICD-10-CM | POA: Diagnosis not present

## 2024-04-01 DIAGNOSIS — F411 Generalized anxiety disorder: Secondary | ICD-10-CM | POA: Diagnosis not present

## 2024-04-01 DIAGNOSIS — F331 Major depressive disorder, recurrent, moderate: Secondary | ICD-10-CM | POA: Diagnosis not present

## 2024-04-01 DIAGNOSIS — E119 Type 2 diabetes mellitus without complications: Secondary | ICD-10-CM | POA: Diagnosis not present

## 2024-04-01 DIAGNOSIS — R062 Wheezing: Secondary | ICD-10-CM | POA: Diagnosis not present

## 2024-04-02 DIAGNOSIS — R5383 Other fatigue: Secondary | ICD-10-CM | POA: Diagnosis not present

## 2024-04-02 DIAGNOSIS — I10 Essential (primary) hypertension: Secondary | ICD-10-CM | POA: Diagnosis not present

## 2024-04-02 DIAGNOSIS — R0600 Dyspnea, unspecified: Secondary | ICD-10-CM | POA: Diagnosis not present

## 2024-04-02 DIAGNOSIS — R7989 Other specified abnormal findings of blood chemistry: Secondary | ICD-10-CM | POA: Diagnosis not present

## 2024-04-02 DIAGNOSIS — E039 Hypothyroidism, unspecified: Secondary | ICD-10-CM | POA: Diagnosis not present

## 2024-04-02 DIAGNOSIS — R718 Other abnormality of red blood cells: Secondary | ICD-10-CM | POA: Diagnosis not present

## 2024-04-02 DIAGNOSIS — R898 Other abnormal findings in specimens from other organs, systems and tissues: Secondary | ICD-10-CM | POA: Diagnosis not present

## 2024-04-02 DIAGNOSIS — R062 Wheezing: Secondary | ICD-10-CM | POA: Diagnosis not present

## 2024-04-03 ENCOUNTER — Other Ambulatory Visit: Payer: Self-pay | Admitting: Family Medicine

## 2024-04-03 DIAGNOSIS — Z1231 Encounter for screening mammogram for malignant neoplasm of breast: Secondary | ICD-10-CM

## 2024-04-04 ENCOUNTER — Ambulatory Visit: Admitting: Obstetrics and Gynecology

## 2024-04-07 DIAGNOSIS — T8484XD Pain due to internal orthopedic prosthetic devices, implants and grafts, subsequent encounter: Secondary | ICD-10-CM | POA: Diagnosis not present

## 2024-04-07 DIAGNOSIS — M961 Postlaminectomy syndrome, not elsewhere classified: Secondary | ICD-10-CM | POA: Diagnosis not present

## 2024-04-07 DIAGNOSIS — M5416 Radiculopathy, lumbar region: Secondary | ICD-10-CM | POA: Diagnosis not present

## 2024-04-07 DIAGNOSIS — M47816 Spondylosis without myelopathy or radiculopathy, lumbar region: Secondary | ICD-10-CM | POA: Diagnosis not present

## 2024-04-08 DIAGNOSIS — N390 Urinary tract infection, site not specified: Secondary | ICD-10-CM | POA: Diagnosis not present

## 2024-04-08 DIAGNOSIS — R0683 Snoring: Secondary | ICD-10-CM | POA: Diagnosis not present

## 2024-04-08 DIAGNOSIS — I10 Essential (primary) hypertension: Secondary | ICD-10-CM | POA: Diagnosis not present

## 2024-04-08 DIAGNOSIS — R030 Elevated blood-pressure reading, without diagnosis of hypertension: Secondary | ICD-10-CM | POA: Diagnosis not present

## 2024-04-08 DIAGNOSIS — N3000 Acute cystitis without hematuria: Secondary | ICD-10-CM | POA: Diagnosis not present

## 2024-04-08 DIAGNOSIS — E1165 Type 2 diabetes mellitus with hyperglycemia: Secondary | ICD-10-CM | POA: Diagnosis not present

## 2024-04-15 ENCOUNTER — Ambulatory Visit
Admission: RE | Admit: 2024-04-15 | Discharge: 2024-04-15 | Disposition: A | Discharge status: Home / Self Care | Source: Ambulatory Visit | Attending: Family Medicine | Admitting: Family Medicine

## 2024-04-15 DIAGNOSIS — Z1231 Encounter for screening mammogram for malignant neoplasm of breast: Secondary | ICD-10-CM

## 2024-04-21 DIAGNOSIS — G4733 Obstructive sleep apnea (adult) (pediatric): Secondary | ICD-10-CM | POA: Diagnosis not present

## 2024-04-30 ENCOUNTER — Other Ambulatory Visit (HOSPITAL_COMMUNITY)
Admission: RE | Admit: 2024-04-30 | Discharge: 2024-04-30 | Disposition: A | Discharge status: Home / Self Care | Source: Other Acute Inpatient Hospital | Attending: Obstetrics and Gynecology | Admitting: Obstetrics and Gynecology

## 2024-04-30 ENCOUNTER — Ambulatory Visit

## 2024-04-30 VITALS — BP 135/84 | HR 94

## 2024-04-30 DIAGNOSIS — R350 Frequency of micturition: Secondary | ICD-10-CM | POA: Diagnosis not present

## 2024-04-30 DIAGNOSIS — R82998 Other abnormal findings in urine: Secondary | ICD-10-CM | POA: Insufficient documentation

## 2024-04-30 LAB — POCT URINALYSIS DIP (CLINITEK)
Bilirubin, UA: NEGATIVE
Blood, UA: NEGATIVE
Glucose, UA: NEGATIVE mg/dL
Ketones, POC UA: NEGATIVE mg/dL
Nitrite, UA: POSITIVE — AB
POC PROTEIN,UA: NEGATIVE
Spec Grav, UA: 1.01 (ref 1.010–1.025)
Urobilinogen, UA: 0.2 U/dL
pH, UA: 5.5 (ref 5.0–8.0)

## 2024-04-30 MED ORDER — SULFAMETHOXAZOLE-TRIMETHOPRIM 800-160 MG PO TABS: 1.0000 | ORAL_TABLET | Freq: Two times a day (BID) | ORAL | 0 refills | Status: DC

## 2024-04-30 NOTE — Progress Notes (Signed)
 Carol [name] is a 68 y.o. female  arrived today with UTI sx.  Per Dr. Susen protocol: A urine specimen was collected and POCT Urine was done and urine culture sent to the lab. POCT Urine was POSITIVE for Luekocytes and Nitrates Pt was notified and prescription sent to the preferred pharmacy.

## 2024-04-30 NOTE — Patient Instructions (Signed)
 Your Urine dip that was done in office was Positive. I am sending the urine off for culture and you can take AZO over the counter for your discomfort.  We have ordered Bactrim for you to take while we wait for your culture results, hopefully this gives you some relief. We will contact you when the results are back between 3-5 days.  If a different antibiotic is needed we will sent the order to the pharmacy and you will be notified. If you have any questions or concerns please feel free to call us at [phone]

## 2024-05-03 ENCOUNTER — Ambulatory Visit: Payer: Self-pay | Admitting: Obstetrics and Gynecology

## 2024-05-03 DIAGNOSIS — N3000 Acute cystitis without hematuria: Secondary | ICD-10-CM

## 2024-05-03 LAB — URINE CULTURE: Culture: >=100000 — AB

## 2024-05-03 MED ORDER — NITROFURANTOIN MONOHYD MACRO 100 MG PO CAPS: 100.0000 mg | ORAL_CAPSULE | Freq: Two times a day (BID) | ORAL | 0 refills | Status: AC

## 2024-05-06 DIAGNOSIS — M5459 Other low back pain: Secondary | ICD-10-CM | POA: Diagnosis not present

## 2024-05-15 DIAGNOSIS — R739 Hyperglycemia, unspecified: Secondary | ICD-10-CM | POA: Diagnosis not present

## 2024-05-15 DIAGNOSIS — I10 Essential (primary) hypertension: Secondary | ICD-10-CM | POA: Diagnosis not present

## 2024-05-15 DIAGNOSIS — R5383 Other fatigue: Secondary | ICD-10-CM | POA: Diagnosis not present

## 2024-05-17 ENCOUNTER — Encounter (HOSPITAL_COMMUNITY): Payer: Self-pay

## 2024-05-17 ENCOUNTER — Ambulatory Visit (HOSPITAL_COMMUNITY)
Admission: RE | Admit: 2024-05-17 | Discharge: 2024-05-17 | Disposition: A | Discharge status: Home / Self Care | Source: Ambulatory Visit | Attending: Internal Medicine | Admitting: Internal Medicine

## 2024-05-17 ENCOUNTER — Ambulatory Visit (HOSPITAL_COMMUNITY)

## 2024-05-17 VITALS — BP 178/118 | HR 87 | Temp 98.5°F | Resp 16

## 2024-05-17 DIAGNOSIS — J209 Acute bronchitis, unspecified: Secondary | ICD-10-CM

## 2024-05-17 DIAGNOSIS — R030 Elevated blood-pressure reading, without diagnosis of hypertension: Secondary | ICD-10-CM | POA: Diagnosis not present

## 2024-05-17 DIAGNOSIS — R0602 Shortness of breath: Secondary | ICD-10-CM | POA: Diagnosis not present

## 2024-05-17 DIAGNOSIS — J069 Acute upper respiratory infection, unspecified: Secondary | ICD-10-CM | POA: Diagnosis not present

## 2024-05-17 DIAGNOSIS — R059 Cough, unspecified: Secondary | ICD-10-CM | POA: Diagnosis not present

## 2024-05-17 LAB — POC RSV: RSV Antigen, POC: NEGATIVE

## 2024-05-17 LAB — POC COVID19/FLU A&B COMBO
Covid Antigen, POC: NEGATIVE
Influenza A Antigen, POC: NEGATIVE
Influenza B Antigen, POC: NEGATIVE

## 2024-05-17 MED ORDER — ALBUTEROL SULFATE HFA 108 (90 BASE) MCG/ACT IN AERS
INHALATION_SPRAY | RESPIRATORY_TRACT | Status: AC
  Filled 2024-05-17: qty 6.7

## 2024-05-17 MED ORDER — GUAIFENESIN ER 1200 MG PO TB12: 1200.0000 mg | ORAL_TABLET | Freq: Two times a day (BID) | ORAL | 0 refills | Status: DC

## 2024-05-17 MED ORDER — PROMETHAZINE-DM 6.25-15 MG/5ML PO SYRP: 5.0000 mL | ORAL_SOLUTION | Freq: Every evening | ORAL | 0 refills | Status: DC | PRN

## 2024-05-17 MED ORDER — ALBUTEROL SULFATE HFA 108 (90 BASE) MCG/ACT IN AERS
2.0000 | INHALATION_SPRAY | Freq: Once | RESPIRATORY_TRACT | Status: AC
  Administered 2024-05-17: 2 via RESPIRATORY_TRACT

## 2024-05-17 NOTE — ED Provider Notes (Addendum)
 [facility]    CSN: [id number] Arrival date & time: 05/17/24  1637      History   Chief Complaint Chief Complaint  Patient presents with   Wheezing    Have had cough, congestion and wheezing for 2 days. - Entered by patient    HPI Carol [name] is a 68 y.o. female.   Carol [name] is a 68 y.o. female presenting for chief complaint of cough, nasal congestion, generalized fatigue, and bilateral chest tightness/shortness of breath with coughing that started yesterday.  She was recently exposed to her granddaughter who is currently sick with similar symptoms and receiving treatment for bronchopneumonia.  Patient's cough is somewhat productive with yellow/green sputum.  Denies history of asthma, she was exposed to secondhand smoke for many years as her mother was a smoker.  Denies history of frequent bronchitis.  Denies fever, chills, nausea, vomiting, diarrhea, abdominal pain, ear pain, and rashes.  Denies recent antibiotic use in the last 90 days.  She is currently taking prednisone  for her back pain prescribed by her orthopedic provider.  She is on a 3-week prednisone  taper.  She has not attempted use of any other over-the-counter medications to help with cough/cold symptoms PTA.  Of note, patient had a pulmonary embolism 2 years ago.  She took Eliquis for 6 months and was cleared by pulmonology.  She now takes a daily baby aspirin  for prevention.  Additionally, blood pressure is elevated in triage and on recheck.  Recheck blood pressure is 189/99.  She does not have a history of hypertension and does not take any medications for high blood pressure.  She has been very stressed recently and her son has been in the hospital. Denies CP, SOB, palpitations, dizziness, extremity weakness, paresthesias.  States her blood pressures are usually normal in the 120s over 80s when she is at home.   Wheezing   Past Medical History:  Diagnosis Date   Anxiety    Arthritis    Balance  problems    Cancer (HCC)    Chronic anxiety    Chronic depression    Chronic diarrhea    after gallbladder surgery   Chronic low back pain    Chronic seasonal allergic rhinitis    Complication of anesthesia    pt states has difficulty with being put to sleep    Dysrhythmia    Exogenous obesity    EXTERNAL HEMORRHOIDS 06/26/2007   Annotation: and internal Qualifier: Diagnosis of  By: Jacqueline Riggs     Falls    Family history of adverse reaction to anesthesia    pts daughter has N&V   Family history of breast cancer    Family history of prostate cancer    Fatigue    Hemorrhoids    History of blood clots    History of blood transfusion    2006   History of bronchitis    History of chicken pox    History of frequent urinary tract infections    History of gallstones    History of jaundice    History of kidney stones    History of measles as a child    History of mumps as a child    Hypertension    Hypothyroidism    Insomnia    Night sweats    Personal history of radiation therapy    Pneumonia    hx of    Polycythemia    PONV (postoperative nausea and vomiting)    Poor  sleep    chronic poor quality sleep wit snoring, sleep study in Nov 2013 showed mild central sleep apnea with hypoxia felt due to narcotics, no significant OSA or RLS   Stroke [facility])    Thyroid  disease    TIA (transient ischemic attack)    Tinnitus    Type II diabetes mellitus (HCC) 11/2016    Patient Active Problem List   Diagnosis Date Noted   Pulmonary embolism (HCC) 12/23/2022   Left knee DJD 11/04/2022   TIA (transient ischemic attack) 12/12/2021   Hypothyroidism 12/12/2021   Anxiety 12/12/2021   Chronic low back pain 12/12/2021   Genetic testing 02/22/2021   Family history of prostate cancer 02/11/2021   Family history of breast cancer 02/11/2021   Ductal carcinoma in situ (DCIS) of right breast 02/08/2021   Exertional dyspnea 05/10/2019   Major depressive disorder, recurrent episode,  severe (HCC) 02/10/2016   Primary osteoarthritis of right knee 03/27/2015   Right knee DJD 03/27/2015   Diverticulosis of colon 06/26/2007    Past Surgical History:  Procedure Laterality Date   ABDOMINAL HYSTERECTOMY     APPENDECTOMY  1972   BACK SURGERY     BREAST BIOPSY Right    years ago- unsure when   BREAST LUMPECTOMY Right 03/2021   BREAST LUMPECTOMY WITH RADIOACTIVE SEED LOCALIZATION Right 03/25/2021   Procedure: RIGHT BREAST LUMPECTOMY WITH RADIOACTIVE SEED LOCALIZATION X2;  Surgeon: Aron Shoulders, MD;  Location: [facility];  Service: General;  Laterality: Right;   CHOLECYSTECTOMY  1987   COLONOSCOPY  11/2011   showed mild diverticulosis, internal hemorrhoids, and no polyps   ENDOSCOPY with esophageal dilatation  03/14/2016   KNEE ARTHROSCOPY Right 01/07/2016   Procedure: ARTHROSCOPY RIGHT KNEE, DEBRIDEMENT OF ARTHROFIBROSIS, AND EXAM UNDER ANESTHESIA;  Surgeon: Reyes Billing, MD;  Location: [facility];  Service: Orthopedics;  Laterality: Right;   KNEE ARTHROSCOPY Right 10/04/2017   Procedure: Right knee arthroscopy, evaluation under anesthesia, lysis of adhesions;  Surgeon: Billing Reyes, MD;  Location: [facility];  Service: Orthopedics;  Laterality: Right;  60 mins   KNEE SURGERY     L3/4  fusion  2004   L3/4 discectomy  2003   L4/5 discectomy  2001   L4/5 fusion  2002   left foot surgery      to repair break - 2002   left knee meniscectomy  2012   lumbar spine ESI  12/2017   menicus tear     bilat;    RE-EXCISION OF BREAST LUMPECTOMY Right 04/29/2021   Procedure: RE-EXCISION OF RIGHT BREAST LUMPECTOMY;  Surgeon: Aron Shoulders, MD;  Location: [facility];  Service: General;  Laterality: Right;   right knee arthroscopy  12/2015   Dr. Billing   right knee meniscectomy  2012   TONSILLECTOMY     TOTAL KNEE ARTHROPLASTY Right 03/27/2015   Procedure: RIGHT TOTAL KNEE ARTHROPLASTY;  Surgeon: Reyes Billing, MD;  Location: [facility];  Service: Orthopedics;  Laterality: Right;   TOTAL KNEE  ARTHROPLASTY Left 11/04/2022   Procedure: TOTAL KNEE ARTHROPLASTY;  Surgeon: Billing Reyes, MD;  Location: [facility];  Service: Orthopedics;  Laterality: Left;   total knee replacement Right 03/2015   VAGINAL HYSTERECTOMY  1996   with ovaries intact, accidental bladder laceration   WRIST SURGERY Right 11/2016   Dr. Camella    OB History     Gravida  5   Para  2   Term  2   Preterm      AB  3  Living  2      SAB  3   IAB      Ectopic      Multiple      Live Births               Home Medications    Prior to Admission medications   Medication Sig Start Date End Date Taking? Authorizing Provider  Guaifenesin 1200 MG TB12 Take 1 tablet (1,200 mg total) by mouth in the morning and at bedtime. 05/17/24  Yes Enedelia Dorna HERO, FNP  promethazine -dextromethorphan (PROMETHAZINE -DM) 6.25-15 MG/5ML syrup Take 5 mLs by mouth at bedtime as needed for cough. 05/17/24  Yes Enedelia Dorna HERO, FNP  ALPRAZolam  (XANAX ) 0.25 MG tablet Take by mouth. 06/07/23   [provider]  aspirin  EC 81 MG tablet Take 81 mg by mouth daily. Swallow whole.    [provider]  levothyroxine  (SYNTHROID ) 100 MCG tablet Take 100 mcg by mouth daily before breakfast.    [provider]  oxyCODONE  (OXY IR/ROXICODONE ) 5 MG immediate release tablet Take 1 tablet 4 times a day by oral route as needed for 30 days, for pain. 12/11/23   [provider]    Family History Family History  Problem Relation Age of Onset   Hypertension Mother    COPD Mother    Diabetes Mother    Heart attack Father 32   Prostate cancer Maternal Uncle 90   Myasthenia gravis Daughter    Depression Daughter    Other Son        esophageal dysmotility, [employer]   Diabetes type II Other    Other Other        premature cardiovascular disease   Breast cancer Other 60       bilateral, mother's first cousin   Prostate cancer Other        dx 74s, maternal cousin's son   Colon  polyps Neg Hx    Crohn's disease Neg Hx    Esophageal cancer Neg Hx    Rectal cancer Neg Hx    Stomach cancer Neg Hx     Social History Social History   Tobacco Use   Smoking status: Never   Smokeless tobacco: Never  Vaping Use   Vaping status: Never Used  Substance Use Topics   Alcohol  use: No   Drug use: No     Allergies   Methocarbamol , Acetaminophen , Celebrex [celecoxib], Povidone iodine, Povidone-iodine, Tape, Codeine, and Doxycycline   Review of Systems Review of Systems  Respiratory:  Positive for wheezing.   Per HPI   Physical Exam Triage Vital Signs ED Triage Vitals  Encounter Vitals Group     BP 05/17/24 1704 (!) 178/118     Girls Systolic BP Percentile --      Girls Diastolic BP Percentile --      Boys Systolic BP Percentile --      Boys Diastolic BP Percentile --      Pulse Rate 05/17/24 1704 87     Resp 05/17/24 1704 16     Temp 05/17/24 1704 98.5 F (36.9 C)     Temp Source 05/17/24 1704 Oral     SpO2 05/17/24 1704 96 %     Weight --      Height --      Head Circumference --      Peak Flow --      Pain Score 05/17/24 1705 5     Pain Loc --  Pain Education --      Exclude from Growth Chart --    No data found.  Updated Vital Signs BP (!) 178/118 (BP Location: Right Arm)   Pulse 87   Temp 98.5 F (36.9 C) (Oral)   Resp 16   SpO2 96%   Visual Acuity Right Eye Distance:   Left Eye Distance:   Bilateral Distance:    Right Eye Near:   Left Eye Near:    Bilateral Near:     Physical Exam Vitals and nursing note reviewed.  Constitutional:      Appearance: She is not ill-appearing or toxic-appearing.  HENT:     Head: Normocephalic and atraumatic.     Right Ear: Hearing, tympanic membrane, ear canal and external ear normal.     Left Ear: Hearing, tympanic membrane, ear canal and external ear normal.     Nose: Congestion present.     Mouth/Throat:     Lips: Pink.     Mouth: Mucous membranes are moist. No injury or oral  lesions.     Dentition: Normal dentition.     Tongue: No lesions.     Pharynx: Oropharynx is clear. Uvula midline. No pharyngeal swelling, oropharyngeal exudate, posterior oropharyngeal erythema, uvula swelling or postnasal drip.     Tonsils: No tonsillar exudate.  Eyes:     General: Lids are normal. Vision grossly intact. Gaze aligned appropriately.     Extraocular Movements: Extraocular movements intact.     Conjunctiva/sclera: Conjunctivae normal.  Neck:     Trachea: Trachea and phonation normal.  Cardiovascular:     Rate and Rhythm: Normal rate and regular rhythm.     Heart sounds: Normal heart sounds, S1 normal and S2 normal.  Pulmonary:     Effort: Pulmonary effort is normal. No respiratory distress.     Breath sounds: Normal breath sounds and air entry.     Comments: Coarse breath sounds throughout.  Dry cough elicited with deep inspiration.  Musculoskeletal:     Cervical back: Neck supple.  Lymphadenopathy:     Cervical: No cervical adenopathy.  Skin:    General: Skin is warm and dry.     Capillary Refill: Capillary refill takes less than 2 seconds.     Findings: No rash.  Neurological:     General: No focal deficit present.     Mental Status: She is alert and oriented to person, place, and time. Mental status is at baseline.     Cranial Nerves: No dysarthria or facial asymmetry.  Psychiatric:        Mood and Affect: Mood normal.        Speech: Speech normal.        Behavior: Behavior normal.        Thought Content: Thought content normal.        Judgment: Judgment normal.      UC Treatments / Results  Labs (all labs ordered are listed, but only abnormal results are displayed) Labs Reviewed  POC COVID19/FLU A&B COMBO  POC RSV    EKG   Radiology No results found.  Procedures Procedures (including critical care time)  Medications Ordered in UC Medications  albuterol  (VENTOLIN  HFA) 108 (90 Base) MCG/ACT inhaler 2 puff (2 puffs Inhalation Given 05/17/24  1755)    Initial Impression / Assessment and Plan / UC Course  I have reviewed the triage vital signs and the nursing notes.  Pertinent labs & imaging results that were available during my care of the patient  were reviewed by me and considered in my medical decision making (see chart for details).   1.  Viral URI with cough, acute bronchitis Presentation consistent with acute viral bronchitis.   Viral testing: Point-of-care COVID-19, flu, and RSV testing are all negative.  Chest x-ray shows normal findings without acute cardiopulmonary abnormality/focal consolidation or pneumonia by my interpretation on wet read.  Previous chest x-ray from Dec 06, 2022 used for comparison.  Patient discharged prior to radiology re-read, staff will notify patient if radiology re-read requires change in treatment plan at discussed prior to discharge from office.    Continue steroid taper as prescribed by orthopedic provider as this will help with bronchitis as well. Mucinex and Promethazine  DM to treat other symptoms. Albuterol  2 puffs every 4-6 hours as needed for shortness of breath and wheezing.  2. Elevated blood pressure reading in office without diagnosis of hypertension BP elevated in clinic today likely secondary to acute illness and stress. No red flag signs/symptoms indicating need for referral to ED due to elevated BP.  Discussed lifestyle and dietary changes to lower BP further. Advised to continue taking BP medication as prescribed and follow-up with PCP to discuss management of HTN further.  BP Readings from Last 3 Encounters:  05/17/24 (!) 178/118  04/30/24 135/84  02/12/24 124/79   Counseled patient on potential for adverse effects with medications prescribed/recommended today, strict ER and return-to-clinic precautions discussed, patient verbalized understanding.    Final Clinical Impressions(s) / UC Diagnoses   Final diagnoses:  Viral URI with cough  Acute bronchitis, unspecified  organism  Elevated blood pressure reading in office without diagnosis of hypertension     Discharge Instructions      You have bronchitis which is inflammation of the upper airways in your lungs due to a virus.   Your chest x-ray shows normal findings on my read, staff will call if radiology reread shows abnormal findings requiring change in treatment plan.  Continue prednisone  taper as prescribed by your back specialist.  This will help to treat your bronchitis as well.  Use albuterol  inhaler 2 puffs every 4-6 hours on a schedule for the next 24 hours while the steroid kicks in, then as needed for cough, shortness of breath, and wheezing.   Use guaifenesin (plain mucinex) to break up congestion in nose/chest so that you are able to excrete easier. Drink plenty of fluids to stay well hydrated while taking mucinex so that it works well in the body.   Promethazine  DM cough syrup may be used at bedtime for coughing, this medicine will cause you to be sleepy so only take at bedtime.   If you develop any new or worsening symptoms or if your symptoms do not start to improve, please return here or follow-up with your primary care provider. If your symptoms are severe, please go to the emergency room.     ED Prescriptions     Medication Sig Dispense Auth. Provider   promethazine -dextromethorphan (PROMETHAZINE -DM) 6.25-15 MG/5ML syrup Take 5 mLs by mouth at bedtime as needed for cough. 118 mL Enedelia Going M, FNP   Guaifenesin 1200 MG TB12 Take 1 tablet (1,200 mg total) by mouth in the morning and at bedtime. 14 tablet Enedelia Going HERO, FNP      PDMP not reviewed this encounter.   Enedelia Going HERO, FNP 05/17/24 1842    Enedelia Going HERO, FNP 05/17/24 1842

## 2024-05-17 NOTE — ED Triage Notes (Signed)
 Patient here today with c/o wheezing, cough, and chest tightness X 1 days. Her granddaughter had pneumonia last week and was on antibiotics for it. Patient has recently had lab work and noticed that her WBC was elevated.

## 2024-05-17 NOTE — Discharge Instructions (Signed)
 You have bronchitis which is inflammation of the upper airways in your lungs due to a virus.   Your chest x-ray shows normal findings on my read, staff will call if radiology reread shows abnormal findings requiring change in treatment plan.  Continue prednisone  taper as prescribed by your back specialist.  This will help to treat your bronchitis as well.  Use albuterol  inhaler 2 puffs every 4-6 hours on a schedule for the next 24 hours while the steroid kicks in, then as needed for cough, shortness of breath, and wheezing.   Use guaifenesin (plain mucinex) to break up congestion in nose/chest so that you are able to excrete easier. Drink plenty of fluids to stay well hydrated while taking mucinex so that it works well in the body.   Promethazine  DM cough syrup may be used at bedtime for coughing, this medicine will cause you to be sleepy so only take at bedtime.   If you develop any new or worsening symptoms or if your symptoms do not start to improve, please return here or follow-up with your primary care provider. If your symptoms are severe, please go to the emergency room.

## 2024-05-20 ENCOUNTER — Ambulatory Visit: Payer: Self-pay | Admitting: Internal Medicine

## 2024-05-20 DIAGNOSIS — M5416 Radiculopathy, lumbar region: Secondary | ICD-10-CM | POA: Diagnosis not present

## 2024-05-20 DIAGNOSIS — R7303 Prediabetes: Secondary | ICD-10-CM | POA: Diagnosis not present

## 2024-05-20 DIAGNOSIS — I10 Essential (primary) hypertension: Secondary | ICD-10-CM | POA: Diagnosis not present

## 2024-05-20 DIAGNOSIS — Z79899 Other long term (current) drug therapy: Secondary | ICD-10-CM | POA: Diagnosis not present

## 2024-05-20 DIAGNOSIS — E1165 Type 2 diabetes mellitus with hyperglycemia: Secondary | ICD-10-CM | POA: Diagnosis not present

## 2024-05-20 DIAGNOSIS — F411 Generalized anxiety disorder: Secondary | ICD-10-CM | POA: Diagnosis not present

## 2024-05-20 DIAGNOSIS — E559 Vitamin D deficiency, unspecified: Secondary | ICD-10-CM | POA: Diagnosis not present

## 2024-05-20 DIAGNOSIS — G4700 Insomnia, unspecified: Secondary | ICD-10-CM | POA: Diagnosis not present

## 2024-05-20 DIAGNOSIS — E039 Hypothyroidism, unspecified: Secondary | ICD-10-CM | POA: Diagnosis not present

## 2024-05-20 DIAGNOSIS — F331 Major depressive disorder, recurrent, moderate: Secondary | ICD-10-CM | POA: Diagnosis not present

## 2024-06-04 DIAGNOSIS — M545 Low back pain, unspecified: Secondary | ICD-10-CM | POA: Diagnosis not present

## 2024-06-12 ENCOUNTER — Telehealth: Admitting: Physician Assistant

## 2024-06-12 ENCOUNTER — Ambulatory Visit: Admitting: Pediatrics

## 2024-06-12 DIAGNOSIS — R3989 Other symptoms and signs involving the genitourinary system: Secondary | ICD-10-CM | POA: Diagnosis not present

## 2024-06-12 MED ORDER — CEPHALEXIN 500 MG PO CAPS: 500.0000 mg | ORAL_CAPSULE | Freq: Two times a day (BID) | ORAL | 0 refills | Status: DC

## 2024-06-12 NOTE — Patient Instructions (Signed)
 Carol [name], thank you for joining Delon [name], PA-C for today's virtual visit.  While this provider is not your primary care provider (PCP), if your PCP is located in our provider database this encounter information will be shared with them immediately following your visit.   A [facility] MyChart account gives you access to today's visit and all your visits, tests, and labs performed at [facility]  click here if you don't have a [facility] MyChart account or go to [email or web address]  Consent: (Patient) Carol [name] provided verbal consent for this virtual visit at the beginning of the encounter.  Current Medications:  Current Outpatient Medications:    cephALEXin  (KEFLEX ) 500 MG capsule, Take 1 capsule (500 mg total) by mouth 2 (two) times daily., Disp: 14 capsule, Rfl: 0   ALPRAZolam  (XANAX ) 0.25 MG tablet, Take by mouth., Disp: , Rfl:    aspirin  EC 81 MG tablet, Take 81 mg by mouth daily. Swallow whole., Disp: , Rfl:    Guaifenesin  1200 MG TB12, Take 1 tablet (1,200 mg total) by mouth in the morning and at bedtime., Disp: 14 tablet, Rfl: 0   levothyroxine  (SYNTHROID ) 100 MCG tablet, Take 100 mcg by mouth daily before breakfast., Disp: , Rfl:    oxyCODONE  (OXY IR/ROXICODONE ) 5 MG immediate release tablet, Take 1 tablet 4 times a day by oral route as needed for 30 days, for pain., Disp: , Rfl:    promethazine -dextromethorphan (PROMETHAZINE -DM) 6.25-15 MG/5ML syrup, Take 5 mLs by mouth at bedtime as needed for cough., Disp: 118 mL, Rfl: 0   Medications ordered in this encounter:  Meds ordered this encounter  Medications   cephALEXin  (KEFLEX ) 500 MG capsule    Sig: Take 1 capsule (500 mg total) by mouth 2 (two) times daily.    Dispense:  14 capsule    Refill:  0    Supervising Provider:   BLAISE ALEENE KIDD [[id number]]     *If you need refills on other medications prior to your next appointment, please contact your pharmacy*  Follow-Up: Call back or seek  an in-person evaluation if the symptoms worsen or if the condition fails to improve as anticipated.  [facility] [phone]  Other Instructions Urinary Tract Infection, Female A urinary tract infection (UTI) is an infection in your urinary tract. The urinary tract is made up of organs that make, store, and get rid of pee (urine) in your body. These organs include: The kidneys. The ureters. The bladder. The urethra. What are the causes? Most UTIs are caused by germs called bacteria. They may be in or near your genitals. These germs grow and cause swelling in your urinary tract. What increases the risk? You're more likely to get a UTI if: You're a female. The urethra is shorter in females than in males. You have a soft tube called a catheter that drains your pee. You can't control when you pee or poop. You have trouble peeing because of: A kidney stone. A urinary blockage. A nerve condition that affects your bladder. Not getting enough to drink. You're sexually active. You use a birth control inside your vagina, like spermicide. You're pregnant. You have low levels of the hormone estrogen in your body. You're an older adult. You're also more likely to get a UTI if you have other health problems. These may include: Diabetes. A weak immune system. Your immune system is your body's defense system. Sickle cell disease. Injury of the spine. What are the signs or  symptoms? Symptoms may include: Needing to pee right away. Peeing small amounts often. Pain or burning when you pee. Blood in your pee. Pee that smells bad or odd. Pain in your belly or lower back. You may also: Feel confused. This may be the first symptom in older adults. Vomit. Not feel hungry. Feel tired or easily annoyed. Have a fever or chills. How is this diagnosed? A UTI is diagnosed based on your medical history and an exam. You may also have other tests. These may include: Pee tests. Blood  tests. Tests for sexually transmitted infections (STIs). If you've had more than one UTI, you may need to have imaging studies done to find out why you keep getting them. How is this treated? A UTI can be treated by: Taking antibiotics or other medicines. Drinking enough fluid to keep your pee pale yellow. In rare cases, a UTI can cause a very bad condition called sepsis. Sepsis may be treated in the hospital. Follow these instructions at home: Medicines Take your medicines only as told by your health care provider. If you were given antibiotics, take them as told by your provider. Do not stop taking them even if you start to feel better. General instructions Make sure you: Pee often and fully. Do not hold your pee for a long time. Wipe from front to back after you pee or poop. Use each tissue only once when you wipe. Pee after you have sex. Do not douche or use sprays or powders in your genital area. Contact a health care provider if: Your symptoms don't get better after 1-2 days of taking antibiotics. Your symptoms go away and then come back. You have a fever or chills. You vomit or feel like you may vomit. Get help right away if: You have very bad pain in your back or lower belly. You faint. This information is not intended to replace advice given to you by your health care provider. Make sure you discuss any questions you have with your health care provider. Document Revised: 06/21/2023 Document Reviewed: 10/14/2022 Elsevier Patient Education  [employer].   If you have been instructed to have an in-person evaluation today at a local Urgent Care facility, please use the link below. It will take you to a list of all of our available [facility], including address, phone number and hours of operation. Please do not delay care.  [facility]  If you or a family member do not have a primary care provider, use the link below to schedule a visit and  establish care. When you choose a [facility] primary care physician or advanced practice provider, you gain a long-term partner in health. Find a Primary Care Provider  Learn more about [facility]'s in-office and virtual care options: [facility] - Get Care Now

## 2024-06-12 NOTE — Progress Notes (Signed)
 Virtual Visit Consent   [name], you are scheduled for a virtual visit with a [facility] provider today. Just as with appointments in the office, your consent must be obtained to participate. Your consent will be active for this visit and any virtual visit you may have with one of our providers in the next 365 days. If you have a MyChart account, a copy of this consent can be sent to you electronically.  As this is a virtual visit, video technology does not allow for your provider to perform a traditional examination. This may limit your provider's ability to fully assess your condition. If your provider identifies any concerns that need to be evaluated in person or the need to arrange testing (such as labs, EKG, etc.), we will make arrangements to do so. Although advances in technology are sophisticated, we cannot ensure that it will always work on either your end or our end. If the connection with a video visit is poor, the visit may have to be switched to a telephone visit. With either a video or telephone visit, we are not always able to ensure that we have a secure connection.  By engaging in this virtual visit, you consent to the provision of healthcare and authorize for your insurance to be billed (if applicable) for the services provided during this visit. Depending on your insurance coverage, you may receive a charge related to this service.  I need to obtain your verbal consent now. Are you willing to proceed with your visit today? [name] has provided verbal consent on 06/12/2024 for a virtual visit (video or telephone). Delon [name], PA-C  Date: 06/12/2024 6:42 PM   Virtual Visit via Video Note   I, Delon [name], connected with  TUJUANA [name]  ([id number], 09/06/1955) on 06/12/24 at  6:30 PM EST by a video-enabled telemedicine application and verified that I am speaking with the correct person using two identifiers.  Location: Patient: Virtual Visit Location  Patient: Home Provider: Virtual Visit Location Provider: Home Office   I discussed the limitations of evaluation and management by telemedicine and the availability of in person appointments. The patient expressed understanding and agreed to proceed.    History of Present Illness: Carol [name] is a 68 y.o. who identifies as a female who was assigned female at birth, and is being seen today for dysuria.  HPI: Urinary Tract Infection  This is a recurrent problem. Episode onset: Last treated with Macrobid  in 04/2024; Urine culture showed e.coli with some resistance developing. The problem occurs every urination. The problem has been gradually worsening. The quality of the pain is described as burning. The pain is mild. There has been no fever. She is Not sexually active. There is No history of pyelonephritis. Associated symptoms include frequency and urgency. Pertinent negatives include no chills, discharge, flank pain, hematuria, hesitancy, nausea or sweats. Associated symptoms comments: Strong odor, cloudy urine. She has tried increased fluids and NSAIDs (Motrin ) for the symptoms. The treatment provided no relief. Her past medical history is significant for recurrent UTIs.   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    Problems:  Patient Active Problem List   Diagnosis Date Noted   Pulmonary embolism (HCC) 12/23/2022   Left knee DJD 11/04/2022   TIA (transient ischemic attack) 12/12/2021   Hypothyroidism  12/12/2021   Anxiety 12/12/2021   Chronic low back pain 12/12/2021   Genetic testing 02/22/2021   Family history of prostate cancer 02/11/2021   Family history of breast cancer 02/11/2021   Ductal carcinoma in situ (DCIS) of right breast 02/08/2021   Exertional dyspnea 05/10/2019   Major depressive disorder, recurrent episode,  severe (HCC) 02/10/2016   Primary osteoarthritis of right knee 03/27/2015   Right knee DJD 03/27/2015   Diverticulosis of colon 06/26/2007    Allergies:  Allergies  Allergen Reactions   Methocarbamol  Other (See Comments) and Nausea Only    Other Reaction(s): edema, encephalitis   Acetaminophen  Nausea And Vomiting   Celebrex [Celecoxib] Itching   Povidone Iodine Hives    Topical    Povidone-Iodine Hives   Tape     Blisters    Codeine Rash   Doxycycline Rash   Medications:  Current Outpatient Medications:    cephALEXin  (KEFLEX ) 500 MG capsule, Take 1 capsule (500 mg total) by mouth 2 (two) times daily., Disp: 14 capsule, Rfl: 0   ALPRAZolam  (XANAX ) 0.25 MG tablet, Take by mouth., Disp: , Rfl:    aspirin  EC 81 MG tablet, Take 81 mg by mouth daily. Swallow whole., Disp: , Rfl:    Guaifenesin 1200 MG TB12, Take 1 tablet (1,200 mg total) by mouth in the morning and at bedtime., Disp: 14 tablet, Rfl: 0   levothyroxine  (SYNTHROID ) 100 MCG tablet, Take 100 mcg by mouth daily before breakfast., Disp: , Rfl:    oxyCODONE  (OXY IR/ROXICODONE ) 5 MG immediate release tablet, Take 1 tablet 4 times a day by oral route as needed for 30 days, for pain., Disp: , Rfl:    promethazine -dextromethorphan (PROMETHAZINE -DM) 6.25-15 MG/5ML syrup, Take 5 mLs by mouth at bedtime as needed for cough., Disp: 118 mL, Rfl: 0  Observations/Objective: Patient is well-developed, well-nourished in no acute distress.  Resting comfortably at home.  Head is normocephalic, atraumatic.  No labored breathing.  Speech is clear and coherent with logical content.  Patient is alert and oriented at baseline.    Assessment and Plan: 1. Suspected UTI (Primary) - cephALEXin  (KEFLEX ) 500 MG capsule; Take 1 capsule (500 mg total) by mouth 2 (two) times daily.  Dispense: 14 capsule; Refill: 0  - Worsening symptoms.  - Will treat empirically with Keflex  - May use AZO for bladder spasms - Continue to push fluids.  - Seek in  person evaluation for urine culture if symptoms do not improve or if they worsen.    Follow Up Instructions: I discussed the assessment and treatment plan with the patient. The patient was provided an opportunity to ask questions and all were answered. The patient agreed with the plan and demonstrated an understanding of the instructions.  A copy of instructions were sent to the patient via MyChart unless otherwise noted below.    The patient was advised to call back or seek an in-person evaluation if the symptoms worsen or if the condition fails to improve as anticipated.    Delon [name], PA-C

## 2024-07-09 ENCOUNTER — Telehealth: Admitting: Family Medicine

## 2024-07-09 ENCOUNTER — Telehealth

## 2024-07-09 DIAGNOSIS — B9689 Other specified bacterial agents as the cause of diseases classified elsewhere: Secondary | ICD-10-CM | POA: Diagnosis not present

## 2024-07-09 DIAGNOSIS — J208 Acute bronchitis due to other specified organisms: Secondary | ICD-10-CM | POA: Diagnosis not present

## 2024-07-09 MED ORDER — AZITHROMYCIN 250 MG PO TABS: ORAL_TABLET | ORAL | 0 refills | Status: AC

## 2024-07-09 MED ORDER — PROMETHAZINE-DM 6.25-15 MG/5ML PO SYRP: 5.0000 mL | ORAL_SOLUTION | Freq: Four times a day (QID) | ORAL | 0 refills | Status: DC | PRN

## 2024-07-09 NOTE — Progress Notes (Signed)
 Virtual Visit Consent   Carol [name], you are scheduled for a virtual visit with a [facility] provider today. Just as with appointments in the office, your consent must be obtained to participate. Your consent will be active for this visit and any virtual visit you may have with one of our providers in the next 365 days. If you have a MyChart account, a copy of this consent can be sent to you electronically.  As this is a virtual visit, video technology does not allow for your provider to perform a traditional examination. This may limit your provider's ability to fully assess your condition. If your provider identifies any concerns that need to be evaluated in person or the need to arrange testing (such as labs, EKG, etc.), we will make arrangements to do so. Although advances in technology are sophisticated, we cannot ensure that it will always work on either your end or our end. If the connection with a video visit is poor, the visit may have to be switched to a telephone visit. With either a video or telephone visit, we are not always able to ensure that we have a secure connection.  By engaging in this virtual visit, you consent to the provision of healthcare and authorize for your insurance to be billed (if applicable) for the services provided during this visit. Depending on your insurance coverage, you may receive a charge related to this service.  I need to obtain your verbal consent now. Are you willing to proceed with your visit today? Carol [name] has provided verbal consent on 07/09/2024 for a virtual visit (video or telephone). Carol CHRISTELLA Barefoot, NP  Date: 07/09/2024 1:58 PM   Virtual Visit via Video Note   I, Carol [name], connected with  Carol [name]  ([id number], 10/31/1955) on 07/09/2024 at  2:00 PM EST by a video-enabled telemedicine application and verified that I am speaking with the correct person using two identifiers.  Location: Patient: Virtual Visit Location Patient:  Home Provider: Virtual Visit Location Provider: Home Office   I discussed the limitations of evaluation and management by telemedicine and the availability of in person appointments. The patient expressed understanding and agreed to proceed.    History of Present Illness: Carol [name] is a 68 y.o. who identifies as a female who was assigned female at birth, and is being seen today for cough and congestion  Onset was  over the weekend- with increased congestion and chest congestion- mucus production that is green.  Associated symptoms are temp of 100.9, headache, and sore throat Modifying factors are motrion, zicam swabs  Denies chest pain, shortness of breath, nausea and vomiting, changes in bladder or bowel habits.   Exposure to sick contacts- known- with atypical PNA and sinus infection exposure- with granddaughter  Problems:  Patient Active Problem List   Diagnosis Date Noted   Pulmonary embolism (HCC) 12/23/2022   Left knee DJD 11/04/2022   TIA (transient ischemic attack) 12/12/2021   Hypothyroidism 12/12/2021   Anxiety 12/12/2021   Chronic low back pain 12/12/2021   Genetic testing 02/22/2021   Family history of prostate cancer 02/11/2021   Family history of breast cancer 02/11/2021   Ductal carcinoma in situ (DCIS) of right breast 02/08/2021   Exertional dyspnea 05/10/2019   Major depressive disorder, recurrent episode, severe (HCC) 02/10/2016   Primary osteoarthritis of right knee 03/27/2015   Right knee DJD 03/27/2015   Diverticulosis of colon 06/26/2007    Allergies: Allergies[1] Medications: Current Medications[2]  Observations/Objective: Patient is well-developed, well-nourished in no acute distress.  Resting comfortably  at home.  Head is normocephalic, atraumatic.  No labored breathing.  Speech is clear and coherent with logical content.  Patient is alert and oriented at baseline.    Assessment and Plan:  1. Acute bacterial bronchitis (Primary)  -  azithromycin  (ZITHROMAX ) 250 MG tablet; Take 2 tablets on day 1, then 1 tablet daily on days 2 through 5  Dispense: 6 tablet; Refill: 0 - promethazine -dextromethorphan (PROMETHAZINE -DM) 6.25-15 MG/5ML syrup; Take 5 mLs by mouth 4 (four) times daily as needed for cough.  Dispense: 118 mL; Refill: 0  - Take meds as prescribed - Rest voice - Use a cool mist humidifier especially during the winter months when heat dries out the air. - Use saline nose sprays frequently to help soothe nasal passages if they are drying out. - Stay hydrated by drinking plenty of fluids - Keep thermostat turn down low to prevent drying out which can cause a dry cough.  If you do not improve you will need a follow up visit in person.                Reviewed side effects, risks and benefits of medication.    Patient acknowledged agreement and understanding of the plan.   Past Medical, Surgical, Social History, Allergies, and Medications have been Reviewed.     Follow Up Instructions: I discussed the assessment and treatment plan with the patient. The patient was provided an opportunity to ask questions and all were answered. The patient agreed with the plan and demonstrated an understanding of the instructions.  A copy of instructions were sent to the patient via MyChart unless otherwise noted below.     The patient was advised to call back or seek an in-person evaluation if the symptoms worsen or if the condition fails to improve as anticipated.    Carol CHRISTELLA Barefoot, NP     [1]  Allergies Allergen Reactions   Methocarbamol  Other (See Comments) and Nausea Only    Other Reaction(s): edema, encephalitis   Acetaminophen  Nausea And Vomiting   Celebrex [Celecoxib] Itching   Povidone Iodine Hives    Topical    Povidone-Iodine Hives   Tape     Blisters    Codeine Rash   Doxycycline Rash  [2]  Current Outpatient Medications:    ALPRAZolam  (XANAX ) 0.25 MG tablet, Take by mouth., Disp: , Rfl:    aspirin   EC 81 MG tablet, Take 81 mg by mouth daily. Swallow whole., Disp: , Rfl:    cephALEXin  (KEFLEX ) 500 MG capsule, Take 1 capsule (500 mg total) by mouth 2 (two) times daily., Disp: 14 capsule, Rfl: 0   Guaifenesin  1200 MG TB12, Take 1 tablet (1,200 mg total) by mouth in the morning and at bedtime., Disp: 14 tablet, Rfl: 0   levothyroxine  (SYNTHROID ) 100 MCG tablet, Take 100 mcg by mouth daily before breakfast., Disp: , Rfl:    oxyCODONE  (OXY IR/ROXICODONE ) 5 MG immediate release tablet, Take 1 tablet 4 times a day by oral route as needed for 30 days, for pain., Disp: , Rfl:    promethazine -dextromethorphan (PROMETHAZINE -DM) 6.25-15 MG/5ML syrup, Take 5 mLs by mouth at bedtime as needed for cough., Disp: 118 mL, Rfl: 0

## 2024-07-09 NOTE — Patient Instructions (Signed)
 Carol [name], thank you for joining Chiquita CHRISTELLA Barefoot, NP for today's virtual visit.  While this provider is not your primary care provider (PCP), if your PCP is located in our provider database this encounter information will be shared with them immediately following your visit.   A [facility] MyChart account gives you access to today's visit and all your visits, tests, and labs performed at [facility]  click here if you don't have a [facility] MyChart account or go to [email or web address]  Consent: (Patient) Carol [name] provided verbal consent for this virtual visit at the beginning of the encounter.  Current Medications:  Current Outpatient Medications:    azithromycin  (ZITHROMAX ) 250 MG tablet, Take 2 tablets on day 1, then 1 tablet daily on days 2 through 5, Disp: 6 tablet, Rfl: 0   promethazine -dextromethorphan (PROMETHAZINE -DM) 6.25-15 MG/5ML syrup, Take 5 mLs by mouth 4 (four) times daily as needed for cough., Disp: 118 mL, Rfl: 0   ALPRAZolam  (XANAX ) 0.25 MG tablet, Take by mouth., Disp: , Rfl:    aspirin  EC 81 MG tablet, Take 81 mg by mouth daily. Swallow whole., Disp: , Rfl:    cephALEXin  (KEFLEX ) 500 MG capsule, Take 1 capsule (500 mg total) by mouth 2 (two) times daily., Disp: 14 capsule, Rfl: 0   Guaifenesin  1200 MG TB12, Take 1 tablet (1,200 mg total) by mouth in the morning and at bedtime., Disp: 14 tablet, Rfl: 0   levothyroxine  (SYNTHROID ) 100 MCG tablet, Take 100 mcg by mouth daily before breakfast., Disp: , Rfl:    oxyCODONE  (OXY IR/ROXICODONE ) 5 MG immediate release tablet, Take 1 tablet 4 times a day by oral route as needed for 30 days, for pain., Disp: , Rfl:    Medications ordered in this encounter:  Meds ordered this encounter  Medications   azithromycin  (ZITHROMAX ) 250 MG tablet    Sig: Take 2 tablets on day 1, then 1 tablet daily on days 2 through 5    Dispense:  6 tablet    Refill:  0    Supervising Provider:   LAMPTEY, PHILIP O [[id number]]    promethazine -dextromethorphan (PROMETHAZINE -DM) 6.25-15 MG/5ML syrup    Sig: Take 5 mLs by mouth 4 (four) times daily as needed for cough.    Dispense:  118 mL    Refill:  0    Supervising Provider:   BLAISE ALEENE KIDD [[id number]]     *If you need refills on other medications prior to your next appointment, please contact your pharmacy*  Follow-Up: Call back or seek an in-person evaluation if the symptoms worsen or if the condition fails to improve as anticipated.  [facility] [phone]  Other Instructions  - Take meds as prescribed - Rest voice - Use a cool mist humidifier especially during the winter months when heat dries out the air. - Use saline nose sprays frequently to help soothe nasal passages if they are drying out. - Stay hydrated by drinking plenty of fluids - Keep thermostat turn down low to prevent drying out which can cause a dry cough.  If you do not improve you will need a follow up visit in person.   If you have been instructed to have an in-person evaluation today at a local Urgent Care facility, please use the link below. It will take you to a list of all of our available [facility], including address, phone number and hours of operation. Please do not delay care.    Urgent Cares  If you or a family member do not have a primary care provider, use the link below to schedule a visit and establish care. When you choose a [facility] primary care physician or advanced practice provider, you gain a long-term partner in health. Find a Primary Care Provider  Learn more about [facility] in-office and virtual care options:  - Get Care Now

## 2024-07-30 NOTE — Addendum Note (Signed)
 Addended by: MOISHE SHAWN HERO on: 07/30/2024 07:49 AM   Modules accepted: Level of Service

## 2024-08-01 ENCOUNTER — Ambulatory Visit

## 2024-08-01 VITALS — BP 146/77 | HR 87 | Ht 66.5 in | Wt 226.4 lb

## 2024-08-01 DIAGNOSIS — Z8673 Personal history of transient ischemic attack (TIA), and cerebral infarction without residual deficits: Secondary | ICD-10-CM | POA: Diagnosis not present

## 2024-08-01 DIAGNOSIS — F332 Major depressive disorder, recurrent severe without psychotic features: Secondary | ICD-10-CM

## 2024-08-01 DIAGNOSIS — D751 Secondary polycythemia: Secondary | ICD-10-CM | POA: Insufficient documentation

## 2024-08-01 DIAGNOSIS — G8929 Other chronic pain: Secondary | ICD-10-CM

## 2024-08-01 DIAGNOSIS — G4733 Obstructive sleep apnea (adult) (pediatric): Secondary | ICD-10-CM

## 2024-08-01 DIAGNOSIS — I10 Essential (primary) hypertension: Secondary | ICD-10-CM

## 2024-08-01 DIAGNOSIS — E119 Type 2 diabetes mellitus without complications: Secondary | ICD-10-CM | POA: Insufficient documentation

## 2024-08-01 DIAGNOSIS — R7303 Prediabetes: Secondary | ICD-10-CM

## 2024-08-01 DIAGNOSIS — M545 Low back pain, unspecified: Secondary | ICD-10-CM

## 2024-08-01 DIAGNOSIS — F339 Major depressive disorder, recurrent, unspecified: Secondary | ICD-10-CM | POA: Diagnosis not present

## 2024-08-01 DIAGNOSIS — F419 Anxiety disorder, unspecified: Secondary | ICD-10-CM | POA: Diagnosis not present

## 2024-08-01 MED ORDER — DULOXETINE HCL 30 MG PO CPEP: ORAL_CAPSULE | ORAL | 0 refills | Status: AC

## 2024-08-01 NOTE — Progress Notes (Signed)
 "   New patient visit   Patient: Carol [name]   DOB: 1956-07-07   69 y.o. Female  MRN: [id number] Visit Date: 08/01/2024  Today's healthcare provider: Isaiah DELENA Pepper, MD   Chief Complaint  Patient presents with   New Patient (Initial Visit)    Patient is present to establish care and would like to discuss antidepressants. Reports she previously took Cymbalta  and tolerated well but titrated off of it and would like to go back on it as it worked better for her than other as well as it helped with her back. Would like to see about having Cpap ordered and she was diagnosed with OSA in October. Letter was received from [facility]   Subjective    Carol [name] is a 69 y.o. female who presents today as a new patient to establish care.   Discussed the use of AI scribe software for clinical note transcription with the patient, who gave verbal consent to proceed.  History of Present Illness Carol [name] is a 69 year old female who presents for medication management and follow-up care.  She seeks to restart Cymbalta  for her depression and anxiety, as it was previously effective. She has tried five different medications without success since discontinuing Cymbalta . Cymbalta  also alleviated her back pain. She experienced excessive sweating while on Cymbalta  but is not concerned about this side effect. She had a negative experience with sertraline, which caused diarrhea for a month, leading to its discontinuation.  She has a history of sleep apnea diagnosed in October, contributing to her sleep disturbances. She was supposed to be referred for CPAP equipment but never received them. She reports sleep disturbances and feeling constantly tired, and notes she stopped Cymbalta  prior to these issues.  She manages her son's household and cares for her grandchildren, which has been stressful. Her son had acute pancreatitis in October due to gallstones, requiring multiple hospitalizations and  procedures, including a stent placement. Assisting with his care and managing household responsibilities has impacted her sleep and stress levels.  She has a history of hypertension but reports normal blood pressure at home, with readings around 125/87 mmHg. She was previously prescribed valsartan, but it caused hypotension, leading to its discontinuation after a week. She regularly monitors her blood pressure at home.  She has a history of diabetes and was previously on Ozempic and Mounjaro , which helped her lose weight. She was taken off these medications and placed on a Libre sensor to monitor her blood sugar, but her insurance stopped covering it unless she was on insulin . Her last A1c was reportedly normal, but she is concerned about her blood sugar levels as they often exceeded 200 mg/dL when using the [place].  She experiences chronic back pain, which has worsened since October, with pain radiating down her leg. She has had five back surgeries and is under the care of a pain management doctor. She takes oxycodone , usually twice a day, to manage her pain but avoids taking it at night as it disrupts her sleep. Her dosage was recently increased from 5 mg to 10 mg; she reports increased pain.  She occasionally takes Xanax  for anxiety, particularly during stressful interactions with her son's ex-wife. She has 17 pills left from a prescription filled in December, indicating infrequent use. She prefers not to take Xanax  at night as it makes her feel unwell the next day.  She reports recurrent urinary tract infections every couple of months, characterized by strong-smelling and  cloudy urine, and abdominal pain. She is under the care of a specialist in [place] for this issue.   Past Medical History:  Diagnosis Date   Allergy    Anxiety    Arthritis    Balance problems    Cancer (HCC)    Chronic anxiety    Chronic depression    Chronic diarrhea    after gallbladder surgery   Chronic low back  pain    Chronic seasonal allergic rhinitis    Complication of anesthesia    pt states has difficulty with being put to sleep    Dysrhythmia    Exogenous obesity    EXTERNAL HEMORRHOIDS 06/26/2007   Annotation: and internal Qualifier: Diagnosis of  By: Jacqueline Riggs     Falls    Family history of adverse reaction to anesthesia    pts daughter has N&V   Family history of breast cancer    Family history of prostate cancer    Fatigue    Hemorrhoids    History of blood clots    History of blood transfusion    2006   History of bronchitis    History of chicken pox    History of frequent urinary tract infections    History of gallstones    History of jaundice    History of kidney stones    History of measles as a child    History of mumps as a child    Hypertension    Hypothyroidism    Insomnia    Night sweats    Personal history of radiation therapy    Pneumonia    hx of    Polycythemia    PONV (postoperative nausea and vomiting)    Poor sleep    chronic poor quality sleep wit snoring, sleep study in Nov 2013 showed mild central sleep apnea with hypoxia felt due to narcotics, no significant OSA or RLS   Sleep apnea    Stroke [facility])    Thyroid  disease    TIA (transient ischemic attack)    Tinnitus    Type II diabetes mellitus (HCC) 11/2016   Past Surgical History:  Procedure Laterality Date   ABDOMINAL HYSTERECTOMY     APPENDECTOMY  1972   BACK SURGERY     BREAST BIOPSY Right    years ago- unsure when   BREAST LUMPECTOMY Right 03/2021   BREAST LUMPECTOMY WITH RADIOACTIVE SEED LOCALIZATION Right 03/25/2021   Procedure: RIGHT BREAST LUMPECTOMY WITH RADIOACTIVE SEED LOCALIZATION X2;  Surgeon: Aron Shoulders, MD;  Location: [facility] OR;  Service: General;  Laterality: Right;   CHOLECYSTECTOMY  1987   COLONOSCOPY  11/2011   showed mild diverticulosis, internal hemorrhoids, and no polyps   ENDOSCOPY with esophageal dilatation  03/14/2016   FRACTURE SURGERY     JOINT  REPLACEMENT     KNEE ARTHROSCOPY Right 01/07/2016   Procedure: ARTHROSCOPY RIGHT KNEE, DEBRIDEMENT OF ARTHROFIBROSIS, AND EXAM UNDER ANESTHESIA;  Surgeon: Reyes Billing, MD;  Location: [facility];  Service: Orthopedics;  Laterality: Right;   KNEE ARTHROSCOPY Right 10/04/2017   Procedure: Right knee arthroscopy, evaluation under anesthesia, lysis of adhesions;  Surgeon: Billing Reyes, MD;  Location: [facility];  Service: Orthopedics;  Laterality: Right;  60 mins   KNEE SURGERY     L3/4  fusion  2004   L3/4 discectomy  2003   L4/5 discectomy  2001   L4/5 fusion  2002   left foot surgery      to repair break - 2002  left knee meniscectomy  2012   lumbar spine ESI  12/2017   menicus tear     bilat;    RE-EXCISION OF BREAST LUMPECTOMY Right 04/29/2021   Procedure: RE-EXCISION OF RIGHT BREAST LUMPECTOMY;  Surgeon: Aron Shoulders, MD;  Location: [facility] OR;  Service: General;  Laterality: Right;   right knee arthroscopy  12/2015   Dr. Duwayne   right knee meniscectomy  2012   SPINE SURGERY     TONSILLECTOMY     TOTAL KNEE ARTHROPLASTY Right 03/27/2015   Procedure: RIGHT TOTAL KNEE ARTHROPLASTY;  Surgeon: Reyes Duwayne, MD;  Location: [facility];  Service: Orthopedics;  Laterality: Right;   TOTAL KNEE ARTHROPLASTY Left 11/04/2022   Procedure: TOTAL KNEE ARTHROPLASTY;  Surgeon: Duwayne Reyes, MD;  Location: [facility];  Service: Orthopedics;  Laterality: Left;   total knee replacement Right 03/2015   VAGINAL HYSTERECTOMY  1996   with ovaries intact, accidental bladder laceration   WRIST SURGERY Right 11/2016   Dr. Camella   Family Status  Relation Name Status   Mother  Deceased   Father  Deceased   Mat Aunt  Deceased   Mat Uncle  Deceased   Pat Aunt  Deceased   Pat Uncle x3 Deceased   MGM  Deceased   MGF  Deceased   PGM  Deceased   PGF  Deceased   Daughter  Alive   Son  Alive   H Sister  Alive       paternal half-sister   Other  (Not Specified)   Other  Alive   Other  Alive   Neg Hx  (Not  Specified)  No partnership data on file   Family History  Problem Relation Age of Onset   Hypertension Mother    COPD Mother    Diabetes Mother    Heart attack Father 30   Heart disease Father    Prostate cancer Maternal Uncle 36   Myasthenia gravis Daughter    Depression Daughter    Other Son        esophageal dysmotility, [employer]   Diabetes type II Other    Other Other        premature cardiovascular disease   Breast cancer Other 60       bilateral, mother's first cousin   Prostate cancer Other        dx 93s, maternal cousin's son   Colon polyps Neg Hx    Crohn's disease Neg Hx    Esophageal cancer Neg Hx    Rectal cancer Neg Hx    Stomach cancer Neg Hx    Social History   Socioeconomic History   Marital status: Married    Spouse name: Development Worker, [employer]   Number of children: 2   Years of education: Not on file   Highest education level: 12th grade  Occupational History   Not on file  Tobacco Use   Smoking status: Never   Smokeless tobacco: Never  Vaping Use   Vaping status: Never Used  Substance and Sexual Activity   Alcohol  use: Never   Drug use: Never   Sexual activity: Not Currently    Birth control/protection: Surgical, None  Other Topics Concern   Not on file  Social History Narrative   Not on file   Social Drivers of Health   Tobacco Use: Low Risk (08/01/2024)   Patient History    Smoking Tobacco Use: Never    Smokeless Tobacco Use: Never    Passive Exposure: Not on  file  Financial Resource Strain: Low Risk (07/30/2024)   Overall Financial Resource Strain (CARDIA)    Difficulty of Paying Living Expenses: Not hard at all  Food Insecurity: No Food Insecurity (07/30/2024)   Epic    Worried About Programme Researcher, Broadcasting/film/video in the Last Year: Never true    Ran Out of Food in the Last Year: Never true  Transportation Needs: No Transportation Needs (07/30/2024)   Epic    Lack of Transportation (Medical): No    Lack of Transportation (Non-Medical): No   Physical Activity: Inactive (07/30/2024)   Exercise Vital Sign    Days of Exercise per Week: 0 days    Minutes of Exercise per Session: Not on file  Stress: Stress Concern Present (07/30/2024)   [employer] of Occupational Health - Occupational Stress Questionnaire    Feeling of Stress: Very much  Social Connections: Socially Integrated (07/30/2024)   Social Connection and Isolation Panel    Frequency of Communication with Friends and Family: Three times a week    Frequency of Social Gatherings with Friends and Family: More than three times a week    Attends Religious Services: More than 4 times per year    Active Member of Clubs or Organizations: Yes    Attends Banker Meetings: Never    Marital Status: Married  Depression (PHQ2-9): Medium Risk (08/01/2024)   Depression (PHQ2-9)    PHQ-2 Score: 9  Alcohol  Screen: Not on file  Housing: Low Risk (07/30/2024)   Epic    Unable to Pay for Housing in the Last Year: No    Number of Times Moved in the Last Year: 0    Homeless in the Last Year: No  Utilities: Not At Risk (11/04/2022)   [employer] Utilities    Threatened with loss of utilities: No  Health Literacy: Not on file   Show/hide medication list[1] Allergies[2]  Reviews of Systems as noted in HPI.      Objective    BP (!) 146/77 (BP Location: Left Arm, Patient Position: Sitting, Cuff Size: Large)   Pulse 87   Ht 5' 6.5 (1.689 m)   Wt 226 lb 6.4 oz (102.7 kg)   SpO2 100%   BMI 35.99 kg/m     Physical Exam Constitutional:      Appearance: Normal appearance.  HENT:     Head: Normocephalic and atraumatic.     Mouth/Throat:     Mouth: Mucous membranes are moist.  Eyes:     Pupils: Pupils are equal, round, and reactive to light.  Pulmonary:     Effort: Pulmonary effort is normal.  Skin:    General: Skin is warm.  Neurological:     General: No focal deficit present.     Mental Status: She is alert.     Depression Screen    08/01/2024    9:43 AM  05/20/2021    9:59 AM 05/20/2021    9:56 AM  PHQ 2/9 Scores  PHQ - 2 Score 2 2 1   PHQ- 9 Score 9 5       Data saved with a previous flowsheet row definition   No results found for any visits on 08/01/24.  Assessment & Plan      Problem List Items Addressed This Visit       Cardiovascular and Mediastinum   Hypertension     Respiratory   OSA (obstructive sleep apnea)     Other   Anxiety - Primary   Relevant Medications  DULoxetine  (CYMBALTA ) 30 MG capsule   Chronic low back pain   Relevant Medications   Oxycodone  HCl 10 MG TABS   DULoxetine  (CYMBALTA ) 30 MG capsule   Obesity, morbid (HCC)   Depression   Relevant Medications   DULoxetine  (CYMBALTA ) 30 MG capsule   History of TIA (transient ischemic attack)   Prediabetes   Relevant Orders   Hemoglobin A1c   Lipid panel   Assessment & Plan Anxiety Depression Chronic, uncontrolled. Previous effective treatment with Cymbalta . Tried several different SSRIs without improvement. Current use of Xanax  for acute anxiety. - Restart Cymbalta  at 30 mg daily for 7 days, then increase to 60 mg daily if tolerated. - Follow up in 1 month to assess response to Cymbalta . - Encouraged discontinuation of Xanax  as Cymbalta  is restarted.  Chronic low back pain Chronic low back pain, hx of multiple back surgeries. Managed with oxycodone , recently increased due to inadequate pain control. - Continue current oxycodone  regimen as prescribed by pain management specialist. - Monitor pain levels and adjust oxycodone  dosage as needed in consultation with pain management specialist.  Obstructive sleep apnea Per patient, recent sleep study showed OSA. Records are not available. Will request [facility] and order CPAP if appropriate.  Prediabetes Last A1c of 6.2. Previously told she had diabetes, however no A1c>6.5 noted in chart.  - Ordered A1c test to reassess glucose control.  Morbid Obesity (HCC) BMI 35 with comorbidity of HTN,  OSA, prediabetes. Discussed eating a balanced diet and incorporating movement into daily routine.   Hypertension Chronic, controlled based on home readings. Goal <130/80. Elevated in office today. Previously was taking valsartan 160mg  however stopped taking due to hypotension and dizziness. - Recommend home BP monitoring - Will start BP medication if elevated at next visit  History of TIA Taking aspirin , not currently taking a statin. Will check lipid panel and discuss initiating statin at next visit.  General health maintenance Due for Medicare annual wellness visit and physical examination. - Schedule Medicare annual wellness visit with nurse. - Plan for physical examination after follow-up on Cymbalta  response.     Return in about 4 weeks (around 08/29/2024) for Follow Up.      Isaiah DELENA Pepper, MD  [facility] [phone] (phone) [phone] (fax)     [1]  Outpatient Medications Prior to Visit  Medication Sig   ALPRAZolam  (XANAX ) 0.25 MG tablet Take by mouth.   aspirin  EC 81 MG tablet Take 81 mg by mouth daily. Swallow whole.   Continuous Glucose Sensor (FREESTYLE LIBRE 3 SENSOR) MISC USE AS DIRECTED TO MONITOR BLOOD SUGAR AT LEAST 3 TIMES PER DAY   levothyroxine  (SYNTHROID ) 100 MCG tablet Take 100 mcg by mouth daily before breakfast.   naloxone (NARCAN) nasal spray 4 mg/0.1 mL Place 1 spray into the nose once.   Oxycodone  HCl 10 MG TABS Take 10 mg by mouth 3 (three) times daily as needed.   [DISCONTINUED] ibuprofen  (MOTRIN  IB) 200 MG tablet Take 200 mg by mouth.   [DISCONTINUED] cephALEXin  (KEFLEX ) 500 MG capsule Take 1 capsule (500 mg total) by mouth 2 (two) times daily. (Patient not taking: Reported on 08/01/2024)   [DISCONTINUED] Guaifenesin  1200 MG TB12 Take 1 tablet (1,200 mg total) by mouth in the morning and at bedtime.   [DISCONTINUED] oxyCODONE  (OXY IR/ROXICODONE ) 5 MG immediate release tablet Take 1 tablet 4 times a day by oral route as  needed for 30 days, for pain. (Patient not taking: Reported on 08/01/2024)   [  DISCONTINUED] promethazine -dextromethorphan (PROMETHAZINE -DM) 6.25-15 MG/5ML syrup Take 5 mLs by mouth 4 (four) times daily as needed for cough. (Patient not taking: Reported on 08/01/2024)   [DISCONTINUED] valsartan (DIOVAN) 160 MG tablet Take 160 mg by mouth daily. (Patient not taking: Reported on 08/01/2024)   No facility-administered medications prior to visit.  [2]  Allergies Allergen Reactions   Other Dermatitis   Tape Dermatitis    Blisters   Methocarbamol  Nausea Only and Other (See Comments)    Other Reaction(s): edema, encephalitis  Other Reaction(s): encephalitis  methocarbamol    Acetaminophen  Nausea And Vomiting and Other (See Comments)    acetaminophen    Celebrex [Celecoxib] Itching   Povidone Iodine Hives    Topical    Povidone-Iodine Hives   Silicone Other (See Comments)    silicones   Codeine Rash and Dermatitis   Doxycycline Rash   "

## 2024-08-02 ENCOUNTER — Ambulatory Visit: Payer: Self-pay

## 2024-08-02 DIAGNOSIS — E782 Mixed hyperlipidemia: Secondary | ICD-10-CM

## 2024-08-02 DIAGNOSIS — R7303 Prediabetes: Secondary | ICD-10-CM

## 2024-08-02 LAB — LIPID PANEL
Chol/HDL Ratio: 4.3 ratio (ref 0.0–4.4)
Cholesterol, Total: 203 mg/dL — ABNORMAL HIGH (ref 100–199)
HDL: 47 mg/dL
LDL Chol Calc (NIH): 127 mg/dL — ABNORMAL HIGH (ref 0–99)
Triglycerides: 166 mg/dL — ABNORMAL HIGH (ref 0–149)
VLDL Cholesterol Cal: 29 mg/dL (ref 5–40)

## 2024-08-02 LAB — HEMOGLOBIN A1C
Est. average glucose Bld gHb Est-mCnc: 128 mg/dL
Hgb A1c MFr Bld: 6.1 % — ABNORMAL HIGH (ref 4.8–5.6)

## 2024-08-02 MED ORDER — METFORMIN HCL ER 500 MG PO TB24: 500.0000 mg | ORAL_TABLET | Freq: Every day | ORAL | 3 refills | Status: AC

## 2024-08-02 MED ORDER — ATORVASTATIN CALCIUM 40 MG PO TABS: 40.0000 mg | ORAL_TABLET | Freq: Every day | ORAL | 3 refills | Status: AC

## 2024-08-05 ENCOUNTER — Encounter: Payer: Self-pay | Admitting: *Deleted

## 2024-08-30 ENCOUNTER — Ambulatory Visit

## 2024-09-10 ENCOUNTER — Ambulatory Visit

## 2024-10-08 ENCOUNTER — Ambulatory Visit
